# Patient Record
Sex: Female | Born: 1935
Health system: Southern US, Community
[De-identification: ages and names within clinical notes are randomized; demographics above are authoritative.]

## PROBLEM LIST (undated history)

## (undated) DIAGNOSIS — J449 Chronic obstructive pulmonary disease, unspecified: Secondary | ICD-10-CM

## (undated) DIAGNOSIS — I10 Essential (primary) hypertension: Secondary | ICD-10-CM

## (undated) DIAGNOSIS — J398 Other specified diseases of upper respiratory tract: Secondary | ICD-10-CM

## (undated) DIAGNOSIS — G4733 Obstructive sleep apnea (adult) (pediatric): Secondary | ICD-10-CM

## (undated) DIAGNOSIS — D649 Anemia, unspecified: Secondary | ICD-10-CM

## (undated) DIAGNOSIS — E669 Obesity, unspecified: Secondary | ICD-10-CM

## (undated) DIAGNOSIS — I209 Angina pectoris, unspecified: Secondary | ICD-10-CM

## (undated) DIAGNOSIS — K219 Gastro-esophageal reflux disease without esophagitis: Secondary | ICD-10-CM

## (undated) DIAGNOSIS — R319 Hematuria, unspecified: Secondary | ICD-10-CM

## (undated) DIAGNOSIS — M1990 Unspecified osteoarthritis, unspecified site: Secondary | ICD-10-CM

## (undated) DIAGNOSIS — I495 Sick sinus syndrome: Secondary | ICD-10-CM

## (undated) DIAGNOSIS — Z9289 Personal history of other medical treatment: Secondary | ICD-10-CM

## (undated) DIAGNOSIS — I480 Paroxysmal atrial fibrillation: Secondary | ICD-10-CM

## (undated) DIAGNOSIS — R300 Dysuria: Secondary | ICD-10-CM

## (undated) DIAGNOSIS — I639 Cerebral infarction, unspecified: Secondary | ICD-10-CM

## (undated) HISTORY — DX: Sick sinus syndrome: I49.5

## (undated) HISTORY — DX: Cerebral infarction, unspecified: I63.9

## (undated) HISTORY — DX: Chronic obstructive pulmonary disease, unspecified: J44.9

## (undated) HISTORY — PX: ATRIAL ABLATION SURGERY: SHX560

## (undated) HISTORY — DX: Gastro-esophageal reflux disease without esophagitis: K21.9

## (undated) HISTORY — DX: Paroxysmal atrial fibrillation: I48.0

## (undated) HISTORY — DX: Essential (primary) hypertension: I10

## (undated) HISTORY — DX: Obstructive sleep apnea (adult) (pediatric): G47.33

## (undated) HISTORY — DX: Personal history of other medical treatment: Z92.89

## (undated) HISTORY — PX: KNEE ARTHROSCOPY: SUR90

## (undated) HISTORY — DX: Obesity, unspecified: E66.9

## (undated) HISTORY — PX: ABDOMINAL HYSTERECTOMY: SHX81

---

## 2001-05-15 ENCOUNTER — Other Ambulatory Visit: Admission: RE | Admit: 2001-05-15 | Discharge: 2001-05-15 | Payer: Self-pay | Admitting: Family Medicine

## 2004-03-03 ENCOUNTER — Encounter (INDEPENDENT_AMBULATORY_CARE_PROVIDER_SITE_OTHER): Payer: Self-pay | Admitting: Cardiology

## 2004-03-03 ENCOUNTER — Observation Stay (HOSPITAL_COMMUNITY): Admission: EM | Admit: 2004-03-03 | Discharge: 2004-03-04 | Payer: Self-pay | Admitting: Emergency Medicine

## 2007-04-10 ENCOUNTER — Inpatient Hospital Stay (HOSPITAL_BASED_OUTPATIENT_CLINIC_OR_DEPARTMENT_OTHER): Admission: RE | Admit: 2007-04-10 | Discharge: 2007-04-10 | Payer: Self-pay | Admitting: Interventional Cardiology

## 2007-04-25 ENCOUNTER — Encounter: Admission: RE | Admit: 2007-04-25 | Discharge: 2007-04-25 | Payer: Self-pay | Admitting: Interventional Cardiology

## 2007-08-05 DIAGNOSIS — I639 Cerebral infarction, unspecified: Secondary | ICD-10-CM

## 2007-08-05 HISTORY — DX: Cerebral infarction, unspecified: I63.9

## 2007-09-02 ENCOUNTER — Ambulatory Visit: Payer: Self-pay | Admitting: Pulmonary Disease

## 2007-09-02 ENCOUNTER — Inpatient Hospital Stay (HOSPITAL_COMMUNITY): Admission: EM | Admit: 2007-09-02 | Discharge: 2007-09-09 | Payer: Self-pay | Admitting: Emergency Medicine

## 2007-09-03 ENCOUNTER — Encounter (INDEPENDENT_AMBULATORY_CARE_PROVIDER_SITE_OTHER): Payer: Self-pay | Admitting: Interventional Cardiology

## 2007-09-05 ENCOUNTER — Ambulatory Visit: Payer: Self-pay | Admitting: Physical Medicine & Rehabilitation

## 2008-12-27 ENCOUNTER — Encounter: Payer: Self-pay | Admitting: Emergency Medicine

## 2008-12-27 ENCOUNTER — Ambulatory Visit: Payer: Self-pay | Admitting: Diagnostic Radiology

## 2008-12-28 ENCOUNTER — Inpatient Hospital Stay (HOSPITAL_COMMUNITY): Admission: EM | Admit: 2008-12-28 | Discharge: 2008-12-29 | Payer: Self-pay | Admitting: Interventional Cardiology

## 2008-12-28 ENCOUNTER — Ambulatory Visit: Payer: Self-pay | Admitting: Cardiology

## 2009-03-06 DIAGNOSIS — I209 Angina pectoris, unspecified: Secondary | ICD-10-CM

## 2009-03-06 HISTORY — DX: Angina pectoris, unspecified: I20.9

## 2009-03-19 ENCOUNTER — Emergency Department (HOSPITAL_BASED_OUTPATIENT_CLINIC_OR_DEPARTMENT_OTHER): Admission: EM | Admit: 2009-03-19 | Discharge: 2009-03-20 | Payer: Self-pay | Admitting: Emergency Medicine

## 2009-03-20 ENCOUNTER — Encounter: Payer: Self-pay | Admitting: Internal Medicine

## 2009-04-03 ENCOUNTER — Ambulatory Visit: Payer: Self-pay | Admitting: Radiology

## 2009-04-03 ENCOUNTER — Encounter: Payer: Self-pay | Admitting: Internal Medicine

## 2009-04-03 ENCOUNTER — Encounter: Payer: Self-pay | Admitting: Emergency Medicine

## 2009-04-04 ENCOUNTER — Ambulatory Visit: Payer: Self-pay | Admitting: Cardiology

## 2009-04-05 ENCOUNTER — Encounter (INDEPENDENT_AMBULATORY_CARE_PROVIDER_SITE_OTHER): Payer: Self-pay | Admitting: Interventional Cardiology

## 2009-04-07 ENCOUNTER — Encounter: Payer: Self-pay | Admitting: Internal Medicine

## 2009-04-12 ENCOUNTER — Ambulatory Visit: Payer: Self-pay | Admitting: Diagnostic Radiology

## 2009-04-12 ENCOUNTER — Emergency Department (HOSPITAL_BASED_OUTPATIENT_CLINIC_OR_DEPARTMENT_OTHER): Admission: EM | Admit: 2009-04-12 | Discharge: 2009-04-12 | Payer: Self-pay | Admitting: Emergency Medicine

## 2009-04-13 ENCOUNTER — Encounter: Payer: Self-pay | Admitting: Internal Medicine

## 2009-05-05 ENCOUNTER — Encounter: Payer: Self-pay | Admitting: Internal Medicine

## 2009-05-14 ENCOUNTER — Encounter: Payer: Self-pay | Admitting: Internal Medicine

## 2009-05-15 ENCOUNTER — Encounter: Payer: Self-pay | Admitting: Internal Medicine

## 2009-06-03 ENCOUNTER — Encounter: Payer: Self-pay | Admitting: Internal Medicine

## 2009-06-17 ENCOUNTER — Ambulatory Visit: Payer: Self-pay | Admitting: Internal Medicine

## 2009-06-17 DIAGNOSIS — G4733 Obstructive sleep apnea (adult) (pediatric): Secondary | ICD-10-CM

## 2009-06-17 DIAGNOSIS — I4891 Unspecified atrial fibrillation: Secondary | ICD-10-CM | POA: Insufficient documentation

## 2009-06-17 DIAGNOSIS — I10 Essential (primary) hypertension: Secondary | ICD-10-CM

## 2009-06-23 ENCOUNTER — Encounter (INDEPENDENT_AMBULATORY_CARE_PROVIDER_SITE_OTHER): Payer: Self-pay | Admitting: Pharmacist

## 2009-06-30 ENCOUNTER — Encounter: Payer: Self-pay | Admitting: Internal Medicine

## 2009-07-01 ENCOUNTER — Encounter: Payer: Self-pay | Admitting: Internal Medicine

## 2009-07-03 ENCOUNTER — Encounter: Payer: Self-pay | Admitting: Internal Medicine

## 2009-07-07 ENCOUNTER — Encounter (INDEPENDENT_AMBULATORY_CARE_PROVIDER_SITE_OTHER): Payer: Self-pay | Admitting: Pharmacist

## 2009-07-13 ENCOUNTER — Ambulatory Visit: Payer: Self-pay | Admitting: Diagnostic Radiology

## 2009-07-13 ENCOUNTER — Emergency Department (HOSPITAL_BASED_OUTPATIENT_CLINIC_OR_DEPARTMENT_OTHER): Admission: EM | Admit: 2009-07-13 | Discharge: 2009-07-13 | Payer: Self-pay | Admitting: Emergency Medicine

## 2009-07-14 ENCOUNTER — Encounter: Payer: Self-pay | Admitting: Internal Medicine

## 2009-07-27 ENCOUNTER — Encounter: Payer: Self-pay | Admitting: Internal Medicine

## 2009-07-28 ENCOUNTER — Ambulatory Visit (HOSPITAL_COMMUNITY): Admission: RE | Admit: 2009-07-28 | Discharge: 2009-07-28 | Payer: Self-pay | Admitting: Interventional Cardiology

## 2009-07-28 ENCOUNTER — Encounter (INDEPENDENT_AMBULATORY_CARE_PROVIDER_SITE_OTHER): Payer: Self-pay | Admitting: Interventional Cardiology

## 2009-07-29 ENCOUNTER — Ambulatory Visit (HOSPITAL_COMMUNITY): Admission: RE | Admit: 2009-07-29 | Discharge: 2009-07-30 | Payer: Self-pay | Admitting: Internal Medicine

## 2009-07-29 ENCOUNTER — Ambulatory Visit: Payer: Self-pay | Admitting: Internal Medicine

## 2009-08-05 ENCOUNTER — Encounter: Payer: Self-pay | Admitting: Internal Medicine

## 2009-08-09 ENCOUNTER — Telehealth: Payer: Self-pay | Admitting: Internal Medicine

## 2009-08-15 ENCOUNTER — Encounter: Payer: Self-pay | Admitting: Emergency Medicine

## 2009-08-15 ENCOUNTER — Ambulatory Visit: Payer: Self-pay | Admitting: Diagnostic Radiology

## 2009-08-16 ENCOUNTER — Inpatient Hospital Stay (HOSPITAL_COMMUNITY): Admission: EM | Admit: 2009-08-16 | Discharge: 2009-08-17 | Payer: Self-pay | Admitting: Internal Medicine

## 2009-08-16 ENCOUNTER — Encounter (INDEPENDENT_AMBULATORY_CARE_PROVIDER_SITE_OTHER): Payer: Self-pay | Admitting: Internal Medicine

## 2009-08-31 ENCOUNTER — Ambulatory Visit: Payer: Self-pay | Admitting: Internal Medicine

## 2009-08-31 ENCOUNTER — Inpatient Hospital Stay (HOSPITAL_COMMUNITY): Admission: EM | Admit: 2009-08-31 | Discharge: 2009-09-02 | Payer: Self-pay | Admitting: Emergency Medicine

## 2009-08-31 ENCOUNTER — Ambulatory Visit: Payer: Self-pay | Admitting: Pulmonary Disease

## 2009-09-13 ENCOUNTER — Ambulatory Visit: Payer: Self-pay | Admitting: Internal Medicine

## 2009-09-13 DIAGNOSIS — R0602 Shortness of breath: Secondary | ICD-10-CM | POA: Insufficient documentation

## 2009-09-27 ENCOUNTER — Ambulatory Visit (HOSPITAL_COMMUNITY)
Admission: RE | Admit: 2009-09-27 | Discharge: 2009-09-27 | Payer: Self-pay | Source: Home / Self Care | Admitting: Internal Medicine

## 2009-09-27 ENCOUNTER — Encounter: Payer: Self-pay | Admitting: Internal Medicine

## 2009-10-06 ENCOUNTER — Ambulatory Visit: Payer: Self-pay | Admitting: Internal Medicine

## 2009-10-14 ENCOUNTER — Ambulatory Visit: Payer: Self-pay | Admitting: Internal Medicine

## 2009-10-21 ENCOUNTER — Telehealth: Payer: Self-pay | Admitting: Internal Medicine

## 2009-11-18 ENCOUNTER — Ambulatory Visit: Payer: Self-pay | Admitting: Internal Medicine

## 2009-12-24 ENCOUNTER — Ambulatory Visit: Payer: Self-pay | Admitting: Diagnostic Radiology

## 2009-12-24 ENCOUNTER — Encounter: Payer: Self-pay | Admitting: Emergency Medicine

## 2009-12-25 ENCOUNTER — Ambulatory Visit: Payer: Self-pay | Admitting: Cardiology

## 2009-12-25 ENCOUNTER — Inpatient Hospital Stay (HOSPITAL_COMMUNITY): Admission: EM | Admit: 2009-12-25 | Discharge: 2009-12-26 | Payer: Self-pay | Admitting: Internal Medicine

## 2010-02-10 ENCOUNTER — Inpatient Hospital Stay (HOSPITAL_COMMUNITY): Admission: RE | Admit: 2010-02-10 | Discharge: 2009-04-07 | Payer: Self-pay | Admitting: Interventional Cardiology

## 2010-03-16 ENCOUNTER — Ambulatory Visit
Admission: RE | Admit: 2010-03-16 | Discharge: 2010-03-16 | Payer: Self-pay | Source: Home / Self Care | Attending: Orthopedic Surgery | Admitting: Orthopedic Surgery

## 2010-03-21 LAB — APTT: aPTT: 48 s — ABNORMAL HIGH (ref 24–37)

## 2010-03-21 LAB — BASIC METABOLIC PANEL WITH GFR
BUN: 9 mg/dL (ref 6–23)
CO2: 29 meq/L (ref 19–32)
Calcium: 8.9 mg/dL (ref 8.4–10.5)
Chloride: 95 meq/L — ABNORMAL LOW (ref 96–112)
Creatinine, Ser: 0.9 mg/dL (ref 0.4–1.2)
GFR calc Af Amer: >60 mL/min (ref >60)
GFR calc non Af Amer: >60 mL/min (ref >60)
Glucose, Bld: 79 mg/dL (ref 70–99)
Potassium: 3.7 meq/L (ref 3.5–5.1)
Sodium: 134 meq/L — ABNORMAL LOW (ref 135–145)

## 2010-03-21 LAB — PROTIME-INR
INR: 1.51 — ABNORMAL HIGH (ref 0.00–1.49)
Prothrombin Time: 18.4 s — ABNORMAL HIGH (ref 11.6–15.2)

## 2010-03-21 LAB — POCT HEMOGLOBIN-HEMACUE: Hemoglobin: 13.4 g/dL (ref 12.0–15.0)

## 2010-03-25 NOTE — Op Note (Signed)
  [name], Lynn Dunn                ACCOUNT NO.:  [id number]  MEDICAL RECORD NO.:  [id number]          PATIENT TYPE:  AMB  LOCATION:  DSC                          FACILITY:  [facility]  PHYSICIAN:  Alta Corning, M.D.   DATE OF BIRTH:  Nov 12, 1930  DATE OF PROCEDURE:  03/16/2010 DATE OF DISCHARGE:                              OPERATIVE REPORT   PREOPERATIVE DIAGNOSIS:  Medial meniscal tear.  POSTOPERATIVE DIAGNOSES: 1. Medial meniscal tear. 2. Lateral meniscal tear. 3. Chondromalacia medial and lateral compartments. 4. Chondromalacia patellofemoral joint.  PROCEDURE: 1. Partial medial and partial lateral meniscectomy with corresponding     debridement of medial and lateral compartments. 2. Chondroplasty of patellofemoral joint down to bleeding bone.  SURGEON:  Alta Corning, MD  ASSISTANT:  Gary Fleet, PA  ANESTHESIA:  General.  BRIEF HISTORY:  [name] is a 77 year old female with a long history of having had significant complaints of right knee pain.  MRI was obtained, which showed she had a medial meniscal tear with radial components and complex components.  After failure of conservative care, activity modification, injection therapy, and medications, she was ultimately taken to the operating room for right knee arthroscopy.  PROCEDURE:  The patient was taken to the operating room and after adequate anesthesia was obtained with general anesthetic, the patient was placed supine on the operating table.  The right leg was then prepped and draped in usual sterile fashion.  Following this, routine arthroscopic examination of the knee revealed there was significant chondromalacia of patellofemoral joint, it was debrided back to a smooth and stable rim down to bleeding bone.  Attention was turned to the medial compartment which had a complex posterior midbody horn meniscal tear.  This was debrided back to a smooth stable rim.  There was chondromalacia in the tibial plateau  and medial femoral condyle, which were debrided.  Attention was turned laterally where there was significant meniscal tear of the anterior horn lateral meniscus, which was debrided allowing access into the lateral compartment, some mild chondromalacia in the lateral compartment was debrided as well.  The knee was copiously and thoroughly lavaged, suctioned dry.  All subportals closed with bandage.  Sterile compressive dressing was applied.  The patient was taken to the recovery room and was noted to be in satisfactory condition.  Estimated blood loss for the procedure was none.     Alta Corning, M.D.     [name]  D:  03/16/2010  T:  03/17/2010  Job:  [id number]  Electronically Signed by Dorna Leitz M.D. on 03/24/2010 08:59:38 PM

## 2010-04-05 NOTE — Letter (Signed)
Summary: ELectrophysiology/Ablation Procedure Instructions  [place] [place]  [id number] [place]   [place], [place] [address]   Phone: [phone]  Fax: [phone]     Electrophysiology/Ablation Procedure Instructions    You are scheduled for a(n) a fib ablation on 07/29/09 at 7:30AM with Dr. Rayann Heman.  1.  Please come to the [facility] at 5:30AM on the day of your procedure.  2.  Come prepared to stay overnight.   Please bring your insurance cards and a list of your medications.  3.  Come to Dr. Patrici Ranks office on 07/22/09 for lab work.  You do not have to be fasting.  4.  Do not have anything to eat or drink after midnight the night before your procedure.  5.  Do NOT take these medications for the am of your procedure unless otherwise instructed:  Diltiazem.  All of your remaining medications may be taken with a small amount of water.  6.  Educational material received:  _____ Ablation   * Occasionally, EP studies and ablations can become lengthy.  Please make your family aware of this before your procedure starts.  Average time ranges from 2-8 hours for EP studies/ablations.  Your physician will locate your family after the procedure with the results.  * If you have any questions after you get home, please call the office at [phone].  Leonia Reader  Start having weekly INR's with Dr Maceo Pro next week and then all labs on 07/22/09  Dr Hassell Done office will call you to schedule TEE for 07/28/09

## 2010-04-05 NOTE — Letter (Signed)
[facility] Office Note  [facility] Office Note   Imported By: Sallee Provencal 06/23/2009 09:56:02  _____________________________________________________________________  External Attachment:    Type:   Image     Comment:   External Document

## 2010-04-05 NOTE — Progress Notes (Signed)
Summary: test results  Phone Note Call from Patient Call back at Home Phone [phone]   Caller: Patient Call For: ramaswamy Reason for Call: Lab or Test Results Summary of Call: Wants her test results. Initial call taken by: Netta Neat,  October 21, 2009 11:23 AM  Follow-up for Phone Call        Pt  advised per append of results and to stop cardizem. also placed a reminder for follow-up in 3 months.  [place] Bing CMA  October 21, 2009 3:02 PM

## 2010-04-05 NOTE — Progress Notes (Signed)
Summary: c/o swollen glands b/p issues  Phone Note Call from Patient Call back at Home Phone [phone]   Caller: Patient Reason for Call: Talk to Nurse Summary of Call: c/o glands swollen, hig b/p on yesterday 180/75 . ablation 5/26. Initial call taken by: Neil Crouch,  August 09, 2009 8:53 AM  Follow-up for Phone Call        has swollen glands in neck and groin.  The area in the groin is 3 inches above where the cath site is for the ablation.  Call Dr Valente David, RN, BSN  August 09, 2009 11:02 AM Follow-up by: Thompson Grayer, MD,  August 09, 2009 5:10 PM  Additional Follow-up for Phone Call Additional follow up Details #1::        she likely has a small hematoma at the cath site. This should gradually resolve. If she has worsening swelling/ pain at the groin site, then she should contact our office. Additional Follow-up by: Thompson Grayer, MD,  August 09, 2009 5:10 PM    Additional Follow-up for Phone Call Additional follow up Details #2::    keep an eye on area in groin and call me back if it does not resolve.  Saw NP at Dr Patrici Ranks office gave her an antibiotic for swollen glands in neck Theodosia Quay, RN, BSN  August 09, 2009 5:53 PM

## 2010-04-05 NOTE — Assessment & Plan Note (Signed)
Summary: NEP/ AFIB POSSIBLE ABLATION . PT HAS MEDICARE/ GD   Visit Type:  Initial Consult Referring Provider:  Dr Irish Lack Primary Provider:  Dr Maceo Pro  CC:  nep/afib possible ablation. Pt states she is feeling so/so. Pt states she is out of breath all the time and her BP is up and down.Marland Kitchen  History of Present Illness: Lynn Dunn is a pleasant 77 yo WF with a h/o paroxysmal atrial fibrillation who presents today for EP consultation.  She reports intially being diagnosed with atrial fibrillation 5 years ago.  She reports abrupt onset of "heart racing" with chest tightness and SOB.  Her symptoms persisted and she was taken to the ER where she was documented to have afib 03/03/2004.  She has tried atenolol and diltiazem.  She reports increasing frequency and duration of atrial fibrillation despite medical therapy.  She has been hospitalized 5 times for afib, including an embolic stroke [id number] which required thrombolytics.  She has subsequently been treated with coumadin for stroke prevention.  During her most recent hospitalization 1/11, she was initated on amiodarone.  She has had no further afib, but is felt to have worsening COPD.  She therefore wishes to stop amiodarone at this time.  Current Medications (verified): 1)  Atenolol-Chlorthalidone 50-25 Mg Tabs (Atenolol-Chlorthalidone) .... Take One Tablet Once Daily 2)  Aspirin 81 Mg Tabs (Aspirin) .... Once Daily 3)  Lansoprazole 30 Mg Cpdr (Lansoprazole) .... Take One Capsule Daily 4)  Benicar 20 Mg Tabs (Olmesartan Medoxomil) .... One Tab Daily 5)  Pravastatin Sodium 80 Mg Tabs (Pravastatin Sodium) .... Take One Tablet Once Daily 6)  Warfarin Sodium 4 Mg Tabs (Warfarin Sodium) .... Uad Followed By Dr Maceo Pro 7)  Coq-10 200 Mg Caps (Coenzyme Q10) .... Once Daily 8)  Diltiazem Hcl 120 Mg Tabs (Diltiazem Hcl) .... Take One Tablet Once Daily 9)  Albuterol Sulfate (2.5 Mg/26ml) 0.083% Nebu (Albuterol Sulfate) .... Use 3 Times Daily 10)  Pulmicort 0.25  Mg/52ml Susp (Budesonide) .... Use 2 Times Daily  Allergies (verified): 1)  ! Codeine  Past History:  Past Medical History:  Hypertension  Paroxysmal Atrial fibrillation   mild COPD  Gastroesophageal reflux disease  Obesity  Embolic stroke [id number] requiring thrombolytics  OSA on CPAP  denies h/o rheumatic fever  Past Surgical History: Hysterectomy 1976  Family History: Noncontributory for early coronary artery disease or other cardiovascular disease.  Breast Cancer and brain cancer in her family  Social History: The patient is married and lives in [place].  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  She has never smoked cigarettes.   No recent alcohol.  Denies drugs. Attends [employer].  Review of Systems       All systems are reviewed and negative except as listed in the HPI.  ALso chronic dry cough, + constipation,   Vital Signs:  Patient profile:   77 year old female Height:      62.5 inches Weight:      195 pounds BMI:     35.22 Pulse rate:   67 / minute Pulse rhythm:   regular BP sitting:   124 / 62  (left arm) Cuff size:   large  Vitals Entered By: Doug Sou CMA (June 17, 2009 10:17 AM)  Physical Exam  General:  overweight, NAD Head:  normocephalic and atraumatic Eyes:  PERRLA/EOM intact; conjunctiva and lids normal. Mouth:  Teeth, gums and palate normal. Oral mucosa normal. Neck:  Neck supple,  no JVD. No masses, thyromegaly or abnormal cervical nodes. Lungs:  Clear bilaterally to auscultation and percussion. Heart:  Non-displaced PMI, chest non-tender; regular rate and rhythm, S1, S2 without murmurs, rubs or gallops. Carotid upstroke normal, no bruit. Normal abdominal aortic size, no bruits. Femorals normal pulses, no bruits. Pedals normal pulses. No edema, no varicosities. Abdomen:  Bowel sounds positive; abdomen soft and non-tender without masses, organomegaly, or hernias noted. No  hepatosplenomegaly. Msk:  Back normal, normal gait. Muscle strength and tone normal. Pulses:  pulses normal in all 4 extremities Extremities:  No clubbing or cyanosis. Neurologic:  Alert and oriented x 3.  strength/sensation intact Skin:  Intact without lesions or rashes. Cervical Nodes:  no significant adenopathy Psych:  Normal affect.   Cardiac Cath  Procedure date:  04/10/2007  Findings:       IMPRESSION:   1. No significant coronary artery disease.   2. Essentially normal right heart pressures.   3. Normal left ventricular end diastolic pressure and ejection       fraction estimated at 55% with no regional wall motion       abnormalities.      RECOMMENDATIONS:  Continue aggressive medical therapy for high blood   pressure in this patient.  Will consider pulmonary function testing to   further evaluate her shortness of breath.               Jettie Booze, MD   CXR  Procedure date:  04/12/2009  Findings:       Findings: The lungs are relatively well-aerated and clear.  There   is no evidence of focal opacification, pleural effusion or   pneumothorax.    The heart is enlarged but stable in size.  Pulmonary vascularity is   at the upper limits of normal.  No acute osseous abnormalities are   seen.    IMPRESSION:   No acute cardiopulmonary process seen; stable cardiomegaly.    Read By:  Santa Lighter,  M.D.  Nuclear Study  Procedure date:  04/07/2009  Findings:       Findings:  Rest images demonstrate normal left ventricular cavity   size.    Stress images demonstrate no fixed or reversible defects to suggest   inducible ischemia.    Evaluation of wall motion demonstrates normal left ventricular wall   motion and thickening.    Ejection fraction is estimated at 64%.  End diastolic volume of 79   cc.  End systolic volume of  28 cc.    IMPRESSION:   1.  No fixed or reversible defects to suggest inducible ischemia.   2.  Normal left ventricular  wall motion and thickening.   3.  Normal ejection fraction.    Read By:  Areta Haber,  M.D.  CXR  Procedure date:  04/03/2009  Findings:       Findings: The heart is enlarged.  There are no infiltrates or   failure.  Bones unremarkable.  No hilar or mediastinal   abnormalities.    IMPRESSION:   No active disease and no change.  Cardiomegaly.    Read By:  Staci Righter,  M.D.  Echocardiogram  Procedure date:  04/05/2009  Findings:        Study Conclusions    - Left ventricle: The cavity size was normal. Systolic function was     normal. The estimated ejection fraction was in the range of 55% to     60%. Wall motion was normal; there  were no regional wall motion     abnormalities. Doppler parameters are consistent with abnormal     left ventricular relaxation (grade 1 diastolic dysfunction).     Doppler parameters are consistent with elevated mean left atrial     filling pressure.   - Aortic valve: Mild regurgitation.   - Mitral valve: Moderately calcified annulus. Mildly thickened     leaflets .   - Left atrium: The atrium was mildly dilated.   - Right ventricle: The cavity size was mildly dilated.   Impressions:    - When compared to prior echo 08/2007 - aortic regurgitation is     unchanged, EF is unchanged, MAC is unchanged. Left atrial size is     increased (3.4 to 4.6cm).   Transthoracic echocardiography. M-mode, complete 2D, spectral   Doppler, and color Doppler. Height: Height: 157.5cm. Height: 62in.   Weight: Weight: 87.2kg. Weight: 191.8lb. Body mass index: BMI:   35.2kg/m^2. Body surface area: BSA: 1.71m^2. Blood pressure: 140/75.   Patient status: Inpatient. Location: Echo laboratory.  Echocardiogram  Procedure date:  09/03/2007  Findings:        SUMMARY   -  Overall left ventricular systolic function was normal. Left         ventricular ejection fraction was estimated , range being 60         % to 65 %. There was no diagnostic evidence of left          ventricular regional wall motion abnormalities.   -  The aortic valve was mildly calcified. There was mildly reduced         aortic valve leaflet excursion. Findings were consistent with         mild aortic valve stenosis. There was mild aortic valvular         regurgitation. The mean transaortic valve gradient was 12         mmHg.   -  There was moderate mitral annular calcification, predominantly         involving the posterior leaflet.   -  The estimated peak pulmonary artery systolic pressure was mildly         increased.     ---------------------------------------------------------------    Prepared and Electronically Authenticated by    Casandra Doffing MD     EKG  Procedure date:  06/17/2009  Findings:      sinus rhythm 67 bpm, PR 208, QT 450  EKG  Procedure date:  03/20/2009  Findings:      afib, V rate 110  Impression & Recommendations:  Problem # 1:  ATRIAL FIBRILLATION (ICD-427.31) Pt has symptomatic atrial fibrillation.  She has failed medical therapy with diltiazem, atenolol, and amidarone.  Amiodarone was stopped due to COPD.  Therapeutic strategies for afib including medicine and ablation were discussed in detail with the patient today. Risk, benefits, and alternatives to EP study and radiofrequency ablation for afib were also discussed in detail today. These risks include but are not limited to stroke, bleeding, vascular damage, tamponade, perforation, damage to the esophagus, lungs, and other structures, pulmonary vein stenosis, worsening renal function, and death. The patient understands these risk and wishes to proceed.  The patient will be scheduled for afib ablation at the next available time.  She should continue coumadin for CHADS2 score of 4. We may consider multaq down the road if necessary.  Problem # 2:  ESSENTIAL HYPERTENSION, BENIGN (ICD-401.1) the importance of close BP control was discussed with pt today  Her updated medication list for  this problem includes:    Atenolol-chlorthalidone 50-25 Mg Tabs (Atenolol-chlorthalidone) .Marland Kitchen... Take one tablet once daily    Aspirin 81 Mg Tabs (Aspirin) ..... Once daily    Benicar 20 Mg Tabs (Olmesartan medoxomil) ..... One tab daily    Diltiazem Hcl 120 Mg Tabs (Diltiazem hcl) .Marland Kitchen... Take one tablet once daily  Problem # 3:  SLEEP APNEA (ICD-780.57) CPAP and weight loss encouraged

## 2010-04-05 NOTE — Letter (Signed)
Summary: CPST [employer] [facility] account executive Pulmonary  [place], [place] [address]   Phone: [phone]  Fax: [phone]     Patient's Name: Lynn Dunn Date of Birth: [birth year] MRN: [id number]  CPST  Choose test method and choice  a)_x__Bike - recommended by ATS/ACCP. Do at [facility] at Dr. Haroldine Laws [facility] - GET ABG pre and peak exercise. Please call Respo therapy [phone] prior to test b)___Treadmill - less preferred. Do at [facility] at Dr. Haroldine Laws lab or do at [facility] PFT lab  Choose one or more indication for test  INDICATIONS FOR CARDIOPULMONARY EXERCISE TESTING Evaluation of exercise tolerance ___x___ Determination of functional impairment or capacity (peak V? O2) __x____ Determination of exercise-limiting factors and pathophysiologic mechanisms  Evaluation of undiagnosed exercise intolerance _____ Assessing contribution of cardiac and pulmonary etiology in coexisting disease _____ Symptoms disproportionate to resting pulmonary and cardiac tests  _____Unexplained dyspnea when initial cardiopulmonary testing is nondiagnostic  Evaluation of patients with cardiovascular disease _____ Functional evaluation and prognosis in patients with heart failure _____ Selection for cardiac transplantation _____ Exercise prescription and monitoring response to exercise training for cardiac rehabilitation (special circumstances; i.e., pacemakers)  Evaluation of patients with respiratory disease _____ Functional impairment assessment (see specific clinical applications)  _____Chronic obstructive pulmonary disease Establishing exercise limitation(s) and assessing other potential contributing factors, especially occult heart disease (ischemia) ______Determination of magnitude of hypoxemia and for O2 prescription When objective determination of therapeutic intervention is necessary and not adequately addressed by standard pulmonary function testing  _____ Interstitial lung  diseases _____Detection of early (occult) gas exchange abnormalities _____Overall assessment/monitoring of pulmonary gas exchange _____Determination of magnitude of hypoxemia and for O2 prescription _____Determination of potential exercise-limiting factors _____Documentation of therapeutic response to potentially toxic therapy  ____ Pulmonary vascular disease (careful risk-benefit analysis required)  ____ Cystic fibrosis  ____ Exercise-induced bronchospasm  Specific clinical applications ____  Preoperative evaluation _____Lung resectional surgery _____Elderly patients undergoing major abdominal surgery _____Lung volume resectional surgery for emphysema (currently investigational)  ____ Exercise evaluation and prescription for pulmonary rehabilitation  ____ Evaluation for impairment-disability  ____ Evaluation for lung, heart-lung transplantation ____ Definition of abbreviation: V? O2______ -oxygen consumption.    Brand Males MD    [facility]

## 2010-04-05 NOTE — Letter (Signed)
Summary: CPST- R/O Contraindications  [facility]  [address], [place] [address]   Phone: [phone]  Fax: [phone]    Patient's Name: Lynn Dunn Date of Birth: Dec 13, 1930 MRN: [id number]  *********Rule out Contraindications**************** Absolute                                                                                                                           ___ Acute MI (3-5 Days)                                 ___ Unstable Angina                                          ___ Uncontrolled arrhythmias causing symptoms or hemodynamic compromise. ___ Syncope                                                     ___ Active endocarditis                                         ___ Acute Myocarditis/Pericarditis                        ___ Symptomatic severe aortic stenosis  ___ Acute Pulmonary embolus or pulmonary infarction                ___ Uncontrolled Heart Failure  ___ Thrombosis of lower extremitie ___ Suspected dissecting aneurysm ___ Uncontrolled Asthma                          ___ Pulmonary Edema                                        ___ RA desat @ rest<85%                                      ___ Repiratory Failure                                         ___ Acute noncardiopulmonary disorder that may affect exercise performance or be         aggravated by exercise (ie infection, renal failure,  thyrotoxicosis) .                               ___ Mental impairment leading to inabliity to cooperate   Relative ___ Left main coronary stenosis or its equivalent ___ Moderate stentoic valvular heart disease ___ Severe untreated arterial hypertension @ rest (<200 mmHg             [id number] Diastolic ___ Tachy/Brady Arrhythmias ___ High- degree artioventricular block ___ Hypertrophic cardiomyopathy ___ Significant pulmonary hypertension ___ Advanced or complicated pregnancy ___ Electrolyte abnormalities ___ Orthopedic impairment that  compromises exercise performance   Has had admites for hypertensive urgency and is s/p ablation for A Fib. Currently no contraindiation for CPST Brand Males MD    [facility]  Appended Document: CPST- R/O Contraindications Appt scheduled for 09/27/09 at 11:30 at [facility]. Pt aware of appt. and instructions.

## 2010-04-05 NOTE — Assessment & Plan Note (Signed)
Summary: HFU PER KATY- PT SAW YACOUB-//kp   Visit Type:  Hospital Follow-up Copy to:  Dr Irish Lack Primary Provider/Referring Provider:  Dr Maceo Pro - PMD, Dr Irish Lack - cards, Dr. Radford Pax - sleep,Dr. Chase Caller - Pulmonary, Dr. Lovena Le - EP  CC:  Pt here for hospital follow up.  History of Present Illness: IOV 09/13/2009: 77 year old obese female with OSA on CPAP (Dr Fransico Him), passive smoke exposure, with multiple admits since May 2011 (see spl past med hx). c/o Dyspnea. Insidious onset few years ago. Dyspnea started after she sustained a stroke but since has recovered from stroke. Since then very slowly progressive. Dyspnea brought on by exertional actiivties like walking, making bed, vacuming or when it is humid. Dyspnea relieved by rest. Dyspnea not made worse by pollen, dust. Does admit to associated mild intermittent wheezing for past 8-9 months. Denies associated chest pain, or change in chronic pedal edema, or change in usage of 2 pillow, paroxysmal nocturnal dyspnea. Denies diagnosis of asthma.   Of note, she admits to 20# weight in last 2 years and being on amidarone for 5-6 weeks in early part of 2011. She was  seen by Dr. Nelda Marseille of pulmonary in one of her June admissions for cardiac issues. Had CT chest in late June 2011 that showed some basal atelectasiss only. A spirometry done 04/05/2009 was reviewed by Dr. Nelda Marseille presumably. It showd reduced FVC as the only abnormality. DLCO was normal. Dyspnea was deemed secondary to obesity and deconditioning. She has now been referred here for further workup/management  Preventive Screening-Counseling & Management  Alcohol-Tobacco     Smoking Status: never     Smoking Cessation Counseling: no     Passive Smoke Exposure: yes     Tobacco Counseling: not indicated; no tobacco use     Passive Smoke Counseling: not indicated; no passive smoke exposure  Comments: parents smoked unfiltered cigs - exposed age [age] to age 26. And then husband smoked an  occ. pipe. She was only rarely exposted to this from age 81 through age 40.   Current Medications (verified): 1)  Metoprolol Tartrate 50 Mg Tabs (Metoprolol Tartrate) .... Take 1 Tablet By Mouth Two Times A Day 2)  Aspirin 81 Mg Tabs (Aspirin) .... Once Daily 3)  Lansoprazole 30 Mg Cpdr (Lansoprazole) .... Take One Capsule Daily 4)  Benicar 20 Mg Tabs (Olmesartan Medoxomil) .... One Tab Daily 5)  Pravastatin Sodium 80 Mg Tabs (Pravastatin Sodium) .... Take One Tablet Once Daily 6)  Warfarin Sodium 4 Mg Tabs (Warfarin Sodium) .... Uad Followed By Dr Maceo Pro 7)  Coq-10 200 Mg Caps (Coenzyme Q10) .... Once Daily 8)  Diltiazem Hcl 120 Mg Tabs (Diltiazem Hcl) .... Take 2 Tablet By Mouth Once A Day 9)  Albuterol Sulfate (2.5 Mg/40ml) 0.083% Nebu (Albuterol Sulfate) .... Use 1 Times Daily 10)  Pulmicort 0.25 Mg/78ml Susp (Budesonide) .... Use 2 Times Daily 11)  K-Tabs 10 Meq Cr-Tabs (Potassium Chloride) .... Take 1 Tablet By Mouth Once A Day 12)  Omega-3 Krill Oil 300 Mg Caps (Krill Oil) .... Take 1 Capsule By Mouth Once A Day 13)  Spiriva Handihaler 18 Mcg Caps (Tiotropium Bromide Monohydrate) .... Inhale Contents of One Capsule By Mouth Once Daily 14)  Furosemide 40 Mg Tabs (Furosemide) .... Take 1 Tablet By Mouth Once A Day 15)  Clonidine Hcl 0.1 Mg Tabs (Clonidine Hcl) .... As Needed 16)  Temazepam 15 Mg Caps (Temazepam) .... Take 1 Tab By Mouth At Bedtime As Needed  Allergies (  verified): 1)  ! Codeine  Past History:  Family History: Last updated: 06/17/2009 Noncontributory for early coronary artery disease or other cardiovascular disease.  Breast Cancer and brain cancer in her family  Social History: Last updated: 09/13/2009 The patient is married and lives in [place].  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  She has never smoked cigarettes.   No recent alcohol.  Denies drugs. Attends [employer]. Patient never smoked.   Risk  Factors: Smoking Status: never (09/13/2009) Passive Smoke Exposure: yes (09/13/2009)  Past Medical History: Reviewed history from 06/17/2009 and no changes required.  Hypertension  Paroxysmal Atrial fibrillation   mild COPD  Gastroesophageal reflux disease  Obesity  Embolic stroke [id number] requiring thrombolytics  OSA on CPAP  denies h/o rheumatic fever  Past Surgical History: Reviewed history from 06/17/2009 and no changes required. Hysterectomy 1976  Past Pulmonary History:  Pulmonary History: 3DATE OF ADMISSION:  08/31/2009   DATE OF DISCHARGE:  09/02/2009                               FINAL DIAGNOSES:   1. Atrial fibrillation.   2. Chronic Coumadin use.   3. Obesity.   4. Hypertension.   # DATE OF ADMISSION:  08/16/2009   DATE OF DISCHARGE:  08/17/2009        FINAL DISCHARGE DIAGNOSES:   1. Hypertensive urgency.   2. Chest tightness with negative cardiac enzymes.   3. Atrial fibrillation status post ablation.   4. History of cerebrovascular accident.   5. Hyperkalemia, resolved.   # DATE OF ADMISSION:  07/29/2009   DATE OF DISCHARGE:  07/30/2009                PROCEDURES:   1. Comprehensive EP study.   2. Coronary sinus pacing and recording.   3. A 3-D mapping of SVT.   4. Radiofrequency ablation of SVT.   5. Arterial blood pressure monitoring.   6. Intracardiac echocardiography.   7. Transseptal puncture.   8. Pulmonary vein venography.   9. Isoproterenol infusion.   10.Arrhythmia induction with pacing.      PRIMARY FINAL DISCHARGE DIAGNOSIS:  Paroxysmal atrial fibrillation.      SECONDARY DIAGNOSES:   1. Hyponatremia, sodium 130 prior to admission.   2. Dyslipidemia.   3. Hypertension.   4. Obesity.   5. Preserved left ventricular function with an ejection fraction of 55-       123456, grade 1 diastolic dysfunction, mild aortic regurgitation and       trivial mitral regurgitation by echocardiogram in February 2011.   6. Status post Myoview in February  2011, showing no scar or ischemia       and an ejection fraction of 64%.   7. Possible mild chronic obstructive pulmonary disease.   8. Gastroesophageal reflux disease.   9. Status post hysterectomy.   10.Allergy or intolerance to CODEINE.   11.Status post cardiac catheterization in 2009, for chest tightness       and dyspnea, showing no significant coronary artery disease and       essentially normal right heart pressures.   12.History of cerebrovascular accident status post tPA.   13.Status post tonsillectomy     Family History: Reviewed history from 06/17/2009 and no changes required. Noncontributory for early coronary artery disease or other cardiovascular disease.  Breast Cancer and brain cancer in her family  Social History: Reviewed history from 06/17/2009 and no changes required. The patient is married and lives in [place].  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  She has never smoked cigarettes.   No recent alcohol.  Denies drugs. Attends [employer]. Patient never smoked.  Smoking Status:  never Passive Smoke Exposure:  yes  Review of Systems       The patient complains of shortness of breath with activity, non-productive cough, chest pain, irregular heartbeats, headaches, hand/feet swelling, and joint stiffness or pain.  The patient denies shortness of breath at rest, productive cough, coughing up blood, acid heartburn, indigestion, loss of appetite, weight change, abdominal pain, difficulty swallowing, sore throat, tooth/dental problems, nasal congestion/difficulty breathing through nose, sneezing, itching, ear ache, anxiety, depression, rash, change in color of mucus, and fever.    Vital Signs:  Patient profile:   77 year old female Height:      62.5 inches Weight:      186.6 pounds BMI:     33.71 O2 Sat:      95 % on Room air Temp:     98.1 degrees F oral Pulse rate:   69 / minute BP sitting:   100 / 60  (left  arm) Cuff size:   large  Vitals Entered By: Iran Planas CMA (September 13, 2009 1:35 PM)  O2 Flow:  Room air CC: Pt here for hospital follow up Comments Medications reviewed with patient Verified contact number and pharmacy with patient Iran Planas CMA  September 13, 2009 1:35 PM    Physical Exam  General:  overweight, NAD Head:  normocephalic and atraumatic Eyes:  PERRLA/EOM intact; conjunctiva and lids normal. Mouth:  Teeth, gums and palate normal. Oral mucosa normal. Neck:  Neck supple, no JVD. No masses, thyromegaly or abnormal cervical nodes. Lungs:  Clear bilaterally to auscultation and percussion. Heart:  Non-displaced PMI, chest non-tender; regular rate and rhythm, S1, S2 without murmurs, rubs or gallops. Carotid upstroke normal, no bruit. Normal abdominal aortic size, no bruits. Femorals normal pulses, no bruits. Pedals normal pulses. No edema, no varicosities. Abdomen:  Bowel sounds positive; abdomen soft and non-tender without masses, organomegaly, or hernias noted. No hepatosplenomegaly. Msk:  Back normal, normal gait. Muscle strength and tone normal. Pulses:  pulses normal in all 4 extremities Extremities:  No clubbing or cyanosis. Neurologic:  Alert and oriented x 3.  strength/sensation intact Skin:  Intact without lesions or rashes. Cervical Nodes:  no significant adenopathy Psych:  Normal affect.   CT of Chest  Procedure date:  09/01/2009  Findings:       CT CHEST WITHOUT CONTRAST    Technique:  Multidetector CT imaging of the chest was performed   following the standard protocol without IV contrast.    Comparison: Previous examinations, including the chest radiographs   obtained yesterday and chest CTA dated 09/02/2007.    Findings: Mild bibasilar atelectasis.  No airspace consolidation   suspicious for pneumonia.  No masses or enlarged lymph nodes.   Dense mitral valve annulus calcifications and calcifications at the   root of the aorta.  Borderline  enlarged heart.  Unremarkable upper   abdomen.  Thoracic spine degenerative changes.    High-resolution images through the lungs demonstrate the changes   described above with no additional abnormalities seen.    IMPRESSION:    1.  Mild bibasilar atelectasis.   2.  No evidence of pneumonia or malignancy.   3.  Mild cardiomegaly.   4.  Dense mitral valve annulus calcifications and calcifications at   the root of the aorta.    Read By:  Gerald Stabs,  M.D.   Released By:  Iona Coach  Comments:      independenty reviewed  Impression & Recommendations:  Problem # 1:  SHORTNESS OF BREATH (SOB) (ICD-786.05) Assessment Unchanged  Unclear why she is dyspneic. Will get CPST  Orders: Est. Patient Level III [id number])  Medications Added to Medication List This Visit: 1)  Metoprolol Tartrate 50 Mg Tabs (Metoprolol tartrate) .... Take 1 tablet by mouth two times a day 2)  Diltiazem Hcl 120 Mg Tabs (Diltiazem hcl) .... Take 2 tablet by mouth once a day 3)  Albuterol Sulfate (2.5 Mg/55ml) 0.083% Nebu (Albuterol sulfate) .... Use 1 times daily 4)  K-tabs 10 Meq Cr-tabs (Potassium chloride) .... Take 1 tablet by mouth once a day 5)  Omega-3 Krill Oil 300 Mg Caps (Krill oil) .... Take 1 capsule by mouth once a day 6)  Spiriva Handihaler 18 Mcg Caps (Tiotropium bromide monohydrate) .... Inhale contents of one capsule by mouth once daily 7)  Furosemide 40 Mg Tabs (Furosemide) .... Take 1 tablet by mouth once a day 8)  Clonidine Hcl 0.1 Mg Tabs (Clonidine hcl) .... As needed 9)  Temazepam 15 Mg Caps (Temazepam) .... Take 1 tab by mouth at bedtime as needed  Patient Instructions: 1)  my nurse will call [facility] and get the results of your breathing test 2)  I will set up a bike CPST test to undersstand why you are short of breath 3)  REturn to see me after bike test   Immunization History:  Influenza Immunization History:    Influenza:  historical (12/07/2008)  Pneumovax  Immunization History:    Pneumovax:  historical (01/06/2008)

## 2010-04-05 NOTE — Assessment & Plan Note (Signed)
Summary: rov/apc   Visit Type:  Follow-up Copy to:  Dr Irish Lack Primary Provider/Referring Provider:  Dr Maceo Pro - PMD, Dr Irish Lack - cards, Dr. Radford Pax - sleep,Dr. Chase Caller - Pulmonary, Dr. Lovena Le - EP  CC:  Follow up after CPST.  Pt states breathing is "a little better" since last OV but relates this to the "fluid pill I'm taking."  Pt states she has a "continual dry cough" and does have chest tightness at times.  Denies wheezing.  Marland Kitchen  History of Present Illness: IOV 09/13/2009: 77 year old obese female with OSA on CPAP (Dr Fransico Him), passive smoke exposure, with multiple admits since May 2011 (see spl past med hx). c/o Dyspnea. Insidious onset few years ago. Dyspnea started after she sustained a stroke but since has recovered from stroke. Since then very slowly progressive. Dyspnea brought on by exertional actiivties like walking, making bed, vacuming or when it is humid. Dyspnea relieved by rest. Dyspnea not made worse by pollen, dust. Does admit to associated mild intermittent wheezing for past 8-9 months. Denies associated chest pain, or change in chronic pedal edema, or change in usage of 2 pillow, paroxysmal nocturnal dyspnea. Denies diagnosis of asthma.   Of note, she admits to 20# weight in last 2 years and being on amidarone for 5-6 weeks in early part of 2011. She was  seen by Dr. Nelda Marseille of pulmonary in one of her June admissions for cardiac issues. Had CT chest in late June 2011 that showed some basal atelectasiss only. A spirometry done 04/05/2009 was reviewed by Dr. Nelda Marseille presumably. It showd reduced FVC as the only abnormality. DLCO was normal. Dyspnea was deemed secondary to obesity and deconditioning. She has now been referred here for further workup/management  REC: CPST Test  OV 10/06/2009: Followu dyspnea. Here to review CPST. CPST done 09/23/2009. Formal result NA due to computer glitch. RAw data only available. IT shows good effort RER 1.16. VO2 max is low and 75% predicted.I do  not have the IBW correcteion to figure out if dyspnea is related to obesity. AT is 50% predicted Vo2 max. REsp mechanics look okay. No evidence of dead space ventilation. HR response poor. Heart Rate Reserve is 42 beats (she is on lopressor and cardizem) but O2 pulse a measure of stroke volume is normal. I do not have the exercise induced bronchospasm part. In terms of sypmts dyspnea is same. She is denying cough to me although she stated that to CMA  Preventive Screening-Counseling & Management  Alcohol-Tobacco     Smoking Status: never  Current Medications (verified): 1)  Metoprolol Tartrate 50 Mg Tabs (Metoprolol Tartrate) .... Take 1 Tablet By Mouth Two Times A Day 2)  Aspirin 81 Mg Tabs (Aspirin) .... Once Daily 3)  Lansoprazole 30 Mg Cpdr (Lansoprazole) .... Take One Capsule Daily 4)  Benicar 20 Mg Tabs (Olmesartan Medoxomil) .... One Tab Daily 5)  Pravastatin Sodium 80 Mg Tabs (Pravastatin Sodium) .... Take One Tablet Once Daily 6)  Warfarin Sodium 4 Mg Tabs (Warfarin Sodium) .... Uad Followed By Dr Maceo Pro 7)  Coq-10 200 Mg Caps (Coenzyme Q10) .... Once Daily 8)  Diltiazem Hcl 120 Mg Tabs (Diltiazem Hcl) .... Take 2 Tablet By Mouth Once A Day 9)  Albuterol Sulfate (2.5 Mg/78ml) 0.083% Nebu (Albuterol Sulfate) .... Use 1 Times Daily 10)  Pulmicort 0.25 Mg/78ml Susp (Budesonide) .... Use 2 Times Daily 11)  K-Tabs 10 Meq Cr-Tabs (Potassium Chloride) .... Take 1 Tablet By Mouth Once A Day 12)  Omega-3  Krill Oil 300 Mg Caps (Krill Oil) .... Take 1 Capsule By Mouth Once A Day 13)  Spiriva Handihaler 18 Mcg Caps (Tiotropium Bromide Monohydrate) .... Inhale Contents of One Capsule By Mouth Once Daily 14)  Furosemide 40 Mg Tabs (Furosemide) .... Take 1 Tablet By Mouth Once A Day 15)  Clonidine Hcl 0.1 Mg Tabs (Clonidine Hcl) .... As Needed 16)  Temazepam 15 Mg Caps (Temazepam) .... Take 1 Tab By Mouth At Bedtime As Needed  Allergies (verified): 1)  ! Codeine  Past History:  Family  History: Last updated: 06/17/2009 Noncontributory for early coronary artery disease or other cardiovascular disease.  Breast Cancer and brain cancer in her family  Social History: Last updated: 09/13/2009 The patient is married and lives in [place].  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  She has never smoked cigarettes.   No recent alcohol.  Denies drugs. Attends [employer]. Patient never smoked.   Risk Factors: Smoking Status: never (10/06/2009) Passive Smoke Exposure: yes (09/13/2009)  Past Medical History: Reviewed history from 06/17/2009 and no changes required.  Hypertension  Paroxysmal Atrial fibrillation   mild COPD  Gastroesophageal reflux disease  Obesity  Embolic stroke [id number] requiring thrombolytics  OSA on CPAP  denies h/o rheumatic fever  Past Surgical History: Reviewed history from 06/17/2009 and no changes required. Hysterectomy 1976  Past Pulmonary History:  Pulmonary History: 3DATE OF ADMISSION:  08/31/2009   DATE OF DISCHARGE:  09/02/2009                               FINAL DIAGNOSES:   1. Atrial fibrillation.   2. Chronic Coumadin use.   3. Obesity.   4. Hypertension.   # DATE OF ADMISSION:  08/16/2009   DATE OF DISCHARGE:  08/17/2009        FINAL DISCHARGE DIAGNOSES:   1. Hypertensive urgency.   2. Chest tightness with negative cardiac enzymes.   3. Atrial fibrillation status post ablation.   4. History of cerebrovascular accident.   5. Hyperkalemia, resolved.   # DATE OF ADMISSION:  07/29/2009   DATE OF DISCHARGE:  07/30/2009                PROCEDURES:   1. Comprehensive EP study.   2. Coronary sinus pacing and recording.   3. A 3-D mapping of SVT.   4. Radiofrequency ablation of SVT.   5. Arterial blood pressure monitoring.   6. Intracardiac echocardiography.   7. Transseptal puncture.   8. Pulmonary vein venography.   9. Isoproterenol infusion.   10.Arrhythmia induction with  pacing.      PRIMARY FINAL DISCHARGE DIAGNOSIS:  Paroxysmal atrial fibrillation.      SECONDARY DIAGNOSES:   1. Hyponatremia, sodium 130 prior to admission.   2. Dyslipidemia.   3. Hypertension.   4. Obesity.   5. Preserved left ventricular function with an ejection fraction of 55-       123456, grade 1 diastolic dysfunction, mild aortic regurgitation and       trivial mitral regurgitation by echocardiogram in February 2011.   6. Status post Myoview in February 2011, showing no scar or ischemia       and an ejection fraction of 64%.   7. Possible mild chronic obstructive pulmonary disease.   8. Gastroesophageal reflux disease.   9. Status post hysterectomy.   10.Allergy or intolerance to CODEINE.  11.Status post cardiac catheterization in 2009, for chest tightness       and dyspnea, showing no significant coronary artery disease and       essentially normal right heart pressures.   12.History of cerebrovascular accident status post tPA.   13.Status post tonsillectomy     Review of Systems       The patient complains of shortness of breath with activity, non-productive cough, chest pain, acid heartburn, indigestion, headaches, and hand/feet swelling.  The patient denies shortness of breath at rest, productive cough, coughing up blood, irregular heartbeats, loss of appetite, abdominal pain, difficulty swallowing, sore throat, tooth/dental problems, nasal congestion/difficulty breathing through nose, sneezing, itching, ear ache, anxiety, depression, joint stiffness or pain, rash, change in color of mucus, and fever.    Vital Signs:  Patient profile:   77 year old female Height:      62 inches Weight:      184.13 pounds BMI:     33.80 O2 Sat:      98 % on Room air Temp:     98.2 degrees F oral Pulse rate:   65 / minute BP sitting:   110 / 58  (left arm) Cuff size:   large  Vitals Entered By: Raymondo Band RN (October 06, 2009 1:59 PM)  O2 Flow:  Room air CC: Follow up after CPST.   Pt states breathing is "a little better" since last OV but relates this to the "fluid pill I'm taking."  Pt states she has a "continual dry cough" and does have chest tightness at times.  Denies wheezing.   Comments Medications reviewed with patient Daytime contact number verified with patient. Raymondo Band RN  October 06, 2009 2:01 PM    Physical Exam  General:  overweight, NAD Head:  normocephalic and atraumatic Eyes:  PERRLA/EOM intact; conjunctiva and lids normal. Mouth:  Teeth, gums and palate normal. Oral mucosa normal. Neck:  Neck supple, no JVD. No masses, thyromegaly or abnormal cervical nodes. Lungs:  Clear bilaterally to auscultation and percussion. Heart:  Non-displaced PMI, chest non-tender; regular rate and rhythm, S1, S2 without murmurs, rubs or gallops. Carotid upstroke normal, no bruit. Normal abdominal aortic size, no bruits. Femorals normal pulses, no bruits. Pedals normal pulses. No edema, no varicosities. Abdomen:  Bowel sounds positive; abdomen soft and non-tender without masses, organomegaly, or hernias noted. No hepatosplenomegaly. Msk:  Back normal, normal gait. Muscle strength and tone normal. Pulses:  pulses normal in all 4 extremities Extremities:  No clubbing or cyanosis. Neurologic:  Alert and oriented x 3.  strength/sensation intact Skin:  Intact without lesions or rashes. Cervical Nodes:  no significant adenopathy Psych:  Normal affect.   Impression & Recommendations:  Problem # 1:  SHORTNESS OF BREATH (SOB) (ICD-786.05) Assessment Unchanged  cpst suggesting circulatory limitation. Huge Heart Rate reserve.  Formal report is pending. I will forward message to Dr. Rayann Heman for input. I will call her in -2 weeks when formal report available.   Orders: Est. Patient Level I [id number])  Patient Instructions: 1)  i will call you in 1-2 weeks with full CPST report 2)  If you do not hear from me by then call back  Appended Document: rov/apc We could  consider stopping cardizem,  this may improve heart rate response. I am scheduled to see her in September and she follows with Dr Irish Lack as well.  I will evaluate further when I see her.  In the interim, feel free to stop cardizem.  Appended Document: rov/apc Jen: Please call patient and state that wtih computer glitch solved I Read the detailed CPST report and her shortness of breath is related to her weight, her being out of shape and possibly also her heart medications. I discussed this with Dr. Rayann Heman face to face on 10/14/2009 and per his 10/12/2009 note we thought we can stop cardizem and reassess. SO, pls tell her to stop cardizem. Also, plse ensure she is referred to pulmonary rehab. I will see her in 3 months ; pls give fu  Appended Document: rov/apc jen, dont know if you got prior message  Appended Document: rov/apc pt advised of results and to stop cardizem. A reminder has been placed to follow-up in 3 months.

## 2010-04-05 NOTE — Assessment & Plan Note (Signed)
Summary: eph/ gd   Visit Type:  Follow-up Referring Brenon Antosh:  Dr Irish Lack Primary Cyrilla Durkin:  Dr Maceo Pro - PMD, Dr Irish Lack - cards, Dr. Radford Pax - sleep,Dr. Chase Caller - Pulmonary, Dr. Lovena Le - EP   History of Present Illness: The patient presents today for routine electrophysiology followup. She reports doing well since her afib ablation.  She denies recurrent arrhythmias and is pleased with her results.  She continues to struggle with longstanding dypsnea.  She reports SOB which preceeded her afib ablation by 2 years.  Though she did not feel better with PVI, she also did not feel worse.  She notes that her SOB has improved significantly with lasix.  The patient denies symptoms of palpitations, chest pain, orthopnea, PND, lower extremity edema, dizziness, presyncope, syncope, or neurologic sequela. The patient is tolerating medications without difficulties and is otherwise without complaint today.   Current Medications (verified): 1)  Metoprolol Tartrate 50 Mg Tabs (Metoprolol Tartrate) .... Take 1 Tablet By Mouth Two Times A Day 2)  Aspirin 81 Mg Tabs (Aspirin) .... Once Daily 3)  Lansoprazole 30 Mg Cpdr (Lansoprazole) .... Take One Capsule Daily 4)  Benicar 20 Mg Tabs (Olmesartan Medoxomil) .... One Tab Daily 5)  Pravastatin Sodium 80 Mg Tabs (Pravastatin Sodium) .... Take One Tablet Once Daily 6)  Warfarin Sodium 4 Mg Tabs (Warfarin Sodium) .... Uad Followed By Dr Maceo Pro 7)  Coq-10 200 Mg Caps (Coenzyme Q10) .... Once Daily 8)  Diltiazem Hcl 120 Mg Tabs (Diltiazem Hcl) .... Take  1-2 Tablets By Mouth Once A Day 9)  Albuterol Sulfate (2.5 Mg/22ml) 0.083% Nebu (Albuterol Sulfate) .... Use 1 Times Daily 10)  Pulmicort 0.25 Mg/56ml Susp (Budesonide) .... Once Daily 11)  K-Tabs 10 Meq Cr-Tabs (Potassium Chloride) .... Take 1 Tablet By Mouth Once A Day 12)  Vitamin B-12 1000 Mcg Tabs (Cyanocobalamin) .... Once Daily 13)  Spiriva Handihaler 18 Mcg Caps (Tiotropium Bromide Monohydrate) .... Inhale  Contents of One Capsule By Mouth Once Daily 14)  Furosemide 40 Mg Tabs (Furosemide) .... Take 1 Tablet By Mouth Once A Day 15)  Clonidine Hcl 0.1 Mg Tabs (Clonidine Hcl) .... As Needed 16)  Temazepam 15 Mg Caps (Temazepam) .... Take 1 Tab By Mouth At Bedtime As Needed 17)  Ativan 1 Mg Tabs (Lorazepam) .... As Needed  Allergies: 1)  ! Codeine  Past History:  Past Medical History:  Hypertension  Paroxysmal Atrial fibrillation s/p PVI 5/11   mild COPD  Gastroesophageal reflux disease  Obesity  Embolic stroke [id number] requiring thrombolytics  OSA on CPAP  denies h/o rheumatic fever  Past Surgical History: Hysterectomy 1976 afib ablation 5/11  Vital Signs:  Patient profile:   77 year old female Height:      62 inches Weight:      180 pounds BMI:     33.04 Pulse rate:   67 / minute BP sitting:   92 / 52  (left arm)  Vitals Entered By: Margaretmary Bayley CMA (November 18, 2009 9:09 AM)  Physical Exam  General:  morbidly obese, NAD Head:  normocephalic and atraumatic Eyes:  PERRLA/EOM intact; conjunctiva and lids normal. Mouth:  Teeth, gums and palate normal. Oral mucosa normal. Neck:  supple, JVP 8cm Lungs:  Clear bilaterally to auscultation and percussion. Heart:  RRR, no m/r/g Abdomen:  Bowel sounds positive; abdomen soft and non-tender without masses, organomegaly, or hernias noted. No hepatosplenomegaly. Msk:  Back normal, normal gait. Muscle strength and tone normal. Pulses:  pulses normal in all 4 extremities  Extremities:  trace edema Neurologic:  Alert and oriented x 3. Skin:  Intact without lesions or rashes. Psych:  Normal affect.   EKG  Procedure date:  11/18/2009  Findings:      sinus rhythm 67 bpm, PR 246, otherwise normal ekg  Impression & Recommendations:  Problem # 1:  ATRIAL FIBRILLATION (ICD-427.31) doing well s/p afib ablation she is pleased with her results. given her CHADS2 score of 4 (CVA, HTN, and age>75), I would recommend that she stay on  coumadin or pradaxa longterm. I also made it very clear that if she continues to have INRs<2, she should switch to pradaxa.  She will consider pradaxa but is concerned about finances (for her, copay is $30/month).  She will stop cardizem.  Problem # 2:  ESSENTIAL HYPERTENSION, BENIGN (ICD-401.1) she will follow BP off cardizem and take only when BP is elevated  Problem # 3:  SHORTNESS OF BREATH (SOB) (ICD-786.05) per Dr Irish Lack and Dr Lynford Citizen  My suspicion for pulmonary vein stenosis is low as her SOB preceeded afib ablation by 2 years and did not worsen with PVI. We could consider cardiac CT to evaluate her pulmonary veins if her SOB worsens in the future.  Other Orders: EKG w/ Interpretation (93000)  Patient Instructions: 1)  Your physician recommends that you continue on your current medications as directed. Please refer to the Current Medication list given to you today. 2)  Your physician wants you to follow-up in: 6 months with DrAllred  You will receive a reminder letter in the mail two months in advance. If you don't receive a letter, please call our office to schedule the follow-up appointment.

## 2010-05-18 LAB — BASIC METABOLIC PANEL
BUN: 15 mg/dL (ref 6–23)
Calcium: 8.8 mg/dL (ref 8.4–10.5)
Calcium: 8.9 mg/dL (ref 8.4–10.5)
Chloride: 104 mEq/L (ref 96–112)
Chloride: 104 mEq/L (ref 96–112)
Creatinine, Ser: 0.98 mg/dL (ref 0.4–1.2)
Creatinine, Ser: 1.01 mg/dL (ref 0.4–1.2)
Creatinine, Ser: 1 mg/dL (ref 0.4–1.2)
GFR calc Af Amer: >60 mL/min (ref >60)
GFR calc Af Amer: >60 mL/min (ref >60)
GFR calc non Af Amer: 53 mL/min — ABNORMAL LOW (ref >60)
GFR calc non Af Amer: 55 mL/min — ABNORMAL LOW (ref >60)
GFR calc non Af Amer: 54 mL/min — ABNORMAL LOW (ref >60)
Sodium: 137 mEq/L (ref 135–145)

## 2010-05-18 LAB — CBC
HCT: 34.4 % — ABNORMAL LOW (ref 36.0–46.0)
Hemoglobin: 11.6 g/dL — ABNORMAL LOW (ref 12.0–15.0)
MCHC: 33 g/dL (ref 30.0–36.0)
MCV: 91.5 fL (ref 78.0–100.0)
Platelets: 277 10*3/uL (ref 150–400)
Platelets: 258 10*3/uL (ref 150–400)
RBC: 3.89 MIL/uL (ref 3.87–5.11)
RBC: 3.69 MIL/uL — ABNORMAL LOW (ref 3.87–5.11)
RDW: 15.5 % (ref 11.5–15.5)
WBC: 8.7 10*3/uL (ref 4.0–10.5)
WBC: 9 10*3/uL (ref 4.0–10.5)

## 2010-05-18 LAB — LIPID PANEL
LDL Cholesterol: 52 mg/dL (ref 0–99)
Total CHOL/HDL Ratio: 2.3 RATIO
VLDL: 18 mg/dL (ref 0–40)

## 2010-05-18 LAB — CARDIAC PANEL(CRET KIN+CKTOT+MB+TROPI)
CK, MB: 0.8 ng/mL (ref 0.3–4.0)
Relative Index: INVALID (ref 0.0–2.5)
Total CK: 29 U/L (ref 7–177)
Total CK: 31 U/L (ref 7–177)
Troponin I: <0.01 ng/mL (ref 0.00–0.06)
Troponin I: <0.01 ng/mL (ref 0.00–0.06)

## 2010-05-18 LAB — POCT CARDIAC MARKERS
CKMB, poc: <1 ng/mL — ABNORMAL LOW (ref 1.0–8.0)
Myoglobin, poc: 43.2 ng/mL (ref 12–200)

## 2010-05-18 LAB — DIFFERENTIAL
Basophils Relative: 1 % (ref 0–1)
Lymphs Abs: 1.6 10*3/uL (ref 0.7–4.0)
Monocytes Relative: 6 % (ref 3–12)
Neutro Abs: 5.5 10*3/uL (ref 1.7–7.7)
Neutrophils Relative %: 72 % (ref 43–77)

## 2010-05-18 LAB — PROTIME-INR: Prothrombin Time: 21.2 seconds — ABNORMAL HIGH (ref 11.6–15.2)

## 2010-05-22 LAB — URINALYSIS, ROUTINE W REFLEX MICROSCOPIC
Nitrite: NEGATIVE
Protein, ur: NEGATIVE mg/dL
Specific Gravity, Urine: 1.006 (ref 1.005–1.030)
Urobilinogen, UA: 0.2 mg/dL (ref 0.0–1.0)

## 2010-05-22 LAB — CBC
HCT: 31.8 % — ABNORMAL LOW (ref 36.0–46.0)
HCT: 33 % — ABNORMAL LOW (ref 36.0–46.0)
HCT: 34.6 % — ABNORMAL LOW (ref 36.0–46.0)
Hemoglobin: 11.9 g/dL — ABNORMAL LOW (ref 12.0–15.0)
Hemoglobin: 10.9 g/dL — ABNORMAL LOW (ref 12.0–15.0)
Hemoglobin: 11.3 g/dL — ABNORMAL LOW (ref 12.0–15.0)
Hemoglobin: 11.9 g/dL — ABNORMAL LOW (ref 12.0–15.0)
MCHC: 34.9 g/dL (ref 30.0–36.0)
MCHC: 34.1 g/dL (ref 30.0–36.0)
MCHC: 34.3 g/dL (ref 30.0–36.0)
MCV: 94.7 fL (ref 78.0–100.0)
Platelets: 312 10*3/uL (ref 150–400)
Platelets: 277 10*3/uL (ref 150–400)
RBC: 3.65 MIL/uL — ABNORMAL LOW (ref 3.87–5.11)
RDW: 14.4 % (ref 11.5–15.5)
RDW: 15.1 % (ref 11.5–15.5)
RDW: 13.8 % (ref 11.5–15.5)
RDW: 16.6 % — ABNORMAL HIGH (ref 11.5–15.5)
WBC: 9.3 10*3/uL (ref 4.0–10.5)
WBC: 7 10*3/uL (ref 4.0–10.5)

## 2010-05-22 LAB — URINE CULTURE
Colony Count: NO GROWTH
Culture: NO GROWTH

## 2010-05-22 LAB — CARDIAC PANEL(CRET KIN+CKTOT+MB+TROPI)
CK, MB: 0.8 ng/mL (ref 0.3–4.0)
CK, MB: 0.9 ng/mL (ref 0.3–4.0)
Relative Index: INVALID (ref 0.0–2.5)
Relative Index: INVALID (ref 0.0–2.5)
Relative Index: INVALID (ref 0.0–2.5)
Total CK: 41 U/L (ref 7–177)
Total CK: 41 U/L (ref 7–177)
Total CK: 41 U/L (ref 7–177)
Total CK: 39 U/L (ref 7–177)
Total CK: 43 U/L (ref 7–177)
Troponin I: 0.01 ng/mL (ref 0.00–0.06)
Troponin I: 0.04 ng/mL (ref 0.00–0.06)

## 2010-05-22 LAB — DIFFERENTIAL
Basophils Absolute: 0.1 10*3/uL (ref 0.0–0.1)
Basophils Absolute: 0 10*3/uL (ref 0.0–0.1)
Basophils Relative: 1 % (ref 0–1)
Basophils Relative: 1 % (ref 0–1)
Lymphocytes Relative: 36 % (ref 12–46)
Lymphocytes Relative: 21 % (ref 12–46)
Monocytes Absolute: 0.8 10*3/uL (ref 0.1–1.0)
Monocytes Relative: 7 % (ref 3–12)
Neutro Abs: 5.3 10*3/uL (ref 1.7–7.7)
Neutro Abs: 4.9 10*3/uL (ref 1.7–7.7)
Neutrophils Relative %: 50 % (ref 43–77)
Neutrophils Relative %: 71 % (ref 43–77)

## 2010-05-22 LAB — PROTIME-INR
INR: 2.22 — ABNORMAL HIGH (ref 0.00–1.49)
INR: 2.02 — ABNORMAL HIGH (ref 0.00–1.49)
INR: 1.89 — ABNORMAL HIGH (ref 0.00–1.49)
INR: 2.19 — ABNORMAL HIGH (ref 0.00–1.49)
INR: 2.46 — ABNORMAL HIGH (ref 0.00–1.49)
Prothrombin Time: 24.4 seconds — ABNORMAL HIGH (ref 11.6–15.2)
Prothrombin Time: 22.7 seconds — ABNORMAL HIGH (ref 11.6–15.2)
Prothrombin Time: 26.5 seconds — ABNORMAL HIGH (ref 11.6–15.2)

## 2010-05-22 LAB — BASIC METABOLIC PANEL
BUN: 18 mg/dL (ref 6–23)
BUN: 19 mg/dL (ref 6–23)
CO2: 29 mEq/L (ref 19–32)
CO2: 30 mEq/L (ref 19–32)
Calcium: 8.8 mg/dL (ref 8.4–10.5)
Calcium: 8.9 mg/dL (ref 8.4–10.5)
Chloride: 93 mEq/L — ABNORMAL LOW (ref 96–112)
Chloride: 97 mEq/L (ref 96–112)
Chloride: 92 mEq/L — ABNORMAL LOW (ref 96–112)
Creatinine, Ser: 1 mg/dL (ref 0.4–1.2)
GFR calc Af Amer: >60 mL/min (ref >60)
GFR calc non Af Amer: 54 mL/min — ABNORMAL LOW (ref >60)
GFR calc non Af Amer: 56 mL/min — ABNORMAL LOW (ref >60)
Glucose, Bld: 126 mg/dL — ABNORMAL HIGH (ref 70–99)
Glucose, Bld: 110 mg/dL — ABNORMAL HIGH (ref 70–99)
Glucose, Bld: 118 mg/dL — ABNORMAL HIGH (ref 70–99)
Glucose, Bld: 117 mg/dL — ABNORMAL HIGH (ref 70–99)
Potassium: 3.1 mEq/L — ABNORMAL LOW (ref 3.5–5.1)
Potassium: 4.1 mEq/L (ref 3.5–5.1)
Potassium: 3.5 mEq/L (ref 3.5–5.1)
Sodium: 136 mEq/L (ref 135–145)
Sodium: 131 mEq/L — ABNORMAL LOW (ref 135–145)
Sodium: 132 mEq/L — ABNORMAL LOW (ref 135–145)
Sodium: 131 mEq/L — ABNORMAL LOW (ref 135–145)

## 2010-05-22 LAB — POCT I-STAT, CHEM 8
Hemoglobin: 12.2 g/dL (ref 12.0–15.0)
Potassium: 4.4 mEq/L (ref 3.5–5.1)
Sodium: 135 mEq/L (ref 135–145)
TCO2: 26 mmol/L (ref 0–100)

## 2010-05-22 LAB — HEPATIC FUNCTION PANEL
ALT: 16 U/L (ref 0–35)
AST: 19 U/L (ref 0–37)
Albumin: 3.5 g/dL (ref 3.5–5.2)
Bilirubin, Direct: 0.1 mg/dL (ref 0.0–0.3)
Total Protein: 5.9 g/dL — ABNORMAL LOW (ref 6.0–8.3)

## 2010-05-22 LAB — COMPREHENSIVE METABOLIC PANEL
ALT: 17 U/L (ref 0–35)
AST: 20 U/L (ref 0–37)
Alkaline Phosphatase: 63 U/L (ref 39–117)
CO2: 26 mEq/L (ref 19–32)
GFR calc Af Amer: >60 mL/min (ref >60)
GFR calc non Af Amer: >60 mL/min (ref >60)
Glucose, Bld: 132 mg/dL — ABNORMAL HIGH (ref 70–99)
Potassium: 4.2 mEq/L (ref 3.5–5.1)
Sodium: 138 mEq/L (ref 135–145)

## 2010-05-22 LAB — TROPONIN I: Troponin I: 0.05 ng/mL (ref 0.00–0.06)

## 2010-05-22 LAB — POCT CARDIAC MARKERS
CKMB, poc: <1 ng/mL — ABNORMAL LOW (ref 1.0–8.0)
CKMB, poc: <1 ng/mL — ABNORMAL LOW (ref 1.0–8.0)
Myoglobin, poc: 60.3 ng/mL (ref 12–200)
Troponin i, poc: <0.05 ng/mL (ref 0.00–0.09)
Troponin i, poc: <0.05 ng/mL (ref 0.00–0.09)

## 2010-05-22 LAB — BLOOD GAS, ARTERIAL
Acid-base deficit: 0.4 mmol/L (ref 0.0–2.0)
Bicarbonate: 23.3 mEq/L (ref 20.0–24.0)
Drawn by: 21283
O2 Saturation: 96.7 %
pCO2 arterial: 35.4 mmHg (ref 35.0–45.0)
pO2, Arterial: 80.6 mmHg (ref 80.0–100.0)

## 2010-05-22 LAB — APTT: aPTT: 37 seconds (ref 24–37)

## 2010-05-22 LAB — ANA: Anti Nuclear Antibody(ANA): NEGATIVE

## 2010-05-22 LAB — ANGIOTENSIN CONVERTING ENZYME: Angiotensin-Converting Enzyme: 15 U/L (ref 9–67)

## 2010-05-22 LAB — RHEUMATOID FACTOR: Rhuematoid fact SerPl-aCnc: <20 IU/mL (ref 0–20)

## 2010-05-22 LAB — ANTI-SCLERODERMA ANTIBODY: Scleroderma (Scl-70) (ENA) Antibody, IgG: <1 AU/mL (ref <30)

## 2010-05-23 LAB — URINALYSIS, ROUTINE W REFLEX MICROSCOPIC
Glucose, UA: 100 mg/dL — AB
Ketones, ur: NEGATIVE mg/dL
Protein, ur: NEGATIVE mg/dL

## 2010-05-23 LAB — CBC
HCT: 32.3 % — ABNORMAL LOW (ref 36.0–46.0)
Hemoglobin: 10.8 g/dL — ABNORMAL LOW (ref 12.0–15.0)
MCHC: 33.5 g/dL (ref 30.0–36.0)
MCHC: 34.1 g/dL (ref 30.0–36.0)
MCV: 92.7 fL (ref 78.0–100.0)
Platelets: 300 10*3/uL (ref 150–400)
Platelets: 365 10*3/uL (ref 150–400)
RDW: 16.1 % — ABNORMAL HIGH (ref 11.5–15.5)
RDW: 15.5 % (ref 11.5–15.5)

## 2010-05-23 LAB — BASIC METABOLIC PANEL
BUN: 17 mg/dL (ref 6–23)
CO2: 29 mEq/L (ref 19–32)
Calcium: 8.9 mg/dL (ref 8.4–10.5)
Chloride: 90 mEq/L — ABNORMAL LOW (ref 96–112)
Creatinine, Ser: 0.8 mg/dL (ref 0.4–1.2)
GFR calc Af Amer: >60 mL/min (ref >60)
GFR calc non Af Amer: 50 mL/min — ABNORMAL LOW (ref >60)
Glucose, Bld: 106 mg/dL — ABNORMAL HIGH (ref 70–99)
Sodium: 128 mEq/L — ABNORMAL LOW (ref 135–145)

## 2010-05-23 LAB — CARDIAC PANEL(CRET KIN+CKTOT+MB+TROPI)
CK, MB: 1.6 ng/mL (ref 0.3–4.0)
Relative Index: INVALID (ref 0.0–2.5)
Total CK: 60 U/L (ref 7–177)
Total CK: 64 U/L (ref 7–177)
Total CK: 67 U/L (ref 7–177)
Troponin I: 0.03 ng/mL (ref 0.00–0.06)

## 2010-05-23 LAB — PROTIME-INR
INR: 2.17 — ABNORMAL HIGH (ref 0.00–1.49)
INR: 1.87 — ABNORMAL HIGH (ref 0.00–1.49)
Prothrombin Time: 24 seconds — ABNORMAL HIGH (ref 11.6–15.2)
Prothrombin Time: 21.4 seconds — ABNORMAL HIGH (ref 11.6–15.2)

## 2010-05-23 LAB — DIFFERENTIAL
Eosinophils Absolute: 0.2 10*3/uL (ref 0.0–0.7)
Lymphocytes Relative: 27 % (ref 12–46)
Lymphs Abs: 1.8 10*3/uL (ref 0.7–4.0)
Monocytes Absolute: 0.7 10*3/uL (ref 0.1–1.0)
Monocytes Relative: 9 % (ref 3–12)
Neutro Abs: 4.1 10*3/uL (ref 1.7–7.7)
Neutrophils Relative %: 62 % (ref 43–77)
Neutrophils Relative %: 65 % (ref 43–77)

## 2010-05-23 LAB — T4: T4, Total: 10.5 ug/dL (ref 5.0–12.5)

## 2010-05-23 LAB — COMPREHENSIVE METABOLIC PANEL
Albumin: 4 g/dL (ref 3.5–5.2)
BUN: 11 mg/dL (ref 6–23)
Calcium: 9.1 mg/dL (ref 8.4–10.5)
Glucose, Bld: 114 mg/dL — ABNORMAL HIGH (ref 70–99)
Total Protein: 6.6 g/dL (ref 6.0–8.3)

## 2010-05-23 LAB — T3: T3, Total: 81.4 ng/dl (ref 80.0–204.0)

## 2010-05-23 LAB — MRSA PCR SCREENING: MRSA by PCR: NEGATIVE

## 2010-05-23 LAB — APTT: aPTT: 40 seconds — ABNORMAL HIGH (ref 24–37)

## 2010-05-24 LAB — BASIC METABOLIC PANEL
Chloride: 91 mEq/L — ABNORMAL LOW (ref 96–112)
Creatinine, Ser: 1.1 mg/dL (ref 0.4–1.2)
GFR calc Af Amer: 58 mL/min — ABNORMAL LOW (ref >60)
GFR calc non Af Amer: 48 mL/min — ABNORMAL LOW (ref >60)
Potassium: 3.3 mEq/L — ABNORMAL LOW (ref 3.5–5.1)

## 2010-05-24 LAB — CBC
HCT: 33.3 % — ABNORMAL LOW (ref 36.0–46.0)
MCV: 91.6 fL (ref 78.0–100.0)
RBC: 3.64 MIL/uL — ABNORMAL LOW (ref 3.87–5.11)
WBC: 9.7 10*3/uL (ref 4.0–10.5)

## 2010-05-24 LAB — DIFFERENTIAL
Eosinophils Absolute: 0.1 10*3/uL (ref 0.0–0.7)
Lymphocytes Relative: 19 % (ref 12–46)
Lymphs Abs: 1.8 10*3/uL (ref 0.7–4.0)
Monocytes Relative: 5 % (ref 3–12)
Neutrophils Relative %: 74 % (ref 43–77)

## 2010-05-24 LAB — POCT CARDIAC MARKERS
Myoglobin, poc: 53.7 ng/mL (ref 12–200)
Troponin i, poc: <0.05 ng/mL (ref 0.00–0.09)

## 2010-05-25 LAB — GLUCOSE, CAPILLARY: Glucose-Capillary: 158 mg/dL — ABNORMAL HIGH (ref 70–99)

## 2010-05-25 LAB — D-DIMER, QUANTITATIVE: D-Dimer, Quant: <0.22 ug/mL-FEU (ref 0.00–0.48)

## 2010-05-25 LAB — PROTIME-INR
INR: 1.98 — ABNORMAL HIGH (ref 0.00–1.49)
INR: 2.04 — ABNORMAL HIGH (ref 0.00–1.49)
Prothrombin Time: 22.3 seconds — ABNORMAL HIGH (ref 11.6–15.2)

## 2010-05-25 LAB — CBC
Platelets: 254 10*3/uL (ref 150–400)
RDW: 14.3 % (ref 11.5–15.5)
WBC: 7.3 10*3/uL (ref 4.0–10.5)

## 2010-05-26 LAB — POCT CARDIAC MARKERS
Myoglobin, poc: 46.2 ng/mL (ref 12–200)
Troponin i, poc: <0.05 ng/mL (ref 0.00–0.09)
Troponin i, poc: <0.05 ng/mL (ref 0.00–0.09)

## 2010-05-26 LAB — BASIC METABOLIC PANEL
BUN: 14 mg/dL (ref 6–23)
CO2: 29 mEq/L (ref 19–32)
Chloride: 91 mEq/L — ABNORMAL LOW (ref 96–112)
GFR calc Af Amer: >60 mL/min (ref >60)
Glucose, Bld: 102 mg/dL — ABNORMAL HIGH (ref 70–99)
Potassium: 5.5 mEq/L — ABNORMAL HIGH (ref 3.5–5.1)

## 2010-05-26 LAB — CBC
HCT: 34.2 % — ABNORMAL LOW (ref 36.0–46.0)
MCHC: 35.4 g/dL (ref 30.0–36.0)
MCV: 93.7 fL (ref 78.0–100.0)
Platelets: 324 10*3/uL (ref 150–400)
RDW: 13.5 % (ref 11.5–15.5)
WBC: 7.8 10*3/uL (ref 4.0–10.5)

## 2010-05-26 LAB — DIFFERENTIAL
Eosinophils Absolute: 0.3 10*3/uL (ref 0.0–0.7)
Eosinophils Relative: 3 % (ref 0–5)
Lymphs Abs: 2.9 10*3/uL (ref 0.7–4.0)

## 2010-05-26 LAB — PROTIME-INR: Prothrombin Time: 22.9 seconds — ABNORMAL HIGH (ref 11.6–15.2)

## 2010-06-08 ENCOUNTER — Other Ambulatory Visit (HOSPITAL_BASED_OUTPATIENT_CLINIC_OR_DEPARTMENT_OTHER): Payer: Self-pay | Admitting: Family Medicine

## 2010-06-08 DIAGNOSIS — R222 Localized swelling, mass and lump, trunk: Secondary | ICD-10-CM

## 2010-06-09 ENCOUNTER — Other Ambulatory Visit (HOSPITAL_BASED_OUTPATIENT_CLINIC_OR_DEPARTMENT_OTHER): Payer: Self-pay | Admitting: Family Medicine

## 2010-06-09 ENCOUNTER — Ambulatory Visit (HOSPITAL_BASED_OUTPATIENT_CLINIC_OR_DEPARTMENT_OTHER)
Admission: RE | Admit: 2010-06-09 | Discharge: 2010-06-09 | Disposition: A | Discharge status: Home / Self Care | Payer: Medicare Other | Source: Ambulatory Visit | Attending: Family Medicine | Admitting: Family Medicine

## 2010-06-09 DIAGNOSIS — R222 Localized swelling, mass and lump, trunk: Secondary | ICD-10-CM

## 2010-06-09 DIAGNOSIS — R1900 Intra-abdominal and pelvic swelling, mass and lump, unspecified site: Secondary | ICD-10-CM

## 2010-06-09 DIAGNOSIS — R1903 Right lower quadrant abdominal swelling, mass and lump: Secondary | ICD-10-CM | POA: Insufficient documentation

## 2010-06-09 LAB — COMPREHENSIVE METABOLIC PANEL WITH GFR
ALT: 17 U/L (ref 0–35)
AST: 21 U/L (ref 0–37)
Albumin: 3.6 g/dL (ref 3.5–5.2)
Alkaline Phosphatase: 47 U/L (ref 39–117)
BUN: 17 mg/dL (ref 6–23)
CO2: 28 meq/L (ref 19–32)
Calcium: 8.7 mg/dL (ref 8.4–10.5)
Chloride: 91 meq/L — ABNORMAL LOW (ref 96–112)
Creatinine, Ser: 0.79 mg/dL (ref 0.4–1.2)
GFR calc non Af Amer: >60 mL/min
Glucose, Bld: 110 mg/dL — ABNORMAL HIGH (ref 70–99)
Potassium: 3.5 meq/L (ref 3.5–5.1)
Sodium: 127 meq/L — ABNORMAL LOW (ref 135–145)
Total Bilirubin: 0.7 mg/dL (ref 0.3–1.2)
Total Protein: 6 g/dL (ref 6.0–8.3)

## 2010-06-09 LAB — BASIC METABOLIC PANEL
BUN: 16 mg/dL (ref 6–23)
BUN: 21 mg/dL (ref 6–23)
Calcium: 8.9 mg/dL (ref 8.4–10.5)
Calcium: 9.1 mg/dL (ref 8.4–10.5)
Creatinine, Ser: 0.9 mg/dL (ref 0.4–1.2)
GFR calc non Af Amer: 59 mL/min — ABNORMAL LOW (ref >60)
GFR calc non Af Amer: >60 mL/min (ref >60)
Glucose, Bld: 109 mg/dL — ABNORMAL HIGH (ref 70–99)

## 2010-06-09 LAB — METANEPHRINES, PLASMA
Metanephrine, Free: <25 pg/mL
Normetanephrine, Free: 37 pg/mL
Total Metanephrines-Plasma: 37 pg/mL

## 2010-06-09 LAB — CBC
HCT: 33.4 % — ABNORMAL LOW (ref 36.0–46.0)
Hemoglobin: 11.4 g/dL — ABNORMAL LOW (ref 12.0–15.0)
MCHC: 34 g/dL (ref 30.0–36.0)
MCV: 94.8 fL (ref 78.0–100.0)
MCV: 92.8 fL (ref 78.0–100.0)
Platelets: 247 10*3/uL (ref 150–400)
Platelets: 275 10*3/uL (ref 150–400)
RBC: 3.53 MIL/uL — ABNORMAL LOW (ref 3.87–5.11)
RDW: 16.2 % — ABNORMAL HIGH (ref 11.5–15.5)
WBC: 6.7 10*3/uL (ref 4.0–10.5)
WBC: 9 10*3/uL (ref 4.0–10.5)

## 2010-06-09 LAB — POCT CARDIAC MARKERS
CKMB, poc: <1 ng/mL — ABNORMAL LOW (ref 1.0–8.0)
Myoglobin, poc: 76.5 ng/mL (ref 12–200)
Troponin i, poc: <0.05 ng/mL (ref 0.00–0.09)

## 2010-06-09 LAB — DIFFERENTIAL
Eosinophils Absolute: 0.3 10*3/uL (ref 0.0–0.7)
Lymphocytes Relative: 27 % (ref 12–46)
Lymphs Abs: 2.4 10*3/uL (ref 0.7–4.0)
Neutrophils Relative %: 63 % (ref 43–77)

## 2010-06-09 LAB — URINALYSIS, ROUTINE W REFLEX MICROSCOPIC
Bilirubin Urine: NEGATIVE
Nitrite: NEGATIVE
Specific Gravity, Urine: 1.008 (ref 1.005–1.030)
Urobilinogen, UA: 0.2 mg/dL (ref 0.0–1.0)

## 2010-06-09 LAB — VMA + CREATININE, URINE (TIMED COLLECTION)

## 2010-06-09 LAB — CARDIAC PANEL(CRET KIN+CKTOT+MB+TROPI)
CK, MB: 0.8 ng/mL (ref 0.3–4.0)
CK, MB: 1.1 ng/mL (ref 0.3–4.0)
Relative Index: INVALID (ref 0.0–2.5)
Relative Index: INVALID (ref 0.0–2.5)
Relative Index: INVALID (ref 0.0–2.5)
Total CK: 46 U/L (ref 7–177)
Total CK: 47 U/L (ref 7–177)
Total CK: 48 U/L (ref 7–177)

## 2010-06-09 LAB — PROTIME-INR
INR: 2.46 — ABNORMAL HIGH (ref 0.00–1.49)
Prothrombin Time: 26.5 seconds — ABNORMAL HIGH (ref 11.6–15.2)

## 2010-06-09 LAB — URINE CULTURE

## 2010-06-24 ENCOUNTER — Encounter: Payer: Self-pay | Admitting: Internal Medicine

## 2010-06-27 ENCOUNTER — Ambulatory Visit (INDEPENDENT_AMBULATORY_CARE_PROVIDER_SITE_OTHER): Payer: Medicare Other | Admitting: Internal Medicine

## 2010-06-27 ENCOUNTER — Encounter: Payer: Self-pay | Admitting: Internal Medicine

## 2010-06-27 DIAGNOSIS — I4891 Unspecified atrial fibrillation: Secondary | ICD-10-CM

## 2010-06-27 DIAGNOSIS — G4730 Sleep apnea, unspecified: Secondary | ICD-10-CM

## 2010-06-27 DIAGNOSIS — I10 Essential (primary) hypertension: Secondary | ICD-10-CM

## 2010-06-27 NOTE — Assessment & Plan Note (Signed)
Doing very well s/p ablation without recurrence. She should continue coumadin longterm given elevated chads2 score (at least 4). We will taper cardizem to 180mg  daily, then reduce to 120mg  daily after 4 weeks.  IF she continues to do well, she may be able to stop cardizem upon follow-up with him.  I will see her again in 1 year.

## 2010-06-27 NOTE — Progress Notes (Signed)
The patient presents today for routine electrophysiology followup.  Since last being seen in our clinic, the patient reports doing very well.  She is unaware of any sypmtoms of afib.  She wore an event monitor by Dr Irish Lack for 30 days in December which revealed no afib.  She has begun exercising and has lost 20 lbs over the past year.  With weight loss, her chronic dypsnea has much improved.  Today, she denies symptoms of palpitations, chest pain, shortness of breath, orthopnea, PND, lower extremity edema, dizziness, presyncope, syncope, or neurologic sequela.  The patient feels that she is tolerating medications without difficulties and is otherwise without complaint today.   Past Medical History  Diagnosis Date  . HTN (hypertension)   . PAF (paroxysmal atrial fibrillation)     s/p ablation 07/29/09  . COPD (chronic obstructive pulmonary disease)   . GERD (gastroesophageal reflux disease)   . Obesity   . Stroke 6/09  . OSA (obstructive sleep apnea)   . H/O: hysterectomy    Past Surgical History  Procedure Date  . Atrial ablation surgery     s/p ablation for afib 07/29/09    Current Outpatient Prescriptions  Medication Sig Dispense Refill  . ALBUTEROL IN Inhale into the lungs as directed.        Marland Kitchen aspirin 81 MG tablet Take 81 mg by mouth daily.        Marland Kitchen atorvastatin (LIPITOR) 20 MG tablet Take 20 mg by mouth daily.        . cloNIDine (CATAPRES) 0.1 MG tablet Take 0.1 mg by mouth as needed.        . Coenzyme Q10 (COQ10) 200 MG CAPS 1 tab po qd       . diltiazem (DILACOR XR) 240 MG 24 hr capsule Take 240 mg by mouth daily.        . furosemide (LASIX) 40 MG tablet Take 40 mg by mouth daily.        . metoprolol (LOPRESSOR) 50 MG tablet Take 50 mg by mouth 2 (two) times daily.        . ranitidine (ZANTAC) 75 MG tablet Take 75 mg by mouth daily.        Marland Kitchen tiotropium (SPIRIVA) 18 MCG inhalation capsule Place 18 mcg into inhaler and inhale daily.        . vitamin B-12 (CYANOCOBALAMIN) 1000 MCG  tablet Take 1,000 mcg by mouth daily.        Marland Kitchen warfarin (COUMADIN) 4 MG tablet As directed       . DISCONTD: albuterol (PROVENTIL) (2.5 MG/3ML) 0.083% nebulizer solution Take 2.5 mg by nebulization every 6 (six) hours as needed.        Marland Kitchen DISCONTD: diltiazem (DILACOR XR) 120 MG 24 hr capsule Take 120 mg by mouth daily.        Marland Kitchen DISCONTD: lansoprazole (PREVACID) 30 MG capsule Take 30 mg by mouth daily.        Marland Kitchen DISCONTD: LORazepam (ATIVAN) 1 MG tablet Take 1 mg by mouth every 8 (eight) hours.        Marland Kitchen DISCONTD: olmesartan (BENICAR) 20 MG tablet Take 20 mg by mouth daily.        Marland Kitchen DISCONTD: pravastatin (PRAVACHOL) 80 MG tablet Take 80 mg by mouth daily.        Marland Kitchen DISCONTD: temazepam (RESTORIL) 15 MG capsule Take 15 mg by mouth at bedtime as needed.          Allergies  Allergen Reactions  .  Codeine     History   Social History  . Marital Status: Married    Spouse Name: N/A    Number of Children: N/A  . Years of Education: N/A   Occupational History  . Not on file.   Social History Main Topics  . Smoking status: Never Smoker   . Smokeless tobacco: Not on file  . Alcohol Use: No  . Drug Use: No  . Sexually Active: Not on file   Other Topics Concern  . Not on file   Social History Narrative  . No narrative on file    Family History  Problem Relation Age of Onset  . Breast cancer    . Brain cancer     Physical Exam: Filed Vitals:   06/27/10 1034  BP: 118/80  Pulse: 57  Height: 5\' 2"  (1.575 m)  Weight: 171 lb (77.565 kg)    GEN- The patient is well appearing, alert and oriented x 3 today.   Head- normocephalic, atraumatic Eyes-  Sclera clear, conjunctiva pink Ears- hearing intact Oropharynx- clear Neck- supple, no JVP Lymph- no cervical lymphadenopathy Lungs- Clear to ausculation bilaterally, normal work of breathing Heart- Regular rate and rhythm, no murmurs, rubs or gallops, PMI not laterally displaced GI- soft, NT, ND, + BS Extremities- no clubbing, cyanosis,  or edema MS- no significant deformity or atrophy Skin- no rash or lesion Psych- euthymic mood, full affect Neuro- strength and sensation are intact  EKG- sinus bradycardia 57 bpm, PR 220, anteroseptal scar  Assessment and Plan:

## 2010-06-27 NOTE — Assessment & Plan Note (Signed)
Per Dr Varanasi 

## 2010-06-27 NOTE — Patient Instructions (Signed)
Your physician wants you to follow-up in: 12 months with Dr Vallery Ridge will receive a reminder letter in the mail two months in advance. If you don't receive a letter, please call our office to schedule the follow-up appointment.  Your physician has recommended you make the following change in your medication: decreae Diltiazem to 180mg  daily for one month, then decrease to 120mg  daily

## 2010-06-27 NOTE — Assessment & Plan Note (Signed)
Controlled She will monitor BP as we reduce cardizem

## 2010-07-19 NOTE — H&P (Signed)
NAMEKERRE, MONGE NO.:  [id number]   MEDICAL RECORD NO.:  [id number]          PATIENT TYPE:  INP   LOCATION:  3112                         FACILITY:  [facility]   PHYSICIAN:  Jettie Booze, MDDATE OF BIRTH:  [birth year]   DATE OF ADMISSION:  09/02/2007  DATE OF DISCHARGE:                              HISTORY & PHYSICAL   CHIEF COMPLAINT:  Palpitations, chest discomfort.   HISTORY OF PRESENT ILLNESS:  Lynn Dunn is a pleasant 77 year old  female patient of Dr. Casandra Doffing, who presents this morning with  midsternal chest discomfort radiating to the back.  States the pain is 7  on a scale of 1 to 10.  Initially when her pain began, she had  palpitations.  She took her pulse rate at that time and it was 140.  She  had some fatigue with this, but no dizziness or syncope.  The rapid  heart rate resolved after approximately 20 minutes, but she continued to  have back discomfort when she presented to the office.  She has had some  shortness of breath, the pain is not worse with activity.  In the  office, she was given some nitroglycerin spray and about 5 minutes after  this, began experiencing worsening shortness of breath.  Skin became  clammy, and her systolic blood pressures dropped to the 90s.  She did  have an EKG in the office that showed new Q waves in leads III and aVF.  She is being admitted to rule out acute coronary syndrome versus other.   REVIEW OF SYSTEMS:  GENERAL:  Has felt well up until today.  No recent  fever or chills.  No weight loss or gain.  HEENT:  No problems with  vision, hearing loss.  Denies nasal obstruction, sore throat.  NECK:  No  stiffness.  CHEST:  As above.  Discomfort is not pleuritic.  No  palpitations until the episode this morning.  RESPIRATORY:  Has had some  increased shortness of breath since this morning.  Denies cough,  wheezing.  ABDOMEN:  No nausea, vomiting, abdominal pain, problems with  stools.  GU:  Denies  urinary infection symptoms.  EXTREMITIES:  Denies  any edema, weakness, joint deformity, or swelling.  NEURO:  No numbness,  tingling, headache, memory loss, dizziness, lightheadedness, syncope,  presyncope.   PAST MEDICAL HISTORY:  1. Hypertension, good control.  2. No significant CAD by CATH, April 20, 2007.  3. Past history of atrial fib.  4. Mild COPD.  5. GERD.   PAST SURGICAL HISTORY:  1. Tonsillectomy.  2. Hysterectomy.   ALLERGIES:  CODEINE.   CURRENT MEDICATIONS:  1. Estradiol 1 mg half a tablet daily.  2. Atenolol/chlorthalidone 50/25 mg one a day.  3. Zantac 150 mg a day.  4. Multivitamin daily.  5. Aspirin 325 mg daily.  6. Spiriva 1 puff daily.  7. Prevacid 30 mg daily.  8. Lescol 20 mg daily.   SOCIAL HISTORY:  Married.  No smoking.  Occasional alcohol.  No illegal  drugs.   FAMILY HISTORY:  Family members profoundly died with  cancer in the past.  Hypertension does run in the family.   OBJECTIVE DATA:  VITAL SIGNS:  Weight 186, stable, pulse is 80, blood  pressure 110/64 initially, then dropped to 90/60.  O2 sats 99% on 2 L of  O2.  NECK:  No JVD.  No carotid bruits auscultated.  CHEST:  Good excursion, clear throughout.  CARDIOVASCULAR:  Normal S1 and S2 with grade 2/6 systolic murmur,  unchanged.  ABDOMEN:  Soft, nondistended.  Bowel sounds present in all 4 quadrants.  No abdominal bruits auscultated.  No masses or hepatosplenomegaly.  GU/RECTAL:  Deferred.  EXTREMITIES:  Reveal +4 and +2 radial and dorsalis pedal pulses.  There  is no lower extremity edema.   EKG reveals normal sinus rhythm, rate at 72 beats per minute.  She has a  new Q waves in III and aVF.  There is poor R-wave progression in the  anterior leads, but his is unchanged from prior.   IMPRESSION:  1. Chest pain syndrome, rule out myocardial infarction, dissection,      pulmonary embolism versus other.  2. Hypertension, good control.  3. Chronic obstructive pulmonary disease.   4. Palpitations: Patient will be on telemetry.  Has history of      paroxysmal atrial fibrillation.   PLAN:  Admit.  Obtain cardiac markers, STAT, BMET, CBC, PT, and PTT.  A  CT of the chest with contrast to rule out dissection, PE.  Make n.p.o.  for now.  Dr. Irish Lack to follow.      Tamera C. Bobby Rumpf, N.P.       Jettie Booze, MD  Electronically Signed    TCL/MEDQ  D:  09/02/2007  T:  09/03/2007  Job:  [id number]

## 2010-07-19 NOTE — Discharge Summary (Signed)
NAMEYULIETH, FOOSHEE NO.:  [id number]   MEDICAL RECORD NO.:  [id number]          PATIENT TYPE:  INP   LOCATION:  3707                         FACILITY:  [facility]   PHYSICIAN:  Jettie Booze, MDDATE OF BIRTH:  10/31/1930   DATE OF ADMISSION:  09/02/2007  DATE OF DISCHARGE:  09/09/2007                               DISCHARGE SUMMARY   DISCHARGE DIAGNOSES:  1. Chest pain, resolved.  2. 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  3. Paroxysmal atrial fibrillation, currently normal sinus rhythm.  4. Long term Coumadin use.   HOSPITAL COURSE:  Ms. Urrabazo is a 77 year old female who was initially  admitted to [facility] on September 02, 2007, with midsternal chest pain.   Of note, she had no significant coronary artery disease by cardiac  catheterization on April 10, 2007.   On the same date of admission, she had a CT angiogram of the chest,  abdomen, and pelvis, all of these were unremarkable.   At 6 p.m. on the night of admission, the nurse observed some speech  changes with right hemiparesis and a code stroke was called.  The  patient was found to have mild right hemiparesis, global aphasia with  mutism, and a right homonymous visual field deficit.  The NIH stroke  score was 13.  Ultimately, she was found to have a left completed MCA  occlusion and required ultimately tPA therapy.  In the end, she was  maintained on Coumadin without the use of Lovenox or Plavix in the  interim.   She was followed very closely by the stroke team who were just managing  her care.  She was also seen in consultation by the speech therapy,  occupational therapy, and rehabilitation therapy.  She was not felt to  be an appropriate candidate for inpatient rehab because all of her  symptoms had abated.  By September 09, 2007, the patient was felt to be ready  for discharge to home.  Her INR on discharge was 2.1, with a prothrombin  time of  23.9.  INR goal would be between 2 and 3.0.   Otherwise, studies include a TSH of 2.140, hemoglobin A1c is 6.2, BUN  10, creatinine 0.4, potassium 4.4, hemoglobin 10.0, hematocrit 29.0,  white count 9.4, platelets 267, magnesium 2.1, phosphorous 4.3,  homocystine 10.4, total cholesterol 261, triglycerides 445, LDL  incalculable secondary to hypertriglyceridemia, HDL was 39.   Please note that the patient did have paroxysmal atrial fibrillation and  during her hospital stay this was controlled with IV Cardizem which was  subsequently changed over to oral dosing.   DISCHARGE MEDICATIONS:  1. Coumadin 5 mg a day.  2. Atenolol/chlorthalidone 1 tablet daily as prior to admission.  3. Prevacid 30 mg daily.  4. Spiriva once a day.  5. Lescol 20 mg a day.  6. Cardizem CD 180 mg a day.   DIET:  Remain on low-sodium diet.   ACTIVITIES:  Increase activity slowly.   FOLLOWUP:  Follow up with [facility] on  September 12, 2007, at 10:15 a.m.  for visit and blood work including protime/INR and followup with Dr.  Irish Lack on September 18, 2007, at 2 p.m.  The patient wishes to continue  having her protimes drawn at [facility] due to the proximity of her  home.      Joesphine Bare, P.A.       Jettie Booze, MD  Electronically Signed    LB/MEDQ  D:  09/09/2007  T:  09/10/2007  Job:  [id number]   cc:   [name], M.D.  Jill Alexanders, M.D.

## 2010-07-19 NOTE — Consult Note (Signed)
NAMEMarland Kitchen  JENISE, WESTFALL NO.:  [id number]   MEDICAL RECORD NO.:  [id number]          PATIENT TYPE:  INP   LOCATION:  3112                         FACILITY:  [facility]   PHYSICIAN:  Jill Alexanders, M.D.  DATE OF BIRTH:  [birth year]   DATE OF CONSULTATION:  09/02/2007  DATE OF DISCHARGE:                                 CONSULTATION   HISTORY:  This patient is a 77 year old white female born [birth year] with a history of chest pain for a catheterization done in February  2009, it was unremarkable.  The patient comes back into the hospital  with recurring chest pain radiating to the back.  The patient was  admitted for evaluation of chest pain with radiation to the back, and  for suspected aortic dissection.  Her CT angiogram was done today and a  chest, abdomen, and pelvis; all these were unremarkable.  At 6 p.m.  tonight,  the patient was in the room  The nurse observed some speech  changes with right hemiparesis.  At 6 PM, a code stroke was called.  The  patient was found to have a mild right hemiparesis, global aphasia with  mutism, and a right homonymous visual field deficit.  The NIH stroke  score was 13.   PAST MEDICAL HISTORY:  Significant for;  1. History of new onset stroke event with global aphasia and right      hemiparesis  2. Admission for  chest pain with significant pain,  3. Obesity.  4. Hypertension.  5. Atrial fibrillation in the past in 2001.  6. Tonsillectomy.  7. Hysterectomy.   MEDICATIONS:  Include;  1. Aspirin 325 mg daily.  2. Tenormin 50 mg daily.  3. Chlorthalidone 25 mg daily.  4. Docusate 200 mg daily.  5. Estradiol 0.5 mg daily.  6. Nitroglycerin drip.  7. Protonix 40 mg daily.  8. Zocor 10 mg daily.  9. Spiriva 18 mcg inhaler daily.  10.Tylenol as needed.  11.Morphine as needed.  12.Senokot as needed.   ALLERGIES:  The patient again is allergic to CODEINE.   SOCIAL HISTORY:  Smoking or drinking history is unknown.  This  patient  is married, lives in the [place], [place] area.  Lives with her  husband.  The patient is retired.   Family medical history and review systems are not available.  on prior  records and kindly  obtain record  for the patient.   PHYSICAL EXAMINATION:  VITAL SIGNS:  Blood pressure is 112/67, heart  rate 69, pulse rate 18, temperature is afebrile.  GENERAL: This patient is a mildly-to-moderately obese white female who  is alert and mute at the time of examination.  HEENT:  Head is atraumatic.  EYES:  Pupils are equal, round, and reactive to light.  Disks are flat  bilaterally.  NECK:  Supple. No carotid bruits noted.  RESPIRATORY:  Examination is Clear.  CARDIOVASCULAR:  Examination reveal a regular rate and rhythm.  ABDOMEN:  Bowel sounds noted.  EXTREMITIES:  Without significant edema.  NEUROLOGIC:  Cranial nerves as above.  Facial  symmetry is not present.  The patient has depression of the nasolabial fold on the right as  compared to the left.  The patient is able to generate full lateral hand  movements.  Blinks more briskly from the left than she does from the  right with the eyes at mid position not deviated to one side.  The  patient will not follow pure verbal commands and will not attempt to  speak.  The patient has no drift with the upper extremities has good  strength on both arms.  The patient drifts with right leg out of the bed  but does not drift with the left leg.  The patient appears to response  symmetrically to deep pain stimulation on all force and on the face.  The patient would not follow commands well for cerebellar testing but no  obvious ataxia is seen.  Deep tendon reflexes relatively symmetric.  The  patient appears to have an upgoing toe on the right and neutral on the  left.   LABORATORY VALUES:  Notable for a sodium of 131, potassium 3.4, chloride  of 94, CO2 of 31, glucose of 125, BUN of 17, creatinine 1.1, total  bilirubin 0.06,  alkaline phosphatase 51, SGOT of 20, SGPT of 18, total  protein 6.4, albumin of 3.9, calcium 8.8, and magnesium 2.2.  Troponin I  0.02.  CK level of 37.  White count of 7.7, hemoglobin of 12.8,  hematocrit 37.5, MCV of 92.3, platelets of 339, INR 0.9.  CT of the head is as above.  EKG during this hospitalization reveals a  rate of 72, with normal sinus rhythm, anteior infarct,age undetermined.   IMPRESSION:  1. New onset of global aphasia and mild right hemiparesis, rule out      embolic stroke to the left brain.  2. Hypertension.  3. History of chest pain without any evidence of coronary artery      disease.   This patient is alert, but remains mute, globally aphasic.  NIH stroke  scale score is 13.   Will plan 2/3 intraveinous tPA with diagnostic angiogram.   PLAN:  1. The patient will be transferred to 3100 unit.  2. Angiogram this evening.  3. 2/3  tPA.  4. Monitor the patient in intensive care unit.  Will follow stroke      admission protocols.      Jill Alexanders, M.D.  Electronically Signed     CKW/MEDQ  D:  09/02/2007  T:  09/03/2007  Job:  [id number]   cc:   [facility]  Soni Dr. Marliss Coots

## 2010-07-19 NOTE — Cardiovascular Report (Signed)
NAME:  Lynn Dunn, Lynn Dunn NO.:  [id number]   MEDICAL RECORD NO.:  [id number]          PATIENT TYPE:  OIB   LOCATION:  1962                         FACILITY:  [facility]   PHYSICIAN:  Jettie Booze, MDDATE OF BIRTH:  05-29-1930   DATE OF PROCEDURE:  04/10/2007  DATE OF DISCHARGE:                            CARDIAC CATHETERIZATION   REFERRING PHYSICIAN:  [name], M.D.   PROCEDURE PERFORMED:  Left heart catheterization, left ventriculogram,  coronary angiogram, abdominal aortogram, right heart catheterization.   INDICATIONS:  Chest tightness, shortness of breath.   OPERATOR:  Jettie Booze, M.D.   PROCEDURE NOTE:  The risks and benefits of right and left heart  catheterization were explained to the patient.  Informed consent was  obtained.  The patient was brought to the cath lab.  She was prepped and  draped in the usual sterile fashion.  Her right groin was infiltrated  with 1% lidocaine.  A 7-French venous sheath was placed into the right  femoral vein using the modified Seldinger technique.  A 4-French  arterial sheath was placed into the right femoral artery using the  modified Seldinger technique.  Right heart catheterization was  performed, and hemodynamic pressure assessments were obtained.  Left  coronary artery angiography was then performed using a JL-5.0 catheter.  The catheter was advanced to the vessel ostium under fluoroscopic  guidance.  Digital angiography was performed in multiple projections  using hand injection of contrast.  Right coronary artery angiography was  then performed using a 3DRC catheter.  The catheter was advanced to the  vessel ostium under fluoroscopic guidance.  Digital angiography was  performed in multiple projections using hand injection of contrast.  A  pigtail catheter was advanced to the ascending aorta and across the  aortic valve.  Power injection of contrast was performed in the AP  projection to image the  left ventricle.  The catheter was pulled back  under continuous hemodynamic pressure monitoring.  The catheter was  pulled back to the abdominal aorta, and a power injection of contrast  was performed in the AP projection to image the abdominal aorta.  The  sheaths were removed using manual compression.   FINDINGS:  Right heart catheterization:  Right atrial pressure 8/5 with  a mean right atrial pressure of 4 mmHg.  RV pressure of 32/3 with an  RVEDP of 6 mmHg.  PA pressure of 31/11 with a mean PA pressure of 19  mmHg,  pulmonary capillary wedge pressure of 16/13 with a mean wedge of  11 mmHg.  Aortic pressure 158/69 with a mean aortic pressure of 104,  left ventricular pressure of 160/8 with a left ventricular end-diastolic  pressure of 12 mmHg.  The PA sat was 67%, aortic sat 94%.  By Kathlen Brunswick,  the  cardiac output was 3.5 with a cardiac index of 1.9.   Coronary anatomy:  The left main was a short vessel and widely patent.  The left circumflex was a large vessel with mild irregularities  proximally.  The OM1 was a large branching vessel with mild  irregularities.  The  left anterior descending was a medium-sized vessel  which did go to the apex.  There was a large first diagonal with minor  irregularities.  The right coronary artery was a large dominant vessel,  large posterolateral branch of PDA, which were all angiographically  normal.  Aortogram showed single renal arteries bilaterally.  There was  no abdominal aortic aneurysm.  There was no pressure gradient between  the abdominal aorta and the right femoral artery.   IMPRESSION:  1. No significant coronary artery disease.  2. Essentially normal right heart pressures.  3. Normal left ventricular end diastolic pressure and ejection      fraction estimated at 55% with no regional wall motion      abnormalities.   RECOMMENDATIONS:  Continue aggressive medical therapy for high blood  pressure in this patient.  Will consider pulmonary  function testing to  further evaluate her shortness of breath.      Jettie Booze, MD  Electronically Signed     JSV/MEDQ  D:  04/10/2007  T:  04/10/2007  Job:  [id number]

## 2010-07-22 NOTE — H&P (Signed)
NAME:  Lynn, Dunn NO.:  [id number]   MEDICAL RECORD NO.:  [id number]          PATIENT TYPE:  OBV   LOCATION:  1828                         FACILITY:  [facility]   PHYSICIAN:  Broadus John, MD DATE OF BIRTH:  1930/06/12   DATE OF ADMISSION:  03/02/2004  DATE OF DISCHARGE:                                HISTORY & PHYSICAL   IDENTIFYING DATA AND CHIEF COMPLAINT:  Lynn Dunn is a 77 year old white  woman who is admitted to [facility] for chest pain and for atrial  fibrillation with a rapid ventricular rate.  The atrial fibrillation has  resolved since presentation to the emergency department.   HISTORY OF THE PRESENT ILLNESS:  The patient, who has no past history of  cardiac disease, experienced the onset of chest pain while she was watching  television.  This is described as a pressure in the center of her chest.  It  did not radiate.  It was associated with dyspnea and diaphoresis, but no  nausea.  There are no exacerbating or ameliorating factors.  It appears not  be related to position, activity, meals or respirations.  Soon thereafter  she noted a rapid and irregular heart beat.  There was no dizziness,  lightheadedness, syncope or near syncope.  He ultimately summoned EMS and  was transported to the emergency department.  EMS found her to be in atrial  fibrillation with a rapid ventricular rate.   In the emergency department she was started on diltiazem and her rhythm  ultimately converted to normal sinus rhythm.  In addition, her chest pain  had largely resolved.  The total duration of chest pain and palpitations was  approximately three hours.   PAST MEDICAL HISTORY:  As noted the patient has no past history of cardiac  disease including no history of chest pain. MI,coronary artery disease,  congestive heart failure, or arrhythmia.  She has a history of hypertension  and is on medications.  There is no history of diabetes mellitus,  smoking,  dyslipidemia or family history of early coronary artery disease.  The  patient's only other medical problem is that of arthritis.   MEDICATIONS:  Atenolol.   ALLERGIES:  None.   PAST SURGICAL HISTORY:  None.   INJURIES AND ACCIDENTS:  Significant injuries; none.   FAMILY HISTORY:  The family history is noncontributory.   Review of systems reveals no problems related to her head, eyes, ears, nose,  mouth, throat, lungs, gastrointestinal system, genitourinary system, or  extremities.  There is no history of neurologic or psychiatric disorder.  There is no history of fever, chills or weight loss.   PHYSICAL EXAMINATION:  VITAL SIGNS:  Physical examination after conversion;  blood pressure 121/53, pulse 89 and regular, and respirations 20.  GENERAL APPEARANCE:  The patient is an obese elderly white woman in no  discomfort.  She is alert, oriented, appropriate and responsive.  HEENT:  Head, eyes, nose and mouth are normal.  NECK:  The neck is without thyromegaly or adenopathy.  Carotid pulses are  palpable bilaterally and without bruits.  HEART:  Cardiac examination reveals a normal S1 and S2.  There is no S3, S4,  murmur, rub, or click.  Cardiac rhythm is regular.  CHEST:  No chest wall tenderness is noted.  LUNGS: The lungs are clear.  ABDOMEN: The abdomen is soft and nontender.  There is no mass,  hepatosplenomegaly, bruit, distention, rebound, guarding, or rigidity.  Bowel sounds are normal.  BREASTS, PELVIC AND RECTAL:  Breast, pelvic and rectal examinations are not  performed as they are not pertinent to the reason for acute care  hospitalization.  EXTREMITIES:  The extremities are without edema, deviation or deformity.  Radial and dorsalis pedis pulses are palpable bilaterally.  NEUROLOGIC EXAMINATION:  Brief screening neurologic survey is unremarkable.   LABORATORY DATA:  The presenting electrocardiogram revealed atrial  fibrillation with a ventricular rate of  156 beats/minute, evidence of a  prior anteroseptal myocardial infarction was noted (Q waves in V2 and V3),  nonspecific ST and T wave changes were noted.  Chest radiograph according to  the radiologist demonstrated evidence of chronic obstructive pulmonary  disease.  The initial set of cardiac markers revealed a myoglobin of 75.2,  CK/MB less than 1.0 and troponin less than 0.05.  The second set of cardiac  markers revealed a myoglobin of 61.5, CK/MB 1.2 and troponin less than 0.05.  White count was 12.4 with a hemoglobin of 12.7 and hematocrit of 37.8.  Potassium was 3.0, BUN 16 and creatinine 1.0.  The remaining studies are  pending at the time of this dictation.   IMPRESSION:  1.  Chest pain, rule out coronary artery disease.  2.  Atrial fibrillation with rapid ventricular rate, resolved.  3.  Hypertension.   PLAN:  1.  Telemetry.  2.  Serial cardiac enzymes.  3.  Aspirin.  4.  Intravenous heparin.  5.  Intravenous nitroglycerin.  6.  Fasting lipid profile.  7.  Thyroid function tests.  8.  Echocardiogram.  9.  Further measures per Dr. Jaci Standard.   The patient's husband was present throughout the patient encounter.      [place]   MSC/MEDQ  D:  03/03/2004  T:  03/03/2004  Job:  [id number]

## 2010-11-25 LAB — POCT I-STAT 3, ART BLOOD GAS (G3+)
Bicarbonate: 28.7 — ABNORMAL HIGH
Operator id: 221371
pH, Arterial: 7.413 — ABNORMAL HIGH
pO2, Arterial: 71 — ABNORMAL LOW

## 2010-11-25 LAB — POCT I-STAT 3, VENOUS BLOOD GAS (G3P V)
Bicarbonate: 29.7 — ABNORMAL HIGH
O2 Saturation: 67
O2 Saturation: 72
TCO2: 28
TCO2: 31
pCO2, Ven: 49.6
pCO2, Ven: 49.9
pH, Ven: 7.342 — ABNORMAL HIGH
pO2, Ven: 37

## 2010-12-01 LAB — CARDIAC PANEL(CRET KIN+CKTOT+MB+TROPI)
CK, MB: 1.2
Troponin I: 0.01

## 2010-12-01 LAB — CBC
HCT: 37.5
Hemoglobin: 12
Hemoglobin: 10 — ABNORMAL LOW
Hemoglobin: 10.5 — ABNORMAL LOW
MCHC: 34
MCHC: 34.2
MCHC: 34.2
MCHC: 35.8
MCV: 92.3
MCV: 92.9
MCV: 92.8
Platelets: 339
Platelets: 252
RBC: 3.77 — ABNORMAL LOW
RBC: 2.99 — ABNORMAL LOW
RBC: 3.12 — ABNORMAL LOW
RBC: 3.25 — ABNORMAL LOW
RDW: 13.9
RDW: 14.3
WBC: 7.7
WBC: 10.4
WBC: 9.4

## 2010-12-01 LAB — COMPREHENSIVE METABOLIC PANEL
ALT: 18
CO2: 31
Calcium: 8.8
Creatinine, Ser: 0.93
GFR calc non Af Amer: 59 — ABNORMAL LOW
Glucose, Bld: 132 — ABNORMAL HIGH
Sodium: 132 — ABNORMAL LOW
Total Protein: 6.4

## 2010-12-01 LAB — HEMOGLOBIN A1C
Hgb A1c MFr Bld: 6.2 — ABNORMAL HIGH
Hgb A1c MFr Bld: 6.2 — ABNORMAL HIGH
Mean Plasma Glucose: 143

## 2010-12-01 LAB — PROTIME-INR
INR: 0.9
INR: 1
INR: 1.3
Prothrombin Time: 12.7
Prothrombin Time: 16.1 — ABNORMAL HIGH
Prothrombin Time: 17.8 — ABNORMAL HIGH

## 2010-12-01 LAB — BLOOD GAS, ARTERIAL
Acid-Base Excess: 0.7
Acid-base deficit: 1.2
Bicarbonate: 22.7
FIO2: 0.6
MECHVT: 600
Mode: POSITIVE
O2 Saturation: 99.3
O2 Saturation: 98.6
PEEP: 5
Patient temperature: 98.6
Patient temperature: 98.6
TCO2: 25.7
TCO2: 23.5
pCO2 arterial: 37.6
pCO2 arterial: 38.5
pH, Arterial: 7.524 — ABNORMAL HIGH
pO2, Arterial: 159 — ABNORMAL HIGH

## 2010-12-01 LAB — BASIC METABOLIC PANEL
CO2: 26
Calcium: 8.1 — ABNORMAL LOW
Calcium: 8.6
Chloride: 102
Chloride: 103
Creatinine, Ser: 0.74
Creatinine, Ser: 0.76
GFR calc Af Amer: >60
GFR calc Af Amer: >60
GFR calc Af Amer: >60
GFR calc Af Amer: >60
GFR calc non Af Amer: >60
GFR calc non Af Amer: >60
Glucose, Bld: 125 — ABNORMAL HIGH
Sodium: 135
Sodium: 137
Sodium: 138

## 2010-12-01 LAB — POCT I-STAT, CHEM 8
BUN: 17
Calcium, Ion: 1.08 — ABNORMAL LOW
HCT: 40
Hemoglobin: 13.6
Sodium: 131 — ABNORMAL LOW
TCO2: 30

## 2010-12-01 LAB — LIPID PANEL
HDL: 39 — ABNORMAL LOW
Total CHOL/HDL Ratio: 6.7
VLDL: UNDETERMINED

## 2010-12-01 LAB — POCT CARDIAC MARKERS
Myoglobin, poc: 54
Operator id: 151321

## 2010-12-01 LAB — DIFFERENTIAL
Lymphocytes Relative: 19
Lymphs Abs: 1.5
Monocytes Relative: 5
Neutro Abs: 5.7
Neutrophils Relative %: 73

## 2010-12-01 LAB — MAGNESIUM
Magnesium: 2.2
Magnesium: 2.1
Magnesium: 2.2

## 2010-12-01 LAB — APTT: aPTT: 30

## 2010-12-01 LAB — TROPONIN I: Troponin I: 0.02

## 2010-12-01 LAB — PHOSPHORUS: Phosphorus: 3.1

## 2010-12-01 LAB — CK TOTAL AND CKMB (NOT AT ARMC): Relative Index: INVALID

## 2010-12-01 LAB — HOMOCYSTEINE: Homocysteine: 10.4

## 2011-03-09 DIAGNOSIS — Z1231 Encounter for screening mammogram for malignant neoplasm of breast: Secondary | ICD-10-CM | POA: Diagnosis not present

## 2011-03-10 DIAGNOSIS — Z7901 Long term (current) use of anticoagulants: Secondary | ICD-10-CM | POA: Diagnosis not present

## 2011-03-10 DIAGNOSIS — I4891 Unspecified atrial fibrillation: Secondary | ICD-10-CM | POA: Diagnosis not present

## 2011-03-13 DIAGNOSIS — I4891 Unspecified atrial fibrillation: Secondary | ICD-10-CM | POA: Diagnosis not present

## 2011-03-13 DIAGNOSIS — Z7901 Long term (current) use of anticoagulants: Secondary | ICD-10-CM | POA: Diagnosis not present

## 2011-03-20 DIAGNOSIS — Z7901 Long term (current) use of anticoagulants: Secondary | ICD-10-CM | POA: Diagnosis not present

## 2011-04-03 DIAGNOSIS — Z7901 Long term (current) use of anticoagulants: Secondary | ICD-10-CM | POA: Diagnosis not present

## 2011-04-17 DIAGNOSIS — Z7901 Long term (current) use of anticoagulants: Secondary | ICD-10-CM | POA: Diagnosis not present

## 2011-04-24 DIAGNOSIS — H2510 Age-related nuclear cataract, unspecified eye: Secondary | ICD-10-CM | POA: Diagnosis not present

## 2011-04-28 DIAGNOSIS — Z7901 Long term (current) use of anticoagulants: Secondary | ICD-10-CM | POA: Diagnosis not present

## 2011-05-22 DIAGNOSIS — R0902 Hypoxemia: Secondary | ICD-10-CM | POA: Diagnosis not present

## 2011-05-22 DIAGNOSIS — J45901 Unspecified asthma with (acute) exacerbation: Secondary | ICD-10-CM | POA: Diagnosis not present

## 2011-05-22 DIAGNOSIS — I4891 Unspecified atrial fibrillation: Secondary | ICD-10-CM | POA: Diagnosis not present

## 2011-05-22 DIAGNOSIS — J9801 Acute bronchospasm: Secondary | ICD-10-CM | POA: Diagnosis not present

## 2011-05-22 DIAGNOSIS — D649 Anemia, unspecified: Secondary | ICD-10-CM | POA: Diagnosis not present

## 2011-05-22 DIAGNOSIS — I498 Other specified cardiac arrhythmias: Secondary | ICD-10-CM | POA: Diagnosis not present

## 2011-05-22 DIAGNOSIS — R0602 Shortness of breath: Secondary | ICD-10-CM | POA: Diagnosis not present

## 2011-05-22 DIAGNOSIS — J441 Chronic obstructive pulmonary disease with (acute) exacerbation: Secondary | ICD-10-CM | POA: Diagnosis not present

## 2011-05-22 DIAGNOSIS — R791 Abnormal coagulation profile: Secondary | ICD-10-CM | POA: Diagnosis not present

## 2011-05-22 DIAGNOSIS — J40 Bronchitis, not specified as acute or chronic: Secondary | ICD-10-CM | POA: Diagnosis not present

## 2011-05-22 DIAGNOSIS — R05 Cough: Secondary | ICD-10-CM | POA: Diagnosis not present

## 2011-05-22 DIAGNOSIS — I10 Essential (primary) hypertension: Secondary | ICD-10-CM | POA: Diagnosis not present

## 2011-05-22 DIAGNOSIS — J440 Chronic obstructive pulmonary disease with acute lower respiratory infection: Secondary | ICD-10-CM | POA: Diagnosis not present

## 2011-05-23 DIAGNOSIS — R0902 Hypoxemia: Secondary | ICD-10-CM | POA: Diagnosis not present

## 2011-05-23 DIAGNOSIS — D649 Anemia, unspecified: Secondary | ICD-10-CM | POA: Diagnosis not present

## 2011-05-23 DIAGNOSIS — I4891 Unspecified atrial fibrillation: Secondary | ICD-10-CM | POA: Diagnosis not present

## 2011-05-23 DIAGNOSIS — J209 Acute bronchitis, unspecified: Secondary | ICD-10-CM | POA: Diagnosis not present

## 2011-05-25 DIAGNOSIS — J209 Acute bronchitis, unspecified: Secondary | ICD-10-CM | POA: Diagnosis not present

## 2011-05-25 DIAGNOSIS — I4891 Unspecified atrial fibrillation: Secondary | ICD-10-CM | POA: Diagnosis not present

## 2011-05-25 DIAGNOSIS — I10 Essential (primary) hypertension: Secondary | ICD-10-CM | POA: Diagnosis not present

## 2011-05-25 DIAGNOSIS — K219 Gastro-esophageal reflux disease without esophagitis: Secondary | ICD-10-CM | POA: Diagnosis not present

## 2011-05-25 DIAGNOSIS — T380X5A Adverse effect of glucocorticoids and synthetic analogues, initial encounter: Secondary | ICD-10-CM | POA: Diagnosis present

## 2011-05-25 DIAGNOSIS — I499 Cardiac arrhythmia, unspecified: Secondary | ICD-10-CM | POA: Diagnosis not present

## 2011-05-25 DIAGNOSIS — M1990 Unspecified osteoarthritis, unspecified site: Secondary | ICD-10-CM | POA: Diagnosis present

## 2011-05-25 DIAGNOSIS — J440 Chronic obstructive pulmonary disease with acute lower respiratory infection: Secondary | ICD-10-CM | POA: Diagnosis not present

## 2011-05-25 DIAGNOSIS — R072 Precordial pain: Secondary | ICD-10-CM | POA: Diagnosis not present

## 2011-05-25 DIAGNOSIS — Z7901 Long term (current) use of anticoagulants: Secondary | ICD-10-CM | POA: Diagnosis not present

## 2011-05-25 DIAGNOSIS — Z8673 Personal history of transient ischemic attack (TIA), and cerebral infarction without residual deficits: Secondary | ICD-10-CM | POA: Diagnosis not present

## 2011-05-25 DIAGNOSIS — R791 Abnormal coagulation profile: Secondary | ICD-10-CM | POA: Diagnosis not present

## 2011-05-25 DIAGNOSIS — E669 Obesity, unspecified: Secondary | ICD-10-CM | POA: Diagnosis present

## 2011-05-25 DIAGNOSIS — D72829 Elevated white blood cell count, unspecified: Secondary | ICD-10-CM | POA: Diagnosis present

## 2011-05-25 DIAGNOSIS — G4733 Obstructive sleep apnea (adult) (pediatric): Secondary | ICD-10-CM | POA: Diagnosis not present

## 2011-05-25 DIAGNOSIS — E7800 Pure hypercholesterolemia, unspecified: Secondary | ICD-10-CM | POA: Diagnosis present

## 2011-06-01 DIAGNOSIS — Z79899 Other long term (current) drug therapy: Secondary | ICD-10-CM | POA: Diagnosis not present

## 2011-06-01 DIAGNOSIS — E782 Mixed hyperlipidemia: Secondary | ICD-10-CM | POA: Diagnosis not present

## 2011-06-01 DIAGNOSIS — Z7901 Long term (current) use of anticoagulants: Secondary | ICD-10-CM | POA: Diagnosis not present

## 2011-06-06 DIAGNOSIS — M5030 Other cervical disc degeneration, unspecified cervical region: Secondary | ICD-10-CM | POA: Diagnosis not present

## 2011-06-06 DIAGNOSIS — R51 Headache: Secondary | ICD-10-CM | POA: Diagnosis not present

## 2011-06-06 DIAGNOSIS — M9981 Other biomechanical lesions of cervical region: Secondary | ICD-10-CM | POA: Diagnosis not present

## 2011-06-06 DIAGNOSIS — M542 Cervicalgia: Secondary | ICD-10-CM | POA: Diagnosis not present

## 2011-06-07 DIAGNOSIS — H2510 Age-related nuclear cataract, unspecified eye: Secondary | ICD-10-CM | POA: Diagnosis not present

## 2011-06-08 DIAGNOSIS — Z7901 Long term (current) use of anticoagulants: Secondary | ICD-10-CM | POA: Diagnosis not present

## 2011-06-09 DIAGNOSIS — J209 Acute bronchitis, unspecified: Secondary | ICD-10-CM | POA: Diagnosis not present

## 2011-06-12 DIAGNOSIS — IMO0002 Reserved for concepts with insufficient information to code with codable children: Secondary | ICD-10-CM | POA: Diagnosis not present

## 2011-06-12 DIAGNOSIS — H269 Unspecified cataract: Secondary | ICD-10-CM | POA: Diagnosis not present

## 2011-06-12 DIAGNOSIS — H2510 Age-related nuclear cataract, unspecified eye: Secondary | ICD-10-CM | POA: Diagnosis not present

## 2011-06-15 DIAGNOSIS — Z7901 Long term (current) use of anticoagulants: Secondary | ICD-10-CM | POA: Diagnosis not present

## 2011-06-16 DIAGNOSIS — M9981 Other biomechanical lesions of cervical region: Secondary | ICD-10-CM | POA: Diagnosis not present

## 2011-06-16 DIAGNOSIS — R51 Headache: Secondary | ICD-10-CM | POA: Diagnosis not present

## 2011-06-16 DIAGNOSIS — M5030 Other cervical disc degeneration, unspecified cervical region: Secondary | ICD-10-CM | POA: Diagnosis not present

## 2011-06-16 DIAGNOSIS — M542 Cervicalgia: Secondary | ICD-10-CM | POA: Diagnosis not present

## 2011-06-19 DIAGNOSIS — D0470 Carcinoma in situ of skin of unspecified lower limb, including hip: Secondary | ICD-10-CM | POA: Diagnosis not present

## 2011-06-19 DIAGNOSIS — D485 Neoplasm of uncertain behavior of skin: Secondary | ICD-10-CM | POA: Diagnosis not present

## 2011-06-19 DIAGNOSIS — H2510 Age-related nuclear cataract, unspecified eye: Secondary | ICD-10-CM | POA: Diagnosis not present

## 2011-06-23 DIAGNOSIS — M9981 Other biomechanical lesions of cervical region: Secondary | ICD-10-CM | POA: Diagnosis not present

## 2011-06-23 DIAGNOSIS — R51 Headache: Secondary | ICD-10-CM | POA: Diagnosis not present

## 2011-06-23 DIAGNOSIS — M542 Cervicalgia: Secondary | ICD-10-CM | POA: Diagnosis not present

## 2011-06-23 DIAGNOSIS — M5030 Other cervical disc degeneration, unspecified cervical region: Secondary | ICD-10-CM | POA: Diagnosis not present

## 2011-06-26 DIAGNOSIS — H269 Unspecified cataract: Secondary | ICD-10-CM | POA: Diagnosis not present

## 2011-06-26 DIAGNOSIS — IMO0002 Reserved for concepts with insufficient information to code with codable children: Secondary | ICD-10-CM | POA: Diagnosis not present

## 2011-06-26 DIAGNOSIS — H2510 Age-related nuclear cataract, unspecified eye: Secondary | ICD-10-CM | POA: Diagnosis not present

## 2011-06-29 DIAGNOSIS — Z7901 Long term (current) use of anticoagulants: Secondary | ICD-10-CM | POA: Diagnosis not present

## 2011-06-30 DIAGNOSIS — R51 Headache: Secondary | ICD-10-CM | POA: Diagnosis not present

## 2011-06-30 DIAGNOSIS — M9981 Other biomechanical lesions of cervical region: Secondary | ICD-10-CM | POA: Diagnosis not present

## 2011-06-30 DIAGNOSIS — M5030 Other cervical disc degeneration, unspecified cervical region: Secondary | ICD-10-CM | POA: Diagnosis not present

## 2011-06-30 DIAGNOSIS — M542 Cervicalgia: Secondary | ICD-10-CM | POA: Diagnosis not present

## 2011-07-07 DIAGNOSIS — M542 Cervicalgia: Secondary | ICD-10-CM | POA: Diagnosis not present

## 2011-07-07 DIAGNOSIS — M5030 Other cervical disc degeneration, unspecified cervical region: Secondary | ICD-10-CM | POA: Diagnosis not present

## 2011-07-07 DIAGNOSIS — R51 Headache: Secondary | ICD-10-CM | POA: Diagnosis not present

## 2011-07-07 DIAGNOSIS — M9981 Other biomechanical lesions of cervical region: Secondary | ICD-10-CM | POA: Diagnosis not present

## 2011-07-13 DIAGNOSIS — R51 Headache: Secondary | ICD-10-CM | POA: Diagnosis not present

## 2011-07-13 DIAGNOSIS — M9981 Other biomechanical lesions of cervical region: Secondary | ICD-10-CM | POA: Diagnosis not present

## 2011-07-13 DIAGNOSIS — M542 Cervicalgia: Secondary | ICD-10-CM | POA: Diagnosis not present

## 2011-07-13 DIAGNOSIS — M5030 Other cervical disc degeneration, unspecified cervical region: Secondary | ICD-10-CM | POA: Diagnosis not present

## 2011-07-18 DIAGNOSIS — M542 Cervicalgia: Secondary | ICD-10-CM | POA: Diagnosis not present

## 2011-07-18 DIAGNOSIS — R51 Headache: Secondary | ICD-10-CM | POA: Diagnosis not present

## 2011-07-18 DIAGNOSIS — M5030 Other cervical disc degeneration, unspecified cervical region: Secondary | ICD-10-CM | POA: Diagnosis not present

## 2011-07-18 DIAGNOSIS — M9981 Other biomechanical lesions of cervical region: Secondary | ICD-10-CM | POA: Diagnosis not present

## 2011-07-24 ENCOUNTER — Encounter: Payer: Self-pay | Admitting: Internal Medicine

## 2011-07-24 ENCOUNTER — Ambulatory Visit (INDEPENDENT_AMBULATORY_CARE_PROVIDER_SITE_OTHER): Payer: Medicare Other | Admitting: Internal Medicine

## 2011-07-24 VITALS — BP 120/62 | HR 73 | Ht 62.5 in | Wt 184.8 lb

## 2011-07-24 DIAGNOSIS — I4891 Unspecified atrial fibrillation: Secondary | ICD-10-CM

## 2011-07-24 NOTE — Patient Instructions (Signed)
Your physician wants you to follow-up in: 12 months with Dr Allred You will receive a reminder letter in the mail two months in advance. If you don't receive a letter, please call our office to schedule the follow-up appointment.  

## 2011-07-25 ENCOUNTER — Encounter: Payer: Self-pay | Admitting: Internal Medicine

## 2011-07-25 DIAGNOSIS — M542 Cervicalgia: Secondary | ICD-10-CM | POA: Diagnosis not present

## 2011-07-25 DIAGNOSIS — M9981 Other biomechanical lesions of cervical region: Secondary | ICD-10-CM | POA: Diagnosis not present

## 2011-07-25 DIAGNOSIS — R51 Headache: Secondary | ICD-10-CM | POA: Diagnosis not present

## 2011-07-25 DIAGNOSIS — M5030 Other cervical disc degeneration, unspecified cervical region: Secondary | ICD-10-CM | POA: Diagnosis not present

## 2011-07-25 NOTE — Assessment & Plan Note (Signed)
She has done very well s/p ablation. Over the past 2 years, she has had only 1 episode of afib and this was precipitated by albuterol. Given return of dyspnea with diltiazem, I will again stop this medicine today.  I have instructed her to restart diltiazem should afib return in the future. She will stop ASA and continue coumadin.  I will see her in a year.

## 2011-07-25 NOTE — Progress Notes (Signed)
PCP: Abigail Miyamoto, MD, MD Primary Cardiologist:  Dr Irish Lack  The patient presents today for routine electrophysiology followup.  Since last being seen in our clinic, the patient reports doing very well.  She has been maintaining sinus rhythm for 2 years post ablation.  She reports that several months ago, she did have an episode of afib while vacationing in [place].  She was evaluated for URI and placed on an antibiotic and albuterol.  She states that immediately after using her albuterol inhaler she went into afib.  She required hospitalization but quickly converted back to sinus rhythm with diltiazem.  She was placed on oral diltiazem and has had no further afib.  Interestingly, when she saw me last we stopped her diltiazem.  She felt that he chronic SOB improved significantly off of diltiazem.  Though she did not make the connection, she had found that her SOB has returned since being restarted on diltiazem in [place] months ago.  She is otherwise doing well and is without complaint today.    Past Medical History  Diagnosis Date  . HTN (hypertension)   . PAF (paroxysmal atrial fibrillation)     s/p ablation 07/29/09  . COPD (chronic obstructive pulmonary disease)   . GERD (gastroesophageal reflux disease)   . Obesity   . Stroke 6/09  . OSA (obstructive sleep apnea)   . H/O: hysterectomy    Past Surgical History  Procedure Date  . Atrial ablation surgery     s/p ablation for afib 07/29/09    Current Outpatient Prescriptions  Medication Sig Dispense Refill  . aspirin 81 MG tablet Take 81 mg by mouth daily.        Marland Kitchen BROMDAY 0.09 % SOLN Place 1 drop into both eyes daily.       . Coenzyme Q10 (COQ10) 200 MG CAPS 1 tab po qd       . CRESTOR 10 MG tablet Take 10 mg by mouth daily.       Marland Kitchen diltiazem (CARDIZEM SR) 120 MG 12 hr capsule Take 1 capsule (120 mg total) by mouth daily.  60 capsule  11  . furosemide (LASIX) 40 MG tablet Take 40 mg by mouth daily.        . metoprolol  (LOPRESSOR) 50 MG tablet Take 50 mg by mouth 2 (two) times daily.       . potassium chloride (K-DUR) 10 MEQ tablet Take 10 mEq by mouth 2 (two) times daily.       . prednisoLONE acetate (PRED FORTE) 1 % ophthalmic suspension Place 1 drop into both eyes daily.       . vitamin B-12 (CYANOCOBALAMIN) 1000 MCG tablet Take 1,000 mcg by mouth daily.        Marland Kitchen warfarin (COUMADIN) 4 MG tablet As directed       . ZETIA 10 MG tablet Take 10 mg by mouth daily.         Allergies  Allergen Reactions  . Codeine     History   Social History  . Marital Status: Married    Spouse Name: N/A    Number of Children: N/A  . Years of Education: N/A   Occupational History  . Not on file.   Social History Main Topics  . Smoking status: Never Smoker   . Smokeless tobacco: Not on file  . Alcohol Use: No  . Drug Use: No  . Sexually Active: Not on file   Other Topics Concern  . Not on  file   Social History Narrative  . No narrative on file    Family History  Problem Relation Age of Onset  . Breast cancer    . Brain cancer     Physical Exam: Filed Vitals:   07/24/11 1509  BP: 120/62  Pulse: 73  Height: 5' 2.5" (1.588 m)  Weight: 184 lb 12.8 oz (83.825 kg)    GEN- The patient is well appearing, alert and oriented x 3 today.   Head- normocephalic, atraumatic Eyes-  Sclera clear, conjunctiva pink Ears- hearing intact Oropharynx- clear Neck- supple, no JVP Lymph- no cervical lymphadenopathy Lungs- Clear to ausculation bilaterally, normal work of breathing Heart- Regular rate and rhythm, no murmurs, rubs or gallops, PMI not laterally displaced GI- soft, NT, ND, + BS Extremities- no clubbing, cyanosis, or edema  EKG- sinus rhythm 73 bpm, PR 198, anteroseptal scar  Assessment and Plan:

## 2011-07-27 DIAGNOSIS — Z7901 Long term (current) use of anticoagulants: Secondary | ICD-10-CM | POA: Diagnosis not present

## 2011-08-03 DIAGNOSIS — M5030 Other cervical disc degeneration, unspecified cervical region: Secondary | ICD-10-CM | POA: Diagnosis not present

## 2011-08-03 DIAGNOSIS — R51 Headache: Secondary | ICD-10-CM | POA: Diagnosis not present

## 2011-08-03 DIAGNOSIS — M542 Cervicalgia: Secondary | ICD-10-CM | POA: Diagnosis not present

## 2011-08-03 DIAGNOSIS — M9981 Other biomechanical lesions of cervical region: Secondary | ICD-10-CM | POA: Diagnosis not present

## 2011-08-09 DIAGNOSIS — Z7901 Long term (current) use of anticoagulants: Secondary | ICD-10-CM | POA: Diagnosis not present

## 2011-08-10 DIAGNOSIS — M9981 Other biomechanical lesions of cervical region: Secondary | ICD-10-CM | POA: Diagnosis not present

## 2011-08-10 DIAGNOSIS — M5030 Other cervical disc degeneration, unspecified cervical region: Secondary | ICD-10-CM | POA: Diagnosis not present

## 2011-08-10 DIAGNOSIS — R51 Headache: Secondary | ICD-10-CM | POA: Diagnosis not present

## 2011-08-10 DIAGNOSIS — M542 Cervicalgia: Secondary | ICD-10-CM | POA: Diagnosis not present

## 2011-08-15 ENCOUNTER — Telehealth: Payer: Self-pay | Admitting: Internal Medicine

## 2011-08-15 DIAGNOSIS — M9981 Other biomechanical lesions of cervical region: Secondary | ICD-10-CM | POA: Diagnosis not present

## 2011-08-15 DIAGNOSIS — M5030 Other cervical disc degeneration, unspecified cervical region: Secondary | ICD-10-CM | POA: Diagnosis not present

## 2011-08-15 DIAGNOSIS — M542 Cervicalgia: Secondary | ICD-10-CM | POA: Diagnosis not present

## 2011-08-15 DIAGNOSIS — R51 Headache: Secondary | ICD-10-CM | POA: Diagnosis not present

## 2011-08-15 NOTE — Telephone Encounter (Signed)
Pt calling because she was to have an EKG done at Dr. Hassell Done office and we where to set it up and she has not heard she wants to know dose she still need it

## 2011-08-15 NOTE — Telephone Encounter (Signed)
Dr. Jackalyn Lombard patient. Will forward to [place].

## 2011-08-16 NOTE — Telephone Encounter (Signed)
lmom for patient to return my call.  I do not see the need for an EKG from the office note.  I will forward to Dr Rayann Heman for his recommendation

## 2011-08-17 DIAGNOSIS — M1710 Unilateral primary osteoarthritis, unspecified knee: Secondary | ICD-10-CM | POA: Diagnosis not present

## 2011-08-24 DIAGNOSIS — M542 Cervicalgia: Secondary | ICD-10-CM | POA: Diagnosis not present

## 2011-08-24 DIAGNOSIS — M5030 Other cervical disc degeneration, unspecified cervical region: Secondary | ICD-10-CM | POA: Diagnosis not present

## 2011-08-24 DIAGNOSIS — R51 Headache: Secondary | ICD-10-CM | POA: Diagnosis not present

## 2011-08-24 DIAGNOSIS — M9981 Other biomechanical lesions of cervical region: Secondary | ICD-10-CM | POA: Diagnosis not present

## 2011-08-27 NOTE — Telephone Encounter (Signed)
I think that our discussion was that she should have an echo by Dr Irish Lack.  If she has not had an echo within the past year, she should have one obtained.

## 2011-08-29 NOTE — Telephone Encounter (Signed)
lmom for pt that it is an ultrasound of her heart she needs to have done per Dr Rayann Heman and I will be glad to forward a not to Dr Irish Lack

## 2011-08-30 DIAGNOSIS — Z7901 Long term (current) use of anticoagulants: Secondary | ICD-10-CM | POA: Diagnosis not present

## 2011-08-30 DIAGNOSIS — I4891 Unspecified atrial fibrillation: Secondary | ICD-10-CM | POA: Diagnosis not present

## 2011-08-31 DIAGNOSIS — M1710 Unilateral primary osteoarthritis, unspecified knee: Secondary | ICD-10-CM | POA: Diagnosis not present

## 2011-09-01 DIAGNOSIS — M542 Cervicalgia: Secondary | ICD-10-CM | POA: Diagnosis not present

## 2011-09-01 DIAGNOSIS — M9981 Other biomechanical lesions of cervical region: Secondary | ICD-10-CM | POA: Diagnosis not present

## 2011-09-01 DIAGNOSIS — R51 Headache: Secondary | ICD-10-CM | POA: Diagnosis not present

## 2011-09-01 DIAGNOSIS — M5030 Other cervical disc degeneration, unspecified cervical region: Secondary | ICD-10-CM | POA: Diagnosis not present

## 2011-09-04 ENCOUNTER — Telehealth: Payer: Self-pay | Admitting: Internal Medicine

## 2011-09-04 NOTE — Telephone Encounter (Signed)
lmtcb

## 2011-09-04 NOTE — Telephone Encounter (Signed)
New msg Pt wants you to call [place] about echo. Please call

## 2011-09-05 NOTE — Telephone Encounter (Signed)
Patient called, she stated she saw Dr.Allred 07/24/11 and he wanted to schedule a echo at Dr.Varansi's office.Patient was told  Dr.Allred's 07/24/11 note  did not mentioned scheduling a echo.Dr.Allred and his nurse out of office this week.Will forward message to them and you will get a call next week.

## 2011-09-05 NOTE — Telephone Encounter (Signed)
I sent a staff message about this last week.  Yes, please have her obtain an echo from Dr Hassell Done office since she has not had one is several years.

## 2011-09-06 NOTE — Telephone Encounter (Signed)
Dr.Varanasi's office called echo scheduled 09/11/11 at 9:45 am arrive at 9:30 am.Patient  called and told echo schedule at Dr.Varanasi's office.

## 2011-09-11 DIAGNOSIS — I4891 Unspecified atrial fibrillation: Secondary | ICD-10-CM | POA: Diagnosis not present

## 2011-09-12 DIAGNOSIS — M1710 Unilateral primary osteoarthritis, unspecified knee: Secondary | ICD-10-CM | POA: Diagnosis not present

## 2011-09-13 DIAGNOSIS — Z7901 Long term (current) use of anticoagulants: Secondary | ICD-10-CM | POA: Diagnosis not present

## 2011-09-15 DIAGNOSIS — M542 Cervicalgia: Secondary | ICD-10-CM | POA: Diagnosis not present

## 2011-09-15 DIAGNOSIS — R51 Headache: Secondary | ICD-10-CM | POA: Diagnosis not present

## 2011-09-15 DIAGNOSIS — M5030 Other cervical disc degeneration, unspecified cervical region: Secondary | ICD-10-CM | POA: Diagnosis not present

## 2011-09-15 DIAGNOSIS — M9981 Other biomechanical lesions of cervical region: Secondary | ICD-10-CM | POA: Diagnosis not present

## 2011-09-19 DIAGNOSIS — M1710 Unilateral primary osteoarthritis, unspecified knee: Secondary | ICD-10-CM | POA: Diagnosis not present

## 2011-09-25 DIAGNOSIS — M1710 Unilateral primary osteoarthritis, unspecified knee: Secondary | ICD-10-CM | POA: Diagnosis not present

## 2011-09-25 DIAGNOSIS — M542 Cervicalgia: Secondary | ICD-10-CM | POA: Diagnosis not present

## 2011-09-25 DIAGNOSIS — Z7901 Long term (current) use of anticoagulants: Secondary | ICD-10-CM | POA: Diagnosis not present

## 2011-09-25 DIAGNOSIS — M5030 Other cervical disc degeneration, unspecified cervical region: Secondary | ICD-10-CM | POA: Diagnosis not present

## 2011-09-25 DIAGNOSIS — M9981 Other biomechanical lesions of cervical region: Secondary | ICD-10-CM | POA: Diagnosis not present

## 2011-09-25 DIAGNOSIS — R51 Headache: Secondary | ICD-10-CM | POA: Diagnosis not present

## 2011-10-03 DIAGNOSIS — M542 Cervicalgia: Secondary | ICD-10-CM | POA: Diagnosis not present

## 2011-10-03 DIAGNOSIS — M5030 Other cervical disc degeneration, unspecified cervical region: Secondary | ICD-10-CM | POA: Diagnosis not present

## 2011-10-03 DIAGNOSIS — R51 Headache: Secondary | ICD-10-CM | POA: Diagnosis not present

## 2011-10-03 DIAGNOSIS — M9981 Other biomechanical lesions of cervical region: Secondary | ICD-10-CM | POA: Diagnosis not present

## 2011-10-13 DIAGNOSIS — M9981 Other biomechanical lesions of cervical region: Secondary | ICD-10-CM | POA: Diagnosis not present

## 2011-10-13 DIAGNOSIS — R51 Headache: Secondary | ICD-10-CM | POA: Diagnosis not present

## 2011-10-13 DIAGNOSIS — M5030 Other cervical disc degeneration, unspecified cervical region: Secondary | ICD-10-CM | POA: Diagnosis not present

## 2011-10-13 DIAGNOSIS — M542 Cervicalgia: Secondary | ICD-10-CM | POA: Diagnosis not present

## 2011-10-16 DIAGNOSIS — M5030 Other cervical disc degeneration, unspecified cervical region: Secondary | ICD-10-CM | POA: Diagnosis not present

## 2011-10-16 DIAGNOSIS — M9981 Other biomechanical lesions of cervical region: Secondary | ICD-10-CM | POA: Diagnosis not present

## 2011-10-16 DIAGNOSIS — R51 Headache: Secondary | ICD-10-CM | POA: Diagnosis not present

## 2011-10-16 DIAGNOSIS — M542 Cervicalgia: Secondary | ICD-10-CM | POA: Diagnosis not present

## 2011-10-23 DIAGNOSIS — Z7901 Long term (current) use of anticoagulants: Secondary | ICD-10-CM | POA: Diagnosis not present

## 2011-10-27 DIAGNOSIS — M9981 Other biomechanical lesions of cervical region: Secondary | ICD-10-CM | POA: Diagnosis not present

## 2011-10-27 DIAGNOSIS — M542 Cervicalgia: Secondary | ICD-10-CM | POA: Diagnosis not present

## 2011-10-27 DIAGNOSIS — M5030 Other cervical disc degeneration, unspecified cervical region: Secondary | ICD-10-CM | POA: Diagnosis not present

## 2011-10-27 DIAGNOSIS — R51 Headache: Secondary | ICD-10-CM | POA: Diagnosis not present

## 2011-11-07 DIAGNOSIS — M5030 Other cervical disc degeneration, unspecified cervical region: Secondary | ICD-10-CM | POA: Diagnosis not present

## 2011-11-07 DIAGNOSIS — R51 Headache: Secondary | ICD-10-CM | POA: Diagnosis not present

## 2011-11-07 DIAGNOSIS — M542 Cervicalgia: Secondary | ICD-10-CM | POA: Diagnosis not present

## 2011-11-07 DIAGNOSIS — M9981 Other biomechanical lesions of cervical region: Secondary | ICD-10-CM | POA: Diagnosis not present

## 2011-11-20 DIAGNOSIS — Z7901 Long term (current) use of anticoagulants: Secondary | ICD-10-CM | POA: Diagnosis not present

## 2011-11-20 DIAGNOSIS — Z23 Encounter for immunization: Secondary | ICD-10-CM | POA: Diagnosis not present

## 2011-11-21 DIAGNOSIS — M542 Cervicalgia: Secondary | ICD-10-CM | POA: Diagnosis not present

## 2011-11-21 DIAGNOSIS — R51 Headache: Secondary | ICD-10-CM | POA: Diagnosis not present

## 2011-11-21 DIAGNOSIS — M5030 Other cervical disc degeneration, unspecified cervical region: Secondary | ICD-10-CM | POA: Diagnosis not present

## 2011-11-21 DIAGNOSIS — M9981 Other biomechanical lesions of cervical region: Secondary | ICD-10-CM | POA: Diagnosis not present

## 2011-12-04 DIAGNOSIS — I4891 Unspecified atrial fibrillation: Secondary | ICD-10-CM | POA: Diagnosis not present

## 2011-12-04 DIAGNOSIS — E782 Mixed hyperlipidemia: Secondary | ICD-10-CM | POA: Diagnosis not present

## 2011-12-04 DIAGNOSIS — Z79899 Other long term (current) drug therapy: Secondary | ICD-10-CM | POA: Diagnosis not present

## 2011-12-05 DIAGNOSIS — R51 Headache: Secondary | ICD-10-CM | POA: Diagnosis not present

## 2011-12-05 DIAGNOSIS — M542 Cervicalgia: Secondary | ICD-10-CM | POA: Diagnosis not present

## 2011-12-05 DIAGNOSIS — M9981 Other biomechanical lesions of cervical region: Secondary | ICD-10-CM | POA: Diagnosis not present

## 2011-12-05 DIAGNOSIS — M5030 Other cervical disc degeneration, unspecified cervical region: Secondary | ICD-10-CM | POA: Diagnosis not present

## 2011-12-18 DIAGNOSIS — L570 Actinic keratosis: Secondary | ICD-10-CM | POA: Diagnosis not present

## 2011-12-18 DIAGNOSIS — Z85828 Personal history of other malignant neoplasm of skin: Secondary | ICD-10-CM | POA: Diagnosis not present

## 2012-01-02 DIAGNOSIS — R51 Headache: Secondary | ICD-10-CM | POA: Diagnosis not present

## 2012-01-02 DIAGNOSIS — M5030 Other cervical disc degeneration, unspecified cervical region: Secondary | ICD-10-CM | POA: Diagnosis not present

## 2012-01-02 DIAGNOSIS — M9981 Other biomechanical lesions of cervical region: Secondary | ICD-10-CM | POA: Diagnosis not present

## 2012-01-02 DIAGNOSIS — M542 Cervicalgia: Secondary | ICD-10-CM | POA: Diagnosis not present

## 2012-01-03 DIAGNOSIS — Z7901 Long term (current) use of anticoagulants: Secondary | ICD-10-CM | POA: Diagnosis not present

## 2012-01-21 ENCOUNTER — Encounter (HOSPITAL_BASED_OUTPATIENT_CLINIC_OR_DEPARTMENT_OTHER): Payer: Self-pay | Admitting: *Deleted

## 2012-01-21 ENCOUNTER — Emergency Department (HOSPITAL_BASED_OUTPATIENT_CLINIC_OR_DEPARTMENT_OTHER): Payer: Medicare Other

## 2012-01-21 ENCOUNTER — Emergency Department (HOSPITAL_BASED_OUTPATIENT_CLINIC_OR_DEPARTMENT_OTHER)
Admission: EM | Admit: 2012-01-21 | Discharge: 2012-01-22 | Disposition: A | Discharge status: Home / Self Care | Payer: Medicare Other | Source: Home / Self Care | Attending: Emergency Medicine | Admitting: Emergency Medicine

## 2012-01-21 DIAGNOSIS — E669 Obesity, unspecified: Secondary | ICD-10-CM | POA: Insufficient documentation

## 2012-01-21 DIAGNOSIS — J4489 Other specified chronic obstructive pulmonary disease: Secondary | ICD-10-CM | POA: Insufficient documentation

## 2012-01-21 DIAGNOSIS — I4891 Unspecified atrial fibrillation: Secondary | ICD-10-CM | POA: Insufficient documentation

## 2012-01-21 DIAGNOSIS — Z8673 Personal history of transient ischemic attack (TIA), and cerebral infarction without residual deficits: Secondary | ICD-10-CM | POA: Insufficient documentation

## 2012-01-21 DIAGNOSIS — J449 Chronic obstructive pulmonary disease, unspecified: Secondary | ICD-10-CM | POA: Insufficient documentation

## 2012-01-21 DIAGNOSIS — G4733 Obstructive sleep apnea (adult) (pediatric): Secondary | ICD-10-CM | POA: Insufficient documentation

## 2012-01-21 DIAGNOSIS — Z79899 Other long term (current) drug therapy: Secondary | ICD-10-CM | POA: Insufficient documentation

## 2012-01-21 DIAGNOSIS — Z7901 Long term (current) use of anticoagulants: Secondary | ICD-10-CM | POA: Insufficient documentation

## 2012-01-21 DIAGNOSIS — J9819 Other pulmonary collapse: Secondary | ICD-10-CM | POA: Diagnosis not present

## 2012-01-21 DIAGNOSIS — Z8719 Personal history of other diseases of the digestive system: Secondary | ICD-10-CM | POA: Insufficient documentation

## 2012-01-21 DIAGNOSIS — J9801 Acute bronchospasm: Secondary | ICD-10-CM

## 2012-01-21 DIAGNOSIS — I10 Essential (primary) hypertension: Secondary | ICD-10-CM | POA: Insufficient documentation

## 2012-01-21 DIAGNOSIS — IMO0002 Reserved for concepts with insufficient information to code with codable children: Secondary | ICD-10-CM | POA: Insufficient documentation

## 2012-01-21 MED ORDER — LEVALBUTEROL HCL 0.63 MG/3ML IN NEBU
0.6300 mg | INHALATION_SOLUTION | Freq: Once | RESPIRATORY_TRACT | Status: AC
  Administered 2012-01-21: 0.63 mg via RESPIRATORY_TRACT
  Filled 2012-01-21: qty 3

## 2012-01-21 MED ORDER — IPRATROPIUM BROMIDE 0.02 % IN SOLN
0.5000 mg | Freq: Once | RESPIRATORY_TRACT | Status: AC
  Administered 2012-01-21: 0.5 mg via RESPIRATORY_TRACT
  Filled 2012-01-21: qty 2.5

## 2012-01-21 MED ORDER — ALBUTEROL SULFATE (5 MG/ML) 0.5% IN NEBU
5.0000 mg | INHALATION_SOLUTION | Freq: Once | RESPIRATORY_TRACT | Status: DC
  Filled 2012-01-21: qty 1

## 2012-01-21 NOTE — ED Notes (Signed)
Pt states she was "fine" until she "inhaled" some yogurt and nuts she was eating. Immediately began coughing. Also c/o CP radiating up into neck. Taken to ED9. EKG being done.

## 2012-01-21 NOTE — ED Notes (Signed)
Physician at bedside.

## 2012-01-21 NOTE — ED Notes (Signed)
Pt return from 2 view CXR

## 2012-01-21 NOTE — ED Provider Notes (Signed)
History   This chart was scribed for Wynetta Fines, MD by Josefa Half, ED Scribe. This patient was seen in room MH09/MH09 and the patient's care was started at 11:53 PM. CSN: [id number]  Arrival date & time 01/21/12  2136     Chief Complaint  Patient presents with  . Shortness of Breath   The history is provided by the patient and the spouse. No language interpreter was used.    Lynn Dunn is a 78 y.o. female who presents to the Emergency Department complaining of Having choked on something while eating yogurt several hours ago. Since then she has had non-productive, constant  coughing. The symptoms are moderate to severe. There is associated nausea, SOB and chest pain on deep inspirations. There are no alleviating factors. She denies vomiting, fever, chills, abdominal pain, back pain.     Past Medical History  Diagnosis Date  . HTN (hypertension)   . PAF (paroxysmal atrial fibrillation)     s/p ablation 07/29/09  . COPD (chronic obstructive pulmonary disease)   . GERD (gastroesophageal reflux disease)   . Obesity   . Stroke 6/09  . OSA (obstructive sleep apnea)   . H/O: hysterectomy     Past Surgical History  Procedure Date  . Atrial ablation surgery     s/p ablation for afib 07/29/09    Family History  Problem Relation Age of Onset  . Breast cancer    . Brain cancer      History  Substance Use Topics  . Smoking status: Never Smoker   . Smokeless tobacco: Not on file  . Alcohol Use: No    OB History    Grav Para Term Preterm Abortions TAB SAB Ect Mult Living                  Review of Systems  Cardiovascular: Positive for chest pain.  Gastrointestinal: Positive for nausea. Negative for vomiting and abdominal pain.  All other systems reviewed and are negative.    Allergies  Review of patient's allergies indicates no active allergies.  Home Medications   Current Outpatient Rx  Name  Route  Sig  Dispense  Refill  . VITAMIN D PO   Oral  Take by mouth.         . ASPIRIN 81 MG PO TABS   Oral   Take 81 mg by mouth daily.           Marland Kitchen BROMDAY 0.09 % OP SOLN   Both Eyes   Place 1 drop into both eyes daily.          . COQ10 200 MG PO CAPS      1 tab po qd          . CRESTOR 10 MG PO TABS   Oral   Take 10 mg by mouth daily.          Marland Kitchen DILTIAZEM HCL ER 120 MG PO CP12   Oral   Take 1 capsule (120 mg total) by mouth daily.   60 capsule   11   . FUROSEMIDE 40 MG PO TABS   Oral   Take 40 mg by mouth daily.           Marland Kitchen METOPROLOL TARTRATE 50 MG PO TABS   Oral   Take 50 mg by mouth 2 (two) times daily.          Marland Kitchen POTASSIUM CHLORIDE ER 10 MEQ PO TBCR   Oral  Take 10 mEq by mouth 2 (two) times daily.          Marland Kitchen PREDNISOLONE ACETATE 1 % OP SUSP   Both Eyes   Place 1 drop into both eyes daily.          Marland Kitchen VITAMIN B-12 1000 MCG PO TABS   Oral   Take 1,000 mcg by mouth daily.           . WARFARIN SODIUM 4 MG PO TABS      As directed          . ZETIA 10 MG PO TABS   Oral   Take 10 mg by mouth daily.            BP 184/86  Pulse 80  Temp 98.1 F (36.7 C) (Oral)  Resp 28  Ht 5' 2.5" (1.588 m)  Wt 194 lb (87.998 kg)  BMI 34.92 kg/m2  SpO2 99%  Physical Exam  Constitutional: She is oriented to person, place, and time. She appears well-developed.  HENT:  Head: Normocephalic and atraumatic.  Right Ear: External ear normal.  Left Ear: External ear normal.  Eyes: Pupils are equal, round, and reactive to light.       Left subconjunctival hemorrhage   Neck: Normal range of motion.  Pulmonary/Chest:       Faint inspiratory wheezes.   Abdominal: Soft. She exhibits no distension. There is no tenderness.  Musculoskeletal: Normal range of motion. She exhibits edema. She exhibits no tenderness.       Chronic appearing edema of lower extremities.  Impulses are normal.   Neurological: She is alert and oriented to person, place, and time.    ED Course  Procedures (including critical  care time)  DIAGNOSTIC STUDIES: Oxygen Saturation is 99% on room air, normal by my interpretation.    COORDINATION OF CARE: 10:54 PM Discussed treatment plan with pt at bedside and pt agreed to plan.     MDM  Dg Chest 2 View  01/21/2012  *RADIOLOGY REPORT*  Clinical Data:  Shortness of breath, cough, patient states she aspirated yogurt  CHEST - 2 VIEW  Comparison: 12/24/2009  Findings: Enlargement of cardiac silhouette. Slight, right paratracheal soft tissues unchanged since 2010. Tortuous aorta with atherosclerotic calcification at arch. Pulmonary vascularity normal. Minimal right basilar atelectasis, chronic. No definite infiltrate, pleural effusion or pneumothorax. Bones appear diffusely demineralized.  IMPRESSION: Enlargement of cardiac silhouette. Minimal chronic right basilar atelectasis.   Original Report Authenticated By: Lavonia Dana, M.D.    12:26 AM Patient symptoms are consistent with a COPD exacerbation triggered by a choking episode. She is not currently on any bronchodilator.  Air movement improved after Xopenex neb treatment. Patient continues to cough. Will provide patient a prescription for Xopenex inhaler with the caveat that her insurance may not pay for it. She was advised that no indication for antibiotics at this time but should she develop fever or cough productive of green-yellow sputum she should have herself reevaluated.   I personally performed the services described in this documentation, which was scribed in my presence.  The recorded information has been reviewed and accurate.     Wynetta Fines, MD 01/22/12 [phone]

## 2012-01-22 DIAGNOSIS — J690 Pneumonitis due to inhalation of food and vomit: Secondary | ICD-10-CM | POA: Diagnosis not present

## 2012-01-22 MED ORDER — LEVALBUTEROL TARTRATE 45 MCG/ACT IN AERO: 1.0000 | INHALATION_SPRAY | RESPIRATORY_TRACT | Status: DC | PRN

## 2012-01-22 NOTE — ED Notes (Signed)
Pt states she still doesn't feel well but is refusing to stay for further treatment.

## 2012-01-23 ENCOUNTER — Encounter (HOSPITAL_BASED_OUTPATIENT_CLINIC_OR_DEPARTMENT_OTHER): Payer: Self-pay

## 2012-01-23 ENCOUNTER — Inpatient Hospital Stay (HOSPITAL_BASED_OUTPATIENT_CLINIC_OR_DEPARTMENT_OTHER)
Admission: EM | Admit: 2012-01-23 | Discharge: 2012-01-25 | DRG: 378 | Disposition: A | Discharge status: Home / Self Care | Payer: Medicare Other | Attending: Internal Medicine | Admitting: Internal Medicine

## 2012-01-23 DIAGNOSIS — K922 Gastrointestinal hemorrhage, unspecified: Principal | ICD-10-CM | POA: Diagnosis present

## 2012-01-23 DIAGNOSIS — K259 Gastric ulcer, unspecified as acute or chronic, without hemorrhage or perforation: Secondary | ICD-10-CM | POA: Diagnosis not present

## 2012-01-23 DIAGNOSIS — D500 Iron deficiency anemia secondary to blood loss (chronic): Secondary | ICD-10-CM | POA: Diagnosis not present

## 2012-01-23 DIAGNOSIS — D62 Acute posthemorrhagic anemia: Secondary | ICD-10-CM | POA: Diagnosis present

## 2012-01-23 DIAGNOSIS — K219 Gastro-esophageal reflux disease without esophagitis: Secondary | ICD-10-CM | POA: Diagnosis present

## 2012-01-23 DIAGNOSIS — I10 Essential (primary) hypertension: Secondary | ICD-10-CM | POA: Diagnosis not present

## 2012-01-23 DIAGNOSIS — Z8673 Personal history of transient ischemic attack (TIA), and cerebral infarction without residual deficits: Secondary | ICD-10-CM | POA: Diagnosis not present

## 2012-01-23 DIAGNOSIS — G4730 Sleep apnea, unspecified: Secondary | ICD-10-CM

## 2012-01-23 DIAGNOSIS — D649 Anemia, unspecified: Secondary | ICD-10-CM | POA: Diagnosis present

## 2012-01-23 DIAGNOSIS — Z6835 Body mass index (BMI) 35.0-35.9, adult: Secondary | ICD-10-CM

## 2012-01-23 DIAGNOSIS — Z791 Long term (current) use of non-steroidal anti-inflammatories (NSAID): Secondary | ICD-10-CM | POA: Diagnosis not present

## 2012-01-23 DIAGNOSIS — Z79899 Other long term (current) drug therapy: Secondary | ICD-10-CM

## 2012-01-23 DIAGNOSIS — E669 Obesity, unspecified: Secondary | ICD-10-CM | POA: Diagnosis present

## 2012-01-23 DIAGNOSIS — T39095A Adverse effect of salicylates, initial encounter: Secondary | ICD-10-CM | POA: Diagnosis present

## 2012-01-23 DIAGNOSIS — I4891 Unspecified atrial fibrillation: Secondary | ICD-10-CM | POA: Diagnosis present

## 2012-01-23 DIAGNOSIS — T3995XA Adverse effect of unspecified nonopioid analgesic, antipyretic and antirheumatic, initial encounter: Secondary | ICD-10-CM | POA: Diagnosis present

## 2012-01-23 DIAGNOSIS — Z7982 Long term (current) use of aspirin: Secondary | ICD-10-CM

## 2012-01-23 DIAGNOSIS — J449 Chronic obstructive pulmonary disease, unspecified: Secondary | ICD-10-CM | POA: Diagnosis not present

## 2012-01-23 DIAGNOSIS — K2970 Gastritis, unspecified, without bleeding: Secondary | ICD-10-CM | POA: Diagnosis not present

## 2012-01-23 DIAGNOSIS — K253 Acute gastric ulcer without hemorrhage or perforation: Secondary | ICD-10-CM | POA: Diagnosis not present

## 2012-01-23 DIAGNOSIS — J4489 Other specified chronic obstructive pulmonary disease: Secondary | ICD-10-CM | POA: Diagnosis present

## 2012-01-23 DIAGNOSIS — R062 Wheezing: Secondary | ICD-10-CM | POA: Diagnosis not present

## 2012-01-23 DIAGNOSIS — K921 Melena: Secondary | ICD-10-CM | POA: Diagnosis not present

## 2012-01-23 DIAGNOSIS — R0602 Shortness of breath: Secondary | ICD-10-CM

## 2012-01-23 DIAGNOSIS — G4733 Obstructive sleep apnea (adult) (pediatric): Secondary | ICD-10-CM | POA: Diagnosis present

## 2012-01-23 DIAGNOSIS — Z7901 Long term (current) use of anticoagulants: Secondary | ICD-10-CM

## 2012-01-23 LAB — COMPREHENSIVE METABOLIC PANEL
ALT: 11 U/L (ref 0–35)
BUN: 34 mg/dL — ABNORMAL HIGH (ref 6–23)
CO2: 27 mEq/L (ref 19–32)
Calcium: 8.5 mg/dL (ref 8.4–10.5)
Creatinine, Ser: 0.9 mg/dL (ref 0.50–1.10)
GFR calc Af Amer: 69 mL/min — ABNORMAL LOW (ref >90)
GFR calc non Af Amer: 60 mL/min — ABNORMAL LOW (ref >90)
Glucose, Bld: 140 mg/dL — ABNORMAL HIGH (ref 70–99)

## 2012-01-23 LAB — CBC WITH DIFFERENTIAL/PLATELET
Eosinophils Relative: 4 % (ref 0–5)
HCT: 23.2 % — ABNORMAL LOW (ref 36.0–46.0)
Lymphocytes Relative: 33 % (ref 12–46)
Lymphs Abs: 2.8 10*3/uL (ref 0.7–4.0)
MCV: 95.1 fL (ref 78.0–100.0)
Monocytes Absolute: 0.7 10*3/uL (ref 0.1–1.0)
Monocytes Relative: 9 % (ref 3–12)
RBC: 2.44 MIL/uL — ABNORMAL LOW (ref 3.87–5.11)
WBC: 8.3 10*3/uL (ref 4.0–10.5)

## 2012-01-23 LAB — CBC
MCHC: 32.9 g/dL (ref 30.0–36.0)
Platelets: 176 10*3/uL (ref 150–400)
RDW: 15.8 % — ABNORMAL HIGH (ref 11.5–15.5)
WBC: 7.4 10*3/uL (ref 4.0–10.5)

## 2012-01-23 LAB — PROTIME-INR: INR: 2.37 — ABNORMAL HIGH (ref 0.00–1.49)

## 2012-01-23 LAB — OCCULT BLOOD X 1 CARD TO LAB, STOOL: Fecal Occult Bld: POSITIVE

## 2012-01-23 MED ORDER — ATORVASTATIN CALCIUM 20 MG PO TABS
20.0000 mg | ORAL_TABLET | Freq: Every day | ORAL | Status: DC
  Administered 2012-01-24: 20 mg via ORAL
  Filled 2012-01-23 (×2): qty 1

## 2012-01-23 MED ORDER — PANTOPRAZOLE SODIUM 40 MG IV SOLR
8.0000 mg/h | INTRAVENOUS | Status: DC
  Administered 2012-01-23: 8 mg/h via INTRAVENOUS
  Filled 2012-01-23 (×3): qty 80

## 2012-01-23 MED ORDER — EZETIMIBE 10 MG PO TABS
10.0000 mg | ORAL_TABLET | Freq: Every day | ORAL | Status: DC
  Administered 2012-01-23 – 2012-01-25 (×2): 10 mg via ORAL
  Filled 2012-01-23 (×3): qty 1

## 2012-01-23 MED ORDER — DILTIAZEM HCL ER 60 MG PO CP12: 120.0000 mg | ORAL_CAPSULE | Freq: Every day | ORAL | Status: DC

## 2012-01-23 MED ORDER — LEVALBUTEROL HCL 0.63 MG/3ML IN NEBU: 0.6300 mg | INHALATION_SOLUTION | Freq: Once | RESPIRATORY_TRACT | Status: DC

## 2012-01-23 MED ORDER — DILTIAZEM HCL ER 60 MG PO CP12
120.0000 mg | ORAL_CAPSULE | Freq: Two times a day (BID) | ORAL | Status: DC
  Filled 2012-01-23 (×7): qty 2

## 2012-01-23 MED ORDER — BROMFENAC SODIUM (ONCE-DAILY) 0.09 % OP SOLN: 1.0000 [drp] | Freq: Every day | OPHTHALMIC | Status: DC

## 2012-01-23 MED ORDER — SODIUM CHLORIDE 0.9 % IV SOLN
80.0000 mg | Freq: Once | INTRAVENOUS | Status: AC
  Administered 2012-01-23: 80 mg via INTRAVENOUS
  Filled 2012-01-23: qty 80

## 2012-01-23 MED ORDER — LEVALBUTEROL HCL 1.25 MG/0.5ML IN NEBU
INHALATION_SOLUTION | RESPIRATORY_TRACT | Status: AC
  Administered 2012-01-23: 1.25 mg
  Filled 2012-01-23: qty 0.5

## 2012-01-23 MED ORDER — METOPROLOL TARTRATE 50 MG PO TABS
50.0000 mg | ORAL_TABLET | Freq: Two times a day (BID) | ORAL | Status: DC
  Administered 2012-01-23 – 2012-01-25 (×3): 50 mg via ORAL
  Filled 2012-01-23 (×5): qty 1

## 2012-01-23 MED ORDER — LEVALBUTEROL TARTRATE 45 MCG/ACT IN AERO
1.0000 | INHALATION_SPRAY | RESPIRATORY_TRACT | Status: DC | PRN
  Filled 2012-01-23: qty 15

## 2012-01-23 MED ORDER — ALBUTEROL SULFATE (5 MG/ML) 0.5% IN NEBU: 2.5000 mg | INHALATION_SOLUTION | Freq: Four times a day (QID) | RESPIRATORY_TRACT | Status: DC

## 2012-01-23 MED ORDER — ACETAMINOPHEN 650 MG RE SUPP: 650.0000 mg | Freq: Four times a day (QID) | RECTAL | Status: DC | PRN

## 2012-01-23 MED ORDER — SODIUM CHLORIDE 0.9 % IV SOLN
INTRAVENOUS | Status: DC
  Administered 2012-01-23: 10 mL via INTRAVENOUS

## 2012-01-23 MED ORDER — PREDNISOLONE ACETATE 1 % OP SUSP
1.0000 [drp] | Freq: Every day | OPHTHALMIC | Status: DC
  Filled 2012-01-23: qty 1

## 2012-01-23 MED ORDER — SODIUM CHLORIDE 0.9 % IJ SOLN
3.0000 mL | Freq: Two times a day (BID) | INTRAMUSCULAR | Status: DC
  Administered 2012-01-23: 3 mL via INTRAVENOUS

## 2012-01-23 MED ORDER — ONDANSETRON HCL 4 MG PO TABS: 4.0000 mg | ORAL_TABLET | Freq: Four times a day (QID) | ORAL | Status: DC | PRN

## 2012-01-23 MED ORDER — ACETAMINOPHEN 325 MG PO TABS
650.0000 mg | ORAL_TABLET | Freq: Four times a day (QID) | ORAL | Status: DC | PRN
  Administered 2012-01-24 (×2): 650 mg via ORAL
  Filled 2012-01-23 (×2): qty 2

## 2012-01-23 MED ORDER — ONDANSETRON HCL 4 MG/2ML IJ SOLN: 4.0000 mg | Freq: Four times a day (QID) | INTRAMUSCULAR | Status: DC | PRN

## 2012-01-23 MED ORDER — MORPHINE SULFATE 2 MG/ML IJ SOLN
1.0000 mg | Freq: Once | INTRAMUSCULAR | Status: AC
  Administered 2012-01-23: 1 mg via INTRAVENOUS
  Filled 2012-01-23: qty 1

## 2012-01-23 NOTE — ED Provider Notes (Deleted)
Medical screening examination/treatment/procedure(s) were conducted as a shared visit with non-physician practitioner(s) and myself.  I personally evaluated the patient during the encounter     Dot Lanes, MD 01/23/12 1929

## 2012-01-23 NOTE — ED Provider Notes (Signed)
Medical screening examination/treatment/procedure(s) were performed by non-physician practitioner and as supervising physician I was immediately available for consultation/collaboration.    Dot Lanes, MD 01/23/12 [phone]

## 2012-01-23 NOTE — ED Notes (Signed)
Pt reports he was seen yesterday for choking on yogurt-today increased SOB-called PCP with ?if inhaler would cause black stools and was advised to come to ED-reports black stool x 1 yesterday and x 1 today

## 2012-01-23 NOTE — Progress Notes (Signed)
Spoke with PA-C Caryl Ada about pt. 78 year old female who presented to [facility] yesterday complaining of shortness of breath after choking on yogurt. Apparently, the patient improved after Xopenex. The patient was sent home with Xopenex.  The patient continued to have shortness of breath. No syncope or chest pain. She called her primary care physician this morning. She notified him that she was also having melena which is new. She was to go back to [facility]. Blood work revealed hemoglobin 7.6. Hemoglobin baseline between 11-12. Patient is on warfarin for atrial fibrillation. INR 2.37. Patient is hemodynamically stable. Last blood pressure 180/86 heart rate 95. Patient accepted for telemetry bed.

## 2012-01-23 NOTE — ED Notes (Signed)
KAREN SOFIA CHAPERONED FOR RECTAL EXAM, STOOL IS BLACK WITH STRONG ODOR OF GI BLEED NOTED BY THIS RN. PT TOLERATED WELL, DENIES ANY C/O AT THIS TIME.

## 2012-01-23 NOTE — H&P (Addendum)
Lynn Dunn is an 78 y.o. female.   Patient was seen and examined on January 23, 2012. PCP - Dr. Briscoe Deutscher. Cardiologist - Dr. Thompson Grayer. Chief Complaint: Black stools. HPI: 78 year-old female with history of paroxysmal atrial fibrillation status post ablation on Coumadin, history of CVA, OSA, hypertension, COPD presented to the ER today at [facility] because of having 2 bowel movements which were black. Patient at that time also felt weak and dizzy but did not pass out. Patient has never had black stools previously. Patient had mild epigastric discomfort. In the ER patient's hemoglobin is found to be around 7.6 with a therapeutic INR but patient was hemodynamically stable. Patient has been transferred to [facility] for further management. Patient states that over the last one month patient has been taking NSAIDs for generalized body ache. Denies any nausea vomiting chest pain or shortness of breath. Patient states she had a coloscopy 2-3 years ago which was normal.  2 days ago patient has a choking spell while having your with. Since then she became mildly short of breath and had gone to her PCP who had prescribed her Xopenex inhaler.  Past Medical History  Diagnosis Date  . HTN (hypertension)   . PAF (paroxysmal atrial fibrillation)     s/p ablation 07/29/09  . COPD (chronic obstructive pulmonary disease)   . GERD (gastroesophageal reflux disease)   . Obesity   . Stroke 6/09  . OSA (obstructive sleep apnea)     Past Surgical History  Procedure Date  . Atrial ablation surgery     s/p ablation for afib 07/29/09  . Abdominal hysterectomy     Family History  Problem Relation Age of Onset  . Breast cancer    . Brain cancer     Social History:  reports that she has never smoked. She does not have any smokeless tobacco history on file. She reports that she does not drink alcohol or use illicit drugs.  Allergies:  Allergies  Allergen Reactions  . Albuterol    Atrial fibrillation    Medications Prior to Admission  Medication Sig Dispense Refill  . aspirin 81 MG tablet Take 81 mg by mouth daily.        Marland Kitchen BROMDAY 0.09 % SOLN Place 1 drop into both eyes daily.       . Cholecalciferol (VITAMIN D PO) Take by mouth.      . Coenzyme Q10 (COQ10) 200 MG CAPS 1 tab po qd       . CRESTOR 10 MG tablet Take 10 mg by mouth daily.       Marland Kitchen diltiazem (CARDIZEM SR) 120 MG 12 hr capsule Take 1 capsule (120 mg total) by mouth daily.  60 capsule  11  . furosemide (LASIX) 40 MG tablet Take 40 mg by mouth daily.        Marland Kitchen levalbuterol (XOPENEX HFA) 45 MCG/ACT inhaler Inhale 1-2 puffs into the lungs every 4 (four) hours as needed for wheezing or shortness of breath (or cough; patient intolerant to plain albuterol).  1 Inhaler  0  . metoprolol (LOPRESSOR) 50 MG tablet Take 50 mg by mouth 2 (two) times daily.       . potassium chloride (K-DUR) 10 MEQ tablet Take 10 mEq by mouth 2 (two) times daily.       . prednisoLONE acetate (PRED FORTE) 1 % ophthalmic suspension Place 1 drop into both eyes daily.       . vitamin B-12 (CYANOCOBALAMIN)  1000 MCG tablet Take 1,000 mcg by mouth daily.        Marland Kitchen warfarin (COUMADIN) 4 MG tablet As directed       . ZETIA 10 MG tablet Take 10 mg by mouth daily.         Results for orders placed during the hospital encounter of 01/23/12 (from the past 48 hour(s))  CBC WITH DIFFERENTIAL     Status: Abnormal   Collection Time   01/23/12  5:37 PM      Component Value Range Comment   WBC 8.3  4.0 - 10.5 K/uL    RBC 2.44 (*) 3.87 - 5.11 MIL/uL    Hemoglobin 7.6 (*) 12.0 - 15.0 g/dL    HCT 23.2 (*) 36.0 - 46.0 %    MCV 95.1  78.0 - 100.0 fL    MCH 31.1  26.0 - 34.0 pg    MCHC 32.8  30.0 - 36.0 g/dL    RDW 15.7 (*) 11.5 - 15.5 %    Platelets 207  150 - 400 K/uL    Neutrophils Relative 54  43 - 77 %    Neutro Abs 4.5  1.7 - 7.7 K/uL    Lymphocytes Relative 33  12 - 46 %    Lymphs Abs 2.8  0.7 - 4.0 K/uL    Monocytes Relative 9  3 - 12 %     Monocytes Absolute 0.7  0.1 - 1.0 K/uL    Eosinophils Relative 4  0 - 5 %    Eosinophils Absolute 0.3  0.0 - 0.7 K/uL    Basophils Relative 0  0 - 1 %    Basophils Absolute 0.0  0.0 - 0.1 K/uL   COMPREHENSIVE METABOLIC PANEL     Status: Abnormal   Collection Time   01/23/12  5:37 PM      Component Value Range Comment   Sodium 139  135 - 145 mEq/L    Potassium 3.5  3.5 - 5.1 mEq/L    Chloride 102  96 - 112 mEq/L    CO2 27  19 - 32 mEq/L    Glucose, Bld 140 (*) 70 - 99 mg/dL    BUN 34 (*) 6 - 23 mg/dL    Creatinine, Ser 0.90  0.50 - 1.10 mg/dL    Calcium 8.5  8.4 - 10.5 mg/dL    Total Protein 6.1  6.0 - 8.3 g/dL    Albumin 3.6  3.5 - 5.2 g/dL    AST 14  0 - 37 U/L    ALT 11  0 - 35 U/L    Alkaline Phosphatase 82  39 - 117 U/L    Total Bilirubin 0.2 (*) 0.3 - 1.2 mg/dL    GFR calc non Af Amer 60 (*) >90 mL/min    GFR calc Af Amer 69 (*) >90 mL/min   PROTIME-INR     Status: Abnormal   Collection Time   01/23/12  5:37 PM      Component Value Range Comment   Prothrombin Time 24.8 (*) 11.6 - 15.2 seconds    INR 2.37 (*) 0.00 - 1.49   OCCULT BLOOD X 1 CARD TO LAB, STOOL     Status: Normal   Collection Time   01/23/12  5:43 PM      Component Value Range Comment   Fecal Occult Bld POSITIVE      Dg Chest 2 View  01/21/2012  *RADIOLOGY REPORT*  Clinical Data:  Shortness of breath, cough,  patient states she aspirated yogurt  CHEST - 2 VIEW  Comparison: 12/24/2009  Findings: Enlargement of cardiac silhouette. Slight, right paratracheal soft tissues unchanged since 2010. Tortuous aorta with atherosclerotic calcification at arch. Pulmonary vascularity normal. Minimal right basilar atelectasis, chronic. No definite infiltrate, pleural effusion or pneumothorax. Bones appear diffusely demineralized.  IMPRESSION: Enlargement of cardiac silhouette. Minimal chronic right basilar atelectasis.   Original Report Authenticated By: Lavonia Dana, M.D.     Review of Systems  HENT: Negative.   Eyes:  Negative.   Respiratory: Negative.   Cardiovascular: Negative.   Gastrointestinal: Positive for melena.  Genitourinary: Negative.   Musculoskeletal: Negative.   Skin: Negative.   Neurological: Positive for dizziness and weakness.  Psychiatric/Behavioral: Negative.     Blood pressure 149/66, pulse 91, temperature 98.5 F (36.9 C), temperature source Oral, resp. rate 17, height 5\' 2"  (1.575 m), weight 87.544 kg (193 lb), SpO2 99.00%. Physical Exam  Constitutional: She is oriented to person, place, and time. She appears well-developed and well-nourished. No distress.  HENT:  Head: Normocephalic and atraumatic.  Right Ear: External ear normal.  Left Ear: External ear normal.  Nose: Nose normal.  Mouth/Throat: Oropharynx is clear and moist. No oropharyngeal exudate.  Eyes: Conjunctivae normal are normal. Pupils are equal, round, and reactive to light. Right eye exhibits no discharge. Left eye exhibits no discharge. No scleral icterus.  Neck: Normal range of motion. Neck supple.  Cardiovascular: Normal rate and regular rhythm.   Respiratory: Effort normal and breath sounds normal. No respiratory distress. She has no wheezes. She has no rales.  GI: Soft. Bowel sounds are normal. She exhibits no distension. There is no tenderness. There is no rebound.  Musculoskeletal: She exhibits no edema and no tenderness.  Neurological: She is alert and oriented to person, place, and time.       Moves all extremities.  Skin: Skin is warm and dry. She is not diaphoretic.     Assessment/Plan #1. GI bleed most likely upper GI - at this time patient will be kept n.p.o. except medications. Start patient on Protonix infusion. Transfuse 2 units of PRBC. We'll transfuse one in her FFP to reverse her INR but we'll hold off vitamin K given that patient has had a stroke and history of A. fib. Consult GI in a.m. presently patient is hemodynamically stable. #2. Acute blood loss anemia secondary to #1 reason. #3.  Atrial fibrillation status post ablation on Coumadin - since patient has had a stroke previously at this time we will hold Coumadin and only give FFP for now. Hold off vitamin K for now. Continue her rate limiting medications. Check INR in a.m. #4. History of CVA with history of atrial fibrillation on Coumadin - see #1 and #3. #5. Hypertension - we'll hold her Lasix for now we'll continue patient's rate limiting medications for atrial fibrillation. #6. COPD - Patient is mildly wheezing at this time. Check chest x-ray and also will place patient on Lovenox nebulizer. Patient did have a choking spell 2 days ago after which patient is getting more short of breath and was placed on Xopenex HFA.  #7. History of OSA on CPAP  - CPAP per respiratory.   I did discuss with patient about the risk of having stroke holding her Coumadin. Patient is agreeing to hold Coumadin and reversing it given her GI bleed.  CODE STATUS - full code.   Rise Patience 01/23/2012, 9:41 PM Addendum - Consulted gastroenterologist Dr.Schooler.  Gean Birchwood.

## 2012-01-23 NOTE — ED Provider Notes (Addendum)
History     CSN: [id number]  Arrival date & time 01/23/12  1701   First MD Initiated Contact with Patient 01/23/12 1714      Chief Complaint  Patient presents with  . Melena    (Consider location/radiation/quality/duration/timing/severity/associated sxs/prior treatment) Patient is a 78 y.o. female presenting with hematochezia. The history is provided by the patient. No language interpreter was used.  Rectal Bleeding  The current episode started yesterday. The onset was gradual. The problem occurs continuously. The problem has been gradually worsening. The patient is experiencing no pain. The stool is described as soft (black). There was no prior unsuccessful therapy. There were no sick contacts. Recently, medical care has been given at this facility.  Pt was seen here yesterday after getting choked on yogurt. Pt was given xopenex neb.   Pt reports she continued to feel short of breath today.Pt called her primary MD who she asked if xopenex caused black stool.   MD sent pt here for evaluation.  She is on coumadin  Past Medical History  Diagnosis Date  . HTN (hypertension)   . PAF (paroxysmal atrial fibrillation)     s/p ablation 07/29/09  . COPD (chronic obstructive pulmonary disease)   . GERD (gastroesophageal reflux disease)   . Obesity   . Stroke 6/09  . OSA (obstructive sleep apnea)     Past Surgical History  Procedure Date  . Atrial ablation surgery     s/p ablation for afib 07/29/09  . Abdominal hysterectomy     Family History  Problem Relation Age of Onset  . Breast cancer    . Brain cancer      History  Substance Use Topics  . Smoking status: Never Smoker   . Smokeless tobacco: Not on file  . Alcohol Use: No    OB History    Grav Para Term Preterm Abortions TAB SAB Ect Mult Living                  Review of Systems  Respiratory: Positive for shortness of breath and wheezing.   Gastrointestinal: Positive for blood in stool and hematochezia.     Allergies  Albuterol  Home Medications   Current Outpatient Rx  Name  Route  Sig  Dispense  Refill  . ASPIRIN 81 MG PO TABS   Oral   Take 81 mg by mouth daily.           Marland Kitchen BROMDAY 0.09 % OP SOLN   Both Eyes   Place 1 drop into both eyes daily.          Marland Kitchen VITAMIN D PO   Oral   Take by mouth.         . COQ10 200 MG PO CAPS      1 tab po qd          . CRESTOR 10 MG PO TABS   Oral   Take 10 mg by mouth daily.          Marland Kitchen DILTIAZEM HCL ER 120 MG PO CP12   Oral   Take 1 capsule (120 mg total) by mouth daily.   60 capsule   11   . FUROSEMIDE 40 MG PO TABS   Oral   Take 40 mg by mouth daily.           Marland Kitchen LEVALBUTEROL TARTRATE 45 MCG/ACT IN AERO   Inhalation   Inhale 1-2 puffs into the lungs every 4 (four) hours as needed for  wheezing or shortness of breath (or cough; patient intolerant to plain albuterol).   1 Inhaler   0   . METOPROLOL TARTRATE 50 MG PO TABS   Oral   Take 50 mg by mouth 2 (two) times daily.          Marland Kitchen POTASSIUM CHLORIDE ER 10 MEQ PO TBCR   Oral   Take 10 mEq by mouth 2 (two) times daily.          Marland Kitchen PREDNISOLONE ACETATE 1 % OP SUSP   Both Eyes   Place 1 drop into both eyes daily.          Marland Kitchen VITAMIN B-12 1000 MCG PO TABS   Oral   Take 1,000 mcg by mouth daily.           . WARFARIN SODIUM 4 MG PO TABS      As directed          . ZETIA 10 MG PO TABS   Oral   Take 10 mg by mouth daily.            BP 140/64  Pulse 97  Temp 98.5 F (36.9 C) (Oral)  Resp 16  Ht 5\' 2"  (1.575 m)  Wt 193 lb (87.544 kg)  BMI 35.30 kg/m2  SpO2 98%  Physical Exam  Nursing note and vitals reviewed. Constitutional: She is oriented to person, place, and time. She appears well-developed and well-nourished.  HENT:  Head: Normocephalic.  Right Ear: External ear normal.  Left Ear: External ear normal.  Eyes: Conjunctivae normal are normal. Pupils are equal, round, and reactive to light.  Neck: Normal range of motion. Neck supple.   Cardiovascular: Normal rate, regular rhythm and normal heart sounds.   Pulmonary/Chest: Effort normal and breath sounds normal.  Abdominal: Soft. Bowel sounds are normal.  Genitourinary: Guaiac positive stool.  Musculoskeletal: Normal range of motion.  Neurological: She is alert and oriented to person, place, and time.  Skin: Skin is warm and dry.  Psychiatric: She has a normal mood and affect.    ED Course  Procedures (including critical care time)  Labs Reviewed  CBC WITH DIFFERENTIAL - Abnormal; Notable for the following:    RBC 2.44 (*)     Hemoglobin 7.6 (*)     HCT 23.2 (*)     RDW 15.7 (*)     All other components within normal limits  PROTIME-INR - Abnormal; Notable for the following:    Prothrombin Time 24.8 (*)     INR 2.37 (*)     All other components within normal limits  OCCULT BLOOD X 1 CARD TO LAB, STOOL  COMPREHENSIVE METABOLIC PANEL   Dg Chest 2 View  01/21/2012  *RADIOLOGY REPORT*  Clinical Data:  Shortness of breath, cough, patient states she aspirated yogurt  CHEST - 2 VIEW  Comparison: 12/24/2009  Findings: Enlargement of cardiac silhouette. Slight, right paratracheal soft tissues unchanged since 2010. Tortuous aorta with atherosclerotic calcification at arch. Pulmonary vascularity normal. Minimal right basilar atelectasis, chronic. No definite infiltrate, pleural effusion or pneumothorax. Bones appear diffusely demineralized.  IMPRESSION: Enlargement of cardiac silhouette. Minimal chronic right basilar atelectasis.   Original Report Authenticated By: Lavonia Dana, M.D.      No diagnosis found.    MDM   Results for orders placed during the hospital encounter of 01/23/12  CBC WITH DIFFERENTIAL      Component Value Range   WBC 8.3  4.0 - 10.5 K/uL   RBC 2.44 (*)  3.87 - 5.11 MIL/uL   Hemoglobin 7.6 (*) 12.0 - 15.0 g/dL   HCT 23.2 (*) 36.0 - 46.0 %   MCV 95.1  78.0 - 100.0 fL   MCH 31.1  26.0 - 34.0 pg   MCHC 32.8  30.0 - 36.0 g/dL   RDW 15.7 (*) 11.5  - 15.5 %   Platelets 207  150 - 400 K/uL   Neutrophils Relative 54  43 - 77 %   Neutro Abs 4.5  1.7 - 7.7 K/uL   Lymphocytes Relative 33  12 - 46 %   Lymphs Abs 2.8  0.7 - 4.0 K/uL   Monocytes Relative 9  3 - 12 %   Monocytes Absolute 0.7  0.1 - 1.0 K/uL   Eosinophils Relative 4  0 - 5 %   Eosinophils Absolute 0.3  0.0 - 0.7 K/uL   Basophils Relative 0  0 - 1 %   Basophils Absolute 0.0  0.0 - 0.1 K/uL  COMPREHENSIVE METABOLIC PANEL      Component Value Range   Sodium 139  135 - 145 mEq/L   Potassium 3.5  3.5 - 5.1 mEq/L   Chloride 102  96 - 112 mEq/L   CO2 27  19 - 32 mEq/L   Glucose, Bld 140 (*) 70 - 99 mg/dL   BUN 34 (*) 6 - 23 mg/dL   Creatinine, Ser 0.90  0.50 - 1.10 mg/dL   Calcium 8.5  8.4 - 10.5 mg/dL   Total Protein 6.1  6.0 - 8.3 g/dL   Albumin 3.6  3.5 - 5.2 g/dL   AST 14  0 - 37 U/L   ALT 11  0 - 35 U/L   Alkaline Phosphatase 82  39 - 117 U/L   Total Bilirubin 0.2 (*) 0.3 - 1.2 mg/dL   GFR calc non Af Amer 60 (*) >90 mL/min   GFR calc Af Amer 69 (*) >90 mL/min  PROTIME-INR      Component Value Range   Prothrombin Time 24.8 (*) 11.6 - 15.2 seconds   INR 2.37 (*) 0.00 - 1.49  OCCULT BLOOD X 1 CARD TO LAB, STOOL      Component Value Range   Fecal Occult Bld POSITIVE     Dg Chest 2 View  01/21/2012  *RADIOLOGY REPORT*  Clinical Data:  Shortness of breath, cough, patient states she aspirated yogurt  CHEST - 2 VIEW  Comparison: 12/24/2009  Findings: Enlargement of cardiac silhouette. Slight, right paratracheal soft tissues unchanged since 2010. Tortuous aorta with atherosclerotic calcification at arch. Pulmonary vascularity normal. Minimal right basilar atelectasis, chronic. No definite infiltrate, pleural effusion or pneumothorax. Bones appear diffusely demineralized.  IMPRESSION: Enlargement of cardiac silhouette. Minimal chronic right basilar atelectasis.   Original Report Authenticated By: Lavonia Dana, M.D.      Date: 01/23/2012  Rate: 95  Rhythm: normal sinus  rhythm  QRS Axis: normal  Intervals: normal  ST/T Wave abnormalities: normal  Conduction Disutrbances:first-degree A-V block   Narrative Interpretation:   Old EKG Reviewed: none available        Fransico Meadow, PA 01/23/12 [place], [place] 01/23/12 1844

## 2012-01-23 NOTE — ED Provider Notes (Deleted)
Medical screening examination/treatment/procedure(s) were performed by non-physician practitioner and as supervising physician I was immediately available for consultation/collaboration.    Dot Lanes, MD 01/23/12 [phone]

## 2012-01-23 NOTE — ED Provider Notes (Signed)
Medical screening examination/treatment/procedure(s) were conducted as a shared visit with non-physician practitioner(s) and myself.  I personally evaluated the patient during the encounter    Dot Lanes, MD 01/23/12 2009

## 2012-01-23 NOTE — ED Notes (Signed)
Attempted to call report to receiving nurse at [facility] but she is not available for report. Left number for call-back.

## 2012-01-24 ENCOUNTER — Encounter (HOSPITAL_COMMUNITY): Payer: Self-pay | Admitting: Gastroenterology

## 2012-01-24 ENCOUNTER — Inpatient Hospital Stay (HOSPITAL_COMMUNITY): Payer: Medicare Other

## 2012-01-24 ENCOUNTER — Encounter (HOSPITAL_COMMUNITY)
Admission: EM | Disposition: A | Discharge status: Home / Self Care | Payer: Self-pay | Source: Home / Self Care | Attending: Internal Medicine

## 2012-01-24 HISTORY — PX: ESOPHAGOGASTRODUODENOSCOPY: SHX5428

## 2012-01-24 LAB — COMPREHENSIVE METABOLIC PANEL
ALT: 10 U/L (ref 0–35)
Calcium: 8.5 mg/dL (ref 8.4–10.5)
GFR calc Af Amer: >90 mL/min (ref >90)
Glucose, Bld: 130 mg/dL — ABNORMAL HIGH (ref 70–99)
Sodium: 140 mEq/L (ref 135–145)
Total Protein: 6 g/dL (ref 6.0–8.3)

## 2012-01-24 LAB — CBC
HCT: 28.5 % — ABNORMAL LOW (ref 36.0–46.0)
Hemoglobin: 9.5 g/dL — ABNORMAL LOW (ref 12.0–15.0)
MCH: 30.2 pg (ref 26.0–34.0)
MCHC: 33 g/dL (ref 30.0–36.0)
MCV: 91.3 fL (ref 78.0–100.0)
Platelets: 163 10*3/uL (ref 150–400)
RDW: 16.4 % — ABNORMAL HIGH (ref 11.5–15.5)
WBC: 6.8 10*3/uL (ref 4.0–10.5)

## 2012-01-24 LAB — TROPONIN I: Troponin I: <0.3 ng/mL (ref <0.30)

## 2012-01-24 SURGERY — EGD (ESOPHAGOGASTRODUODENOSCOPY)
Anesthesia: Moderate Sedation

## 2012-01-24 MED ORDER — DEXTROSE 50 % IV SOLN: 50.0000 mL | Freq: Once | INTRAVENOUS | Status: AC | PRN

## 2012-01-24 MED ORDER — PANTOPRAZOLE SODIUM 40 MG PO TBEC
40.0000 mg | DELAYED_RELEASE_TABLET | Freq: Two times a day (BID) | ORAL | Status: DC
  Administered 2012-01-24 – 2012-01-25 (×2): 40 mg via ORAL
  Filled 2012-01-24 (×4): qty 1

## 2012-01-24 MED ORDER — DEXTROSE 50 % IV SOLN: 25.0000 mL | Freq: Once | INTRAVENOUS | Status: AC | PRN

## 2012-01-24 MED ORDER — LEVALBUTEROL HCL 0.63 MG/3ML IN NEBU
0.6300 mg | INHALATION_SOLUTION | Freq: Four times a day (QID) | RESPIRATORY_TRACT | Status: DC
  Administered 2012-01-24 – 2012-01-25 (×4): 0.63 mg via RESPIRATORY_TRACT
  Filled 2012-01-24 (×10): qty 3

## 2012-01-24 MED ORDER — GI COCKTAIL ~~LOC~~
30.0000 mL | Freq: Three times a day (TID) | ORAL | Status: DC | PRN
  Administered 2012-01-24: 30 mL via ORAL
  Filled 2012-01-24: qty 30

## 2012-01-24 MED ORDER — INSULIN ASPART 100 UNIT/ML ~~LOC~~ SOLN: 0.0000 [IU] | Freq: Three times a day (TID) | SUBCUTANEOUS | Status: DC

## 2012-01-24 MED ORDER — FENTANYL CITRATE 0.05 MG/ML IJ SOLN
INTRAMUSCULAR | Status: DC | PRN
  Administered 2012-01-24 (×2): 25 ug via INTRAVENOUS

## 2012-01-24 MED ORDER — SODIUM CHLORIDE 0.9 % IV SOLN: INTRAVENOUS | Status: DC

## 2012-01-24 MED ORDER — TRAMADOL HCL 50 MG PO TABS
50.0000 mg | ORAL_TABLET | Freq: Four times a day (QID) | ORAL | Status: DC | PRN
  Administered 2012-01-24 (×2): 50 mg via ORAL
  Filled 2012-01-24 (×2): qty 1

## 2012-01-24 MED ORDER — BUTAMBEN-TETRACAINE-BENZOCAINE 2-2-14 % EX AERO
INHALATION_SPRAY | CUTANEOUS | Status: DC | PRN
  Administered 2012-01-24: 2 via TOPICAL

## 2012-01-24 MED ORDER — GLUCOSE 40 % PO GEL: 1.0000 | ORAL | Status: DC | PRN

## 2012-01-24 MED ORDER — MIDAZOLAM HCL 10 MG/2ML IJ SOLN
INTRAMUSCULAR | Status: DC | PRN
  Administered 2012-01-24: 2 mg via INTRAVENOUS
  Administered 2012-01-24 (×2): 1 mg via INTRAVENOUS

## 2012-01-24 MED ORDER — MIDAZOLAM HCL 5 MG/ML IJ SOLN
INTRAMUSCULAR | Status: AC
  Filled 2012-01-24: qty 2

## 2012-01-24 MED ORDER — FENTANYL CITRATE 0.05 MG/ML IJ SOLN
INTRAMUSCULAR | Status: AC
  Filled 2012-01-24: qty 2

## 2012-01-24 NOTE — Consult Note (Signed)
Reason for Consult: Melena Referring Physician: Hospital team  Lynn Dunn is an 78 y.o. female.  HPI: Patient without a previous GI history in an uneventful colonoscopy in 2007 his been on Coumadin and nonsteroidals for years without any GI symptoms until black stool started 2 days ago and she said increased shortness of breath and was found to be anemic and guaiac positive we are consulted for further workup and plans. Her family history is negative and her history was reviewed and she has no other complaints  Past Medical History  Diagnosis Date  . HTN (hypertension)   . PAF (paroxysmal atrial fibrillation)     s/p ablation 07/29/09  . COPD (chronic obstructive pulmonary disease)   . GERD (gastroesophageal reflux disease)   . Obesity   . Stroke 6/09  . OSA (obstructive sleep apnea)     Past Surgical History  Procedure Date  . Atrial ablation surgery     s/p ablation for afib 07/29/09  . Abdominal hysterectomy     Family History  Problem Relation Age of Onset  . Breast cancer    . Brain cancer      Social History:  reports that she has never smoked. She does not have any smokeless tobacco history on file. She reports that she does not drink alcohol or use illicit drugs.  Allergies:  Allergies  Allergen Reactions  . Albuterol     Atrial fibrillation    Medications: I have reviewed the patient's current medications.  Results for orders placed during the hospital encounter of 01/23/12 (from the past 48 hour(s))  CBC WITH DIFFERENTIAL     Status: Abnormal   Collection Time   01/23/12  5:37 PM      Component Value Range Comment   WBC 8.3  4.0 - 10.5 K/uL    RBC 2.44 (*) 3.87 - 5.11 MIL/uL    Hemoglobin 7.6 (*) 12.0 - 15.0 g/dL    HCT 23.2 (*) 36.0 - 46.0 %    MCV 95.1  78.0 - 100.0 fL    MCH 31.1  26.0 - 34.0 pg    MCHC 32.8  30.0 - 36.0 g/dL    RDW 15.7 (*) 11.5 - 15.5 %    Platelets 207  150 - 400 K/uL    Neutrophils Relative 54  43 - 77 %    Neutro Abs 4.5   1.7 - 7.7 K/uL    Lymphocytes Relative 33  12 - 46 %    Lymphs Abs 2.8  0.7 - 4.0 K/uL    Monocytes Relative 9  3 - 12 %    Monocytes Absolute 0.7  0.1 - 1.0 K/uL    Eosinophils Relative 4  0 - 5 %    Eosinophils Absolute 0.3  0.0 - 0.7 K/uL    Basophils Relative 0  0 - 1 %    Basophils Absolute 0.0  0.0 - 0.1 K/uL   COMPREHENSIVE METABOLIC PANEL     Status: Abnormal   Collection Time   01/23/12  5:37 PM      Component Value Range Comment   Sodium 139  135 - 145 mEq/L    Potassium 3.5  3.5 - 5.1 mEq/L    Chloride 102  96 - 112 mEq/L    CO2 27  19 - 32 mEq/L    Glucose, Bld 140 (*) 70 - 99 mg/dL    BUN 34 (*) 6 - 23 mg/dL    Creatinine, Ser 0.90  0.50 -  1.10 mg/dL    Calcium 8.5  8.4 - 10.5 mg/dL    Total Protein 6.1  6.0 - 8.3 g/dL    Albumin 3.6  3.5 - 5.2 g/dL    AST 14  0 - 37 U/L    ALT 11  0 - 35 U/L    Alkaline Phosphatase 82  39 - 117 U/L    Total Bilirubin 0.2 (*) 0.3 - 1.2 mg/dL    GFR calc non Af Amer 60 (*) >90 mL/min    GFR calc Af Amer 69 (*) >90 mL/min   PROTIME-INR     Status: Abnormal   Collection Time   01/23/12  5:37 PM      Component Value Range Comment   Prothrombin Time 24.8 (*) 11.6 - 15.2 seconds    INR 2.37 (*) 0.00 - 1.49   OCCULT BLOOD X 1 CARD TO LAB, STOOL     Status: Normal   Collection Time   01/23/12  5:43 PM      Component Value Range Comment   Fecal Occult Bld POSITIVE     CBC     Status: Abnormal   Collection Time   01/23/12 10:31 PM      Component Value Range Comment   WBC 7.4  4.0 - 10.5 K/uL    RBC 2.34 (*) 3.87 - 5.11 MIL/uL    Hemoglobin 7.1 (*) 12.0 - 15.0 g/dL    HCT 21.6 (*) 36.0 - 46.0 %    MCV 92.3  78.0 - 100.0 fL    MCH 30.3  26.0 - 34.0 pg    MCHC 32.9  30.0 - 36.0 g/dL    RDW 15.8 (*) 11.5 - 15.5 %    Platelets 176  150 - 400 K/uL   TYPE AND SCREEN     Status: Normal (Preliminary result)   Collection Time   01/23/12 10:35 PM      Component Value Range Comment   ABO/RH(D) A POS      Antibody Screen NEG       Sample Expiration 01/26/2012      Unit Number [id number]      Blood Component Type RED CELLS,LR      Unit division 00      Status of Unit ISSUED      Transfusion Status OK TO TRANSFUSE      Crossmatch Result Compatible      Unit Number [id number]      Blood Component Type RED CELLS,LR      Unit division 00      Status of Unit ISSUED      Transfusion Status OK TO TRANSFUSE      Crossmatch Result Compatible     ABO/RH     Status: Normal   Collection Time   01/23/12 10:35 PM      Component Value Range Comment   ABO/RH(D) A POS     PREPARE RBC (CROSSMATCH)     Status: Normal   Collection Time   01/24/12 12:00 AM      Component Value Range Comment   Order Confirmation ORDER PROCESSED BY BLOOD BANK     PREPARE FRESH FROZEN PLASMA     Status: Normal (Preliminary result)   Collection Time   01/24/12 12:00 AM      Component Value Range Comment   Unit Number [id number]      Blood Component Type THAWED PLASMA      Unit division 00      Status  of Unit ISSUED      Transfusion Status OK TO TRANSFUSE       Dg Chest Port 1 View  01/24/2012  *RADIOLOGY REPORT*  Clinical Data: Wheezing.  PORTABLE CHEST - 1 VIEW  Comparison: 01/21/2012.  Findings: Mild cardiomegaly. Prominent mitral valve calcifications. Calcified tortuous aorta.  Central pulmonary vascular prominence.  Limited for evaluation of mediastinal abnormality on this portable exam. The patient would eventually benefit from follow-up two-view chest with cardiac leads removed.  Minimal peribronchial thickening.  No infiltrate, congestive heart failure or pneumothorax.  IMPRESSION: Minimal peribronchial thickening.  No segmental consolidation.  Please see above.   Original Report Authenticated By: Genia Del, M.D.     ROS negative except above Blood pressure 141/61, pulse 74, temperature 98.3 F (36.8 C), temperature source Oral, resp. rate 20, height 5\' 2"  (1.575 m), weight 87.544 kg (193 lb), SpO2 97.00%. Physical Exam vital  signs stable afebrile no acute distress lungs have occasional expiratory wheeze decreased heart sounds abdomen is soft nontender labs and chest x-ray reviewed as was previous colonoscopy Assessment/Plan: Melena in a patient on Coumadin and nonsteroidals and multiple medical problems Plan: The risks benefits methods of endoscopy was discussed and we compared to the colonoscopy she has already had and will proceed this morning with further workup and plans pending those findings Clella Mckeel E 01/24/2012, 9:23 AM

## 2012-01-24 NOTE — Progress Notes (Signed)
Patient seen and examined. Agree with note by Dyanne Carrel, NP. EGD shows an antral ulcer without signs of active bleeding. Protonix has been transitioned to PO. Diet has been started. Per GI, it is probably safe to restart coumadin in a few days. If Hb stable overnight, will likely DC home in am with plans to restart coumadin in about 5 days.  Domingo Mend, MD [facility] Pager: [phone]

## 2012-01-24 NOTE — Progress Notes (Signed)
Pt is having some chest pain this morning, vitals signs are stable. BP 149/75 P 91 R 28 o2 sat 97% on 2l nasal canula. Paged physician and got an order for GI cocktail. EKG done during CP was essentially normal.Will continue to monitor closely.

## 2012-01-24 NOTE — Progress Notes (Signed)
Pt. seen and asked if wanting cpap set up for tonite, refused @ this time, told to notify if wanting set up this evening, plan to pass on in report handoff.

## 2012-01-24 NOTE — Progress Notes (Signed)
[facility] PROGRESS NOTE  Lynn Dunn [id number] DOB: 1930-06-21 DOA: 01/23/2012 PCP: Abigail Miyamoto, MD  Assessment/Plan: #1. GI bleed most likely upper GI - patient remains n.p.o. except medications awaiting GI recommendation. Continue Protonix infusion. s/p 2 units of PRBC. Awaiting one  FFP to reverse her INR. No vitamin K at this time given that patient has had a stroke and history of A. fib. Note indicates Dr. Michail Sermon notified. Will confirm.  #2. Acute blood loss anemia secondary to #1 reason. Will get serial Hg. No dark stool since admission. S/P 2 units PRBC's #3. Atrial fibrillation status post ablation on Coumadin - since patient has had a stroke previously at this time we will hold Coumadin and only give FFP for now. Holding off vitamin K for now. Continue her rate limiting medications. INR  2.37.  #4. History of CVA with history of atrial fibrillation on Coumadin - see #1 and #3.  #5. Hypertension - continue to hold Lasix for now we'll continue patient's rate limiting medications for atrial fibrillation.  #6. COPD -mild wheeze and mild increased work of breathing.  Check chest x-ray minimal peribronchial thickening. No segmental consolidation.  Continue nebulizer. Patient did have a choking spell 2 days ago after which patient is getting more short of breath and was placed on Xopenex HFA. Will continue #7. History of OSA on CPAP - CPAP per respiratory.   Code Status: full Family Communication: pt at bedside Disposition Plan: home with husband when ready hopefully 24-48hrs   Consultants:  GI  Procedures:  none  Antibiotics:  none  HPI/Subjective: Sitting up in bed. Awake alert oriented x3. Reports "it hurst to breathe".   Objective: Filed Vitals:   01/24/12 0443 01/24/12 0543 01/24/12 0640 01/24/12 0715  BP: 142/58 151/62 149/58 146/62  Pulse: 74 79 78 74  Temp: 98.6 F (37 C) 98.4 F (36.9 C) 98.4 F (36.9 C) 98.6 F (37 C)  TempSrc:        Resp:  18    Height:      Weight:      SpO2:  98%      Intake/Output Summary (Last 24 hours) at 01/24/12 0840 Last data filed at 01/24/12 0715  Gross per 24 hour  Intake 762.92 ml  Output      0 ml  Net 762.92 ml   Filed Weights   01/23/12 1714  Weight: 87.544 kg (193 lb)    Exam:   General:  Awake alert NAD  Cardiovascular: RRR No MGR No LEE  Respiratory: normal effort. BSCTAB faint wheeze. No crackles/rhonchi  Abdomen: obese soft +BS non-tender  Data Reviewed: Basic Metabolic Panel:  Lab [id number] 1737  NA 139  K 3.5  CL 102  CO2 27  GLUCOSE 140*  BUN 34*  CREATININE 0.90  CALCIUM 8.5  MG --  PHOS --   Liver Function Tests:  Lab 01/23/12 1737  AST 14  ALT 11  ALKPHOS 82  BILITOT 0.2*  PROT 6.1  ALBUMIN 3.6   No results found for this basename: LIPASE:5,AMYLASE:5 in the last 168 hours No results found for this basename: AMMONIA:5 in the last 168 hours CBC:  Lab 01/23/12 2231 01/23/12 1737  WBC 7.4 8.3  NEUTROABS -- 4.5  HGB 7.1* 7.6*  HCT 21.6* 23.2*  MCV 92.3 95.1  PLT 176 207   Cardiac Enzymes: No results found for this basename: CKTOTAL:5,CKMB:5,CKMBINDEX:5,TROPONINI:5 in the last 168 hours BNP (last 3 results) No results found for this basename: PROBNP:3  in the last 8760 hours CBG: No results found for this basename: GLUCAP:5 in the last 168 hours  No results found for this or any previous visit (from the past 240 hour(s)).   Studies: Dg Chest Port 1 View  01/24/2012  *RADIOLOGY REPORT*  Clinical Data: Wheezing.  PORTABLE CHEST - 1 VIEW  Comparison: 01/21/2012.  Findings: Mild cardiomegaly. Prominent mitral valve calcifications. Calcified tortuous aorta.  Central pulmonary vascular prominence.  Limited for evaluation of mediastinal abnormality on this portable exam. The patient would eventually benefit from follow-up two-view chest with cardiac leads removed.  Minimal peribronchial thickening.  No infiltrate, congestive heart failure  or pneumothorax.  IMPRESSION: Minimal peribronchial thickening.  No segmental consolidation.  Please see above.   Original Report Authenticated By: Genia Del, M.D.     Scheduled Meds:   . atorvastatin  20 mg Oral q1800  . diltiazem  120 mg Oral Q12H  . ezetimibe  10 mg Oral Daily  . [COMPLETED] levalbuterol      . levalbuterol  0.63 mg Nebulization Q6H  . metoprolol  50 mg Oral BID  . [COMPLETED]  morphine injection  1 mg Intravenous Once  . [COMPLETED] pantoprazole (PROTONIX) IV  80 mg Intravenous Once  . prednisoLONE acetate  1 drop Both Eyes Daily  . sodium chloride  3 mL Intravenous Q12H  . [DISCONTINUED] albuterol  2.5 mg Nebulization Q6H  . [DISCONTINUED] Bromfenac Sodium  1 drop Both Eyes Daily  . [DISCONTINUED] diltiazem  120 mg Oral Daily  . [DISCONTINUED] levalbuterol  0.63 mg Nebulization Once   Continuous Infusions:   . sodium chloride 10 mL (01/23/12 2238)  . pantoprozole (PROTONIX) infusion 8 mg/hr (01/23/12 2350)    Principal Problem:  *GI bleed Active Problems:  Essential hypertension, benign  Atrial fibrillation  Anemia  History of CVA (cerebrovascular accident)    Time spent: 30 minutes    [facility]  If 8PM-8AM, please contact night-coverage at [email or web address], password [facility] 01/24/2012, 8:40 AM  LOS: 1 day

## 2012-01-24 NOTE — Op Note (Signed)
[facility] [place], [address]   ENDOSCOPY PROCEDURE REPORT  PATIENT: Lynn Dunn, Lynn Dunn  MR#: [id number] BIRTHDATE: 1930/12/02 , 14  yrs. old GENDER: Female  ENDOSCOPIST: Clarene Essex, MD REFERRED [id number] Maceo Pro, M.D.  PROCEDURE DATE:  01/24/2012 PROCEDURE:   EGD w/ biopsy ASA CLASS:   Class II INDICATIONS:Melena.  MEDICATIONS: Fentanyl 50 mcg IV and Versed 4 mg IV  TOPICAL ANESTHETIC:used  DESCRIPTION OF PROCEDURE:   After the risks benefits and alternatives of the procedure were thoroughly explained, informed consent was obtained.  The Pentax Gastroscope [id number]  endoscope was introduced through the mouth and advanced to the second portion of the duodenum , limited by Without limitations. there was no signs of bleeding on insertion and other than a moderate sized antral ulcer with the flat white base and some antral erosions there was no other obvious endoscopic findings and 2 biopsies of the antrum into the proximal stomach were obtained to rule out Helicobacter and the ulcer was washed and watched andcould not be made to bleed The instrument was slowly withdrawn as the mucosa was fully examined.air was suctioned scope slowly withdrawnpatient tolerated the procedure well there was no obvious immediate complication         FINDINGS:1. Moderate sized antral ulcer with flat white base 48few antral 3. Otherwise within normal limits EGD status post stomach biopsy to rule out Helicobacter  COMPLICATIONS:none  ENDOSCOPIC IMPRESSION:above   RECOMMENDATIONS:no aspirin or nonsteroidals 3 months of pump inhibitor treat H. pylori if positive slowly advance diet reevaluate Coumadin needs but okay to start in a few days if no further bleeding  REPEAT EXAM: as needed   _______________________________ Clarene Essex, MD eSigned:  Clarene Essex, MD 01/24/2012 10:14 AM    [id number] Maceo Pro, MD  PATIENT NAME:  [name], [name] MR#:  [id number]

## 2012-01-25 ENCOUNTER — Encounter (HOSPITAL_COMMUNITY): Payer: Self-pay | Admitting: Gastroenterology

## 2012-01-25 LAB — BASIC METABOLIC PANEL
BUN: 13 mg/dL (ref 6–23)
Calcium: 8.4 mg/dL (ref 8.4–10.5)
Chloride: 107 mEq/L (ref 96–112)
Creatinine, Ser: 0.81 mg/dL (ref 0.50–1.10)
GFR calc Af Amer: 79 mL/min — ABNORMAL LOW (ref >90)

## 2012-01-25 LAB — PREPARE FRESH FROZEN PLASMA

## 2012-01-25 LAB — TYPE AND SCREEN
ABO/RH(D): A POS
Unit division: 0

## 2012-01-25 LAB — CBC
HCT: 26.4 % — ABNORMAL LOW (ref 36.0–46.0)
MCH: 30.8 pg (ref 26.0–34.0)
MCHC: 33.3 g/dL (ref 30.0–36.0)
MCV: 92.3 fL (ref 78.0–100.0)
Platelets: 172 10*3/uL (ref 150–400)
RDW: 16.4 % — ABNORMAL HIGH (ref 11.5–15.5)

## 2012-01-25 LAB — GLUCOSE, CAPILLARY: Glucose-Capillary: 110 mg/dL — ABNORMAL HIGH (ref 70–99)

## 2012-01-25 LAB — PROTIME-INR: Prothrombin Time: 20.9 seconds — ABNORMAL HIGH (ref 11.6–15.2)

## 2012-01-25 MED ORDER — PANTOPRAZOLE SODIUM 40 MG PO TBEC: 40.0000 mg | DELAYED_RELEASE_TABLET | Freq: Two times a day (BID) | ORAL | Status: DC

## 2012-01-25 MED ORDER — DILTIAZEM HCL ER 120 MG PO CP12: 120.0000 mg | ORAL_CAPSULE | Freq: Two times a day (BID) | ORAL | Status: DC

## 2012-01-25 MED FILL — Morphine Sulfate Inj PF 0.5 MG/ML: INTRAMUSCULAR | Qty: 2 | Status: AC

## 2012-01-25 NOTE — Progress Notes (Signed)
Utilization Review Completed.   Dhani Imel, RN, BSN Nurse Case Manager  [phone]  

## 2012-01-25 NOTE — Discharge Summary (Signed)
Patient seen and examined. Agree with note by Dyanne Carrel, NP. Patient was admitted for a GI Bleed and on EGD was found to have an antral ulcer. Needs to stop NSAIDS, ASA. Per GI ok to restart coumadin in 5 days. She has an appointment already scheduled with her PCP for 11/25 at which time a CBC should be checked to follow her Hb (8.8 on DC) and the decision to restart coumadin should be made at that point as well. Stable for DC home today. BID protonix for at least 30 days and then daily for at least 3 months. To follow up with Dr. Watt Climes in about 2 weeks for biopsy results in case she needs to be treated for H. Pylori.  Domingo Mend, MD [facility] Pager: [phone]

## 2012-01-25 NOTE — Discharge Summary (Signed)
Physician Discharge Summary  Lynn Dunn [id number] DOB: 02/10/1931 DOA: 01/23/2012  PCP: Abigail Miyamoto, MD  Admit date: 01/23/2012 Discharge date: 01/25/2012  Time spent: 40 minutes  Recommendations for Outpatient Follow-up:  1. Follow up with PCP on 11/25 (pt has appointment) will need INR check for resumption of coumadin. Will need CBC for monitoring of hg.   Discharge Diagnoses:  Principal Problem:  *GI bleed Active Problems:  Essential hypertension, benign  Atrial fibrillation  SLEEP APNEA  Anemia  History of CVA (cerebrovascular accident)   Discharge Condition: medically stable and ready for discharge to home  Diet recommendation: heart healthy  Filed Weights   01/23/12 1714  Weight: 87.544 kg (193 lb)    History of present illness:  78 year-old female with history of paroxysmal atrial fibrillation status post ablation on Coumadin, history of CVA, OSA, hypertension, COPD presented to the ER 11/19 at [facility] because of having 2 bowel movements which were black. Patient at that time also felt weak and dizzy but did not pass out. Patient had never had black stools previously. Patient had mild epigastric discomfort. In the ER patient's hemoglobin is found to be around 7.6 with a therapeutic INR but patient was hemodynamically stable. Patient was transferred to [facility] for further management. Patient stated that over the previous one month  She had been taking NSAIDs for generalized body ache. Denied any nausea vomiting chest pain or shortness of breath. Patient stated she had a coloscopy 2-3 years ago which was normal.  Of note, 2 days prior patient had a choking spell while eating.  Since that time she became mildly short of breath and had gone to her PCP who had prescribed her Xopenex inhaler. She was admitted to medicine service for further evaluation.    Hospital Course:  #1. GI bleed most likely upper GI - due to NSAID use.  Admitted to tele.   patient remained n.p.o. And provided with protonix infusion. She was given 2 units of PRBC. And one unit FFP to reverse her INR. She was seen by Dr Watt Climes with GI and underwent EGD on 01/24/12. Findings included moderate sized antral ulcer. Stomach biopsy to rule out Helicobacter. GI recommends no ASA, no NSAIDS continue PPI 3 months. Follow up biopsy and treat H pylori if positive.  #2. Acute blood loss anemia secondary to #1..  S/p 2 units PRBC. Hg 7.6 on admission. On discharge hg 8.8 down from 9.5 post transfusion. No further dark stools. Follow up CBC on 01/29/12 with PCP  #3. Atrial fibrillation status post ablation on Coumadin - Coumadin held and only given FFP for reversal. Continued BB and cardizem. Rate remained controlled during this hospitalization. At discharge INR  1.88. At discharge pt instructed to stop coumadin until 01/29/12 when she has apt with PCP and will at that time have INR checked and coumadin dosed for resumption.  #4. History of CVA with history of atrial fibrillation on Coumadin - see #1 and #3.  #5. Hypertension - Lasix held initially. At discharge BP 152/72. Lasix resumed at discharge. #6. COPD -mild wheeze and mild increased work of breathing. Chest x-ray minimal peribronchial thickening. No segmental consolidation. Provided with  nebulizer. Patient did have a choking spell 2 days ago after which patient is getting more short of breath and was placed on Xopenex HFA. Will continue at discharge #7. History of OSA on CPAP - CPAP provided.      Procedures:  EGD  Consultations:  GI Dr  Magod  Discharge Exam: Filed Vitals:   01/24/12 1412 01/24/12 1430 01/24/12 2235 01/25/12 0500  BP:  145/69 152/72 145/71  Pulse:  96 105 70  Temp:  98.2 F (36.8 C) 98.1 F (36.7 C) 97.8 F (36.6 C)  TempSrc:      Resp:  18 20 18   Height:      Weight:      SpO2: 98% 93% 91% 94%    General: awake alert oriented x3 Cardiovascular: RRR No MGR trace LEE Respiratory: normal  effort . BS somewhat coarse. Faint wheeze  Discharge Instructions  Discharge Orders    Future Orders Please Complete By Expires   Diet - low sodium heart healthy      Increase activity slowly      Discharge instructions      Comments:   Stop coumadin until seen by PCP on 01/29/12   Call MD for:  persistant nausea and vomiting      Call MD for:  persistant dizziness or light-headedness      Call MD for:  extreme fatigue          Medication List     As of 01/25/2012  8:54 AM    STOP taking these medications         warfarin 4 MG tablet   Commonly known as: COUMADIN      TAKE these medications         COQ10 PO   Take 1 capsule by mouth daily.      diltiazem 120 MG 12 hr capsule   Commonly known as: CARDIZEM SR   Take 1 capsule (120 mg total) by mouth every 12 (twelve) hours.      furosemide 40 MG tablet   Commonly known as: LASIX   Take 40 mg by mouth daily.      GLUCOSAMINE-CHONDROITIN PO   Take 1 tablet by mouth daily.      levalbuterol 45 MCG/ACT inhaler   Commonly known as: XOPENEX HFA   Inhale 1-2 puffs into the lungs every 4 (four) hours as needed for wheezing or shortness of breath (or cough; patient intolerant to plain albuterol).      metoprolol 50 MG tablet   Commonly known as: LOPRESSOR   Take 50 mg by mouth 2 (two) times daily.      pantoprazole 40 MG tablet   Commonly known as: PROTONIX   Take 1 tablet (40 mg total) by mouth 2 (two) times daily before a meal.      potassium chloride 10 MEQ tablet   Commonly known as: K-DUR   Take 10 mEq by mouth 2 (two) times daily.      rosuvastatin 20 MG tablet   Commonly known as: CRESTOR   Take 20 mg by mouth daily.      vitamin B-12 1000 MCG tablet   Commonly known as: CYANOCOBALAMIN   Take 1,000 mcg by mouth daily.      VITAMIN D PO   Take 1 capsule by mouth daily.      ZETIA 10 MG tablet   Generic drug: ezetimibe   Take 10 mg by mouth daily.           Follow-up Information    Follow up  with Abigail Miyamoto, MD. On 01/29/2012. (Will need cbc and INR check.)    Contact information:   [place] [address] [phone]           The results of significant diagnostics from  this hospitalization (including imaging, microbiology, ancillary and laboratory) are listed below for reference.    Significant Diagnostic Studies: Dg Chest 2 View  01/21/2012  *RADIOLOGY REPORT*  Clinical Data:  Shortness of breath, cough, patient states she aspirated yogurt  CHEST - 2 VIEW  Comparison: 12/24/2009  Findings: Enlargement of cardiac silhouette. Slight, right paratracheal soft tissues unchanged since 2010. Tortuous aorta with atherosclerotic calcification at arch. Pulmonary vascularity normal. Minimal right basilar atelectasis, chronic. No definite infiltrate, pleural effusion or pneumothorax. Bones appear diffusely demineralized.  IMPRESSION: Enlargement of cardiac silhouette. Minimal chronic right basilar atelectasis.   Original Report Authenticated By: Lavonia Dana, M.D.    Dg Chest Port 1 View  01/24/2012  *RADIOLOGY REPORT*  Clinical Data: Wheezing.  PORTABLE CHEST - 1 VIEW  Comparison: 01/21/2012.  Findings: Mild cardiomegaly. Prominent mitral valve calcifications. Calcified tortuous aorta.  Central pulmonary vascular prominence.  Limited for evaluation of mediastinal abnormality on this portable exam. The patient would eventually benefit from follow-up two-view chest with cardiac leads removed.  Minimal peribronchial thickening.  No infiltrate, congestive heart failure or pneumothorax.  IMPRESSION: Minimal peribronchial thickening.  No segmental consolidation.  Please see above.   Original Report Authenticated By: Genia Del, M.D.     Microbiology: No results found for this or any previous visit (from the past 240 hour(s)).   Labs: Basic Metabolic Panel:  Lab [id number] 01/24/12 1213 01/23/12 1737  NA 141 140 139  K 4.2 3.8 3.5  CL 107 104 102  CO2 29 23 27   GLUCOSE 102* 130*  140*  BUN 13 21 34*  CREATININE 0.81 0.79 0.90  CALCIUM 8.4 8.5 8.5  MG -- -- --  PHOS -- -- --   Liver Function Tests:  Lab 01/24/12 1213 01/23/12 1737  AST 19 14  ALT 10 11  ALKPHOS 81 82  BILITOT 0.4 0.2*  PROT 6.0 6.1  ALBUMIN 3.4* 3.6   No results found for this basename: LIPASE:5,AMYLASE:5 in the last 168 hours No results found for this basename: AMMONIA:5 in the last 168 hours CBC:  Lab 01/25/12 0554 01/24/12 2158 01/24/12 1213 01/23/12 2231 01/23/12 1737  WBC 5.6 6.8 10.0 7.4 8.3  NEUTROABS -- -- -- -- 4.5  HGB 8.8* 9.4* 9.5* 7.1* 7.6*  HCT 26.4* 28.5* 28.8* 21.6* 23.2*  MCV 92.3 91.3 91.4 92.3 95.1  PLT 172 173 163 176 207   Cardiac Enzymes:  Lab 01/24/12 1954 01/24/12 1520 01/24/12 1227  CKTOTAL -- -- --  CKMB -- -- --  CKMBINDEX -- -- --  TROPONINI <0.30 <0.30 <0.30   BNP: BNP (last 3 results) No results found for this basename: PROBNP:3 in the last 8760 hours CBG:  Lab 01/25/12 0730 01/24/12 2101 01/24/12 1612 01/24/12 1135 01/24/12 0734  GLUCAP 110* 162* 98 99 75       Signed:  BLACK,KAREN M NP [facility] 01/25/2012, 8:54 AM

## 2012-01-25 NOTE — Progress Notes (Signed)
,  Lynn Dunn 10:00 AM  Subjective: Patient is asymptomatic and ready to go home and has no problem from her endoscopy although her stools are still a little dark  Objective: Vital signs stable afebrile no acute distress hemoglobin slight drop probably hydration not examined today looks well  Assessment: Ulcer secondary to aspirin and nonsteroidal  Plan: Okay with me to be discharged OK to advance diet continue pump inhibitors hold aspirin and nonsteroidals and followup with primary care doctor next week to discuss arthritis medicine option and have asked her to call me next week for biopsy results to see if she has H. pylori or not and happy to see back when necessary  [facility]

## 2012-01-25 NOTE — Progress Notes (Signed)
Pt educated and informed of D/C information. Pt had questions r/t cardizem, NP was notified to further explain. Pt understands new medications and medication changes. Pt has no further questions or concerns. Pt prepared to be D/C'd with husband.

## 2012-01-29 DIAGNOSIS — D509 Iron deficiency anemia, unspecified: Secondary | ICD-10-CM | POA: Diagnosis not present

## 2012-01-29 DIAGNOSIS — I4891 Unspecified atrial fibrillation: Secondary | ICD-10-CM | POA: Diagnosis not present

## 2012-01-29 DIAGNOSIS — D62 Acute posthemorrhagic anemia: Secondary | ICD-10-CM | POA: Diagnosis not present

## 2012-01-29 DIAGNOSIS — M25569 Pain in unspecified knee: Secondary | ICD-10-CM | POA: Diagnosis not present

## 2012-01-29 DIAGNOSIS — K259 Gastric ulcer, unspecified as acute or chronic, without hemorrhage or perforation: Secondary | ICD-10-CM | POA: Diagnosis not present

## 2012-01-30 DIAGNOSIS — M542 Cervicalgia: Secondary | ICD-10-CM | POA: Diagnosis not present

## 2012-01-30 DIAGNOSIS — M9981 Other biomechanical lesions of cervical region: Secondary | ICD-10-CM | POA: Diagnosis not present

## 2012-01-30 DIAGNOSIS — M5030 Other cervical disc degeneration, unspecified cervical region: Secondary | ICD-10-CM | POA: Diagnosis not present

## 2012-01-30 DIAGNOSIS — R51 Headache: Secondary | ICD-10-CM | POA: Diagnosis not present

## 2012-02-08 DIAGNOSIS — I4891 Unspecified atrial fibrillation: Secondary | ICD-10-CM | POA: Diagnosis not present

## 2012-02-13 DIAGNOSIS — R51 Headache: Secondary | ICD-10-CM | POA: Diagnosis not present

## 2012-02-13 DIAGNOSIS — M542 Cervicalgia: Secondary | ICD-10-CM | POA: Diagnosis not present

## 2012-02-13 DIAGNOSIS — M5030 Other cervical disc degeneration, unspecified cervical region: Secondary | ICD-10-CM | POA: Diagnosis not present

## 2012-02-13 DIAGNOSIS — M9981 Other biomechanical lesions of cervical region: Secondary | ICD-10-CM | POA: Diagnosis not present

## 2012-02-15 DIAGNOSIS — B9789 Other viral agents as the cause of diseases classified elsewhere: Secondary | ICD-10-CM | POA: Diagnosis not present

## 2012-02-15 DIAGNOSIS — J449 Chronic obstructive pulmonary disease, unspecified: Secondary | ICD-10-CM | POA: Diagnosis not present

## 2012-02-19 DIAGNOSIS — D62 Acute posthemorrhagic anemia: Secondary | ICD-10-CM | POA: Diagnosis not present

## 2012-02-19 DIAGNOSIS — I4891 Unspecified atrial fibrillation: Secondary | ICD-10-CM | POA: Diagnosis not present

## 2012-02-19 DIAGNOSIS — Z7901 Long term (current) use of anticoagulants: Secondary | ICD-10-CM | POA: Diagnosis not present

## 2012-02-21 DIAGNOSIS — H02839 Dermatochalasis of unspecified eye, unspecified eyelid: Secondary | ICD-10-CM | POA: Diagnosis not present

## 2012-03-13 DIAGNOSIS — J029 Acute pharyngitis, unspecified: Secondary | ICD-10-CM | POA: Diagnosis not present

## 2012-03-18 DIAGNOSIS — E782 Mixed hyperlipidemia: Secondary | ICD-10-CM | POA: Diagnosis not present

## 2012-03-18 DIAGNOSIS — I4891 Unspecified atrial fibrillation: Secondary | ICD-10-CM | POA: Diagnosis not present

## 2012-03-18 DIAGNOSIS — Z79899 Other long term (current) drug therapy: Secondary | ICD-10-CM | POA: Diagnosis not present

## 2012-03-25 DIAGNOSIS — I4891 Unspecified atrial fibrillation: Secondary | ICD-10-CM | POA: Diagnosis not present

## 2012-03-27 DIAGNOSIS — I4891 Unspecified atrial fibrillation: Secondary | ICD-10-CM | POA: Diagnosis not present

## 2012-03-27 DIAGNOSIS — Z7901 Long term (current) use of anticoagulants: Secondary | ICD-10-CM | POA: Diagnosis not present

## 2012-03-27 DIAGNOSIS — K259 Gastric ulcer, unspecified as acute or chronic, without hemorrhage or perforation: Secondary | ICD-10-CM | POA: Diagnosis not present

## 2012-03-27 DIAGNOSIS — R109 Unspecified abdominal pain: Secondary | ICD-10-CM | POA: Diagnosis not present

## 2012-03-27 DIAGNOSIS — D500 Iron deficiency anemia secondary to blood loss (chronic): Secondary | ICD-10-CM | POA: Diagnosis not present

## 2012-04-02 DIAGNOSIS — Z1231 Encounter for screening mammogram for malignant neoplasm of breast: Secondary | ICD-10-CM | POA: Diagnosis not present

## 2012-04-05 DIAGNOSIS — M542 Cervicalgia: Secondary | ICD-10-CM | POA: Diagnosis not present

## 2012-04-05 DIAGNOSIS — R51 Headache: Secondary | ICD-10-CM | POA: Diagnosis not present

## 2012-04-05 DIAGNOSIS — M5030 Other cervical disc degeneration, unspecified cervical region: Secondary | ICD-10-CM | POA: Diagnosis not present

## 2012-04-05 DIAGNOSIS — M9981 Other biomechanical lesions of cervical region: Secondary | ICD-10-CM | POA: Diagnosis not present

## 2012-04-08 DIAGNOSIS — Z7901 Long term (current) use of anticoagulants: Secondary | ICD-10-CM | POA: Diagnosis not present

## 2012-04-09 DIAGNOSIS — D509 Iron deficiency anemia, unspecified: Secondary | ICD-10-CM | POA: Diagnosis not present

## 2012-04-09 DIAGNOSIS — K259 Gastric ulcer, unspecified as acute or chronic, without hemorrhage or perforation: Secondary | ICD-10-CM | POA: Diagnosis not present

## 2012-04-16 DIAGNOSIS — R109 Unspecified abdominal pain: Secondary | ICD-10-CM | POA: Diagnosis not present

## 2012-04-22 DIAGNOSIS — Z7901 Long term (current) use of anticoagulants: Secondary | ICD-10-CM | POA: Diagnosis not present

## 2012-04-29 DIAGNOSIS — Z7901 Long term (current) use of anticoagulants: Secondary | ICD-10-CM | POA: Diagnosis not present

## 2012-05-03 DIAGNOSIS — R51 Headache: Secondary | ICD-10-CM | POA: Diagnosis not present

## 2012-05-03 DIAGNOSIS — M9981 Other biomechanical lesions of cervical region: Secondary | ICD-10-CM | POA: Diagnosis not present

## 2012-05-03 DIAGNOSIS — M542 Cervicalgia: Secondary | ICD-10-CM | POA: Diagnosis not present

## 2012-05-03 DIAGNOSIS — M5030 Other cervical disc degeneration, unspecified cervical region: Secondary | ICD-10-CM | POA: Diagnosis not present

## 2012-05-12 ENCOUNTER — Other Ambulatory Visit: Payer: Self-pay

## 2012-05-12 DIAGNOSIS — Z8673 Personal history of transient ischemic attack (TIA), and cerebral infarction without residual deficits: Secondary | ICD-10-CM | POA: Insufficient documentation

## 2012-05-12 DIAGNOSIS — J449 Chronic obstructive pulmonary disease, unspecified: Secondary | ICD-10-CM | POA: Diagnosis not present

## 2012-05-12 DIAGNOSIS — K219 Gastro-esophageal reflux disease without esophagitis: Secondary | ICD-10-CM | POA: Insufficient documentation

## 2012-05-12 DIAGNOSIS — G4733 Obstructive sleep apnea (adult) (pediatric): Secondary | ICD-10-CM | POA: Diagnosis not present

## 2012-05-12 DIAGNOSIS — E669 Obesity, unspecified: Secondary | ICD-10-CM | POA: Insufficient documentation

## 2012-05-12 DIAGNOSIS — I499 Cardiac arrhythmia, unspecified: Secondary | ICD-10-CM | POA: Diagnosis not present

## 2012-05-12 DIAGNOSIS — J4489 Other specified chronic obstructive pulmonary disease: Secondary | ICD-10-CM | POA: Insufficient documentation

## 2012-05-12 DIAGNOSIS — Z79899 Other long term (current) drug therapy: Secondary | ICD-10-CM | POA: Diagnosis not present

## 2012-05-12 DIAGNOSIS — R55 Syncope and collapse: Secondary | ICD-10-CM | POA: Diagnosis not present

## 2012-05-12 DIAGNOSIS — I4891 Unspecified atrial fibrillation: Principal | ICD-10-CM | POA: Insufficient documentation

## 2012-05-12 DIAGNOSIS — R0789 Other chest pain: Secondary | ICD-10-CM | POA: Diagnosis not present

## 2012-05-12 DIAGNOSIS — I10 Essential (primary) hypertension: Secondary | ICD-10-CM | POA: Insufficient documentation

## 2012-05-12 DIAGNOSIS — E860 Dehydration: Secondary | ICD-10-CM | POA: Diagnosis not present

## 2012-05-13 ENCOUNTER — Observation Stay (HOSPITAL_BASED_OUTPATIENT_CLINIC_OR_DEPARTMENT_OTHER)
Admission: EM | Admit: 2012-05-13 | Discharge: 2012-05-14 | Disposition: A | Discharge status: Home / Self Care | Payer: Medicare Other | Attending: Family Medicine | Admitting: Family Medicine

## 2012-05-13 ENCOUNTER — Emergency Department (HOSPITAL_BASED_OUTPATIENT_CLINIC_OR_DEPARTMENT_OTHER): Payer: Medicare Other

## 2012-05-13 ENCOUNTER — Encounter (HOSPITAL_BASED_OUTPATIENT_CLINIC_OR_DEPARTMENT_OTHER): Payer: Self-pay | Admitting: *Deleted

## 2012-05-13 DIAGNOSIS — R0602 Shortness of breath: Secondary | ICD-10-CM | POA: Diagnosis not present

## 2012-05-13 DIAGNOSIS — E860 Dehydration: Secondary | ICD-10-CM

## 2012-05-13 DIAGNOSIS — I499 Cardiac arrhythmia, unspecified: Secondary | ICD-10-CM | POA: Diagnosis not present

## 2012-05-13 DIAGNOSIS — G4730 Sleep apnea, unspecified: Secondary | ICD-10-CM

## 2012-05-13 DIAGNOSIS — R55 Syncope and collapse: Secondary | ICD-10-CM

## 2012-05-13 DIAGNOSIS — I4891 Unspecified atrial fibrillation: Secondary | ICD-10-CM | POA: Diagnosis not present

## 2012-05-13 DIAGNOSIS — I10 Essential (primary) hypertension: Secondary | ICD-10-CM | POA: Diagnosis not present

## 2012-05-13 DIAGNOSIS — D649 Anemia, unspecified: Secondary | ICD-10-CM

## 2012-05-13 DIAGNOSIS — Z8673 Personal history of transient ischemic attack (TIA), and cerebral infarction without residual deficits: Secondary | ICD-10-CM

## 2012-05-13 DIAGNOSIS — K922 Gastrointestinal hemorrhage, unspecified: Secondary | ICD-10-CM

## 2012-05-13 HISTORY — DX: Angina pectoris, unspecified: I20.9

## 2012-05-13 HISTORY — DX: Anemia, unspecified: D64.9

## 2012-05-13 LAB — URINALYSIS, ROUTINE W REFLEX MICROSCOPIC
Nitrite: NEGATIVE
Specific Gravity, Urine: 1.009 (ref 1.005–1.030)
Urobilinogen, UA: 0.2 mg/dL (ref 0.0–1.0)

## 2012-05-13 LAB — URINE MICROSCOPIC-ADD ON

## 2012-05-13 LAB — COMPREHENSIVE METABOLIC PANEL
Albumin: 4.2 g/dL (ref 3.5–5.2)
BUN: 24 mg/dL — ABNORMAL HIGH (ref 6–23)
Creatinine, Ser: 1 mg/dL (ref 0.50–1.10)
Total Protein: 7.2 g/dL (ref 6.0–8.3)

## 2012-05-13 LAB — PROTIME-INR
INR: 3.3 — ABNORMAL HIGH (ref 0.00–1.49)
Prothrombin Time: 31.7 seconds — ABNORMAL HIGH (ref 11.6–15.2)

## 2012-05-13 LAB — GLUCOSE, CAPILLARY: Glucose-Capillary: 110 mg/dL — ABNORMAL HIGH (ref 70–99)

## 2012-05-13 LAB — TROPONIN I: Troponin I: <0.3 ng/mL (ref <0.30)

## 2012-05-13 MED ORDER — ONDANSETRON HCL 4 MG/2ML IJ SOLN: 4.0000 mg | Freq: Three times a day (TID) | INTRAMUSCULAR | Status: AC | PRN

## 2012-05-13 MED ORDER — POTASSIUM CHLORIDE ER 10 MEQ PO TBCR
10.0000 meq | EXTENDED_RELEASE_TABLET | Freq: Two times a day (BID) | ORAL | Status: DC
  Administered 2012-05-13 – 2012-05-14 (×3): 10 meq via ORAL
  Filled 2012-05-13 (×4): qty 1

## 2012-05-13 MED ORDER — SODIUM CHLORIDE 0.9 % IV BOLUS (SEPSIS)
250.0000 mL | Freq: Once | INTRAVENOUS | Status: AC
  Administered 2012-05-13: 250 mL via INTRAVENOUS

## 2012-05-13 MED ORDER — DILTIAZEM HCL ER 60 MG PO CP12
120.0000 mg | ORAL_CAPSULE | Freq: Two times a day (BID) | ORAL | Status: DC
  Administered 2012-05-13 – 2012-05-14 (×2): 120 mg via ORAL
  Filled 2012-05-13 (×4): qty 2

## 2012-05-13 MED ORDER — DILTIAZEM LOAD VIA INFUSION
15.0000 mg | Freq: Once | INTRAVENOUS | Status: DC
  Filled 2012-05-13: qty 15

## 2012-05-13 MED ORDER — SODIUM CHLORIDE 0.9 % IV BOLUS (SEPSIS): 500.0000 mL | Freq: Once | INTRAVENOUS | Status: DC

## 2012-05-13 MED ORDER — DILTIAZEM HCL 30 MG PO TABS
ORAL_TABLET | ORAL | Status: AC
  Administered 2012-05-13: 30 mg
  Filled 2012-05-13: qty 1

## 2012-05-13 MED ORDER — ACETAMINOPHEN 325 MG PO TABS
ORAL_TABLET | ORAL | Status: AC
  Administered 2012-05-13: 650 mg
  Filled 2012-05-13: qty 2

## 2012-05-13 MED ORDER — FUROSEMIDE 40 MG PO TABS
40.0000 mg | ORAL_TABLET | Freq: Every day | ORAL | Status: DC
  Administered 2012-05-14: 40 mg via ORAL
  Filled 2012-05-13 (×2): qty 1

## 2012-05-13 MED ORDER — EZETIMIBE 10 MG PO TABS
10.0000 mg | ORAL_TABLET | Freq: Every day | ORAL | Status: DC
  Administered 2012-05-13 – 2012-05-14 (×2): 10 mg via ORAL
  Filled 2012-05-13 (×2): qty 1

## 2012-05-13 MED ORDER — LEVALBUTEROL TARTRATE 45 MCG/ACT IN AERO
1.0000 | INHALATION_SPRAY | RESPIRATORY_TRACT | Status: DC | PRN
  Filled 2012-05-13: qty 15

## 2012-05-13 MED ORDER — POTASSIUM CHLORIDE CRYS ER 20 MEQ PO TBCR
40.0000 meq | EXTENDED_RELEASE_TABLET | Freq: Once | ORAL | Status: AC
  Administered 2012-05-13: 40 meq via ORAL
  Filled 2012-05-13: qty 2

## 2012-05-13 MED ORDER — WARFARIN - PHARMACIST DOSING INPATIENT: Freq: Every day | Status: DC

## 2012-05-13 MED ORDER — METOPROLOL TARTRATE 50 MG PO TABS
50.0000 mg | ORAL_TABLET | Freq: Two times a day (BID) | ORAL | Status: DC
  Administered 2012-05-13 – 2012-05-14 (×2): 50 mg via ORAL
  Filled 2012-05-13 (×4): qty 1

## 2012-05-13 MED ORDER — ATORVASTATIN CALCIUM 40 MG PO TABS
40.0000 mg | ORAL_TABLET | Freq: Every day | ORAL | Status: DC
  Administered 2012-05-13: 40 mg via ORAL
  Filled 2012-05-13 (×2): qty 1

## 2012-05-13 MED ORDER — SODIUM CHLORIDE 0.9 % IJ SOLN
3.0000 mL | Freq: Two times a day (BID) | INTRAMUSCULAR | Status: DC
  Administered 2012-05-13 (×2): 3 mL via INTRAVENOUS

## 2012-05-13 MED ORDER — VITAMIN B-12 1000 MCG PO TABS
1000.0000 ug | ORAL_TABLET | Freq: Every day | ORAL | Status: DC
  Administered 2012-05-13 – 2012-05-14 (×2): 1000 ug via ORAL
  Filled 2012-05-13 (×2): qty 1

## 2012-05-13 MED ORDER — PANTOPRAZOLE SODIUM 40 MG PO TBEC
40.0000 mg | DELAYED_RELEASE_TABLET | Freq: Two times a day (BID) | ORAL | Status: DC
  Administered 2012-05-13 – 2012-05-14 (×3): 40 mg via ORAL
  Filled 2012-05-13 (×5): qty 1

## 2012-05-13 MED ORDER — TRAMADOL HCL 50 MG PO TABS
50.0000 mg | ORAL_TABLET | Freq: Four times a day (QID) | ORAL | Status: DC | PRN
  Administered 2012-05-13: 50 mg via ORAL
  Filled 2012-05-13: qty 1

## 2012-05-13 NOTE — H&P (Signed)
[facility] History and Physical  Lynn Dunn [id number] DOB: [birth year] DOA: 05/13/2012  Referring physician: ED PCP: Abigail Miyamoto, MD  Specialists: [place] [facility]  Chief Complaint: Palpitations  HPI: Lynn Dunn is a 79 y.o. female who presented to [facility] earlier this evening with c/o 3 to 5 hour history of sudden onset palpitations.  The patient has had this problem before when she has had A.fib with RVR in the past (last episode was March 2013.  She tried taking cardizem, clonadine, and benicar at home without relief to palpitations.  She has a history of an ablation done several years back.  The patient was treated with Cardizem at [facility] which brought her rate down to 90 and transferred to [facility] for admission for A.Fib with RVR.  Fortunately the patient states that as soon as they took her out of the ambulance after arrival at [facility] she felt her symptoms spontaneously resolve and indeed when she is hooked up to the monitor on the floor she is in NSR at 28.  Review of Systems: 12 systems reviewed and otherwise negative.  Past Medical History  Diagnosis Date  . HTN (hypertension)   . PAF (paroxysmal atrial fibrillation)     s/p ablation 07/29/09  . COPD (chronic obstructive pulmonary disease)   . GERD (gastroesophageal reflux disease)   . Obesity   . Stroke 6/09  . Anginal pain 2011    had to take nitro   . OSA (obstructive sleep apnea)     cpap at home  . Heart murmur   . Anemia    Past Surgical History  Procedure Laterality Date  . Atrial ablation surgery      s/p ablation for afib 07/29/09  . Abdominal hysterectomy    . Esophagogastroduodenoscopy  01/24/2012    Procedure: ESOPHAGOGASTRODUODENOSCOPY (EGD);  Surgeon: Jeryl Columbia, MD;  Location: [facility] ENDOSCOPY;  Service: Endoscopy;  Laterality: N/A;   Social History:  reports that she has never smoked. She does not have any smokeless tobacco history on file. She reports that she does not drink alcohol or use illicit  drugs.   Allergies  Allergen Reactions  . Albuterol     Atrial fibrillation    Family History  Problem Relation Age of Onset  . Breast cancer    . Brain cancer    . Breast cancer Mother   . Brain cancer Father      Prior to Admission medications   Medication Sig Start Date End Date Taking? Authorizing Provider  Cholecalciferol (VITAMIN D PO) Take 1 capsule by mouth daily.    Historical Provider, MD  Coenzyme Q10 (COQ10 PO) Take 1 capsule by mouth daily.    Historical Provider, MD  diltiazem (CARDIZEM SR) 120 MG 12 hr capsule Take 1 capsule (120 mg total) by mouth every 12 (twelve) hours. 01/25/12   Radene Gunning, NP  furosemide (LASIX) 40 MG tablet Take 40 mg by mouth daily.      Historical Provider, MD  GLUCOSAMINE-CHONDROITIN PO Take 1 tablet by mouth daily.    Historical Provider, MD  levalbuterol [facility] HFA) 45 MCG/ACT inhaler Inhale 1-2 puffs into the lungs every 4 (four) hours as needed for wheezing or shortness of breath (or cough; patient intolerant to plain albuterol). 01/22/12   John L Molpus, MD  metoprolol (LOPRESSOR) 50 MG tablet Take 50 mg by mouth 2 (two) times daily.  06/22/11   Historical Provider, MD  pantoprazole (PROTONIX) 40 MG tablet Take 1 tablet (40  mg total) by mouth 2 (two) times daily before a meal. 01/25/12   Radene Gunning, NP  potassium chloride (K-DUR) 10 MEQ tablet Take 10 mEq by mouth 2 (two) times daily.  06/02/11   Historical Provider, MD  rosuvastatin (CRESTOR) 20 MG tablet Take 20 mg by mouth daily.    Historical Provider, MD  vitamin B-12 (CYANOCOBALAMIN) 1000 MCG tablet Take 1,000 mcg by mouth daily.      Historical Provider, MD  ZETIA 10 MG tablet Take 10 mg by mouth daily.  07/21/11   Historical Provider, MD   Physical Exam: Filed Vitals:   05/13/12 0220 05/13/12 0233 05/13/12 0345 05/13/12 0459  BP: 97/62 105/77 97/55 115/53  Pulse:  107 78 65  Temp:    98.7 F (37.1 C)  TempSrc:    Oral  Resp: 17     Height:    5\' 2"  (1.575 m)   Weight:    90.084 kg (198 lb 9.6 oz)  SpO2:    100%    General:  NAD, resting comfortably in bed Eyes: PEERLA EOMI ENT: mucous membranes moist Neck: supple w/o JVD Cardiovascular: RRR w/o MRG Respiratory: CTA B Abdomen: soft, nt, nd, bs+ Skin: no rash nor lesion Musculoskeletal: MAE, full ROM all 4 extremities Psychiatric: normal tone and affect Neurologic: AAOx3, grossly non-focal  Labs on Admission:  Basic Metabolic Panel:  Recent Labs Lab 05/13/12 0019  NA 142  K 3.2*  CL 99  CO2 29  GLUCOSE 135*  BUN 24*  CREATININE 1.00  CALCIUM 9.4   Liver Function Tests:  Recent Labs Lab 05/13/12 0019  AST 19  ALT 14  ALKPHOS 101  BILITOT 0.3  PROT 7.2  ALBUMIN 4.2   No results found for this basename: LIPASE, AMYLASE,  in the last 168 hours No results found for this basename: AMMONIA,  in the last 168 hours CBC: No results found for this basename: WBC, NEUTROABS, HGB, HCT, MCV, PLT,  in the last 168 hours Cardiac Enzymes:  Recent Labs Lab 05/13/12 0019  TROPONINI <0.30    BNP (last 3 results) No results found for this basename: PROBNP,  in the last 8760 hours CBG: No results found for this basename: GLUCAP,  in the last 168 hours  Radiological Exams on Admission: Dg Chest Portable 1 View  05/13/2012  *RADIOLOGY REPORT*  Clinical Data: Chest pain, shortness of breath, irregular heart rate  PORTABLE CHEST - 1 VIEW  Comparison: 01/24/2012  Findings: Hypoaeration with interstitial and vascular crowding. Cardiomediastinal contours are prominent, similar to prior.  Aortic atherosclerosis.  No definite pleural effusion or pneumothorax. Limited osseous evaluation.  IMPRESSION: Prominent cardiomediastinal contours are similar to prior.  Hypoaeration with interstitial vascular crowding.  No definite consolidation.   Original Report Authenticated By: Carlos Levering, M.D.     EKG: Independently reviewed.  Assessment/Plan Active Problems:   Essential hypertension,  benign   Atrial fibrillation   1. A.Fib with RVR - RVR was controlled with cardizem, but before the patient even arrived at [facility] from [facility] transfer her A.Fib spontaneously converted to NSR rate of 65.  Unsurprisingly the patients palpitations have also resolved and she is asymptomatic at this point.  Have put her on obs status as she has already arrived at [facility] and it is early in the morning, but as I told the patient unless she re-develops A.Fib, it seems likely she will be good for discharge later today. 2. HTN - continue home meds.    Code  Status: Full Code (must indicate code status--if unknown or must be presumed, indicate so) Family Communication: No family in room (indicate person spoken with, if applicable, with phone number if by telephone) Disposition Plan: Admit to obs, likely home later today (indicate anticipated LOS)  Time spent: 30 min  GARDNER, JARED M. [facility] Pager [phone]  If 7PM-7AM, please contact night-coverage [email or web address] Password [facility] 05/13/2012, 5:48 AM

## 2012-05-13 NOTE — ED Notes (Signed)
Report called and update given to [place] room [address] . [facility] departing at this time with Lynn Dunn transporting to [facility]

## 2012-05-13 NOTE — ED Notes (Signed)
Pt states she has a hx of a-fib. Tried to go to sleep tonight, but felt heartrate was irregular and she couldn't. Took extra Cardizem, Clonidine and Benicar about 45 min ago, but did not slow. BP was up as well 175/135

## 2012-05-13 NOTE — Progress Notes (Signed)
NOTIFIED DR. VEGA ABOUT PATIENTS VITAL SIGNS, NO NEW ORDERS AT THIS TIME, WILL CONTINUE TO MONITOR AND OBSERVE PT.

## 2012-05-13 NOTE — ED Notes (Signed)
[facility] at bedside 

## 2012-05-13 NOTE — ED Notes (Signed)
Husband Lynn Dunn contacted at home and notified that wife Lynn Dunn will be going to [facility] Room 1430.

## 2012-05-13 NOTE — ED Notes (Signed)
Assigned to room 1430 @ [facility], RN notified, [facility] called for transport.

## 2012-05-13 NOTE — ED Provider Notes (Signed)
History  This chart was scribed for Varney Biles, MD by Roe Coombs, ED Scribe. The patient was seen in room MH04/MH04. Patient's care was started at [place].   CSN: [id number]  Arrival date & time 05/12/12  2356   First MD Initiated Contact with Patient 05/13/12 0010      Chief Complaint  Patient presents with  . Irregular Heart Beat     Patient is a 79 y.o. female presenting with palpitations. The history is provided by the patient. No language interpreter was used.  Palpitations  This is a new problem. The current episode started 3 to 5 hours ago. The problem occurs constantly. The problem has not changed since onset.The problem is associated with an unknown factor. On average, each episode lasts 3 hours. Pertinent negatives include no diaphoresis, no fever, no chest pain, no abdominal pain, no nausea, no vomiting and no dizziness. Treatments tried: Cardizem, Clonidine, Benicar. The treatment provided no relief.     HPI Comments: Lynn Dunn is a 79 y.o. female with history of paroxysmal atrial fibrillation who presents to the Emergency Department complaining of constant, irregular heart rhythm and palpitations onset 3 hours ago. Patient is also complaining of shortness of breath and chest tightness. She states that about 2 hours ago she had lightheadedness and faintness, but she denies loss of consciousness. She denies nausea, diaphoresis, dizziness, or new leg swelling. There is no vomiting, diarrhea, blood in stool or hematemesis. Patient took Cardizem, Clonidine and Benicar about 45 minutes ago, but symptoms did not improve.  Patient is currently on Coumadin. Other medical history includes hypertension, COPD, GERD, stroke and obstructive sleep apnea.  Past Medical History  Diagnosis Date  . HTN (hypertension)   . PAF (paroxysmal atrial fibrillation)     s/p ablation 07/29/09  . COPD (chronic obstructive pulmonary disease)   . GERD (gastroesophageal reflux disease)   . Obesity    . Stroke 6/09  . OSA (obstructive sleep apnea)     Past Surgical History  Procedure Laterality Date  . Atrial ablation surgery      s/p ablation for afib 07/29/09  . Abdominal hysterectomy    . Esophagogastroduodenoscopy  01/24/2012    Procedure: ESOPHAGOGASTRODUODENOSCOPY (EGD);  Surgeon: Jeryl Columbia, MD;  Location: [facility] ENDOSCOPY;  Service: Endoscopy;  Laterality: N/A;    Family History  Problem Relation Age of Onset  . Breast cancer    . Brain cancer      History  Substance Use Topics  . Smoking status: Never Smoker   . Smokeless tobacco: Not on file  . Alcohol Use: No    OB History   Grav Para Term Preterm Abortions TAB SAB Ect Mult Living                  Review of Systems  Constitutional: Negative for fever, chills and diaphoresis.  Respiratory: Positive for chest tightness.   Cardiovascular: Positive for palpitations. Negative for chest pain and leg swelling.  Gastrointestinal: Negative for nausea, vomiting, abdominal pain and diarrhea.  Neurological: Positive for light-headedness. Negative for dizziness.  All other systems reviewed and are negative.    Allergies  Albuterol  Home Medications   Current Outpatient Rx  Name  Route  Sig  Dispense  Refill  . Cholecalciferol (VITAMIN D PO)   Oral   Take 1 capsule by mouth daily.         . Coenzyme Q10 (COQ10 PO)   Oral   Take 1 capsule by mouth  daily.         . diltiazem (CARDIZEM SR) 120 MG 12 hr capsule   Oral   Take 1 capsule (120 mg total) by mouth every 12 (twelve) hours.   30 capsule   0   . furosemide (LASIX) 40 MG tablet   Oral   Take 40 mg by mouth daily.           Marland Kitchen GLUCOSAMINE-CHONDROITIN PO   Oral   Take 1 tablet by mouth daily.         Marland Kitchen levalbuterol (XOPENEX HFA) 45 MCG/ACT inhaler   Inhalation   Inhale 1-2 puffs into the lungs every 4 (four) hours as needed for wheezing or shortness of breath (or cough; patient intolerant to plain albuterol).   1 Inhaler   0   .  metoprolol (LOPRESSOR) 50 MG tablet   Oral   Take 50 mg by mouth 2 (two) times daily.          . pantoprazole (PROTONIX) 40 MG tablet   Oral   Take 1 tablet (40 mg total) by mouth 2 (two) times daily before a meal.   60 tablet   0   . potassium chloride (K-DUR) 10 MEQ tablet   Oral   Take 10 mEq by mouth 2 (two) times daily.          . rosuvastatin (CRESTOR) 20 MG tablet   Oral   Take 20 mg by mouth daily.         . vitamin B-12 (CYANOCOBALAMIN) 1000 MCG tablet   Oral   Take 1,000 mcg by mouth daily.           Marland Kitchen ZETIA 10 MG tablet   Oral   Take 10 mg by mouth daily.            Triage Vitals: BP 155/91  Pulse 118  Temp(Src) 98.2 F (36.8 C) (Oral)  Resp 20  Ht 5' 2.5" (1.588 m)  Wt 190 lb (86.183 kg)  BMI 34.18 kg/m2  SpO2 99%  Physical Exam  Constitutional: She is oriented to person, place, and time. She appears well-developed and well-nourished.  HENT:  Head: Normocephalic and atraumatic.  Eyes: Conjunctivae and EOM are normal. Pupils are equal, round, and reactive to light.  Neck: Normal range of motion. Neck supple. No JVD present.  Cardiovascular: An irregularly irregular rhythm present. Tachycardia present.   No murmur heard. Pulmonary/Chest: Effort normal and breath sounds normal. No respiratory distress. She has no wheezes. She has no rhonchi. She has no rales.  Abdominal: Soft. She exhibits no mass. There is no tenderness.  No palpable nodules.  Musculoskeletal: Normal range of motion. She exhibits edema.  1+ pitting edema. Bilateral lower extremity swelling.  Neurological: She is alert and oriented to person, place, and time.  Skin: Skin is warm and dry.  Psychiatric: She has a normal mood and affect. Her behavior is normal.    ED Course  Procedures (including critical care time) DIAGNOSTIC STUDIES: Oxygen Saturation is 99% on room air, normal by my interpretation.    COORDINATION OF CARE: 12:31 AM- Patient informed of current plan for  treatment and evaluation and agrees with plan at this time.      Labs Reviewed - No data to display No results found.   No diagnosis found.    MDM  I personally performed the services described in this documentation, which was scribed in my presence. The recorded information has been reviewed and is accurate.  Date: 05/13/2012  Rate: 136  Rhythm: atrial fibrillation  QRS Axis: left  Intervals: normal  ST/T Wave abnormalities: nonspecific ST/T changes  Conduction Disutrbances:none  Narrative Interpretation:   Old EKG Reviewed: changes noted - RVR  Pt comes in with cc of palpitations. Noted to be in afib with rvr/ Pt has been taking her meds, she is denying any sx consistent with infection. Pt has had episode of near syncope with the palpitations, and she has some chest discomfort. We will hydrate and reassess for iv vs po short acting dilt.  2:09 AM Pt's HR now around 100- 120.  We will give po short acting dilt. Pt ambulated - and got really short of breath, with HR in the 150s. We will call for admission. She needs med reconciliation and possible uptitration of her meds.     Varney Biles, MD 05/13/12 0210

## 2012-05-13 NOTE — Progress Notes (Signed)
ANTICOAGULATION CONSULT NOTE - Initial Consult  Pharmacy Consult for Warfarin Indication: atrial fibrillation  Allergies  Allergen Reactions  . Albuterol     Atrial fibrillation    Patient Measurements: Height: 5\' 2"  (157.5 cm) Weight: 198 lb 9.6 oz (90.084 kg) IBW/kg (Calculated) : 50.1 Heparin Dosing Weight:   Vital Signs: Temp: 98.7 F (37.1 C) (03/10 0459) Temp src: Oral (03/10 0459) BP: 115/53 mmHg (03/10 0459) Pulse Rate: 65 (03/10 0459)  Labs:  Recent Labs  05/13/12 0019  LABPROT 31.7*  INR 3.30*  CREATININE 1.00  TROPONINI <0.30    Estimated Creatinine Clearance: 47.6 ml/min (by C-G formula based on Cr of 1).   Medical History: Past Medical History  Diagnosis Date  . HTN (hypertension)   . PAF (paroxysmal atrial fibrillation)     s/p ablation 07/29/09  . COPD (chronic obstructive pulmonary disease)   . GERD (gastroesophageal reflux disease)   . Obesity   . Stroke 6/09  . Anginal pain 2011    had to take nitro   . OSA (obstructive sleep apnea)     cpap at home  . Heart murmur   . Anemia     Medications:  Prescriptions prior to admission  Medication Sig Dispense Refill  . Cholecalciferol (VITAMIN D PO) Take 1 capsule by mouth daily.      . Coenzyme Q10 (COQ10 PO) Take 1 capsule by mouth daily.      Marland Kitchen diltiazem (CARDIZEM SR) 120 MG 12 hr capsule Take 1 capsule (120 mg total) by mouth every 12 (twelve) hours.  30 capsule  0  . furosemide (LASIX) 40 MG tablet Take 40 mg by mouth daily.        Marland Kitchen GLUCOSAMINE-CHONDROITIN PO Take 1 tablet by mouth daily.      Marland Kitchen levalbuterol (XOPENEX HFA) 45 MCG/ACT inhaler Inhale 1-2 puffs into the lungs every 4 (four) hours as needed for wheezing or shortness of breath (or cough; patient intolerant to plain albuterol).  1 Inhaler  0  . metoprolol (LOPRESSOR) 50 MG tablet Take 50 mg by mouth 2 (two) times daily.       . pantoprazole (PROTONIX) 40 MG tablet Take 1 tablet (40 mg total) by mouth 2 (two) times daily before  a meal.  60 tablet  0  . potassium chloride (K-DUR) 10 MEQ tablet Take 10 mEq by mouth 2 (two) times daily.       . rosuvastatin (CRESTOR) 20 MG tablet Take 20 mg by mouth daily.      . vitamin B-12 (CYANOCOBALAMIN) 1000 MCG tablet Take 1,000 mcg by mouth daily.        Marland Kitchen ZETIA 10 MG tablet Take 10 mg by mouth daily.         Assessment: Patient with chronic warfarin for afib.  INR > 3 this am.    Goal of Therapy:  INR 2-3    Plan:  Follow up with patient home warfarin dose.  Daily INR  Nani Skillern Crowford 05/13/2012,5:54 AM

## 2012-05-13 NOTE — Progress Notes (Signed)
Patient seen and examined earlier this am by my associate.  Please refer to his H and P for details regarding assessment and plan.  Patient's heart rate is rate controlled.  Reportedly her pulse is at 60 and she has had soft blood pressure with the last one recorded at 97/44.  As such will fluid bolus her 250cc.  Will hold B blocker, cardizem, and lasix.  Discussed with nursing.  Lynn Dunn  Once blood pressure WNL and heart rate controlled will consider d/c.

## 2012-05-13 NOTE — Progress Notes (Signed)
PT. HR IS IN THE LOW 60's AT THIS TIME AND B/P IS 97/44, NOTIFIED DR. VEGA  NEW ORDERS RECEIVED TO HOLD LASIX, LOPRESSOR, AND CARDIZEM THIS MORNING AT THIS TIME.  WILL CONTINUE TO MONITOR AND OBSERVE PT.

## 2012-05-14 LAB — BASIC METABOLIC PANEL
BUN: 17 mg/dL (ref 6–23)
CO2: 28 mEq/L (ref 19–32)
Chloride: 108 mEq/L (ref 96–112)
Creatinine, Ser: 0.83 mg/dL (ref 0.50–1.10)
GFR calc Af Amer: 76 mL/min — ABNORMAL LOW (ref >90)
Potassium: 4.2 mEq/L (ref 3.5–5.1)

## 2012-05-14 LAB — CBC
HCT: 31.4 % — ABNORMAL LOW (ref 36.0–46.0)
Hemoglobin: 10 g/dL — ABNORMAL LOW (ref 12.0–15.0)
MCV: 90.2 fL (ref 78.0–100.0)
RBC: 3.48 MIL/uL — ABNORMAL LOW (ref 3.87–5.11)
WBC: 8 10*3/uL (ref 4.0–10.5)

## 2012-05-14 LAB — GLUCOSE, CAPILLARY: Glucose-Capillary: 98 mg/dL (ref 70–99)

## 2012-05-14 MED ORDER — FUROSEMIDE 20 MG PO TABS: 20.0000 mg | ORAL_TABLET | Freq: Every day | ORAL | Status: DC

## 2012-05-14 MED ORDER — METOPROLOL TARTRATE 25 MG PO TABS: 25.0000 mg | ORAL_TABLET | Freq: Two times a day (BID) | ORAL | Status: DC

## 2012-05-14 NOTE — Progress Notes (Signed)
Discharged instructions explained, prescriptions given. Pt verbalizes understanding of same, stable on discharge.

## 2012-05-14 NOTE — Discharge Summary (Signed)
Physician Discharge Summary  Lynn Dunn [id number] DOB: 01/26/1931 DOA: 05/13/2012  PCP: Abigail Miyamoto, MD  Admit date: 05/13/2012 Discharge date: 05/14/2012  Time spent: > 40 minutes  Recommendations for Outpatient Follow-up:  1. Please be sure to follow up with [facility] cardiology for further evaluation and recommendations. 2. Appointment set up with Dr. Rayann Heman 05/20/12 at 1030AM  Discharge Diagnoses:  Active Problems:   Essential hypertension, benign   Atrial fibrillation   Discharge Condition: stable  Diet recommendation: cardiac   Filed Weights   05/13/12 0004 05/13/12 0459  Weight: 86.183 kg (190 lb) 90.084 kg (198 lb 9.6 oz)    History of present illness:  Patient is 79 y/o female with history of atrial fibrillation s/p ablation in the past by Dr. Rayann Heman reportedly.  Presented to the hospital with palpitations and was admitted 2ary to A fib with rvr. Symptoms resolved once patient got to [facility].  Hospital Course:  Atrial fibrillation - At this point rate controlled on cardizem and Metoprolol.  Given low normal blood pressures will decrease metoprolol to 25 mg po bid.   - Patient to follow up with Dr. Rayann Heman as indicated above - Will plan on patient continuing her coumadin with f/u with Dr. Rayann Heman and recommend holding today's dose given supratherapeutic INR.  HTN: As mentioned above will decrease metoprolol and will also decrease lasix dose.  Procedures:  none  Consultations:  None  Discharge Exam: Filed Vitals:   05/13/12 1115 05/13/12 1424 05/13/12 2121 05/14/12 0638  BP: 111/61 114/45 137/46 115/56  Pulse: 64 68 79 67  Temp:  98.3 F (36.8 C)  98.1 F (36.7 C)  TempSrc:  Oral  Oral  Resp: 16 18  16   Height:      Weight:      SpO2: 98% 98%  98%    General: Pt in NAD, Alert and Awake Cardiovascular: RRR, No MRG Respiratory: CTA BL, no wheezes  Discharge Instructions  Discharge Orders   Future Orders Complete By Expires     Call MD for:   difficulty breathing, headache or visual disturbances  As directed     Call MD for:  persistant dizziness or light-headedness  As directed     Call MD for:  temperature >100.4  As directed     Diet - low sodium heart healthy  As directed     Discharge instructions  As directed     Comments:      Please be sure to follow up with your cardiologist for further evaluation and recommendations.    Increase activity slowly  As directed         Medication List    TAKE these medications       COQ10 PO  Take 1 capsule by mouth daily.     diltiazem 120 MG 12 hr capsule  Commonly known as:  CARDIZEM SR  Take 1 capsule (120 mg total) by mouth every 12 (twelve) hours.     furosemide 20 MG tablet  Commonly known as:  LASIX  Take 1 tablet (20 mg total) by mouth daily.     levalbuterol 45 MCG/ACT inhaler  Commonly known as:  XOPENEX HFA  Inhale 1-2 puffs into the lungs every 4 (four) hours as needed for wheezing or shortness of breath (or cough; patient intolerant to plain albuterol).     metoprolol tartrate 25 MG tablet  Commonly known as:  LOPRESSOR  Take 1 tablet (25 mg total) by mouth 2 (two) times daily.  pantoprazole 40 MG tablet  Commonly known as:  PROTONIX  Take 1 tablet (40 mg total) by mouth 2 (two) times daily before a meal.     potassium chloride 10 MEQ tablet  Commonly known as:  K-DUR  Take 10 mEq by mouth 2 (two) times daily.     rosuvastatin 20 MG tablet  Commonly known as:  CRESTOR  Take 20 mg by mouth at bedtime.     TANDEM PLUS PO  Take 1 tablet by mouth every morning.     vitamin B-12 1000 MCG tablet  Commonly known as:  CYANOCOBALAMIN  Take 1,000 mcg by mouth daily.     VITAMIN D PO  Take 1 capsule by mouth daily.     warfarin 4 MG tablet  Commonly known as:  COUMADIN  Take 4 mg by mouth daily. [id number] 4mg  and take 2mg  on Fridays.     ZETIA 10 MG tablet  Generic drug:  ezetimibe  Take 10 mg by mouth daily.          The results of  significant diagnostics from this hospitalization (including imaging, microbiology, ancillary and laboratory) are listed below for reference.    Significant Diagnostic Studies: Dg Chest Portable 1 View  05/13/2012  *RADIOLOGY REPORT*  Clinical Data: Chest pain, shortness of breath, irregular heart rate  PORTABLE CHEST - 1 VIEW  Comparison: 01/24/2012  Findings: Hypoaeration with interstitial and vascular crowding. Cardiomediastinal contours are prominent, similar to prior.  Aortic atherosclerosis.  No definite pleural effusion or pneumothorax. Limited osseous evaluation.  IMPRESSION: Prominent cardiomediastinal contours are similar to prior.  Hypoaeration with interstitial vascular crowding.  No definite consolidation.   Original Report Authenticated By: Carlos Levering, M.D.     Microbiology: No results found for this or any previous visit (from the past 240 hour(s)).   Labs: Basic Metabolic Panel:  Recent Labs Lab 05/13/12 0019 05/14/12 0432  NA 142 143  K 3.2* 4.2  CL 99 108  CO2 29 28  GLUCOSE 135* 101*  BUN 24* 17  CREATININE 1.00 0.83  CALCIUM 9.4 8.3*   Liver Function Tests:  Recent Labs Lab 05/13/12 0019  AST 19  ALT 14  ALKPHOS 101  BILITOT 0.3  PROT 7.2  ALBUMIN 4.2   No results found for this basename: LIPASE, AMYLASE,  in the last 168 hours No results found for this basename: AMMONIA,  in the last 168 hours CBC:  Recent Labs Lab 05/14/12 0432  WBC 8.0  HGB 10.0*  HCT 31.4*  MCV 90.2  PLT 213   Cardiac Enzymes:  Recent Labs Lab 05/13/12 0019  TROPONINI <0.30   BNP: BNP (last 3 results) No results found for this basename: PROBNP,  in the last 8760 hours CBG:  Recent Labs Lab 05/13/12 0807 05/14/12 0802  GLUCAP 110* 98       Signed:  Velvet Bathe  [facility] 05/14/2012, 12:42 PM

## 2012-05-14 NOTE — Progress Notes (Signed)
ANTICOAGULATION CONSULT NOTE - Follow Up  Pharmacy Consult for Warfarin Indication: atrial fibrillation  Allergies  Allergen Reactions  . Albuterol     Atrial fibrillation    Patient Measurements: Height: 5\' 2"  (157.5 cm) Weight: 198 lb 9.6 oz (90.084 kg) IBW/kg (Calculated) : 50.1   Vital Signs: Temp: 98.1 F (36.7 C) (03/11 0638) Temp src: Oral (03/11 0638) BP: 115/56 mmHg (03/11 0638) Pulse Rate: 67 (03/11 0638)  Labs:  Recent Labs  05/13/12 0019 05/14/12 0432  HGB  --  10.0*  HCT  --  31.4*  PLT  --  213  LABPROT 31.7* 30.7*  INR 3.30* 3.16*  CREATININE 1.00 0.83  TROPONINI <0.30  --     Estimated Creatinine Clearance: 57.4 ml/min (by C-G formula based on Cr of 0.83).  Medications:  Prescriptions prior to admission  Medication Sig Dispense Refill  . Cholecalciferol (VITAMIN D PO) Take 1 capsule by mouth daily.      . Coenzyme Q10 (COQ10 PO) Take 1 capsule by mouth daily.      Marland Kitchen diltiazem (CARDIZEM SR) 120 MG 12 hr capsule Take 1 capsule (120 mg total) by mouth every 12 (twelve) hours.  30 capsule  0  . FeFum-FePo-FA-B Cmp-C-Zn-Mn-Cu (TANDEM PLUS PO) Take 1 tablet by mouth every morning.      . furosemide (LASIX) 40 MG tablet Take 40 mg by mouth daily.        . metoprolol (LOPRESSOR) 50 MG tablet Take 50 mg by mouth 2 (two) times daily.       . pantoprazole (PROTONIX) 40 MG tablet Take 1 tablet (40 mg total) by mouth 2 (two) times daily before a meal.  60 tablet  0  . potassium chloride (K-DUR) 10 MEQ tablet Take 10 mEq by mouth 2 (two) times daily.       . rosuvastatin (CRESTOR) 20 MG tablet Take 20 mg by mouth at bedtime.      . vitamin B-12 (CYANOCOBALAMIN) 1000 MCG tablet Take 1,000 mcg by mouth daily.        Marland Kitchen warfarin (COUMADIN) 4 MG tablet Take 4 mg by mouth daily. MTWThSaSu 4mg  and take 2mg  on Fridays.      Marland Kitchen ZETIA 10 MG tablet Take 10 mg by mouth daily.       Marland Kitchen levalbuterol (XOPENEX HFA) 45 MCG/ACT inhaler Inhale 1-2 puffs into the lungs every 4  (four) hours as needed for wheezing or shortness of breath (or cough; patient intolerant to plain albuterol).  1 Inhaler  0    Assessment: 79 yo F admitted 3/10 with palpitations. Pt is on chronic warfarin for hx of Afib and stroke. Home warfarin dose reported as 2mg  on Fridays, 4mg  on all other days, last taken 3/9. Admit INR > 3, today's INR still >3. No bleeding reported in chart notes.  Goal of Therapy:  INR 2-3    Plan:  1) Continue to hold warfarin. 2) Daily INR 3) Plan to resume warfarin dosing once INR <3.  Verdia Kuba, PharmD Pager: [phone] 05/14/2012,9:24 AM

## 2012-05-16 DIAGNOSIS — M1710 Unilateral primary osteoarthritis, unspecified knee: Secondary | ICD-10-CM | POA: Diagnosis not present

## 2012-05-16 DIAGNOSIS — M25569 Pain in unspecified knee: Secondary | ICD-10-CM | POA: Diagnosis not present

## 2012-05-20 ENCOUNTER — Ambulatory Visit (INDEPENDENT_AMBULATORY_CARE_PROVIDER_SITE_OTHER): Payer: Medicare Other | Admitting: Internal Medicine

## 2012-05-20 ENCOUNTER — Encounter: Payer: Self-pay | Admitting: Internal Medicine

## 2012-05-20 VITALS — BP 118/80 | HR 76 | Ht 62.0 in | Wt 192.4 lb

## 2012-05-20 DIAGNOSIS — I10 Essential (primary) hypertension: Secondary | ICD-10-CM | POA: Diagnosis not present

## 2012-05-20 DIAGNOSIS — G4730 Sleep apnea, unspecified: Secondary | ICD-10-CM

## 2012-05-20 DIAGNOSIS — I4891 Unspecified atrial fibrillation: Secondary | ICD-10-CM | POA: Diagnosis not present

## 2012-05-20 NOTE — Patient Instructions (Addendum)
Your physician wants you to follow-up in: 6 months with Dr. Allred. You will receive a reminder letter in the mail two months in advance. If you don't receive a letter, please call our office to schedule the follow-up appointment.  

## 2012-05-20 NOTE — Progress Notes (Signed)
PCP: Abigail Miyamoto, MD Primary Cardiologist:  Dr Irish Lack  The patient presents today for routine electrophysiology followup.  Since last being seen in our clinic, the patient reports doing very well.  She had an episode of afib several weeks ago which spontaneously terminated in the ER.  She is unaware of any triggers or precipitants for her afib.  Her SOB is improved.  She is otherwise without complaint today.  She is otherwise doing well and is without complaint today.    Past Medical History  Diagnosis Date  . HTN (hypertension)   . PAF (paroxysmal atrial fibrillation)     s/p ablation 07/29/09  . COPD (chronic obstructive pulmonary disease)   . GERD (gastroesophageal reflux disease)   . Obesity   . Stroke 6/09  . Anginal pain 2011    had to take nitro   . OSA (obstructive sleep apnea)     cpap at home  . Heart murmur   . Anemia    Past Surgical History  Procedure Laterality Date  . Atrial ablation surgery      s/p ablation for afib 07/29/09  . Abdominal hysterectomy    . Esophagogastroduodenoscopy  01/24/2012    Procedure: ESOPHAGOGASTRODUODENOSCOPY (EGD);  Surgeon: Jeryl Columbia, MD;  Location: [facility] ENDOSCOPY;  Service: Endoscopy;  Laterality: N/A;    Current Outpatient Prescriptions  Medication Sig Dispense Refill  . Cholecalciferol (VITAMIN D PO) Take 1 capsule by mouth daily.      . Coenzyme Q10 (COQ10 PO) Take 1 capsule by mouth daily.      Marland Kitchen diltiazem (CARDIZEM SR) 120 MG 12 hr capsule Take 1 capsule (120 mg total) by mouth every 12 (twelve) hours.  30 capsule  0  . FeFum-FePo-FA-B Cmp-C-Zn-Mn-Cu (TANDEM PLUS PO) Take 1 tablet by mouth every morning.      . furosemide (LASIX) 20 MG tablet Take 1 tablet (20 mg total) by mouth daily.  30 tablet  0  . levalbuterol (XOPENEX HFA) 45 MCG/ACT inhaler Inhale 1-2 puffs into the lungs every 4 (four) hours as needed for wheezing or shortness of breath (or cough; patient intolerant to plain albuterol).  1 Inhaler  0  . metoprolol  (LOPRESSOR) 25 MG tablet Take 1 tablet (25 mg total) by mouth 2 (two) times daily.  60 tablet  0  . pantoprazole (PROTONIX) 40 MG tablet Take 1 tablet (40 mg total) by mouth 2 (two) times daily before a meal.  60 tablet  0  . potassium chloride (K-DUR) 10 MEQ tablet Take 10 mEq by mouth 2 (two) times daily.       . rosuvastatin (CRESTOR) 20 MG tablet Take 20 mg by mouth at bedtime.      . vitamin B-12 (CYANOCOBALAMIN) 1000 MCG tablet Take 1,000 mcg by mouth daily.        Marland Kitchen warfarin (COUMADIN) 4 MG tablet Take 4 mg by mouth daily. MTWThSaSu 4mg  and take 2mg  on Fridays.      Marland Kitchen ZETIA 10 MG tablet Take 10 mg by mouth daily.        No current facility-administered medications for this visit.    Allergies  Allergen Reactions  . Albuterol     Atrial fibrillation    History   Social History  . Marital Status: Married    Spouse Name: N/A    Number of Children: N/A  . Years of Education: N/A   Occupational History  . Not on file.   Social History Main Topics  .  Smoking status: Never Smoker   . Smokeless tobacco: Not on file  . Alcohol Use: No  . Drug Use: No  . Sexually Active: Not on file   Other Topics Concern  . Not on file   Social History Narrative  . No narrative on file    Family History  Problem Relation Age of Onset  . Breast cancer    . Brain cancer    . Breast cancer Mother   . Brain cancer Father    Physical Exam: Filed Vitals:   05/20/12 1035  BP: 118/80  Pulse: 76  Height: 5\' 2"  (1.575 m)  Weight: 192 lb 6.4 oz (87.272 kg)    GEN- The patient is well appearing, alert and oriented x 3 today.   Head- normocephalic, atraumatic Eyes-  Sclera clear, conjunctiva pink Ears- hearing intact Oropharynx- clear Neck- supple, no JVP Lymph- no cervical lymphadenopathy Lungs- Clear to ausculation bilaterally, normal work of breathing Heart- Regular rate and rhythm, no murmurs, rubs or gallops, PMI not laterally displaced GI- soft, NT, ND, + BS Extremities- no  clubbing, cyanosis, or edema  EKG- sinus rhythm 68 bpm, PR 212, anteroseptal scar  Assessment and Plan:  1. afib A single recurrence since last visit Continue current medicines Consider AAD therapy if afib returns  2. OSA Continue CPAP  3. HTN Stable No change required today   Return in 6 months

## 2012-05-21 DIAGNOSIS — K259 Gastric ulcer, unspecified as acute or chronic, without hemorrhage or perforation: Secondary | ICD-10-CM | POA: Diagnosis not present

## 2012-05-21 DIAGNOSIS — K319 Disease of stomach and duodenum, unspecified: Secondary | ICD-10-CM | POA: Diagnosis not present

## 2012-05-23 DIAGNOSIS — M1710 Unilateral primary osteoarthritis, unspecified knee: Secondary | ICD-10-CM | POA: Diagnosis not present

## 2012-05-27 DIAGNOSIS — Z7901 Long term (current) use of anticoagulants: Secondary | ICD-10-CM | POA: Diagnosis not present

## 2012-05-30 DIAGNOSIS — M1710 Unilateral primary osteoarthritis, unspecified knee: Secondary | ICD-10-CM | POA: Diagnosis not present

## 2012-05-31 DIAGNOSIS — M9981 Other biomechanical lesions of cervical region: Secondary | ICD-10-CM | POA: Diagnosis not present

## 2012-05-31 DIAGNOSIS — M5030 Other cervical disc degeneration, unspecified cervical region: Secondary | ICD-10-CM | POA: Diagnosis not present

## 2012-05-31 DIAGNOSIS — M542 Cervicalgia: Secondary | ICD-10-CM | POA: Diagnosis not present

## 2012-05-31 DIAGNOSIS — R51 Headache: Secondary | ICD-10-CM | POA: Diagnosis not present

## 2012-06-06 DIAGNOSIS — M1710 Unilateral primary osteoarthritis, unspecified knee: Secondary | ICD-10-CM | POA: Diagnosis not present

## 2012-06-13 DIAGNOSIS — H02409 Unspecified ptosis of unspecified eyelid: Secondary | ICD-10-CM | POA: Diagnosis not present

## 2012-06-13 DIAGNOSIS — M1710 Unilateral primary osteoarthritis, unspecified knee: Secondary | ICD-10-CM | POA: Diagnosis not present

## 2012-06-13 DIAGNOSIS — H5340 Unspecified visual field defects: Secondary | ICD-10-CM | POA: Diagnosis not present

## 2012-06-13 DIAGNOSIS — H02839 Dermatochalasis of unspecified eye, unspecified eyelid: Secondary | ICD-10-CM | POA: Diagnosis not present

## 2012-06-13 DIAGNOSIS — H02429 Myogenic ptosis of unspecified eyelid: Secondary | ICD-10-CM | POA: Diagnosis not present

## 2012-06-27 DIAGNOSIS — Z7901 Long term (current) use of anticoagulants: Secondary | ICD-10-CM | POA: Diagnosis not present

## 2012-06-28 DIAGNOSIS — M5030 Other cervical disc degeneration, unspecified cervical region: Secondary | ICD-10-CM | POA: Diagnosis not present

## 2012-06-28 DIAGNOSIS — R51 Headache: Secondary | ICD-10-CM | POA: Diagnosis not present

## 2012-06-28 DIAGNOSIS — M542 Cervicalgia: Secondary | ICD-10-CM | POA: Diagnosis not present

## 2012-06-28 DIAGNOSIS — M9981 Other biomechanical lesions of cervical region: Secondary | ICD-10-CM | POA: Diagnosis not present

## 2012-07-08 DIAGNOSIS — L908 Other atrophic disorders of skin: Secondary | ICD-10-CM | POA: Diagnosis not present

## 2012-07-08 DIAGNOSIS — I4891 Unspecified atrial fibrillation: Secondary | ICD-10-CM | POA: Diagnosis not present

## 2012-07-08 DIAGNOSIS — H93299 Other abnormal auditory perceptions, unspecified ear: Secondary | ICD-10-CM | POA: Diagnosis not present

## 2012-07-08 DIAGNOSIS — H02429 Myogenic ptosis of unspecified eyelid: Secondary | ICD-10-CM | POA: Diagnosis not present

## 2012-07-08 DIAGNOSIS — I10 Essential (primary) hypertension: Secondary | ICD-10-CM | POA: Diagnosis not present

## 2012-07-08 DIAGNOSIS — H02839 Dermatochalasis of unspecified eye, unspecified eyelid: Secondary | ICD-10-CM | POA: Diagnosis not present

## 2012-07-08 DIAGNOSIS — H539 Unspecified visual disturbance: Secondary | ICD-10-CM | POA: Diagnosis not present

## 2012-07-08 DIAGNOSIS — J449 Chronic obstructive pulmonary disease, unspecified: Secondary | ICD-10-CM | POA: Diagnosis not present

## 2012-07-08 DIAGNOSIS — H02409 Unspecified ptosis of unspecified eyelid: Secondary | ICD-10-CM | POA: Diagnosis not present

## 2012-07-12 DIAGNOSIS — H02409 Unspecified ptosis of unspecified eyelid: Secondary | ICD-10-CM | POA: Diagnosis not present

## 2012-07-12 DIAGNOSIS — L908 Other atrophic disorders of skin: Secondary | ICD-10-CM | POA: Diagnosis not present

## 2012-07-12 DIAGNOSIS — H539 Unspecified visual disturbance: Secondary | ICD-10-CM | POA: Diagnosis not present

## 2012-07-12 DIAGNOSIS — H02429 Myogenic ptosis of unspecified eyelid: Secondary | ICD-10-CM | POA: Diagnosis not present

## 2012-07-12 DIAGNOSIS — H93299 Other abnormal auditory perceptions, unspecified ear: Secondary | ICD-10-CM | POA: Diagnosis not present

## 2012-07-12 DIAGNOSIS — H02839 Dermatochalasis of unspecified eye, unspecified eyelid: Secondary | ICD-10-CM | POA: Diagnosis not present

## 2012-07-12 DIAGNOSIS — H5340 Unspecified visual field defects: Secondary | ICD-10-CM | POA: Diagnosis not present

## 2012-07-17 DIAGNOSIS — I10 Essential (primary) hypertension: Secondary | ICD-10-CM | POA: Diagnosis not present

## 2012-07-17 DIAGNOSIS — I4891 Unspecified atrial fibrillation: Secondary | ICD-10-CM | POA: Diagnosis not present

## 2012-07-19 DIAGNOSIS — M542 Cervicalgia: Secondary | ICD-10-CM | POA: Diagnosis not present

## 2012-07-19 DIAGNOSIS — M9981 Other biomechanical lesions of cervical region: Secondary | ICD-10-CM | POA: Diagnosis not present

## 2012-07-19 DIAGNOSIS — R51 Headache: Secondary | ICD-10-CM | POA: Diagnosis not present

## 2012-07-19 DIAGNOSIS — M5030 Other cervical disc degeneration, unspecified cervical region: Secondary | ICD-10-CM | POA: Diagnosis not present

## 2012-07-30 DIAGNOSIS — Z7901 Long term (current) use of anticoagulants: Secondary | ICD-10-CM | POA: Diagnosis not present

## 2012-08-06 DIAGNOSIS — I4891 Unspecified atrial fibrillation: Secondary | ICD-10-CM | POA: Diagnosis not present

## 2012-08-13 DIAGNOSIS — I4891 Unspecified atrial fibrillation: Secondary | ICD-10-CM | POA: Diagnosis not present

## 2012-08-15 DIAGNOSIS — R51 Headache: Secondary | ICD-10-CM | POA: Diagnosis not present

## 2012-08-15 DIAGNOSIS — M9981 Other biomechanical lesions of cervical region: Secondary | ICD-10-CM | POA: Diagnosis not present

## 2012-08-15 DIAGNOSIS — M542 Cervicalgia: Secondary | ICD-10-CM | POA: Diagnosis not present

## 2012-08-15 DIAGNOSIS — M5030 Other cervical disc degeneration, unspecified cervical region: Secondary | ICD-10-CM | POA: Diagnosis not present

## 2012-08-26 DIAGNOSIS — Z7901 Long term (current) use of anticoagulants: Secondary | ICD-10-CM | POA: Diagnosis not present

## 2012-09-02 DIAGNOSIS — I4891 Unspecified atrial fibrillation: Secondary | ICD-10-CM | POA: Diagnosis not present

## 2012-09-10 DIAGNOSIS — Z7901 Long term (current) use of anticoagulants: Secondary | ICD-10-CM | POA: Diagnosis not present

## 2012-09-10 DIAGNOSIS — E782 Mixed hyperlipidemia: Secondary | ICD-10-CM | POA: Diagnosis not present

## 2012-09-10 DIAGNOSIS — I4891 Unspecified atrial fibrillation: Secondary | ICD-10-CM | POA: Diagnosis not present

## 2012-09-16 DIAGNOSIS — L570 Actinic keratosis: Secondary | ICD-10-CM | POA: Diagnosis not present

## 2012-09-16 DIAGNOSIS — D485 Neoplasm of uncertain behavior of skin: Secondary | ICD-10-CM | POA: Diagnosis not present

## 2012-09-24 DIAGNOSIS — Z7901 Long term (current) use of anticoagulants: Secondary | ICD-10-CM | POA: Diagnosis not present

## 2012-10-08 DIAGNOSIS — E782 Mixed hyperlipidemia: Secondary | ICD-10-CM | POA: Diagnosis not present

## 2012-10-08 DIAGNOSIS — Z79899 Other long term (current) drug therapy: Secondary | ICD-10-CM | POA: Diagnosis not present

## 2012-10-08 DIAGNOSIS — Z7901 Long term (current) use of anticoagulants: Secondary | ICD-10-CM | POA: Diagnosis not present

## 2012-10-22 DIAGNOSIS — I4891 Unspecified atrial fibrillation: Secondary | ICD-10-CM | POA: Diagnosis not present

## 2012-11-05 DIAGNOSIS — Z7901 Long term (current) use of anticoagulants: Secondary | ICD-10-CM | POA: Diagnosis not present

## 2012-11-27 DIAGNOSIS — S0993XA Unspecified injury of face, initial encounter: Secondary | ICD-10-CM | POA: Diagnosis not present

## 2012-11-27 DIAGNOSIS — Z79899 Other long term (current) drug therapy: Secondary | ICD-10-CM | POA: Diagnosis not present

## 2012-11-27 DIAGNOSIS — S93409A Sprain of unspecified ligament of unspecified ankle, initial encounter: Secondary | ICD-10-CM | POA: Diagnosis not present

## 2012-11-27 DIAGNOSIS — S0180XA Unspecified open wound of other part of head, initial encounter: Secondary | ICD-10-CM | POA: Diagnosis not present

## 2012-11-27 DIAGNOSIS — S62609A Fracture of unspecified phalanx of unspecified finger, initial encounter for closed fracture: Secondary | ICD-10-CM | POA: Diagnosis not present

## 2012-11-27 DIAGNOSIS — IMO0002 Reserved for concepts with insufficient information to code with codable children: Secondary | ICD-10-CM | POA: Diagnosis not present

## 2012-11-27 DIAGNOSIS — S8000XA Contusion of unspecified knee, initial encounter: Secondary | ICD-10-CM | POA: Diagnosis not present

## 2012-11-27 DIAGNOSIS — S0990XA Unspecified injury of head, initial encounter: Secondary | ICD-10-CM | POA: Diagnosis not present

## 2012-11-28 DIAGNOSIS — IMO0002 Reserved for concepts with insufficient information to code with codable children: Secondary | ICD-10-CM | POA: Diagnosis not present

## 2012-12-04 DIAGNOSIS — Z7901 Long term (current) use of anticoagulants: Secondary | ICD-10-CM | POA: Diagnosis not present

## 2012-12-04 DIAGNOSIS — T148XXA Other injury of unspecified body region, initial encounter: Secondary | ICD-10-CM | POA: Diagnosis not present

## 2012-12-05 DIAGNOSIS — IMO0002 Reserved for concepts with insufficient information to code with codable children: Secondary | ICD-10-CM | POA: Diagnosis not present

## 2012-12-05 DIAGNOSIS — M25569 Pain in unspecified knee: Secondary | ICD-10-CM | POA: Diagnosis not present

## 2012-12-10 ENCOUNTER — Ambulatory Visit: Payer: Medicare Other | Admitting: Interventional Cardiology

## 2012-12-11 DIAGNOSIS — Z23 Encounter for immunization: Secondary | ICD-10-CM | POA: Diagnosis not present

## 2012-12-17 DIAGNOSIS — R51 Headache: Secondary | ICD-10-CM | POA: Diagnosis not present

## 2012-12-17 DIAGNOSIS — M9981 Other biomechanical lesions of cervical region: Secondary | ICD-10-CM | POA: Diagnosis not present

## 2012-12-17 DIAGNOSIS — M5030 Other cervical disc degeneration, unspecified cervical region: Secondary | ICD-10-CM | POA: Diagnosis not present

## 2012-12-17 DIAGNOSIS — M542 Cervicalgia: Secondary | ICD-10-CM | POA: Diagnosis not present

## 2012-12-19 DIAGNOSIS — IMO0002 Reserved for concepts with insufficient information to code with codable children: Secondary | ICD-10-CM | POA: Diagnosis not present

## 2012-12-27 ENCOUNTER — Encounter: Payer: Self-pay | Admitting: Interventional Cardiology

## 2012-12-27 ENCOUNTER — Encounter (INDEPENDENT_AMBULATORY_CARE_PROVIDER_SITE_OTHER): Payer: Self-pay

## 2012-12-27 ENCOUNTER — Ambulatory Visit (INDEPENDENT_AMBULATORY_CARE_PROVIDER_SITE_OTHER): Payer: Medicare Other | Admitting: Interventional Cardiology

## 2012-12-27 VITALS — BP 106/70 | HR 80 | Ht 62.5 in | Wt 193.8 lb

## 2012-12-27 DIAGNOSIS — I10 Essential (primary) hypertension: Secondary | ICD-10-CM

## 2012-12-27 DIAGNOSIS — Z8673 Personal history of transient ischemic attack (TIA), and cerebral infarction without residual deficits: Secondary | ICD-10-CM

## 2012-12-27 DIAGNOSIS — E782 Mixed hyperlipidemia: Secondary | ICD-10-CM | POA: Insufficient documentation

## 2012-12-27 DIAGNOSIS — I4891 Unspecified atrial fibrillation: Secondary | ICD-10-CM

## 2012-12-27 DIAGNOSIS — E669 Obesity, unspecified: Secondary | ICD-10-CM

## 2012-12-27 NOTE — Progress Notes (Signed)
Patient ID: GWENDOLY BALDWIN, female   DOB: 11-12-1930, 79 y.o.   MRN: [id number]    [place], [address], [place]  [address] Phone: [phone] Fax:  [phone]  Date:  12/27/2012   ID:  Syndi, [place] Dec 05, 1930, MRN [id number]  PCP:  Abigail Miyamoto, MD      History of Present Illness: RUBITH CROTWELL is a 79 y.o. female is a 79 yo female followed by Dr Maceo Pro , Dr Rayann Heman, and Dr Irish Lack. She has missed her last appointments with Dr Irish Lack, and last seen 01/2011. She has a hx of Hypertension, Atrial fibrillation , S/P ablation, anemia, Hypertension, and CVA on Coumadin therapy followed by Dr Maceo Pro. She was hopsitalized overnight on May 14, 2012, after recurrent Atrial fibrillation with RVR that spontaneously converted in ED without therapy. Hbg stable at 10, INR 3.16, K 4.2, creat 0.83. She was monitored over night.The ED physician had recommend she decrease her Metoprolol but she did not since, she had taken Clonidine and extra Diltiazem at home that day when she went into the Atrial fibrillation. She had f/u with Dr Rayann Heman 05/20/12, was stable and he did not change her Cardizem nor Metoprolol.   She fell after tripping and broke a finger, and required stitches in her forehead.  She is better now.  No syncope.  No severe bleeding issues.  No further episodes of AFib since the episode in May described above.    Wt Readings from Last 3 Encounters:  12/27/12 193 lb 12.8 oz (87.907 kg)  05/20/12 192 lb 6.4 oz (87.272 kg)  05/13/12 198 lb 9.6 oz (90.084 kg)     Past Medical History  Diagnosis Date  . HTN (hypertension)   . PAF (paroxysmal atrial fibrillation)     s/p ablation 07/29/09  . COPD (chronic obstructive pulmonary disease)   . GERD (gastroesophageal reflux disease)   . Obesity   . Stroke 6/09  . Anginal pain 2011    had to take nitro   . OSA (obstructive sleep apnea)     cpap at home  . Heart murmur   . Anemia     Current Outpatient Prescriptions   Medication Sig Dispense Refill  . Cholecalciferol (VITAMIN D PO) Take 1 capsule by mouth daily.      . Coenzyme Q10 (COQ10 PO) Take 1 capsule by mouth daily.      Marland Kitchen diltiazem (CARDIZEM SR) 120 MG 12 hr capsule Take 120 mg by mouth. Once a day      . furosemide (LASIX) 20 MG tablet Take 20 mg by mouth 2 (two) times daily.      . metoprolol (LOPRESSOR) 25 MG tablet Take 1 tablet (25 mg total) by mouth 2 (two) times daily.  60 tablet  0  . potassium chloride (K-DUR) 10 MEQ tablet Take 10 mEq by mouth 2 (two) times daily.       . ranitidine (ZANTAC) 150 MG capsule Take 150 mg by mouth as needed for heartburn.      . rosuvastatin (CRESTOR) 20 MG tablet Take 20 mg by mouth at bedtime.      Marland Kitchen warfarin (COUMADIN) 3 MG tablet Take 3 mg by mouth daily.      Marland Kitchen ZETIA 10 MG tablet Take 10 mg by mouth daily.        No current facility-administered medications for this visit.    Allergies:    Allergies  Allergen Reactions  . Albuterol  Atrial fibrillation    Social History:  The patient  reports that she has never smoked. She does not have any smokeless tobacco history on file. She reports that she does not drink alcohol or use illicit drugs.   Family History:  The patient's family history includes Brain cancer in her father and another family member; Breast cancer in her mother and another family member.   ROS:  Please see the history of present illness.  No nausea, vomiting.  No fevers, chills.  No focal weakness.  No dysuria. LE edema;  All other systems reviewed and negative.   PHYSICAL EXAM: VS:  BP 106/70  Pulse 80  Ht 5' 2.5" (1.588 m)  Wt 193 lb 12.8 oz (87.907 kg)  BMI 34.86 kg/m2 Well nourished, well developed, in no acute distress HEENT: normal Neck: no JVD, no carotid bruits Cardiac:  normal S1, S2; RRR; 2/6 systolic murmur Lungs:  clear to auscultation bilaterally, no wheezing, rhonchi or rales Abd: soft, nontender, no hepatomegaly Ext: no edema Skin: warm and dry Neuro:    no focal abnormalities noted     ASSESSMENT AND PLAN:  Atrial fibrillation  Continue Diltiazem HCl CR Capsule Extended Release 12 Hour, 120 MG, 1 capsule, Orally, Once a day Continue Warfarin Sodium Tablet, 4 MG, 1 tablet, Orally, 4 mg x 6 days and 2 mg on Friday Continue Metoprolol Tartrate Tablet, 50 MG, 1 tablet, Twice daily IMAGING: EKG NSR   Harward,Amy 07/17/2012 10:39:09 AM > FERGUSON,CYNTHIA A 07/17/2012 12:04:03 PM > also see scanned report   Notes: see scanned EKGs also.Marland KitchenNSR without ectopy, I will update Dr Rayann Heman and Dr Irish Lack of her possible recurrent brief Atrial fibrillation this am, lasting < 10 minutes per pt's reported symptoms, she declines cardiac monitor, I will not adjust Cardizem nor Metoprolol at this time due to systloc BP 100-110. we may need to consider decreasing Lasix in order to allow higher BP if furthter medication adjustments are needed. I have rescheduled pt to f/u with Dr Irish Lack in 6 weeks and stressed need for f/u compliancy.  2. Essential hypertension, benign  Continue Furosemide Tablet, 40 MG, 1 tablet, Orally, daily Continue Potassium Chloride CR Tablet Extended Release, 10 MEQ, 1 tablet, Orally, twice a day  3.  Hyerlipidemia:  LDL-p number elevated, nearly 1700.  Continue crestor and Zetia.   4. Obesity:  Doing silver sneakers exercise.  She lost weight with weight watchers in the past.  I recommended that she try that again.  5. History of CVA treated with TPA  Signed, Mina Marble, MD, [facility] 12/27/2012 1:25 PM

## 2012-12-27 NOTE — Patient Instructions (Signed)
Your physician wants you to follow-up in: 1 year with Dr. Irish Lack. You will receive a reminder letter in the mail two months in advance. If you don't receive a letter, please call our office to schedule the follow-up appointment.  Your physician recommends that you continue on your current medications as directed. Please refer to the Current Medication list given to you today.

## 2013-01-01 DIAGNOSIS — Z7901 Long term (current) use of anticoagulants: Secondary | ICD-10-CM | POA: Diagnosis not present

## 2013-01-01 DIAGNOSIS — Z5181 Encounter for therapeutic drug level monitoring: Secondary | ICD-10-CM | POA: Diagnosis not present

## 2013-01-17 DIAGNOSIS — M9981 Other biomechanical lesions of cervical region: Secondary | ICD-10-CM | POA: Diagnosis not present

## 2013-01-17 DIAGNOSIS — R51 Headache: Secondary | ICD-10-CM | POA: Diagnosis not present

## 2013-01-17 DIAGNOSIS — M5030 Other cervical disc degeneration, unspecified cervical region: Secondary | ICD-10-CM | POA: Diagnosis not present

## 2013-01-17 DIAGNOSIS — M542 Cervicalgia: Secondary | ICD-10-CM | POA: Diagnosis not present

## 2013-01-28 DIAGNOSIS — I4891 Unspecified atrial fibrillation: Secondary | ICD-10-CM | POA: Diagnosis not present

## 2013-01-28 DIAGNOSIS — Z7901 Long term (current) use of anticoagulants: Secondary | ICD-10-CM | POA: Diagnosis not present

## 2013-02-28 ENCOUNTER — Other Ambulatory Visit: Payer: Self-pay

## 2013-02-28 MED ORDER — FUROSEMIDE 20 MG PO TABS: 20.0000 mg | ORAL_TABLET | Freq: Two times a day (BID) | ORAL | Status: DC

## 2013-03-04 DIAGNOSIS — R51 Headache: Secondary | ICD-10-CM | POA: Diagnosis not present

## 2013-03-04 DIAGNOSIS — M9981 Other biomechanical lesions of cervical region: Secondary | ICD-10-CM | POA: Diagnosis not present

## 2013-03-04 DIAGNOSIS — M5030 Other cervical disc degeneration, unspecified cervical region: Secondary | ICD-10-CM | POA: Diagnosis not present

## 2013-03-04 DIAGNOSIS — M542 Cervicalgia: Secondary | ICD-10-CM | POA: Diagnosis not present

## 2013-03-05 ENCOUNTER — Other Ambulatory Visit: Payer: Self-pay

## 2013-03-05 ENCOUNTER — Telehealth: Payer: Self-pay | Admitting: Interventional Cardiology

## 2013-03-05 MED ORDER — FUROSEMIDE 20 MG PO TABS: 20.0000 mg | ORAL_TABLET | Freq: Two times a day (BID) | ORAL | Status: DC

## 2013-03-05 MED ORDER — POTASSIUM CHLORIDE ER 10 MEQ PO TBCR: 10.0000 meq | EXTENDED_RELEASE_TABLET | Freq: Two times a day (BID) | ORAL | Status: DC

## 2013-03-05 NOTE — Telephone Encounter (Signed)
New Message  Pt called request a refill// sent to refill depart.

## 2013-03-11 ENCOUNTER — Other Ambulatory Visit: Payer: Self-pay

## 2013-03-11 MED ORDER — FUROSEMIDE 20 MG PO TABS: 20.0000 mg | ORAL_TABLET | Freq: Two times a day (BID) | ORAL | Status: DC

## 2013-03-13 ENCOUNTER — Telehealth: Payer: Self-pay | Admitting: Cardiology

## 2013-03-13 NOTE — Telephone Encounter (Signed)
New message     CPAP pt---need supplies for machine but need a presc from Dr Radford Pax to get them---need water chamber for machine and replacement mask.  Pls mail presc to pt.

## 2013-03-13 NOTE — Telephone Encounter (Signed)
Pt coming in to see Dr Radford Pax 1/20. Hasnt been seen in 3 years. Pt is going to [place] prior to her Appt to get a download.

## 2013-03-14 ENCOUNTER — Other Ambulatory Visit: Payer: Self-pay

## 2013-03-14 MED ORDER — FUROSEMIDE 20 MG PO TABS: 20.0000 mg | ORAL_TABLET | Freq: Two times a day (BID) | ORAL | Status: DC

## 2013-03-17 DIAGNOSIS — I4891 Unspecified atrial fibrillation: Secondary | ICD-10-CM | POA: Diagnosis not present

## 2013-03-17 DIAGNOSIS — Z7901 Long term (current) use of anticoagulants: Secondary | ICD-10-CM | POA: Diagnosis not present

## 2013-03-20 ENCOUNTER — Ambulatory Visit (INDEPENDENT_AMBULATORY_CARE_PROVIDER_SITE_OTHER): Payer: Medicare Other | Admitting: Cardiology

## 2013-03-20 ENCOUNTER — Encounter: Payer: Self-pay | Admitting: Cardiology

## 2013-03-20 VITALS — BP 120/68 | HR 64 | Ht 62.5 in | Wt 191.0 lb

## 2013-03-20 DIAGNOSIS — G4733 Obstructive sleep apnea (adult) (pediatric): Secondary | ICD-10-CM | POA: Diagnosis not present

## 2013-03-20 DIAGNOSIS — E669 Obesity, unspecified: Secondary | ICD-10-CM | POA: Diagnosis not present

## 2013-03-20 DIAGNOSIS — I10 Essential (primary) hypertension: Secondary | ICD-10-CM

## 2013-03-20 NOTE — Progress Notes (Signed)
[place], [address], [place]  [address] Phone: [phone] Fax:  [phone]  Date:  03/20/2013   ID:  Lynn Dunn, DOB [birth year], MRN [id number]  PCP:  Abigail Miyamoto, MD  Sleep Medicine:  Fransico Him, MD   History of Present Illness: Lynn Dunn is a 81 y.o. female with a history of OSA on CPAP, obesity  and HTN who presents today for followup.  She is doing well.  She tolerates her CPAP therapy well.  She feels the pressure is adequate.  She tolerates her nasal mask well and does not use a chin strap.  She feels rested in the am and has no daytime sleepiness.  She exercises in silver sneakers.   Wt Readings from Last 3 Encounters:  03/20/13 191 lb (86.637 kg)  12/27/12 193 lb 12.8 oz (87.907 kg)  05/20/12 192 lb 6.4 oz (87.272 kg)     Past Medical History  Diagnosis Date  . HTN (hypertension)   . PAF (paroxysmal atrial fibrillation)     s/p ablation 07/29/09  . COPD (chronic obstructive pulmonary disease)   . GERD (gastroesophageal reflux disease)   . Obesity   . Stroke 6/09  . Anginal pain 2011    had to take nitro   . OSA (obstructive sleep apnea)     cpap at home  . Heart murmur   . Anemia     Current Outpatient Prescriptions  Medication Sig Dispense Refill  . Cholecalciferol (VITAMIN D PO) Take 1 capsule by mouth daily.      . Coenzyme Q10 (COQ10 PO) Take 1 capsule by mouth daily.      Marland Kitchen diltiazem (CARDIZEM SR) 120 MG 12 hr capsule Take 120 mg by mouth. Once a day      . furosemide (LASIX) 20 MG tablet Take 1 tablet (20 mg total) by mouth 2 (two) times daily.  90 tablet  3  . metoprolol (LOPRESSOR) 25 MG tablet Take 1 tablet (25 mg total) by mouth 2 (two) times daily.  60 tablet  0  . potassium chloride (K-DUR) 10 MEQ tablet Take 1 tablet (10 mEq total) by mouth 2 (two) times daily.  180 tablet  3  . ranitidine (ZANTAC) 150 MG capsule Take 150 mg by mouth as needed for heartburn.      . rosuvastatin (CRESTOR) 20 MG tablet Take 20 mg by  mouth 2 (two) times daily.       Marland Kitchen warfarin (COUMADIN) 3 MG tablet Take 3 mg by mouth daily.      Marland Kitchen ZETIA 10 MG tablet Take 10 mg by mouth daily.        No current facility-administered medications for this visit.    Allergies:    Allergies  Allergen Reactions  . Albuterol     Atrial fibrillation    Social History:  The patient  reports that she has never smoked. She does not have any smokeless tobacco history on file. She reports that she does not drink alcohol or use illicit drugs.   Family History:  The patient's family history includes Brain cancer in her father and another family member; Breast cancer in her mother and another family member.   ROS:  Please see the history of present illness.      All other systems reviewed and negative.   PHYSICAL EXAM: VS:  BP 120/68  Pulse 64  Ht 5' 2.5" (1.588 m)  Wt 191 lb (86.637 kg)  BMI 34.36 kg/m2  Well nourished, well developed, in no acute distress HEENT: normal Neck: no JVD Cardiac:  normal S1, S2; RRR; no murmur Lungs:  clear to auscultation bilaterally, no wheezing, rhonchi or rales Abd: soft, nontender, no hepatomegaly Ext: no edema Skin: warm and dry Neuro:  CNs 2-12 intact, no focal abnormalities noted       ASSESSMENT AND PLAN:  1. OSA on CPAP and tolerating well  - I will get a d/l from her DMR 2. HTN - well controlled  - continue diltiazem/metoprolol 3. Obesity - continue in current exercise program and follow a low carb diet and watch her portions  Followup with me in 6 months  Signed, Fransico Him, MD 03/20/2013 10:05 AM

## 2013-03-20 NOTE — Patient Instructions (Signed)
Your physician recommends that you continue on your current medications as directed. Please refer to the Current Medication list given to you today.  We ordered a new Nasal mask and water chamber for you with [employer].   Your physician wants you to follow-up in: 6 months with Dr Mallie Snooks will receive a reminder letter in the mail two months in advance. If you don't receive a letter, please call our office to schedule the follow-up appointment.

## 2013-03-21 ENCOUNTER — Telehealth: Payer: Self-pay | Admitting: *Deleted

## 2013-03-21 NOTE — Telephone Encounter (Signed)
Patient called stating that [facility] still has not received the rx that you were faxing for replacement parts for her c-pap machine. Patient would like for you to try to re-send it and call her when this has been done. Thanks, MI

## 2013-03-21 NOTE — Telephone Encounter (Signed)
Patient called again trying to speak with Dr Radford Pax or someone that works with her. She told me that the rx needs to be faxed to [phone], and she would like it handled asap as she is going out of town next week and really needs it. Thanks, MI

## 2013-03-23 ENCOUNTER — Other Ambulatory Visit: Payer: Self-pay | Admitting: *Deleted

## 2013-03-23 DIAGNOSIS — E782 Mixed hyperlipidemia: Secondary | ICD-10-CM

## 2013-03-24 DIAGNOSIS — Z7901 Long term (current) use of anticoagulants: Secondary | ICD-10-CM | POA: Diagnosis not present

## 2013-03-24 DIAGNOSIS — M5030 Other cervical disc degeneration, unspecified cervical region: Secondary | ICD-10-CM | POA: Diagnosis not present

## 2013-03-24 DIAGNOSIS — M542 Cervicalgia: Secondary | ICD-10-CM | POA: Diagnosis not present

## 2013-03-24 DIAGNOSIS — I6789 Other cerebrovascular disease: Secondary | ICD-10-CM | POA: Diagnosis not present

## 2013-03-24 DIAGNOSIS — R51 Headache: Secondary | ICD-10-CM | POA: Diagnosis not present

## 2013-03-24 DIAGNOSIS — M9981 Other biomechanical lesions of cervical region: Secondary | ICD-10-CM | POA: Diagnosis not present

## 2013-03-25 ENCOUNTER — Ambulatory Visit: Payer: Medicare Other | Admitting: Cardiology

## 2013-03-25 NOTE — Telephone Encounter (Signed)
Resent to the fax provided by pt and to the number I fax all orders to as well. Called pt and lvm to apologize that  I was out of the office on Friday and Monday and was not able to fix this issue until today.

## 2013-03-29 DIAGNOSIS — J209 Acute bronchitis, unspecified: Secondary | ICD-10-CM | POA: Diagnosis not present

## 2013-03-31 DIAGNOSIS — J209 Acute bronchitis, unspecified: Secondary | ICD-10-CM | POA: Diagnosis not present

## 2013-04-18 ENCOUNTER — Other Ambulatory Visit: Payer: Medicare Other

## 2013-04-21 ENCOUNTER — Encounter: Payer: Self-pay | Admitting: Cardiology

## 2013-05-06 DIAGNOSIS — E782 Mixed hyperlipidemia: Secondary | ICD-10-CM | POA: Diagnosis not present

## 2013-05-06 DIAGNOSIS — Z7901 Long term (current) use of anticoagulants: Secondary | ICD-10-CM | POA: Diagnosis not present

## 2013-05-06 DIAGNOSIS — I4891 Unspecified atrial fibrillation: Secondary | ICD-10-CM | POA: Diagnosis not present

## 2013-05-08 DIAGNOSIS — M542 Cervicalgia: Secondary | ICD-10-CM | POA: Diagnosis not present

## 2013-05-08 DIAGNOSIS — M5030 Other cervical disc degeneration, unspecified cervical region: Secondary | ICD-10-CM | POA: Diagnosis not present

## 2013-05-08 DIAGNOSIS — R51 Headache: Secondary | ICD-10-CM | POA: Diagnosis not present

## 2013-05-08 DIAGNOSIS — M9981 Other biomechanical lesions of cervical region: Secondary | ICD-10-CM | POA: Diagnosis not present

## 2013-05-14 ENCOUNTER — Telehealth: Payer: Self-pay | Admitting: General Surgery

## 2013-05-14 ENCOUNTER — Other Ambulatory Visit: Payer: Self-pay | Admitting: General Surgery

## 2013-05-14 DIAGNOSIS — G4733 Obstructive sleep apnea (adult) (pediatric): Secondary | ICD-10-CM

## 2013-05-14 NOTE — Telephone Encounter (Signed)
[employer] sent [place] paperwork to transition patient over to our company. Can I get a referral for cpap supplies please? -per [facility] S.  Is this ok to do for pt?  To Dr Radford Pax to advise.

## 2013-05-14 NOTE — Telephone Encounter (Signed)
Ok to order CPAP supplies 

## 2013-05-14 NOTE — Telephone Encounter (Signed)
Order in EPIC 

## 2013-05-15 ENCOUNTER — Telehealth: Payer: Self-pay | Admitting: Cardiology

## 2013-05-15 DIAGNOSIS — Z1231 Encounter for screening mammogram for malignant neoplasm of breast: Secondary | ICD-10-CM | POA: Diagnosis not present

## 2013-05-15 DIAGNOSIS — Z803 Family history of malignant neoplasm of breast: Secondary | ICD-10-CM | POA: Diagnosis not present

## 2013-05-15 NOTE — Telephone Encounter (Signed)
Ok to send this in?   To Dr Radford Pax to advise.

## 2013-05-15 NOTE — Telephone Encounter (Signed)
New Prob    Requesting prescription for [facility]AP supplies at [facility]. DX code, NPI number, printed name, and signature needed. Please call.  Faxed to [phone] Attn Gerald Stabs

## 2013-05-15 NOTE — Telephone Encounter (Signed)
yes

## 2013-05-16 ENCOUNTER — Other Ambulatory Visit: Payer: Self-pay | Admitting: General Surgery

## 2013-05-16 DIAGNOSIS — G4733 Obstructive sleep apnea (adult) (pediatric): Secondary | ICD-10-CM

## 2013-05-16 NOTE — Telephone Encounter (Signed)
Ordered and sent notification to betsy.

## 2013-05-19 DIAGNOSIS — M1710 Unilateral primary osteoarthritis, unspecified knee: Secondary | ICD-10-CM | POA: Diagnosis not present

## 2013-05-23 ENCOUNTER — Telehealth: Payer: Self-pay | Admitting: Interventional Cardiology

## 2013-05-23 ENCOUNTER — Telehealth: Payer: Self-pay | Admitting: *Deleted

## 2013-05-23 NOTE — Telephone Encounter (Signed)
New message     When is pt due for a cholesterol check?

## 2013-05-23 NOTE — Telephone Encounter (Signed)
Called patient and left message that there is an order in system for a NMR for 03/2013

## 2013-05-23 NOTE — Telephone Encounter (Signed)
Patient states that her furosemide should be 40mg  bid. Is this correct? Please advise. Thanks, MI

## 2013-05-26 ENCOUNTER — Other Ambulatory Visit: Payer: Self-pay | Admitting: *Deleted

## 2013-05-26 DIAGNOSIS — M1710 Unilateral primary osteoarthritis, unspecified knee: Secondary | ICD-10-CM | POA: Diagnosis not present

## 2013-05-26 MED ORDER — FUROSEMIDE 40 MG PO TABS: 40.0000 mg | ORAL_TABLET | Freq: Two times a day (BID) | ORAL | Status: DC | PRN

## 2013-05-26 NOTE — Telephone Encounter (Signed)
Medication was added to epic as 20 mg tablet but pt is actually taking 40 mg BID as needed.

## 2013-05-26 NOTE — Telephone Encounter (Addendum)
Ecw states Lasix 40 mg BID as necessary.

## 2013-05-26 NOTE — Telephone Encounter (Signed)
Pt was due for NMR and hepatic in 2/15.

## 2013-05-26 NOTE — Telephone Encounter (Signed)
New problem   Pt is wanting an order sent to [facility] for her labs. Please call pt before today so she can know what to do.

## 2013-05-27 DIAGNOSIS — Z7901 Long term (current) use of anticoagulants: Secondary | ICD-10-CM | POA: Diagnosis not present

## 2013-05-27 DIAGNOSIS — I6789 Other cerebrovascular disease: Secondary | ICD-10-CM | POA: Diagnosis not present

## 2013-05-27 DIAGNOSIS — I10 Essential (primary) hypertension: Secondary | ICD-10-CM | POA: Diagnosis not present

## 2013-05-27 DIAGNOSIS — D509 Iron deficiency anemia, unspecified: Secondary | ICD-10-CM | POA: Diagnosis not present

## 2013-05-27 DIAGNOSIS — E782 Mixed hyperlipidemia: Secondary | ICD-10-CM | POA: Diagnosis not present

## 2013-05-27 NOTE — Telephone Encounter (Signed)
Labs were drawn at [facility] today. I will obtain tomorrow and will give to Ysidro Evert to review.

## 2013-05-30 NOTE — Telephone Encounter (Signed)
Patient with h/o CVA and HTN, but no CAD.  LDL-P number was 1300 nmol/L this week.  This has improved from 1700 nmol/L last year.  Labs done at [facility] physician's and will get scanned into chart.  Patient on Crestor 20 mg qd and Zetia 10 mg qd.  Patient was encouraged by Dr. Irish Lack to start doing Weight Watchers again as this has worked for her in the past.  I would not change her medication regimen at this time given age and LDL-P improving.  Recheck NMR LipoProfile and hepatic panel in 6 months.  Please notify patient. Thanks.

## 2013-05-30 NOTE — Telephone Encounter (Signed)
Pt notified. Pt will have NMR LIpo and Hepatic done at Dr. Delman Kitten office in September of 2015. Faxed to Dr. Delman Kitten office so they can go ahead and schedule/order.

## 2013-05-30 NOTE — Telephone Encounter (Signed)
Labs obtained. I will give to Ysidro Evert to review and then scan in. To Ysidro Evert to document.

## 2013-06-05 DIAGNOSIS — R51 Headache: Secondary | ICD-10-CM | POA: Diagnosis not present

## 2013-06-05 DIAGNOSIS — M542 Cervicalgia: Secondary | ICD-10-CM | POA: Diagnosis not present

## 2013-06-05 DIAGNOSIS — M5030 Other cervical disc degeneration, unspecified cervical region: Secondary | ICD-10-CM | POA: Diagnosis not present

## 2013-06-05 DIAGNOSIS — M9981 Other biomechanical lesions of cervical region: Secondary | ICD-10-CM | POA: Diagnosis not present

## 2013-06-09 DIAGNOSIS — M1710 Unilateral primary osteoarthritis, unspecified knee: Secondary | ICD-10-CM | POA: Diagnosis not present

## 2013-06-16 DIAGNOSIS — M1710 Unilateral primary osteoarthritis, unspecified knee: Secondary | ICD-10-CM | POA: Diagnosis not present

## 2013-06-23 DIAGNOSIS — M1710 Unilateral primary osteoarthritis, unspecified knee: Secondary | ICD-10-CM | POA: Diagnosis not present

## 2013-06-26 DIAGNOSIS — E782 Mixed hyperlipidemia: Secondary | ICD-10-CM | POA: Diagnosis not present

## 2013-06-26 DIAGNOSIS — Z7901 Long term (current) use of anticoagulants: Secondary | ICD-10-CM | POA: Diagnosis not present

## 2013-06-26 DIAGNOSIS — Z23 Encounter for immunization: Secondary | ICD-10-CM | POA: Diagnosis not present

## 2013-06-26 DIAGNOSIS — R7301 Impaired fasting glucose: Secondary | ICD-10-CM | POA: Diagnosis not present

## 2013-06-26 DIAGNOSIS — I10 Essential (primary) hypertension: Secondary | ICD-10-CM | POA: Diagnosis not present

## 2013-06-26 DIAGNOSIS — M25579 Pain in unspecified ankle and joints of unspecified foot: Secondary | ICD-10-CM | POA: Diagnosis not present

## 2013-06-26 DIAGNOSIS — I4891 Unspecified atrial fibrillation: Secondary | ICD-10-CM | POA: Diagnosis not present

## 2013-07-01 DIAGNOSIS — H9319 Tinnitus, unspecified ear: Secondary | ICD-10-CM | POA: Diagnosis not present

## 2013-07-01 DIAGNOSIS — R42 Dizziness and giddiness: Secondary | ICD-10-CM | POA: Diagnosis not present

## 2013-07-01 DIAGNOSIS — E119 Type 2 diabetes mellitus without complications: Secondary | ICD-10-CM | POA: Diagnosis not present

## 2013-07-01 DIAGNOSIS — N183 Chronic kidney disease, stage 3 unspecified: Secondary | ICD-10-CM | POA: Diagnosis not present

## 2013-07-21 DIAGNOSIS — M961 Postlaminectomy syndrome, not elsewhere classified: Secondary | ICD-10-CM | POA: Diagnosis not present

## 2013-07-21 DIAGNOSIS — M25559 Pain in unspecified hip: Secondary | ICD-10-CM | POA: Diagnosis not present

## 2013-07-23 ENCOUNTER — Encounter: Payer: Self-pay | Admitting: Interventional Cardiology

## 2013-07-24 DIAGNOSIS — I4891 Unspecified atrial fibrillation: Secondary | ICD-10-CM | POA: Diagnosis not present

## 2013-07-24 DIAGNOSIS — Z7901 Long term (current) use of anticoagulants: Secondary | ICD-10-CM | POA: Diagnosis not present

## 2013-08-21 DIAGNOSIS — I6789 Other cerebrovascular disease: Secondary | ICD-10-CM | POA: Diagnosis not present

## 2013-08-21 DIAGNOSIS — Z7901 Long term (current) use of anticoagulants: Secondary | ICD-10-CM | POA: Diagnosis not present

## 2013-08-26 DIAGNOSIS — M47817 Spondylosis without myelopathy or radiculopathy, lumbosacral region: Secondary | ICD-10-CM | POA: Diagnosis not present

## 2013-08-26 DIAGNOSIS — M25569 Pain in unspecified knee: Secondary | ICD-10-CM | POA: Diagnosis not present

## 2013-08-28 DIAGNOSIS — Z7901 Long term (current) use of anticoagulants: Secondary | ICD-10-CM | POA: Diagnosis not present

## 2013-08-28 DIAGNOSIS — I4891 Unspecified atrial fibrillation: Secondary | ICD-10-CM | POA: Diagnosis not present

## 2013-09-08 DIAGNOSIS — M47817 Spondylosis without myelopathy or radiculopathy, lumbosacral region: Secondary | ICD-10-CM | POA: Diagnosis not present

## 2013-09-10 ENCOUNTER — Other Ambulatory Visit: Payer: Self-pay

## 2013-09-10 MED ORDER — ROSUVASTATIN CALCIUM 20 MG PO TABS: 20.0000 mg | ORAL_TABLET | Freq: Two times a day (BID) | ORAL | Status: DC

## 2013-09-12 ENCOUNTER — Telehealth: Payer: Self-pay | Admitting: Cardiology

## 2013-09-12 DIAGNOSIS — I4891 Unspecified atrial fibrillation: Secondary | ICD-10-CM | POA: Diagnosis not present

## 2013-09-12 DIAGNOSIS — Z7901 Long term (current) use of anticoagulants: Secondary | ICD-10-CM | POA: Diagnosis not present

## 2013-09-12 MED ORDER — ROSUVASTATIN CALCIUM 20 MG PO TABS: ORAL_TABLET | ORAL | Status: DC

## 2013-09-12 NOTE — Telephone Encounter (Signed)
Refilled Crestor and corrected med list. Pt is currently taking Crestor 20 mg qd not BID.

## 2013-09-18 DIAGNOSIS — M25569 Pain in unspecified knee: Secondary | ICD-10-CM | POA: Diagnosis not present

## 2013-09-26 DIAGNOSIS — I4891 Unspecified atrial fibrillation: Secondary | ICD-10-CM | POA: Diagnosis not present

## 2013-09-26 DIAGNOSIS — Z7901 Long term (current) use of anticoagulants: Secondary | ICD-10-CM | POA: Diagnosis not present

## 2013-10-21 ENCOUNTER — Other Ambulatory Visit: Payer: Self-pay | Admitting: Cardiology

## 2013-10-21 MED ORDER — POTASSIUM CHLORIDE ER 10 MEQ PO TBCR: 10.0000 meq | EXTENDED_RELEASE_TABLET | Freq: Two times a day (BID) | ORAL | Status: DC

## 2013-10-24 DIAGNOSIS — I4891 Unspecified atrial fibrillation: Secondary | ICD-10-CM | POA: Diagnosis not present

## 2013-10-24 DIAGNOSIS — Z7901 Long term (current) use of anticoagulants: Secondary | ICD-10-CM | POA: Diagnosis not present

## 2013-10-27 DIAGNOSIS — M47817 Spondylosis without myelopathy or radiculopathy, lumbosacral region: Secondary | ICD-10-CM | POA: Diagnosis not present

## 2013-11-11 DIAGNOSIS — M47817 Spondylosis without myelopathy or radiculopathy, lumbosacral region: Secondary | ICD-10-CM | POA: Diagnosis not present

## 2013-11-24 DIAGNOSIS — I4891 Unspecified atrial fibrillation: Secondary | ICD-10-CM | POA: Diagnosis not present

## 2013-11-24 DIAGNOSIS — Z7901 Long term (current) use of anticoagulants: Secondary | ICD-10-CM | POA: Diagnosis not present

## 2013-12-01 DIAGNOSIS — Z23 Encounter for immunization: Secondary | ICD-10-CM | POA: Diagnosis not present

## 2013-12-03 DIAGNOSIS — M533 Sacrococcygeal disorders, not elsewhere classified: Secondary | ICD-10-CM | POA: Diagnosis not present

## 2013-12-22 DIAGNOSIS — Z0000 Encounter for general adult medical examination without abnormal findings: Secondary | ICD-10-CM | POA: Diagnosis not present

## 2013-12-25 DIAGNOSIS — M542 Cervicalgia: Secondary | ICD-10-CM | POA: Diagnosis not present

## 2013-12-25 DIAGNOSIS — M5030 Other cervical disc degeneration, unspecified cervical region: Secondary | ICD-10-CM | POA: Diagnosis not present

## 2013-12-25 DIAGNOSIS — M9901 Segmental and somatic dysfunction of cervical region: Secondary | ICD-10-CM | POA: Diagnosis not present

## 2013-12-25 DIAGNOSIS — G441 Vascular headache, not elsewhere classified: Secondary | ICD-10-CM | POA: Diagnosis not present

## 2013-12-26 DIAGNOSIS — Z7901 Long term (current) use of anticoagulants: Secondary | ICD-10-CM | POA: Diagnosis not present

## 2013-12-26 DIAGNOSIS — Z8679 Personal history of other diseases of the circulatory system: Secondary | ICD-10-CM | POA: Diagnosis not present

## 2013-12-31 DIAGNOSIS — M533 Sacrococcygeal disorders, not elsewhere classified: Secondary | ICD-10-CM | POA: Diagnosis not present

## 2014-01-08 DIAGNOSIS — R5383 Other fatigue: Secondary | ICD-10-CM | POA: Diagnosis not present

## 2014-01-08 DIAGNOSIS — R531 Weakness: Secondary | ICD-10-CM | POA: Diagnosis not present

## 2014-01-08 DIAGNOSIS — Z131 Encounter for screening for diabetes mellitus: Secondary | ICD-10-CM | POA: Diagnosis not present

## 2014-01-12 DIAGNOSIS — M1711 Unilateral primary osteoarthritis, right knee: Secondary | ICD-10-CM | POA: Diagnosis not present

## 2014-01-19 DIAGNOSIS — M1711 Unilateral primary osteoarthritis, right knee: Secondary | ICD-10-CM | POA: Diagnosis not present

## 2014-01-22 DIAGNOSIS — M542 Cervicalgia: Secondary | ICD-10-CM | POA: Diagnosis not present

## 2014-01-22 DIAGNOSIS — R55 Syncope and collapse: Secondary | ICD-10-CM | POA: Diagnosis not present

## 2014-01-22 DIAGNOSIS — G441 Vascular headache, not elsewhere classified: Secondary | ICD-10-CM | POA: Diagnosis not present

## 2014-01-22 DIAGNOSIS — M9901 Segmental and somatic dysfunction of cervical region: Secondary | ICD-10-CM | POA: Diagnosis not present

## 2014-01-22 DIAGNOSIS — E1122 Type 2 diabetes mellitus with diabetic chronic kidney disease: Secondary | ICD-10-CM | POA: Diagnosis not present

## 2014-01-22 DIAGNOSIS — E119 Type 2 diabetes mellitus without complications: Secondary | ICD-10-CM | POA: Diagnosis not present

## 2014-01-22 DIAGNOSIS — Z7901 Long term (current) use of anticoagulants: Secondary | ICD-10-CM | POA: Diagnosis not present

## 2014-01-22 DIAGNOSIS — I4891 Unspecified atrial fibrillation: Secondary | ICD-10-CM | POA: Diagnosis not present

## 2014-01-22 DIAGNOSIS — R001 Bradycardia, unspecified: Secondary | ICD-10-CM | POA: Diagnosis not present

## 2014-01-22 DIAGNOSIS — M5030 Other cervical disc degeneration, unspecified cervical region: Secondary | ICD-10-CM | POA: Diagnosis not present

## 2014-01-22 DIAGNOSIS — R399 Unspecified symptoms and signs involving the genitourinary system: Secondary | ICD-10-CM | POA: Diagnosis not present

## 2014-01-23 ENCOUNTER — Ambulatory Visit (INDEPENDENT_AMBULATORY_CARE_PROVIDER_SITE_OTHER): Payer: Medicare Other | Admitting: Cardiology

## 2014-01-23 ENCOUNTER — Encounter: Payer: Self-pay | Admitting: Cardiology

## 2014-01-23 VITALS — BP 120/60 | HR 68 | Ht 61.0 in | Wt 206.4 lb

## 2014-01-23 DIAGNOSIS — R0602 Shortness of breath: Secondary | ICD-10-CM | POA: Diagnosis not present

## 2014-01-23 DIAGNOSIS — I480 Paroxysmal atrial fibrillation: Secondary | ICD-10-CM | POA: Diagnosis not present

## 2014-01-23 DIAGNOSIS — I10 Essential (primary) hypertension: Secondary | ICD-10-CM | POA: Diagnosis not present

## 2014-01-23 DIAGNOSIS — R079 Chest pain, unspecified: Secondary | ICD-10-CM | POA: Diagnosis not present

## 2014-01-23 DIAGNOSIS — G4733 Obstructive sleep apnea (adult) (pediatric): Secondary | ICD-10-CM | POA: Diagnosis not present

## 2014-01-23 DIAGNOSIS — E669 Obesity, unspecified: Secondary | ICD-10-CM | POA: Diagnosis not present

## 2014-01-23 NOTE — Progress Notes (Signed)
[place], [place], [place]  [address] Phone: [phone] Fax:  [phone]  Date:  01/23/2014   ID:  Lynn Dunn, DOB 1930/04/16, MRN [id number]  PCP:  Abigail Miyamoto, MD  Sleep Medicine:  Fransico Him, MD    History of Present Illness: Lynn Dunn is a 80 y.o. female with a history of OSA on CPAP, obesity, PAF and HTN who presents today for followup. She is doing well. She tolerates her CPAP therapy well. She feels the pressure is adequate. She tolerates her nasal mask well and does not use a chin strap.She has no daytime sleepiness.   She exercises in silver sneakers.  She has been having episodes of PAF after her ablation and recently these have increased in frequency.  She says that she has had 6 episodes in the last month.  These episodes last up to 6 hours at a time and when she breaks out of them she gets a funny feeling in her chest that she says is an electrical shock but it is not painful.  As soon as she gets the shock she is normal rhythm.  After the episode she feels fatigued the rest of the day.  She also has noticed some chest pain with exertion for the past [redacted] weeks along with SOB.  She has chronic LE edema which is chronic.  She has been having some problems with her SBP dropping in the 90's after taking her am meds and makes her feel bad.   Wt Readings from Last 3 Encounters:  01/23/14 206 lb 6.4 oz (93.622 kg)  03/20/13 191 lb (86.637 kg)  12/27/12 193 lb 12.8 oz (87.907 kg)     Past Medical History  Diagnosis Date  . HTN (hypertension)   . PAF (paroxysmal atrial fibrillation)     s/p ablation 07/29/09  . COPD (chronic obstructive pulmonary disease)   . GERD (gastroesophageal reflux disease)   . Obesity   . Stroke 6/09  . Anginal pain 2011    had to take nitro   . OSA (obstructive sleep apnea)     cpap at home  . Heart murmur   . Anemia     Current Outpatient Prescriptions  Medication Sig Dispense Refill  . Cholecalciferol  (VITAMIN D PO) Take 1 capsule by mouth daily.    . Coenzyme Q10 (COQ10 PO) Take 1 capsule by mouth daily.    Marland Kitchen diltiazem (CARDIZEM SR) 120 MG 12 hr capsule Take 120 mg by mouth. Once a day    . furosemide (LASIX) 40 MG tablet Take 1 tablet (40 mg total) by mouth 2 (two) times daily as needed. 180 tablet 2  . KRILL OIL OMEGA-3 PO Take 2 capsules by mouth twice daily.    . metoprolol (LOPRESSOR) 25 MG tablet Take 1 tablet (25 mg total) by mouth 2 (two) times daily. 60 tablet 0  . potassium chloride (K-DUR) 10 MEQ tablet Take 1 tablet (10 mEq total) by mouth 2 (two) times daily. (Patient taking differently: Take 10 mEq by mouth once. ) 180 tablet 1  . ranitidine (ZANTAC) 150 MG capsule Take 150 mg by mouth as needed for heartburn.    . rosuvastatin (CRESTOR) 20 MG tablet 1 tablet daily. 90 tablet 1  . warfarin (COUMADIN) 3 MG tablet Take 3 mg by mouth daily.     No current facility-administered medications for this visit.    Allergies:    Allergies  Allergen Reactions  . Albuterol  Atrial fibrillation    Social History:  The patient  reports that she has never smoked. She does not have any smokeless tobacco history on file. She reports that she does not drink alcohol or use illicit drugs.   Family History:  The patient's family history includes Brain cancer in her father and another family member; Breast cancer in her mother and another family member.   ROS:  Please see the history of present illness.      All other systems reviewed and negative.   PHYSICAL EXAM: VS:  BP 120/60 mmHg  Pulse 68  Ht 5\' 1"  (1.549 m)  Wt 206 lb 6.4 oz (93.622 kg)  BMI 39.02 kg/m2 Well nourished, well developed, in no acute distress HEENT: normal Neck: no JVD Cardiac:  normal S1, S2; RRR; no murmur Lungs:  clear to auscultation bilaterally, no wheezing, rhonchi or rales Abd: soft, nontender, no hepatomegaly Ext: no edema Skin: warm and dry Neuro:  CNs 2-12 intact, no focal abnormalities  noted  EKG:  NSR with anterior infarct and no ST changes  ASSESSMENT AND PLAN:  1. OSA on CPAP and tolerating well - I will get a d/l from her DME 2. HTN - well controlled - continue metoprolol  - stop diltiazem since her BP has been dropping after taking it. I have asked her to check her BP when she gets a sinking spell and keep a diary of her BPs to bring to Dr. Irish Lack. 3. Obesity - continue in current exercise program and follow a low carb diet and watch her portions 4. Palpitations with history of PAF.  She says that she knows when she is going in and out of afib and then gets a shock feeling when she converts to NSR which leaves her wiped out the rest of the day.  Continue metoprolol//warfarin.  I will get a heart monitor to see how much afib she is having and see if she is having post termination pauses that are giving her the shocking feeling 5.  Exertional CP over the past 2 weeks associated with SOB.  Her EKG shows no ST changes.  I will get a Lexiscan myoview to assess for ischemia.  Followup with Dr. Irish Lack in 2 weeks to review findings of studies since he treats her PAF.    Followup with me in 6 months  Signed, Fransico Him, MD [facility] 01/23/2014 11:03 AM

## 2014-01-23 NOTE — Patient Instructions (Signed)
Your physician has recommended that you wear an event monitor. Event monitors are medical devices that record the heart's electrical activity. Doctors most often [place] these monitors to diagnose arrhythmias. Arrhythmias are problems with the speed or rhythm of the heartbeat. The monitor is a small, portable device. You can wear one while you do your normal daily activities. This is usually used to diagnose what is causing palpitations/syncope (passing out).  Your physician has recommended you make the following change in your medication:  1) STOP diltiazem  Your physician has requested that you have a lexiscan myoview. For further information please visit [email or web address]. Please follow instruction sheet, as given.  Your physician recommends that you schedule a follow-up appointment in: 2 weeks with Dr. Irish Lack.   Your physician wants you to follow-up in: 6 months with Dr. Radford Pax. You will receive a reminder letter in the mail two months in advance. If you don't receive a letter, please call our office to schedule the follow-up appointment.

## 2014-01-27 ENCOUNTER — Encounter: Payer: Self-pay | Admitting: *Deleted

## 2014-01-27 ENCOUNTER — Encounter (INDEPENDENT_AMBULATORY_CARE_PROVIDER_SITE_OTHER): Payer: Medicare Other

## 2014-01-27 ENCOUNTER — Ambulatory Visit (HOSPITAL_COMMUNITY): Payer: Medicare Other | Attending: Internal Medicine | Admitting: Radiology

## 2014-01-27 DIAGNOSIS — E785 Hyperlipidemia, unspecified: Secondary | ICD-10-CM | POA: Diagnosis not present

## 2014-01-27 DIAGNOSIS — R0602 Shortness of breath: Secondary | ICD-10-CM

## 2014-01-27 DIAGNOSIS — R002 Palpitations: Secondary | ICD-10-CM | POA: Diagnosis not present

## 2014-01-27 DIAGNOSIS — J449 Chronic obstructive pulmonary disease, unspecified: Secondary | ICD-10-CM | POA: Insufficient documentation

## 2014-01-27 DIAGNOSIS — I4891 Unspecified atrial fibrillation: Secondary | ICD-10-CM | POA: Insufficient documentation

## 2014-01-27 DIAGNOSIS — R079 Chest pain, unspecified: Secondary | ICD-10-CM | POA: Insufficient documentation

## 2014-01-27 DIAGNOSIS — I10 Essential (primary) hypertension: Secondary | ICD-10-CM | POA: Insufficient documentation

## 2014-01-27 DIAGNOSIS — I639 Cerebral infarction, unspecified: Secondary | ICD-10-CM | POA: Insufficient documentation

## 2014-01-27 DIAGNOSIS — I480 Paroxysmal atrial fibrillation: Secondary | ICD-10-CM

## 2014-01-27 MED ORDER — REGADENOSON 0.4 MG/5ML IV SOLN
0.4000 mg | Freq: Once | INTRAVENOUS | Status: AC
  Administered 2014-01-27: 0.4 mg via INTRAVENOUS

## 2014-01-27 MED ORDER — TECHNETIUM TC 99M SESTAMIBI GENERIC - CARDIOLITE
11.0000 | Freq: Once | INTRAVENOUS | Status: AC | PRN
  Administered 2014-01-27: 11 via INTRAVENOUS

## 2014-01-27 MED ORDER — TECHNETIUM TC 99M SESTAMIBI GENERIC - CARDIOLITE
33.0000 | Freq: Once | INTRAVENOUS | Status: AC | PRN
  Administered 2014-01-27: 33 via INTRAVENOUS

## 2014-01-27 NOTE — Progress Notes (Signed)
Patient ID: Lynn Dunn, female   DOB: 23-Nov-1930, 80 y.o.   MRN: [id number] [employer] 30 day cardiac event monitor applied to patient.

## 2014-01-27 NOTE — Progress Notes (Signed)
[facility] [address] [place], [place] [address] [phone]    Cardiology Nuclear Med Study  Lynn Dunn is a 80 y.o. female     MRN : [id number]     DOB: [birth year]  Procedure Date: 01/27/2014  Nuclear Med Background Indication for Stress Test:  Evaluation for Ischemia History:  MPI 2011 (normal) EF 64%, COPD, Afib Cardiac Risk Factors: CVA, Hypertension and Lipids  Symptoms:  Chest Pain (last date of chest discomfort was one week ago), Light-Headedness, Palpitations and SOB   Nuclear Pre-Procedure Caffeine/Decaff Intake:  None NPO After: 7:00pm   Lungs:  clear O2 Sat: 94% on room air. IV 0.9% NS with Angio Cath:  22g  IV Site: R Hand  IV Started by:  Matilde Haymaker, RN  Chest Size (in):  40 Cup Size: B  Height: 5\' 1"  (1.549 m)  Weight:  203 lb (92.08 kg)  BMI:  Body mass index is 38.38 kg/(m^2). Tech Comments:  No Lopressor x 14 hrs    Nuclear Med Study 1 or 2 day study: 1 day  Stress Test Type:  Lexiscan  Reading MD: n/a  Order Authorizing Provider:  Rowan Blase  Resting Radionuclide: Technetium 49m Sestamibi  Resting Radionuclide Dose: 11.0 mCi   Stress Radionuclide:  Technetium 53m Sestamibi  Stress Radionuclide Dose: 33.0 mCi           Stress Protocol Rest HR: 65 Stress HR: 83  Rest BP: 110/62 Stress BP: 97/36  Exercise Time (min): n/a METS: n/a   Predicted Max HR: 140 bpm % Max HR: 59.29 bpm Rate Pressure Product: 9711   Dose of Adenosine (mg):  n/a Dose of Lexiscan: 0.4 mg  Dose of Atropine (mg): n/a Dose of Dobutamine: n/a mcg/kg/min (at max HR)  Stress Test Technologist: Glade Lloyd, BS-ES  Nuclear Technologist:  Earl Many, CNMT     Rest Procedure:  Myocardial perfusion imaging was performed at rest 45 minutes following the intravenous administration of Technetium 69m Sestamibi. Rest ECG: NSR with poor R wave progression  Stress Procedure:  The patient received IV Lexiscan 0.4 mg over 15-seconds.   Technetium 51m Sestamibi injected at 30-seconds.  Quantitative spect images were obtained after a 45 minute delay.  During the infusion of Lexiscan the patient complained of chest tightness, SOB, lightheadedness and a headache.  These symptoms began to resolve in recovery.  Stress ECG: No significant change from baseline ECG  QPS Raw Data Images:  Normal; no motion artifact; normal heart/lung ratio. Stress Images:  Normal homogeneous uptake in all areas of the myocardium. Rest Images:  Normal homogeneous uptake in all areas of the myocardium. Subtraction (SDS):  No evidence of ischemia. Transient Ischemic Dilatation (Normal <1.22):  0.87 Lung/Heart Ratio (Normal <0.45):  0.29  Quantitative Gated Spect Images QGS EDV:  78 ml QGS ESV:  23 ml  Impression Exercise Capacity:  Lexiscan with no exercise. BP Response:  Normal blood pressure response. Clinical Symptoms:  Typical symptoms with Lexiscan ECG Impression:  No significant ST segment change suggestive of ischemia. Comparison with Prior Nuclear Study: No images to compare  Overall Impression:  Low risk stress nuclear study with no ischemia.  LV Ejection Fraction: 70%.  LV Wall Motion:  NL LV Function; NL Wall Motion  Candee Furbish, MD  .

## 2014-01-28 ENCOUNTER — Other Ambulatory Visit: Payer: Self-pay

## 2014-01-28 MED ORDER — FUROSEMIDE 40 MG PO TABS: 40.0000 mg | ORAL_TABLET | Freq: Two times a day (BID) | ORAL | Status: DC | PRN

## 2014-01-28 NOTE — Telephone Encounter (Signed)
furosemide (LASIX) 40 MG tablet Take 1 tablet (40 mg total) by mouth 2 (two) times daily as needed.   Your physician has recommended you make the following change in your medication:  1) STOP diltiazem  Sueanne Margarita, MD at 01/23/2014 9:15 AM

## 2014-02-03 DIAGNOSIS — M1711 Unilateral primary osteoarthritis, right knee: Secondary | ICD-10-CM | POA: Diagnosis not present

## 2014-02-04 ENCOUNTER — Telehealth: Payer: Self-pay | Admitting: *Deleted

## 2014-02-04 NOTE — Telephone Encounter (Signed)
[employer] called regarding afib@110  bpm. They will fax episode  Pt has hx of PAF and is on coumadin, I will forward to Dr Pincus Large as an Juluis Rainier and to advise.

## 2014-02-10 DIAGNOSIS — M1711 Unilateral primary osteoarthritis, right knee: Secondary | ICD-10-CM | POA: Diagnosis not present

## 2014-02-16 ENCOUNTER — Ambulatory Visit (INDEPENDENT_AMBULATORY_CARE_PROVIDER_SITE_OTHER): Payer: Medicare Other | Admitting: Physician Assistant

## 2014-02-16 ENCOUNTER — Encounter: Payer: Self-pay | Admitting: Physician Assistant

## 2014-02-16 VITALS — BP 100/60 | HR 68 | Ht 61.0 in | Wt 203.0 lb

## 2014-02-16 DIAGNOSIS — I10 Essential (primary) hypertension: Secondary | ICD-10-CM | POA: Diagnosis not present

## 2014-02-16 DIAGNOSIS — G4733 Obstructive sleep apnea (adult) (pediatric): Secondary | ICD-10-CM | POA: Diagnosis not present

## 2014-02-16 DIAGNOSIS — I480 Paroxysmal atrial fibrillation: Secondary | ICD-10-CM | POA: Diagnosis not present

## 2014-02-16 DIAGNOSIS — R079 Chest pain, unspecified: Secondary | ICD-10-CM | POA: Diagnosis not present

## 2014-02-16 DIAGNOSIS — E785 Hyperlipidemia, unspecified: Secondary | ICD-10-CM | POA: Diagnosis not present

## 2014-02-16 MED ORDER — METOPROLOL TARTRATE 25 MG PO TABS: 25.0000 mg | ORAL_TABLET | Freq: Two times a day (BID) | ORAL | Status: DC

## 2014-02-16 NOTE — Progress Notes (Signed)
Cardiology Office Note   Date:  02/16/2014   ID:  BERDINE MCCLARAN, DOB [birth year], MRN [id number]  PCP:  Abigail Miyamoto, MD  Cardiologist:  Dr. Casandra Doffing   Electrophysiologist:  Dr. Thompson Grayer Sleep medicine:  Dr. Fransico Him     History of Present Illness: Lynn Dunn is a 80 y.o. female  with a hx of PAF s/p PVI ablation 07/2009, HTN, HL, COPD, sleep apnea, prior CVA. obesity.  She had recurrent AFib with RVR in 05/2012.  She spontaneously converted to NSR in the ED.  She recently saw Dr. Radford Pax in FU and noted increasing frequency of PAF.  Patient reported an "electric shock" feeling in her chest when she converts to NSR.  She also noted chest pain with exertion with associated dyspnea, LE edema and occasional BP dropping into the 0000000 systolic.  Diltiazem was stopped.  Event monitor was placed.  This is still pending but there was one episode of AFib with RVR recorded for several hours 02/04/14.  Lexiscan Myoview was obtained and demonstrated no ischemia.  She returns for FU.  She has not had any further "electric shock" feelings.  She tells me that these would occur when she felt that she went from AFib to NSR.  She fell to the ground with these but denies syncope or near syncope.  She denies exertional chest pain.  She has chronic DOE.  She is NYHA 2-2b.  She denies any worsening symptoms.  She sleeps on 2 pillows.  No PND.  She has stable ankle edema.   Studies:   - R/L HC (2/09):  No significant CAD, normal right heart pressures (RA 8/5 mean for, RV 32/3 RVEDP 6 mmHg, PAP 31/11 mean 19, PCWP 16/13 mean 11, CO 3.5, CI 1.9), EF 55%  - Echo (7/13):  Mild LVH, normal wall motion, EF 55-60%, mild RVE, mild LAE, mild RAE, moderate MAC, mild MR, mild TR, RVSP 40-45 mmHg, mild AS (mean 8 mmHg), mild to moderate AI, diastolic dysfunction  - Nuclear (11/15):  No ischemia, EF 70%, low risk   Recent Labs: No results found for requested labs within last 365 days.    Recent  Radiology: No results found.    Wt Readings from Last 3 Encounters:  02/16/14 203 lb (92.08 kg)  01/27/14 203 lb (92.08 kg)  01/23/14 206 lb 6.4 oz (93.622 kg)     Past Medical History  Diagnosis Date  . HTN (hypertension)   . PAF (paroxysmal atrial fibrillation)     s/p ablation 07/29/09  . COPD (chronic obstructive pulmonary disease)   . GERD (gastroesophageal reflux disease)   . Obesity   . Stroke 6/09  . Anginal pain 2011    had to take nitro   . OSA (obstructive sleep apnea)     cpap at home  . Heart murmur   . Anemia     Current Outpatient Prescriptions  Medication Sig Dispense Refill  . Cholecalciferol (VITAMIN D PO) Take 1 capsule by mouth daily.    . Coenzyme Q10 (COQ10 PO) Take 1 capsule by mouth daily.    . furosemide (LASIX) 40 MG tablet Take 1 tablet (40 mg total) by mouth 2 (two) times daily as needed. 60 tablet 0  . KRILL OIL OMEGA-3 PO Take 2 capsules by mouth twice daily.    . Magnesium 100 MG CAPS Take 1 capsule by mouth as needed.     . metoprolol (LOPRESSOR) 25 MG tablet Take 1 tablet (  25 mg total) by mouth 2 (two) times daily. (Patient taking differently: Take 25 mg by mouth 2 (two) times daily. Pt. Has been taking 50 mg twice a day) 60 tablet 0  . metoprolol (LOPRESSOR) 50 MG tablet Take 50 mg by mouth 2 (two) times daily.     . potassium chloride (K-DUR) 10 MEQ tablet Take 1 tablet (10 mEq total) by mouth 2 (two) times daily. (Patient taking differently: Take 10 mEq by mouth once. ) 180 tablet 1  . ranitidine (ZANTAC) 150 MG capsule Take 150 mg by mouth as needed for heartburn.    . rosuvastatin (CRESTOR) 20 MG tablet 1 tablet daily. 90 tablet 1  . warfarin (COUMADIN) 3 MG tablet Take 3 mg by mouth daily.     No current facility-administered medications for this visit.     Allergies:   Albuterol   Social History:  The patient  reports that she has never smoked. She does not have any smokeless tobacco history on file. She reports that she does not  drink alcohol or use illicit drugs.   Family History:  The patient's family history includes Brain cancer in her father and another family member; Breast cancer in her mother and another family member; Hypertension in her sister; Stroke in her maternal aunt. There is no history of Heart attack.    ROS:  Please see the history of present illness.       All other systems reviewed and negative.    PHYSICAL EXAM: VS:  BP 100/60 mmHg  Pulse 68  Ht 5\' 1"  (1.549 m)  Wt 203 lb (92.08 kg)  BMI 38.38 kg/m2 Well nourished, well developed, in no acute distress HEENT: normal Neck: no JVD Cardiac:  normal S1, S2;  RRR; no murmur   Lungs:   clear to auscultation bilaterally, no wheezing, rhonchi or rales Abd: soft, nontender, no hepatomegaly Ext: trace bilateral ankle edema Skin: warm and dry Neuro:  CNs 2-12 intact, no focal abnormalities noted  EKG:  NSR, HR 68, normal axis, PRWP, PR 206 ms      ASSESSMENT AND PLAN:   1.  Paroxysmal Atrial Fibrillation:  She is in NSR today.  Review of recent event monitor demonstrates at least one episode of AF with RVR.  She still has 1 1/2 weeks left.  I reviewed her monitor today with Dr. Casandra Doffing.  Options include Flecainide or Tikosyn.  We feel that she should FU with Dr. Thompson Grayer to determine the best course of action.  Will arrange FU in the AFib clinic.  I will also obtain an echo to reassess atrial size. 2.  Chest Pain:  No recurrence.  Recent Myoview low risk.  Symptoms appear to be related to AFib.  3.  Hypertension:  She notes a symptomatic drop in her BP at times.  Decrease Metoprolol Tartrate to 25 mg bid.  She can take an extra Metoprolol 25 mg if she has rapid palpitations.  4.  Sleep Apnea:  FU with Dr. Fransico Him as planned.  5.  Hyperlipidemia:  Continue statin.   Disposition:   FU with Dr. Thompson Grayer in the AFib clinic 3 weeks.   Signed, Versie Starks, MHS 02/16/2014 10:49 AM    [facility] [place], [place], [place]  [address] Phone: [phone]; Fax: [phone]

## 2014-02-16 NOTE — Patient Instructions (Signed)
Your physician has recommended you make the following change in your medication:  1. GO BACK TO METOPROLOL 25 MG 1 TABLET TWICE DAILY; OK TO TAKE EXTRA 25 MG IF YOUR FEEL YOUR HEART IS OUT OF RHYTHM   FOLLOW UP WITH DR. ALLRED IN THE A-FIB CLINIC IN 3 WEEKS  Your physician has requested that you have an echocardiogram DX A-FIB, CHEST PAIN. Echocardiography is a painless test that uses sound waves to create images of your heart. It provides your doctor with information about the size and shape of your heart and how well your heart's chambers and valves are working. This procedure takes approximately one hour. There are no restrictions for this procedure.

## 2014-02-16 NOTE — Addendum Note (Signed)
Addended by: Michae Kava on: 02/16/2014 11:38 AM   Modules accepted: Orders

## 2014-02-16 NOTE — Addendum Note (Signed)
Addended by: Michae Kava on: 02/16/2014 12:44 PM   Modules accepted: Orders

## 2014-02-19 DIAGNOSIS — M1711 Unilateral primary osteoarthritis, right knee: Secondary | ICD-10-CM | POA: Diagnosis not present

## 2014-02-19 DIAGNOSIS — I4891 Unspecified atrial fibrillation: Secondary | ICD-10-CM | POA: Diagnosis not present

## 2014-02-19 DIAGNOSIS — Z7901 Long term (current) use of anticoagulants: Secondary | ICD-10-CM | POA: Diagnosis not present

## 2014-02-20 ENCOUNTER — Other Ambulatory Visit: Payer: Self-pay

## 2014-02-20 DIAGNOSIS — I10 Essential (primary) hypertension: Secondary | ICD-10-CM

## 2014-02-20 DIAGNOSIS — I480 Paroxysmal atrial fibrillation: Secondary | ICD-10-CM

## 2014-02-20 MED ORDER — METOPROLOL TARTRATE 25 MG PO TABS: 25.0000 mg | ORAL_TABLET | Freq: Two times a day (BID) | ORAL | Status: DC

## 2014-02-23 ENCOUNTER — Ambulatory Visit (HOSPITAL_COMMUNITY): Payer: Medicare Other | Attending: Cardiovascular Disease | Admitting: Radiology

## 2014-02-23 ENCOUNTER — Encounter: Payer: Self-pay | Admitting: Physician Assistant

## 2014-02-23 ENCOUNTER — Telehealth: Payer: Self-pay | Admitting: Interventional Cardiology

## 2014-02-23 DIAGNOSIS — I340 Nonrheumatic mitral (valve) insufficiency: Secondary | ICD-10-CM | POA: Insufficient documentation

## 2014-02-23 DIAGNOSIS — E785 Hyperlipidemia, unspecified: Secondary | ICD-10-CM | POA: Diagnosis not present

## 2014-02-23 DIAGNOSIS — I4891 Unspecified atrial fibrillation: Secondary | ICD-10-CM | POA: Diagnosis not present

## 2014-02-23 DIAGNOSIS — J449 Chronic obstructive pulmonary disease, unspecified: Secondary | ICD-10-CM | POA: Insufficient documentation

## 2014-02-23 DIAGNOSIS — I480 Paroxysmal atrial fibrillation: Secondary | ICD-10-CM

## 2014-02-23 DIAGNOSIS — R079 Chest pain, unspecified: Secondary | ICD-10-CM | POA: Insufficient documentation

## 2014-02-23 DIAGNOSIS — I10 Essential (primary) hypertension: Secondary | ICD-10-CM | POA: Diagnosis not present

## 2014-02-23 DIAGNOSIS — Z8673 Personal history of transient ischemic attack (TIA), and cerebral infarction without residual deficits: Secondary | ICD-10-CM | POA: Insufficient documentation

## 2014-02-23 NOTE — Progress Notes (Signed)
Echocardiogram performed.  

## 2014-02-23 NOTE — Telephone Encounter (Signed)
Pt notified about echo results with verbal understanding and was advised to keep her appt with Dr. Rayann Heman 1/22. Pt asked to be put on a bump list to see if she can come in sooner.

## 2014-02-23 NOTE — Telephone Encounter (Signed)
New Message        Patient returning call and would like a call back . Thanks

## 2014-02-24 ENCOUNTER — Other Ambulatory Visit: Payer: Self-pay | Admitting: *Deleted

## 2014-02-24 MED ORDER — FUROSEMIDE 40 MG PO TABS: 40.0000 mg | ORAL_TABLET | Freq: Two times a day (BID) | ORAL | Status: DC | PRN

## 2014-03-02 DIAGNOSIS — M5030 Other cervical disc degeneration, unspecified cervical region: Secondary | ICD-10-CM | POA: Diagnosis not present

## 2014-03-02 DIAGNOSIS — G441 Vascular headache, not elsewhere classified: Secondary | ICD-10-CM | POA: Diagnosis not present

## 2014-03-02 DIAGNOSIS — M542 Cervicalgia: Secondary | ICD-10-CM | POA: Diagnosis not present

## 2014-03-02 DIAGNOSIS — M9901 Segmental and somatic dysfunction of cervical region: Secondary | ICD-10-CM | POA: Diagnosis not present

## 2014-03-03 ENCOUNTER — Telehealth: Payer: Self-pay | Admitting: Cardiology

## 2014-03-03 NOTE — Telephone Encounter (Signed)
Please let patient know that heart monitor showed NSR with several episodes of atrial fibrillation with RVR.  She is on Warfarin for known PAF in the past.  Please see if appt with Dr. Rayann Heman can be moved up since she is symptomatic.

## 2014-03-04 NOTE — Telephone Encounter (Signed)
Patient informed of monitor results and verbal understanding expressed.  Message sent to [facility] for scheduling.

## 2014-03-11 ENCOUNTER — Ambulatory Visit (INDEPENDENT_AMBULATORY_CARE_PROVIDER_SITE_OTHER): Payer: Medicare Other | Admitting: Nurse Practitioner

## 2014-03-11 ENCOUNTER — Other Ambulatory Visit: Payer: Self-pay | Admitting: *Deleted

## 2014-03-11 ENCOUNTER — Encounter: Payer: Self-pay | Admitting: Nurse Practitioner

## 2014-03-11 VITALS — BP 130/60 | HR 71 | Ht 61.0 in | Wt 204.0 lb

## 2014-03-11 DIAGNOSIS — I480 Paroxysmal atrial fibrillation: Secondary | ICD-10-CM

## 2014-03-11 NOTE — Progress Notes (Signed)
PCP: Abigail Miyamoto, MD Primary Cardiologist:  Dr Irish Lack   Lynn Dunn is here today for evaluation of presyncopal episodes with h/o afib. She is s/p ablation in 2011. She had been in her usual state of health until October when she noticed several episodes of an "electric shock " to her heart, making her feel as though she would pass out, lasting several seconds. She was not aware of any afib at the time, but wondered if she was coming out of afib to SR. The episode that concerned her the most was when she was in the parking lot of a grocery store and almost went to her knees. Several bystanders caught her, she recovered enough to continue to do her grocery shopping but took several hours to get back to her baseline.  She was seen in the clinic, also mentioned that bp was low at times with sys of 90, so Cardizem was stopped and echo was obtained. Normal EF with mild LVH and Mod LA size. A nuclear stress test was done in November and was normal.  She also wore an event monitor which did not show any pauses, some symptoms were associated with AF, but similar symptoms were also noted with SR. AFib burden during time worn was 1%. Pt states she has afib once a month for 1-2 hours which correlated with monitor. She has had no further weak spells since October. She continues on warfarin without bleeding issues. She is wearing her cpap.  Today, she has no complaints of exertional chest pain, dyspnea, PND, orthopnea, pedal edema, abdominal complaints.      Past Medical History  Diagnosis Date  . HTN (hypertension)   . PAF (paroxysmal atrial fibrillation)     s/p ablation 07/29/09  . COPD (chronic obstructive pulmonary disease)   . GERD (gastroesophageal reflux disease)   . Obesity   . Stroke 6/09  . Anginal pain 2011    had to take nitro   . OSA (obstructive sleep apnea)     cpap at home  . Heart murmur   . Anemia   . Hx of echocardiogram     Echo (12/15):  Mild LVH, EF 60-65%, mild  MR, mod LAE (LA 49 mm), PASP 43 mmHg   Past Surgical History  Procedure Laterality Date  . Atrial ablation surgery      s/p ablation for afib 07/29/09  . Abdominal hysterectomy    . Esophagogastroduodenoscopy  01/24/2012    Procedure: ESOPHAGOGASTRODUODENOSCOPY (EGD);  Surgeon: Jeryl Columbia, MD;  Location: [facility] ENDOSCOPY;  Service: Endoscopy;  Laterality: N/A;    Current Outpatient Prescriptions  Medication Sig Dispense Refill  . Cholecalciferol (VITAMIN D PO) Take 1 capsule by mouth daily.    . Coenzyme Q10 (COQ10 PO) Take 1 capsule by mouth daily.    . furosemide (LASIX) 40 MG tablet Take 1 tablet (40 mg total) by mouth 2 (two) times daily as needed. 180 tablet 1  . KRILL OIL OMEGA-3 PO Take 2 capsules by mouth twice daily.    . Magnesium 100 MG CAPS Take 1 capsule by mouth as needed.     . metoprolol tartrate (LOPRESSOR) 25 MG tablet Take 1 tablet (25 mg total) by mouth 2 (two) times daily. Ok to take one extra 25 mg daily  if you feel your heart is out of rhythm 190 tablet 3  . potassium chloride (K-DUR) 10 MEQ tablet Take 1 tablet (10 mEq total) by mouth 2 (two) times daily. (  Patient taking differently: Take 10 mEq by mouth once. ) 180 tablet 1  . ranitidine (ZANTAC) 150 MG capsule Take 150 mg by mouth as needed for heartburn.    . rosuvastatin (CRESTOR) 20 MG tablet 1 tablet daily. 90 tablet 1  . warfarin (COUMADIN) 3 MG tablet Take 3 mg by mouth daily.     No current facility-administered medications for this visit.    Allergies  Allergen Reactions  . Albuterol Palpitations    Atrial fibrillation    History   Social History  . Marital Status: Married    Spouse Name: N/A    Number of Children: N/A  . Years of Education: N/A   Occupational History  . Not on file.   Social History Main Topics  . Smoking status: Never Smoker   . Smokeless tobacco: Not on file  . Alcohol Use: No  . Drug Use: No  . Sexual Activity: Not on file   Other Topics Concern  . Not on file    Social History Narrative    Family History  Problem Relation Age of Onset  . Breast cancer    . Brain cancer    . Breast cancer Mother   . Brain cancer Father   . Heart attack Neg Hx   . Stroke Maternal Aunt   . Hypertension Sister    Physical Exam: Filed Vitals:   03/11/14 0844  BP: 130/60  Pulse: 71  Height: 5\' 1"  (1.549 m)  Weight: 204 lb (92.534 kg)    GEN- The patient is well appearing, alert and oriented x 3 today.   Head- normocephalic, atraumatic Eyes-  Sclera clear, conjunctiva pink Ears- hearing intact Oropharynx- clear Neck- supple, no JVP Lymph- no cervical lymphadenopathy Lungs- Clear to ausculation bilaterally, normal work of breathing Heart- Regular rate and rhythm, no murmurs, rubs or gallops, PMI not laterally displaced GI- soft, NT, ND, + BS Extremities- no clubbing, cyanosis, or edema  Epic records reviewed including echo, stress test and 30 day [employer] monitor.  EKG reveals NSR with 1st degree block, anterolateral infarct, age undetermined.  Assessment and Plan:  1. Presyncopal episodes Unclear etiology,arrythmia vrs other causes. Discussed with Dr. Rayann Heman, agrees that  patient should undergo LinQ implantation to further determine etiology for symptoms. Patient is in agreement and research nurse  discussed with patient RIO trial and randomized to  hospital with plans for implantation  tomorrow.  2.PAF Burden by pt report and [employer] monitor appear to be low  around 1%. LinQ will also help determine burden, frequency of episodes/rate and will drive further decisions re need for AAD. Continue metoprolol and warfarin today.   3. OSA Continue CPAP  4. HTN Stable May have had drops in BP contributing to symptoms since no further episodes since cardizem d/c.   F/u will be with Dr. Rayann Heman after linq implantation.

## 2014-03-11 NOTE — Patient Instructions (Signed)
Your physician recommends that you continue on your current medications as directed. Please refer to the Current Medication list given to you today.  Tomorrow 03/12/2014 arrive at [facility] at the [facility] at 6:00 am for your procedure.

## 2014-03-11 NOTE — Progress Notes (Signed)
RIO 2 Informed Consent  Subject Name: Lynn Dunn  Subject met inclusion and exclusion criteria. The informed consent form, study requirements and expectations were reviewed with the subject and questions and concerns were addressed prior to the signing of the consent form. The subject verbalized understanding of the trail requirements. The subject agreed to participate in the RIO 2 trial and signed the informed consent. The informed consent was obtained prior to performance of any protocol-specific procedures for the subject. A copy of the signed informed consent was given to the subject and a copy was placed in the subject's medical record.   [name]  03/11/2014 @ 10:05am

## 2014-03-12 ENCOUNTER — Encounter (HOSPITAL_COMMUNITY)
Admission: RE | Disposition: A | Discharge status: Home / Self Care | Payer: Self-pay | Source: Ambulatory Visit | Attending: Internal Medicine

## 2014-03-12 ENCOUNTER — Encounter (HOSPITAL_COMMUNITY): Payer: Self-pay | Admitting: Internal Medicine

## 2014-03-12 ENCOUNTER — Ambulatory Visit (HOSPITAL_COMMUNITY)
Admission: RE | Admit: 2014-03-12 | Discharge: 2014-03-12 | Disposition: A | Discharge status: Home / Self Care | Payer: Medicare Other | Source: Ambulatory Visit | Attending: Internal Medicine | Admitting: Internal Medicine

## 2014-03-12 DIAGNOSIS — J449 Chronic obstructive pulmonary disease, unspecified: Secondary | ICD-10-CM | POA: Diagnosis not present

## 2014-03-12 DIAGNOSIS — I4891 Unspecified atrial fibrillation: Secondary | ICD-10-CM | POA: Diagnosis not present

## 2014-03-12 DIAGNOSIS — E669 Obesity, unspecified: Secondary | ICD-10-CM | POA: Insufficient documentation

## 2014-03-12 DIAGNOSIS — K219 Gastro-esophageal reflux disease without esophagitis: Secondary | ICD-10-CM | POA: Diagnosis not present

## 2014-03-12 DIAGNOSIS — Z8673 Personal history of transient ischemic attack (TIA), and cerebral infarction without residual deficits: Secondary | ICD-10-CM | POA: Diagnosis not present

## 2014-03-12 DIAGNOSIS — I10 Essential (primary) hypertension: Secondary | ICD-10-CM | POA: Insufficient documentation

## 2014-03-12 DIAGNOSIS — G4733 Obstructive sleep apnea (adult) (pediatric): Secondary | ICD-10-CM | POA: Diagnosis not present

## 2014-03-12 DIAGNOSIS — Z888 Allergy status to other drugs, medicaments and biological substances status: Secondary | ICD-10-CM | POA: Diagnosis not present

## 2014-03-12 HISTORY — PX: LOOP RECORDER IMPLANT: SHX5477

## 2014-03-12 SURGERY — LOOP RECORDER IMPLANT
Anesthesia: LOCAL

## 2014-03-12 MED ORDER — LIDOCAINE-EPINEPHRINE 1 %-1:100000 IJ SOLN
INTRAMUSCULAR | Status: AC
  Filled 2014-03-12: qty 1

## 2014-03-12 NOTE — Interval H&P Note (Signed)
History and Physical Interval Note:  03/12/2014 7:35 AM  Lynn Dunn  has presented today for surgery, with the diagnosis of afib/cva.  She has frequent palpitations and has had episodes of presyncope.  She is s/p afib ablation.  For better afib management and also to characterize her symptoms, I agree that implantable loop recorder implant is necessary.  She has previously worn event monitors.    The various methods of treatment have been discussed with the patient and family. After consideration of risks, benefits and other options for treatment, the patient has consented to  Procedure(s): LOOP RECORDER IMPLANT (N/A) as a surgical intervention .  The patient's history has been reviewed, patient examined, no change in status, stable for surgery.  I have reviewed the patient's chart and labs.  Questions were answered to the patient's satisfaction.     Thompson Grayer

## 2014-03-12 NOTE — H&P (View-Only) (Signed)
PCP: Lynn Miyamoto, MD Primary Cardiologist:  Dr Irish Lack   Lynn Dunn is here today for evaluation of presyncopal episodes with h/o afib. She is s/p ablation in 2011. She had been in her usual state of health until October when she noticed several episodes of an "electric shock " to her heart, making her feel as though she would pass out, lasting several seconds. She was not aware of any afib at the time, but wondered if she was coming out of afib to SR. The episode that concerned her the most was when she was in the parking lot of a grocery store and almost went to her knees. Several bystanders caught her, she recovered enough to continue to do her grocery shopping but took several hours to get back to her baseline.  She was seen in the clinic, also mentioned that bp was low at times with sys of 90, so Cardizem was stopped and echo was obtained. Normal EF with mild LVH and Mod LA size. A nuclear stress test was done in November and was normal.  She also wore an event monitor which did not show any pauses, some symptoms were associated with AF, but similar symptoms were also noted with SR. AFib burden during time worn was 1%. Pt states she has afib once a month for 1-2 hours which correlated with monitor. She has had no further weak spells since October. She continues on warfarin without bleeding issues. She is wearing her cpap.  Today, she has no complaints of exertional chest pain, dyspnea, PND, orthopnea, pedal edema, abdominal complaints.      Past Medical History  Diagnosis Date  . HTN (hypertension)   . PAF (paroxysmal atrial fibrillation)     s/p ablation 07/29/09  . COPD (chronic obstructive pulmonary disease)   . GERD (gastroesophageal reflux disease)   . Obesity   . Stroke 6/09  . Anginal pain 2011    had to take nitro   . OSA (obstructive sleep apnea)     cpap at home  . Heart murmur   . Anemia   . Hx of echocardiogram     Echo (12/15):  Mild LVH, EF 60-65%, mild  MR, mod LAE (LA 49 mm), PASP 43 mmHg   Past Surgical History  Procedure Laterality Date  . Atrial ablation surgery      s/p ablation for afib 07/29/09  . Abdominal hysterectomy    . Esophagogastroduodenoscopy  01/24/2012    Procedure: ESOPHAGOGASTRODUODENOSCOPY (EGD);  Surgeon: Jeryl Columbia, MD;  Location: [facility] ENDOSCOPY;  Service: Endoscopy;  Laterality: N/A;    Current Outpatient Prescriptions  Medication Sig Dispense Refill  . Cholecalciferol (VITAMIN D PO) Take 1 capsule by mouth daily.    . Coenzyme Q10 (COQ10 PO) Take 1 capsule by mouth daily.    . furosemide (LASIX) 40 MG tablet Take 1 tablet (40 mg total) by mouth 2 (two) times daily as needed. 180 tablet 1  . KRILL OIL OMEGA-3 PO Take 2 capsules by mouth twice daily.    . Magnesium 100 MG CAPS Take 1 capsule by mouth as needed.     . metoprolol tartrate (LOPRESSOR) 25 MG tablet Take 1 tablet (25 mg total) by mouth 2 (two) times daily. Ok to take one extra 25 mg daily  if you feel your heart is out of rhythm 190 tablet 3  . potassium chloride (K-DUR) 10 MEQ tablet Take 1 tablet (10 mEq total) by mouth 2 (two) times daily. (  Patient taking differently: Take 10 mEq by mouth once. ) 180 tablet 1  . ranitidine (ZANTAC) 150 MG capsule Take 150 mg by mouth as needed for heartburn.    . rosuvastatin (CRESTOR) 20 MG tablet 1 tablet daily. 90 tablet 1  . warfarin (COUMADIN) 3 MG tablet Take 3 mg by mouth daily.     No current facility-administered medications for this visit.    Allergies  Allergen Reactions  . Albuterol Palpitations    Atrial fibrillation    History   Social History  . Marital Status: Married    Spouse Name: N/A    Number of Children: N/A  . Years of Education: N/A   Occupational History  . Not on file.   Social History Main Topics  . Smoking status: Never Smoker   . Smokeless tobacco: Not on file  . Alcohol Use: No  . Drug Use: No  . Sexual Activity: Not on file   Other Topics Concern  . Not on file    Social History Narrative    Family History  Problem Relation Age of Onset  . Breast cancer    . Brain cancer    . Breast cancer Mother   . Brain cancer Father   . Heart attack Neg Hx   . Stroke Maternal Aunt   . Hypertension Sister    Physical Exam: Filed Vitals:   03/11/14 0844  BP: 130/60  Pulse: 71  Height: 5\' 1"  (1.549 m)  Weight: 204 lb (92.534 kg)    GEN- The patient is well appearing, alert and oriented x 3 today.   Head- normocephalic, atraumatic Eyes-  Sclera clear, conjunctiva pink Ears- hearing intact Oropharynx- clear Neck- supple, no JVP Lymph- no cervical lymphadenopathy Lungs- Clear to ausculation bilaterally, normal work of breathing Heart- Regular rate and rhythm, no murmurs, rubs or gallops, PMI not laterally displaced GI- soft, NT, ND, + BS Extremities- no clubbing, cyanosis, or edema  Epic records reviewed including echo, stress test and 30 day lifewatch monitor.  EKG reveals NSR with 1st degree block, anterolateral infarct, age undetermined.  Assessment and Plan:  1. Presyncopal episodes Unclear etiology,arrythmia vrs other causes. Discussed with Dr. Rayann Heman, agrees that  patient should undergo LinQ implantation to further determine etiology for symptoms. Patient is in agreement and research nurse  discussed with patient RIO trial and randomized to  hospital with plans for implantation  tomorrow.  2.PAF Burden by pt report and lifewatch monitor appear to be low  around 1%. LinQ will also help determine burden, frequency of episodes/rate and will drive further decisions re need for AAD. Continue metoprolol and warfarin today.   3. OSA Continue CPAP  4. HTN Stable May have had drops in BP contributing to symptoms since no further episodes since cardizem d/c.   F/u will be with Dr. Rayann Heman after linq implantation.

## 2014-03-12 NOTE — Op Note (Signed)
SURGEON:  Thompson Grayer, MD     PREPROCEDURE DIAGNOSIS:  Atrial fibrillation, palpitations, presyncope    POSTPROCEDURE DIAGNOSIS:  Atrial fibrillation, palpitations, presyncope     PROCEDURES:   1. Implantable loop recorder implantation    INTRODUCTION:  Lynn Dunn is a 81 y.o. female with a history of atrial fibrillation s/p ablation, palpitations and presyncope who presents today for implantable loop implantation.  She has had recurrent symptoms of palpitations post ablation.  In addition, she has had symptoms of recurrent presyncope.  She has previously worn telemetry monitoring.  She is felt to require ILR implant for further characterization of her symptoms.   The patient therefore presents today for implantable loop implantation.     DESCRIPTION OF PROCEDURE:  Informed written consent was obtained, and the patient was brought to the electrophysiology lab in a fasting state.  The patient required no sedation for the procedure today.  Mapping over the patient's chest was performed by the EP lab staff to identify the area where electrograms were most prominent for ILR recording.  This area was found to be the left parasternal region over the 3rd-4th intercostal space. The patients left chest was therefore prepped and draped in the usual sterile fashion by the EP lab staff. The skin overlying the left parasternal region was infiltrated with lidocaine for local analgesia.  A 0.5-cm incision was made over the left parasternal region over the 3rd intercostal space.  A subcutaneous ILR pocket was fashioned using a combination of sharp and blunt dissection.  A Medtronic Reveal [place] model [id number] implantable loop recorder was then placed into the pocket  R waves were very prominent and measured 0.21 mV. EBL<1 ml.  Steri- Strips and a sterile dressing were then applied.  There were no early apparent complications.     CONCLUSIONS:   1. Successful implantation of a Medtronic Reveal LINQ  implantable loop recorder for afib management post ablation  2. No early apparent complications.

## 2014-03-19 DIAGNOSIS — Z7901 Long term (current) use of anticoagulants: Secondary | ICD-10-CM | POA: Diagnosis not present

## 2014-03-19 DIAGNOSIS — I4891 Unspecified atrial fibrillation: Secondary | ICD-10-CM | POA: Diagnosis not present

## 2014-03-23 ENCOUNTER — Ambulatory Visit (INDEPENDENT_AMBULATORY_CARE_PROVIDER_SITE_OTHER): Payer: Medicare Other | Admitting: *Deleted

## 2014-03-23 DIAGNOSIS — I480 Paroxysmal atrial fibrillation: Secondary | ICD-10-CM

## 2014-03-23 DIAGNOSIS — M1711 Unilateral primary osteoarthritis, right knee: Secondary | ICD-10-CM | POA: Diagnosis not present

## 2014-03-23 LAB — MDC_IDC_ENUM_SESS_TYPE_INCLINIC
Date Time Interrogation Session: 20160118110027
MDC IDC SET ZONE DETECTION INTERVAL: 400 ms
Zone Setting Detection Interval: 2000 ms
Zone Setting Detection Interval: 3000 ms

## 2014-03-23 NOTE — Progress Notes (Signed)
Wound check in clinic s/p ILR implant. Wound well healed without redness or edema. Device function appears normal. Pt with 0 tachy episodes; 0 brady episodes; 0 asystole; 0 AF episodes; 0 symptom episodes.  Plan to follow up via [facility] and with JA on 4-20 @ 9:30am.

## 2014-03-27 ENCOUNTER — Ambulatory Visit: Payer: Medicare Other | Admitting: Internal Medicine

## 2014-04-03 DIAGNOSIS — G441 Vascular headache, not elsewhere classified: Secondary | ICD-10-CM | POA: Diagnosis not present

## 2014-04-03 DIAGNOSIS — M9901 Segmental and somatic dysfunction of cervical region: Secondary | ICD-10-CM | POA: Diagnosis not present

## 2014-04-03 DIAGNOSIS — M5030 Other cervical disc degeneration, unspecified cervical region: Secondary | ICD-10-CM | POA: Diagnosis not present

## 2014-04-03 DIAGNOSIS — M542 Cervicalgia: Secondary | ICD-10-CM | POA: Diagnosis not present

## 2014-04-06 ENCOUNTER — Encounter: Payer: Self-pay | Admitting: Internal Medicine

## 2014-04-09 ENCOUNTER — Telehealth: Payer: Self-pay | Admitting: Cardiology

## 2014-04-09 DIAGNOSIS — G4733 Obstructive sleep apnea (adult) (pediatric): Secondary | ICD-10-CM

## 2014-04-09 NOTE — Telephone Encounter (Signed)
New message   Pt called to states her CPAP machine 79 years old.. no repairs because no parts are sold anymore... will need a new machine. Please send a script. Please call

## 2014-04-10 ENCOUNTER — Ambulatory Visit (INDEPENDENT_AMBULATORY_CARE_PROVIDER_SITE_OTHER): Payer: Medicare Other | Admitting: *Deleted

## 2014-04-10 DIAGNOSIS — I480 Paroxysmal atrial fibrillation: Secondary | ICD-10-CM

## 2014-04-13 NOTE — Telephone Encounter (Signed)
Patient st she wants new CPAP and supplies from [facility].  New order placed. [place] notified.

## 2014-04-13 NOTE — Progress Notes (Signed)
Loop recorder 

## 2014-04-13 NOTE — Telephone Encounter (Signed)
Left message to call back  

## 2014-04-16 ENCOUNTER — Encounter: Payer: Self-pay | Admitting: Internal Medicine

## 2014-04-16 ENCOUNTER — Ambulatory Visit: Payer: Medicare Other | Admitting: *Deleted

## 2014-04-16 DIAGNOSIS — Z006 Encounter for examination for normal comparison and control in clinical research program: Secondary | ICD-10-CM

## 2014-04-16 DIAGNOSIS — I4891 Unspecified atrial fibrillation: Secondary | ICD-10-CM | POA: Diagnosis not present

## 2014-04-16 DIAGNOSIS — Z7901 Long term (current) use of anticoagulants: Secondary | ICD-10-CM | POA: Diagnosis not present

## 2014-04-16 NOTE — Progress Notes (Signed)
Pt seen 04/16/2014 for [facility] 30 day follow up. Device interrogated by industry. Implant site well healed, no redness or edema. Device function-1 tachy episode 04/01/14 for 28 seconds with pauses. 1 AFIB episode 03/31/14 for 7 hours. 2 symptom episodes 04/11/14 and 04/12/14. AF burden 1.3%. Device report sent to Dr. Rayann Heman for review. Next appt with Dr. Rayann Heman is 06/24/14 @ 0930 .

## 2014-04-20 ENCOUNTER — Other Ambulatory Visit: Payer: Self-pay | Admitting: Interventional Cardiology

## 2014-04-23 ENCOUNTER — Other Ambulatory Visit: Payer: Self-pay | Admitting: Interventional Cardiology

## 2014-04-26 LAB — MDC_IDC_ENUM_SESS_TYPE_REMOTE: MDC IDC SESS DTM: 20160212050500

## 2014-04-30 ENCOUNTER — Encounter: Payer: Self-pay | Admitting: Internal Medicine

## 2014-05-07 ENCOUNTER — Encounter: Payer: Self-pay | Admitting: Internal Medicine

## 2014-05-08 ENCOUNTER — Encounter: Payer: Self-pay | Admitting: Internal Medicine

## 2014-05-11 ENCOUNTER — Ambulatory Visit (INDEPENDENT_AMBULATORY_CARE_PROVIDER_SITE_OTHER): Payer: Medicare Other | Admitting: *Deleted

## 2014-05-11 DIAGNOSIS — I480 Paroxysmal atrial fibrillation: Secondary | ICD-10-CM | POA: Diagnosis not present

## 2014-05-14 DIAGNOSIS — I4891 Unspecified atrial fibrillation: Secondary | ICD-10-CM | POA: Diagnosis not present

## 2014-05-14 DIAGNOSIS — Z7901 Long term (current) use of anticoagulants: Secondary | ICD-10-CM | POA: Diagnosis not present

## 2014-05-14 NOTE — Progress Notes (Signed)
Loop recorder 

## 2014-05-15 DIAGNOSIS — M5030 Other cervical disc degeneration, unspecified cervical region: Secondary | ICD-10-CM | POA: Diagnosis not present

## 2014-05-15 DIAGNOSIS — M9901 Segmental and somatic dysfunction of cervical region: Secondary | ICD-10-CM | POA: Diagnosis not present

## 2014-05-15 DIAGNOSIS — M542 Cervicalgia: Secondary | ICD-10-CM | POA: Diagnosis not present

## 2014-05-15 DIAGNOSIS — G441 Vascular headache, not elsewhere classified: Secondary | ICD-10-CM | POA: Diagnosis not present

## 2014-05-22 ENCOUNTER — Encounter: Payer: Self-pay | Admitting: Internal Medicine

## 2014-05-22 LAB — MDC_IDC_ENUM_SESS_TYPE_REMOTE: MDC IDC SESS DTM: 20160311050500

## 2014-05-25 ENCOUNTER — Encounter: Payer: Self-pay | Admitting: Internal Medicine

## 2014-05-25 DIAGNOSIS — Z803 Family history of malignant neoplasm of breast: Secondary | ICD-10-CM | POA: Diagnosis not present

## 2014-05-25 DIAGNOSIS — Z1231 Encounter for screening mammogram for malignant neoplasm of breast: Secondary | ICD-10-CM | POA: Diagnosis not present

## 2014-06-03 ENCOUNTER — Encounter: Payer: Self-pay | Admitting: Internal Medicine

## 2014-06-09 ENCOUNTER — Other Ambulatory Visit: Payer: Self-pay

## 2014-06-09 MED ORDER — FUROSEMIDE 40 MG PO TABS: 40.0000 mg | ORAL_TABLET | Freq: Two times a day (BID) | ORAL | Status: DC | PRN

## 2014-06-10 ENCOUNTER — Ambulatory Visit (INDEPENDENT_AMBULATORY_CARE_PROVIDER_SITE_OTHER): Payer: Medicare Other | Admitting: *Deleted

## 2014-06-10 ENCOUNTER — Encounter: Payer: Self-pay | Admitting: Internal Medicine

## 2014-06-10 DIAGNOSIS — I480 Paroxysmal atrial fibrillation: Secondary | ICD-10-CM

## 2014-06-11 DIAGNOSIS — I4891 Unspecified atrial fibrillation: Secondary | ICD-10-CM | POA: Diagnosis not present

## 2014-06-11 DIAGNOSIS — Z7901 Long term (current) use of anticoagulants: Secondary | ICD-10-CM | POA: Diagnosis not present

## 2014-06-15 NOTE — Progress Notes (Signed)
Loop recorder 

## 2014-06-22 DIAGNOSIS — Z08 Encounter for follow-up examination after completed treatment for malignant neoplasm: Secondary | ICD-10-CM | POA: Diagnosis not present

## 2014-06-22 DIAGNOSIS — Z85828 Personal history of other malignant neoplasm of skin: Secondary | ICD-10-CM | POA: Diagnosis not present

## 2014-06-22 DIAGNOSIS — L821 Other seborrheic keratosis: Secondary | ICD-10-CM | POA: Diagnosis not present

## 2014-06-22 DIAGNOSIS — L570 Actinic keratosis: Secondary | ICD-10-CM | POA: Diagnosis not present

## 2014-06-22 DIAGNOSIS — D0472 Carcinoma in situ of skin of left lower limb, including hip: Secondary | ICD-10-CM | POA: Diagnosis not present

## 2014-06-24 ENCOUNTER — Encounter: Payer: Medicare Other | Admitting: Internal Medicine

## 2014-06-26 DIAGNOSIS — M9901 Segmental and somatic dysfunction of cervical region: Secondary | ICD-10-CM | POA: Diagnosis not present

## 2014-06-26 DIAGNOSIS — M542 Cervicalgia: Secondary | ICD-10-CM | POA: Diagnosis not present

## 2014-06-26 DIAGNOSIS — G441 Vascular headache, not elsewhere classified: Secondary | ICD-10-CM | POA: Diagnosis not present

## 2014-06-26 DIAGNOSIS — M5030 Other cervical disc degeneration, unspecified cervical region: Secondary | ICD-10-CM | POA: Diagnosis not present

## 2014-07-01 LAB — MDC_IDC_ENUM_SESS_TYPE_REMOTE: MDC IDC SESS DTM: 20160324040500

## 2014-07-06 ENCOUNTER — Other Ambulatory Visit: Payer: Self-pay | Admitting: Interventional Cardiology

## 2014-07-07 DIAGNOSIS — Z961 Presence of intraocular lens: Secondary | ICD-10-CM | POA: Diagnosis not present

## 2014-07-07 DIAGNOSIS — H26493 Other secondary cataract, bilateral: Secondary | ICD-10-CM | POA: Diagnosis not present

## 2014-07-07 DIAGNOSIS — H04123 Dry eye syndrome of bilateral lacrimal glands: Secondary | ICD-10-CM | POA: Diagnosis not present

## 2014-07-09 DIAGNOSIS — I4891 Unspecified atrial fibrillation: Secondary | ICD-10-CM | POA: Diagnosis not present

## 2014-07-09 DIAGNOSIS — Z7901 Long term (current) use of anticoagulants: Secondary | ICD-10-CM | POA: Diagnosis not present

## 2014-07-10 ENCOUNTER — Ambulatory Visit (INDEPENDENT_AMBULATORY_CARE_PROVIDER_SITE_OTHER): Payer: Medicare Other | Admitting: *Deleted

## 2014-07-10 ENCOUNTER — Ambulatory Visit: Payer: Medicare Other

## 2014-07-10 DIAGNOSIS — I480 Paroxysmal atrial fibrillation: Secondary | ICD-10-CM | POA: Diagnosis not present

## 2014-07-10 LAB — CUP PACEART REMOTE DEVICE CHECK: Date Time Interrogation Session: 20160526140607

## 2014-07-17 ENCOUNTER — Encounter: Payer: Self-pay | Admitting: Internal Medicine

## 2014-07-17 NOTE — Progress Notes (Signed)
Loop recorder 

## 2014-07-21 DIAGNOSIS — M25562 Pain in left knee: Secondary | ICD-10-CM | POA: Diagnosis not present

## 2014-07-21 DIAGNOSIS — M1712 Unilateral primary osteoarthritis, left knee: Secondary | ICD-10-CM | POA: Diagnosis not present

## 2014-07-28 DIAGNOSIS — S83242A Other tear of medial meniscus, current injury, left knee, initial encounter: Secondary | ICD-10-CM | POA: Diagnosis not present

## 2014-07-29 ENCOUNTER — Encounter: Payer: Self-pay | Admitting: Internal Medicine

## 2014-07-29 ENCOUNTER — Other Ambulatory Visit: Payer: Self-pay

## 2014-07-29 ENCOUNTER — Ambulatory Visit (INDEPENDENT_AMBULATORY_CARE_PROVIDER_SITE_OTHER): Payer: Medicare Other | Admitting: Internal Medicine

## 2014-07-29 VITALS — BP 98/52 | HR 68 | Ht 62.0 in | Wt 193.2 lb

## 2014-07-29 DIAGNOSIS — I480 Paroxysmal atrial fibrillation: Secondary | ICD-10-CM | POA: Diagnosis not present

## 2014-07-29 LAB — CUP PACEART INCLINIC DEVICE CHECK
MDC IDC SESS DTM: 20160525130728
MDC IDC SESS DTM: 20160525130023
MDC IDC SET ZONE DETECTION INTERVAL: 3000 ms
Zone Setting Detection Interval: 2000 ms
Zone Setting Detection Interval: 3000 ms
Zone Setting Detection Interval: 400 ms
Zone Setting Detection Interval: 2000 ms
Zone Setting Detection Interval: 400 ms

## 2014-07-29 NOTE — Patient Instructions (Signed)
Medication Instructions:  Your physician has recommended you make the following change in your medication:  1) Decrease Furosemide to 40 mg daily  Labwork: None ordered  Testing/Procedures: None ordered  Follow-Up: Your physician recommends that you schedule a follow-up appointment next available with Dr Irish Lack    Any Other Special Instructions Will Be Listed Below (If Applicable).

## 2014-07-30 ENCOUNTER — Telehealth: Payer: Self-pay | Admitting: Interventional Cardiology

## 2014-07-30 NOTE — Telephone Encounter (Signed)
New Message       Pt calling stating that she has an appt on 09/17/14 and she thinks Dr. Irish Lack wants her to have lab work prior to this appt and she needs for the orders to be faxed to [facility] at [facility] Phone Number: [phone]. Please call back and advise.

## 2014-07-30 NOTE — Telephone Encounter (Signed)
Informed pt that I would find out from Dr. Irish Lack which labs he would like drawn and I would get those orders faxed over for her. Informed pt I would call her and let her know once they were sent. Pt verbalized understanding and was in agreement with this plan.

## 2014-07-30 NOTE — Progress Notes (Signed)
Electrophysiology Office Note   Date:  07/30/2014   ID:  [name], Liner [birth year], MRN [id number]  PCP:  Abigail Miyamoto, MD  Cardiologist:  Dr Irish Lack Primary Electrophysiologist: Thompson Grayer, MD    Chief Complaint  Patient presents with  . PAF     History of Present Illness: Lynn Dunn is a 81 y.o. female who presents today for electrophysiology evaluation.   Doing well at this time.  AF is well controlled per ILR interrogation.  Has postural dizziness at times.  Atypical chest pain is stable.   Today, she denies symptoms of palpitations,shortness of breath, orthopnea, PND, lower extremity edema, claudication,  presyncope, syncope, bleeding, or neurologic sequela. The patient is tolerating medications without difficulties and is otherwise without complaint today.    Past Medical History  Diagnosis Date  . HTN (hypertension)   . PAF (paroxysmal atrial fibrillation)     s/p ablation 07/29/09  . COPD (chronic obstructive pulmonary disease)   . GERD (gastroesophageal reflux disease)   . Obesity   . Stroke 6/09  . Anginal pain 2011    had to take nitro   . OSA (obstructive sleep apnea)     cpap at home  . Heart murmur   . Anemia   . Hx of echocardiogram     Echo (12/15):  Mild LVH, EF 60-65%, mild MR, mod LAE (LA 49 mm), PASP 43 mmHg   Past Surgical History  Procedure Laterality Date  . Atrial ablation surgery      s/p ablation for afib 07/29/09  . Abdominal hysterectomy    . Esophagogastroduodenoscopy  01/24/2012    Procedure: ESOPHAGOGASTRODUODENOSCOPY (EGD);  Surgeon: Jeryl Columbia, MD;  Location: [facility] ENDOSCOPY;  Service: Endoscopy;  Laterality: N/A;  . Loop recorder implant N/A 03/12/2014    Procedure: LOOP RECORDER IMPLANT;  Surgeon: Thompson Grayer, MD;  Location: [facility] CATH LAB;  Service: Cardiovascular;  Laterality: N/A;     Current Outpatient Prescriptions  Medication Sig Dispense Refill  . Cholecalciferol (VITAMIN D PO) Take 1 capsule by mouth daily.     . Coenzyme Q10 (COQ10 PO) Take 1 capsule by mouth daily.    . CRESTOR 20 MG tablet TAKE 1 TABLET EVERY DAY (Patient taking differently: TAKE 1 TABLET BY MOUTH EVERY DAY) 90 tablet 1  . furosemide (LASIX) 40 MG tablet Take 40 mg by mouth daily.    Marland Kitchen HYDROcodone-acetaminophen (NORCO/VICODIN) 5-325 MG per tablet Take 0.5-1 tablets by mouth every 6 (six) hours as needed. pain  0  . KRILL OIL OMEGA-3 PO Take 2 capsules by mouth twice daily.    . Magnesium 100 MG CAPS Take 1 capsule by mouth 2 (two) times daily.     . metoprolol tartrate (LOPRESSOR) 25 MG tablet TAKE 1 TABLET TWICE DAILY.  MAY TAKE ONE EXTRA 25MG  TABLET DAILY IF YOU FEEL YOUR HEART IS OUT OF RHYTHM. (Patient taking differently: TAKE 1 TABLET BY MOUTH TWICE DAILY.  MAY TAKE ONE EXTRA 25MG  TABLET DAILY IF YOU FEEL YOUR HEART IS OUT OF RHYTHM.) 270 tablet 3  . potassium chloride (K-DUR) 10 MEQ tablet Take 10 mEq by mouth daily.    . ranitidine (ZANTAC) 150 MG capsule Take 150 mg by mouth daily as needed for heartburn.     . warfarin (COUMADIN) 1 MG tablet Take 1 mg by mouth 2 (two) times a week. Wednesday and Sunday takes to 4 mg dose    . warfarin (COUMADIN) 3 MG tablet Take 3 mg  by mouth daily. Takes an additonal 1 mg on Wednesday and Sunday. Entered in computer.     No current facility-administered medications for this visit.    Allergies:   Albuterol   Social History:  The patient  reports that she has never smoked. She does not have any smokeless tobacco history on file. She reports that she does not drink alcohol or use illicit drugs.   Family History:  The patient's family history includes Brain cancer in her father and another family member; Breast cancer in her mother and another family member; Hypertension in her sister; Stroke in her maternal aunt. There is no history of Heart attack.    ROS:  Please see the history of present illness.   All other systems are reviewed and negative.    PHYSICAL EXAM: VS:  BP 98/52 mmHg   Pulse 68  Ht 5\' 2"  (1.575 m)  Wt 87.635 kg (193 lb 3.2 oz)  BMI 35.33 kg/m2 , BMI Body mass index is 35.33 kg/(m^2). GEN: Well nourished, well developed, in no acute distress HEENT: normal with dry MM Neck: no JVD, carotid bruits, or masses Cardiac: RRR; no murmurs, rubs, or gallops,no edema  Respiratory:  clear to auscultation bilaterally, normal work of breathing GI: soft, nontender, nondistended, + BS MS: no deformity or atrophy Skin: warm and dry  Neuro:  Strength and sensation are intact Psych: euthymic mood, full affect  EKG:  EKG is ordered today. The ekg ordered today shows sinus rhythm with no ischemic changes  ILR interrogation is reviewed and reveals very low AF burden (<1%)   Lipid Panel     Component Value Date/Time   CHOL  12/25/2009 0505    126        ATP III CLASSIFICATION:  <200     mg/dL   Desirable  200-239  mg/dL   Borderline High  >=240    mg/dL   High          TRIG 92 12/25/2009 0505   HDL 56 12/25/2009 0505   CHOLHDL 2.3 12/25/2009 0505   VLDL 18 12/25/2009 0505   LDLCALC  12/25/2009 0505    52        Total Cholesterol/HDL:CHD Risk Coronary Heart Disease Risk Table                     Men   Women  1/2 Average Risk   3.4   3.3  Average Risk       5.0   4.4  2 X Average Risk   9.6   7.1  3 X Average Risk  23.4   11.0        Use the calculated Patient Ratio above and the CHD Risk Table to determine the patient's CHD Risk.        ATP III CLASSIFICATION (LDL):  <100     mg/dL   Optimal  100-129  mg/dL   Near or Above                    Optimal  130-159  mg/dL   Borderline  160-189  mg/dL   High  >190     mg/dL   Very High     Wt Readings from Last 3 Encounters:  07/29/14 87.635 kg (193 lb 3.2 oz)  03/12/14 90.719 kg (200 lb)  03/11/14 92.534 kg (204 lb)     ASSESSMENT AND PLAN:  1. afib Very low AF burden by ILR interrogation  Continue current medicines  2. OSA Continue CPAP  3. HTN Stable No change required today  4.  Obesity Body mass index is 35.33 kg/(m^2).  Lifestyle modification discussed  cardiofit results discussed  5. Atypical chest pain No further workup at this time  6. Postural dizziness Appears dry today Decrease lasix to 40mg  daily  Follow-up with Dr Irish Lack for routine cardiology management I will follow remotely through ILR and see prn if AF burden increases  Current medicines are reviewed at length with the patient today.   The patient does not have concerns regarding her medicines.  The following changes were made today: none  Labs/ tests ordered today include:  Orders Placed This Encounter  Procedures  . Implantable device check  . Implantable device check     Signed, Thompson Grayer, MD    [place] [address] [phone] (office) [phone] (fax)

## 2014-08-02 NOTE — Telephone Encounter (Signed)
Please see what was drawn in the [place] system.

## 2014-08-04 ENCOUNTER — Encounter: Payer: Self-pay | Admitting: Internal Medicine

## 2014-08-04 NOTE — Telephone Encounter (Signed)
Dr. Delman Kitten office is sending most recent labs over (CBC and CMP from 01/2014).

## 2014-08-06 DIAGNOSIS — Z7901 Long term (current) use of anticoagulants: Secondary | ICD-10-CM | POA: Diagnosis not present

## 2014-08-06 DIAGNOSIS — I4891 Unspecified atrial fibrillation: Secondary | ICD-10-CM | POA: Diagnosis not present

## 2014-08-08 DIAGNOSIS — M25562 Pain in left knee: Secondary | ICD-10-CM | POA: Diagnosis not present

## 2014-08-10 ENCOUNTER — Ambulatory Visit (INDEPENDENT_AMBULATORY_CARE_PROVIDER_SITE_OTHER): Payer: Medicare Other | Admitting: *Deleted

## 2014-08-10 DIAGNOSIS — I480 Paroxysmal atrial fibrillation: Secondary | ICD-10-CM

## 2014-08-12 NOTE — Progress Notes (Signed)
Loop recorder 

## 2014-08-13 DIAGNOSIS — S83242D Other tear of medial meniscus, current injury, left knee, subsequent encounter: Secondary | ICD-10-CM | POA: Diagnosis not present

## 2014-08-13 DIAGNOSIS — M1711 Unilateral primary osteoarthritis, right knee: Secondary | ICD-10-CM | POA: Diagnosis not present

## 2014-08-14 ENCOUNTER — Encounter: Payer: Self-pay | Admitting: Internal Medicine

## 2014-08-20 ENCOUNTER — Telehealth: Payer: Self-pay | Admitting: Interventional Cardiology

## 2014-08-20 DIAGNOSIS — M1711 Unilateral primary osteoarthritis, right knee: Secondary | ICD-10-CM | POA: Diagnosis not present

## 2014-08-20 DIAGNOSIS — E782 Mixed hyperlipidemia: Secondary | ICD-10-CM

## 2014-08-20 LAB — CUP PACEART REMOTE DEVICE CHECK: MDC IDC SESS DTM: 20160616121200

## 2014-08-20 NOTE — Telephone Encounter (Signed)
New message      Pt has an appt on the 14th.  She is also having surgery.  She is probably going to cancel her appt but she needs Dr Irish Lack to order blood work.  When asked what exactly she needs, she said "blood work for surgery"

## 2014-08-20 NOTE — Telephone Encounter (Signed)
Spoke with pt and she states that she has an appt with Dr. Irish Lack on 7/14 but that she will be in [place] at that time and needs to reschedule. Pt states that she is having a knee surgery shortly after returning from [place] so she needs to be seen by Dr. Irish Lack prior to leaving so that she can get the clearance that she needs for surgery. Pt states that she is also due for cholesterol lab work and needs labs for her surgery. Last fasting labs are from 05/2013. Informed pt that I can schedule her for fasting lab work but typically the doctor that orders the surgery will order the labs they need for surgery. Rescheduled pt to see Dr. Irish Lack and scheduled fasting labs. Pt verbalized understanding and was in agreement with this plan.

## 2014-08-21 ENCOUNTER — Telehealth: Payer: Self-pay | Admitting: Interventional Cardiology

## 2014-08-21 DIAGNOSIS — G441 Vascular headache, not elsewhere classified: Secondary | ICD-10-CM | POA: Diagnosis not present

## 2014-08-21 DIAGNOSIS — M9901 Segmental and somatic dysfunction of cervical region: Secondary | ICD-10-CM | POA: Diagnosis not present

## 2014-08-21 DIAGNOSIS — M5030 Other cervical disc degeneration, unspecified cervical region: Secondary | ICD-10-CM | POA: Diagnosis not present

## 2014-08-21 DIAGNOSIS — M542 Cervicalgia: Secondary | ICD-10-CM | POA: Diagnosis not present

## 2014-08-21 NOTE — Telephone Encounter (Signed)
New Message  Pt returning (per pt) Jennifer's phone call concerning lab results. Please call back and discuss.

## 2014-08-21 NOTE — Telephone Encounter (Signed)
Spoke with pt and she states that someone from our office called her about her having a new medication for her surgery. Pt played back voicemail and it was Gay Filler who had called her. Spoke with Gay Filler and she said that Dr. Irish Lack cleared her for surgery but she would need Lovenox bridging for this and that whoever follows her Coumadin would need to set this up. Informed pt of this information and to have her [facility] that follows her coumadin address the Lovenox bridging. Pt verbalized understanding and was in agreement with this plan.

## 2014-08-27 DIAGNOSIS — M1711 Unilateral primary osteoarthritis, right knee: Secondary | ICD-10-CM | POA: Diagnosis not present

## 2014-08-31 ENCOUNTER — Encounter: Payer: Self-pay | Admitting: Interventional Cardiology

## 2014-08-31 ENCOUNTER — Other Ambulatory Visit (INDEPENDENT_AMBULATORY_CARE_PROVIDER_SITE_OTHER): Payer: Medicare Other | Admitting: *Deleted

## 2014-08-31 ENCOUNTER — Ambulatory Visit (INDEPENDENT_AMBULATORY_CARE_PROVIDER_SITE_OTHER): Payer: Medicare Other | Admitting: Interventional Cardiology

## 2014-08-31 VITALS — BP 104/58 | HR 64 | Wt 191.8 lb

## 2014-08-31 DIAGNOSIS — Z01810 Encounter for preprocedural cardiovascular examination: Secondary | ICD-10-CM

## 2014-08-31 DIAGNOSIS — M7989 Other specified soft tissue disorders: Secondary | ICD-10-CM | POA: Diagnosis not present

## 2014-08-31 DIAGNOSIS — I480 Paroxysmal atrial fibrillation: Secondary | ICD-10-CM

## 2014-08-31 DIAGNOSIS — E782 Mixed hyperlipidemia: Secondary | ICD-10-CM

## 2014-08-31 LAB — LIPID PANEL
Cholesterol: 170 mg/dL (ref 0–200)
HDL: 51.8 mg/dL (ref >39.00)
LDL Cholesterol: 86 mg/dL (ref 0–99)
NONHDL: 118.2
Total CHOL/HDL Ratio: 3
Triglycerides: 162 mg/dL — ABNORMAL HIGH (ref 0.0–149.0)
VLDL: 32.4 mg/dL (ref 0.0–40.0)

## 2014-08-31 LAB — HEPATIC FUNCTION PANEL
ALBUMIN: 4.1 g/dL (ref 3.5–5.2)
ALT: 16 U/L (ref 0–35)
AST: 20 U/L (ref 0–37)
Alkaline Phosphatase: 77 U/L (ref 39–117)
BILIRUBIN DIRECT: 0 mg/dL (ref 0.0–0.3)
Total Bilirubin: 0.6 mg/dL (ref 0.2–1.2)
Total Protein: 6.8 g/dL (ref 6.0–8.3)

## 2014-08-31 NOTE — Patient Instructions (Signed)
Medication Instructions:  Same-no change  Labwork: None  Testing/Procedures: None  Follow-Up: Your physician wants you to follow-up in: 1 year. You will receive a reminder letter in the mail two months in advance. If you don't receive a letter, please call our office to schedule the follow-up appointment.      

## 2014-08-31 NOTE — Progress Notes (Signed)
Patient ID: Lynn Dunn, female   DOB: [birth year], 81 y.o.   MRN: [id number]     Cardiology Office Note   Date:  08/31/2014   ID:  Lynn Dunn, DOB 1931-02-04, MRN [id number]  PCP:  Abigail Miyamoto, MD    No chief complaint on file.  Follow-up atrial fibrillation  Wt Readings from Last 3 Encounters:  08/31/14 191 lb 12.8 oz (87 kg)  07/29/14 193 lb 3.2 oz (87.635 kg)  03/12/14 200 lb (90.719 kg)       History of Present Illness: Lynn Dunn is a 81 y.o. female  Who has had AFib and CVA in the past, treated with TPA. She has had atrial fibrillation ablation in the past.   She has a loop monitor in place due to syncope/near syncope.  She has not had further episodes in the past year.  She reports right ankle swelling. It feels like a mass on the inside of her right ankle. It has been getting larger over the last few months.  It does decrease in size in the morning after she has had her feet up.  She reports that her husband is not as mobile. He had a leg operation but is unable to straighten the leg now.    Past Medical History  Diagnosis Date  . HTN (hypertension)   . PAF (paroxysmal atrial fibrillation)     s/p ablation 07/29/09  . COPD (chronic obstructive pulmonary disease)   . GERD (gastroesophageal reflux disease)   . Obesity   . Stroke 6/09  . Anginal pain 2011    had to take nitro   . OSA (obstructive sleep apnea)     cpap at home  . Heart murmur   . Anemia   . Hx of echocardiogram     Echo (12/15):  Mild LVH, EF 60-65%, mild MR, mod LAE (LA 49 mm), PASP 43 mmHg    Past Surgical History  Procedure Laterality Date  . Atrial ablation surgery      s/p ablation for afib 07/29/09  . Abdominal hysterectomy    . Esophagogastroduodenoscopy  01/24/2012    Procedure: ESOPHAGOGASTRODUODENOSCOPY (EGD);  Surgeon: Jeryl Columbia, MD;  Location: [facility] ENDOSCOPY;  Service: Endoscopy;  Laterality: N/A;  . Loop recorder implant N/A 03/12/2014    Procedure: LOOP RECORDER  IMPLANT;  Surgeon: Thompson Grayer, MD;  Location: [facility] CATH LAB;  Service: Cardiovascular;  Laterality: N/A;     Current Outpatient Prescriptions  Medication Sig Dispense Refill  . Cholecalciferol (VITAMIN D PO) Take 2 capsules by mouth daily.     . Coenzyme Q10 (COQ10 PO) Take 1 capsule by mouth daily.    . furosemide (LASIX) 40 MG tablet Take 40 mg by mouth 2 (two) times daily.     Marland Kitchen HYDROcodone-acetaminophen (NORCO/VICODIN) 5-325 MG per tablet Take 0.5-1 tablets by mouth every 6 (six) hours as needed. pain  0  . KRILL OIL OMEGA-3 PO Take 1 capsule by mouth twice daily.    . Magnesium 100 MG CAPS Take 1 capsule by mouth 2 (two) times daily.     . metoprolol tartrate (LOPRESSOR) 25 MG tablet TAKE 1 TABLET TWICE DAILY.  MAY TAKE ONE EXTRA 25MG  TABLET DAILY IF YOU FEEL YOUR HEART IS OUT OF RHYTHM. (Patient taking differently: TAKE 1 TABLET BY MOUTH TWICE DAILY.  MAY TAKE ONE EXTRA 25MG  TABLET DAILY IF YOU FEEL YOUR HEART IS OUT OF RHYTHM.) 270 tablet 3  . potassium chloride (K-DUR) 10 MEQ  tablet Take 10 mEq by mouth daily.    . ranitidine (ZANTAC) 150 MG capsule Take 150 mg by mouth daily as needed for heartburn.     . rosuvastatin (CRESTOR) 20 MG tablet Take 20 mg by mouth daily.    Marland Kitchen warfarin (COUMADIN) 1 MG tablet Take 1 mg by mouth 2 (two) times a week. Wednesday and Sunday takes to 4 mg dose    . warfarin (COUMADIN) 3 MG tablet Take 3 mg by mouth daily. Takes an additonal 1 mg on Wednesday and Sunday. Entered in computer.     No current facility-administered medications for this visit.    Allergies:   Albuterol    Social History:  The patient  reports that she has never smoked. She does not have any smokeless tobacco history on file. She reports that she does not drink alcohol or use illicit drugs.   Family History:  The patient's family history includes Brain cancer in her father and another family member; Breast cancer in her mother and another family member; Hypertension in her sister;  Stroke in her maternal aunt. There is no history of Heart attack.    ROS:  Please see the history of present illness.   Otherwise, review of systems are positive for right ankle swelling.   All other systems are reviewed and negative.    PHYSICAL EXAM: VS:  BP 104/58 mmHg  Pulse 64  Wt 191 lb 12.8 oz (87 kg)  SpO2 98% , BMI Body mass index is 35.07 kg/(m^2). GEN: Well nourished, well developed, in no acute distress HEENT: normal Neck: no JVD, carotid bruits, or masses Cardiac: RRR; no murmurs, rubs, or gallops,no edema  Respiratory:  clear to auscultation bilaterally, normal work of breathing GI: soft, nontender, nondistended, + BS MS: no deformity or atrophy Skin: warm and dry, no rash Neuro:  Strength and sensation are intact Psych: euthymic mood, full affect     Recent Labs: 08/31/2014: ALT 16   Lipid Panel    Component Value Date/Time   CHOL 170 08/31/2014 0941   TRIG 162.0* 08/31/2014 0941   HDL 51.80 08/31/2014 0941   CHOLHDL 3 08/31/2014 0941   VLDL 32.4 08/31/2014 0941   LDLCALC 86 08/31/2014 0941     Other studies Reviewed: Additional studies/ records that were reviewed today with results demonstrating: Ultrasound as described below..   ASSESSMENT AND PLAN:  1. Leg swelling: We performed an ultrasound in the office of this mass in her right ankle. A cystic structure was noted that was 0.8 cm x 0.8 cm. There were adjacent varicosities as well at the ankle. Would not plan any further imaging at this time. If this area becomes more uncomfortable for her, could send her for further imaging and possible drainage.  2. Hyperlipidemia: Continue Crestor. She has had an MR panel drawn earlier today. Will follow-up with that result. 3. Preoperative evaluation: She will be having knee surgery. No further cardiac testing required before then. She will need to stop her warfarin several days ahead of time. We'll speak to the Pharm.D. regarding bridging during the perioperative  period. 4. Atrial fibrillation: Maintaining sinus rhythm at this time. She also follows with Dr. Rayann Heman. She has a loop monitor in place which is also being followed by EP.   Current medicines are reviewed at length with the patient today.  The patient concerns regarding her medicines were addressed.  The following changes have been made:  No change  Labs/ tests ordered today include: Ultrasound as  described above  No orders of the defined types were placed in this encounter.    Recommend 150 minutes/week of aerobic exercise Low fat, low carb, high fiber diet recommended  Disposition:   FU in one year   Teresita Madura., MD  08/31/2014 7:11 PM    [facility] [place], [place], [place]  [address] Phone: [phone]; Fax: [phone]

## 2014-09-01 ENCOUNTER — Encounter: Payer: Self-pay | Admitting: Internal Medicine

## 2014-09-03 DIAGNOSIS — Z7901 Long term (current) use of anticoagulants: Secondary | ICD-10-CM | POA: Diagnosis not present

## 2014-09-03 DIAGNOSIS — I4891 Unspecified atrial fibrillation: Secondary | ICD-10-CM | POA: Diagnosis not present

## 2014-09-08 ENCOUNTER — Ambulatory Visit (INDEPENDENT_AMBULATORY_CARE_PROVIDER_SITE_OTHER): Payer: Medicare Other | Admitting: *Deleted

## 2014-09-08 ENCOUNTER — Encounter: Payer: Self-pay | Admitting: Interventional Cardiology

## 2014-09-08 DIAGNOSIS — I480 Paroxysmal atrial fibrillation: Secondary | ICD-10-CM | POA: Diagnosis not present

## 2014-09-08 DIAGNOSIS — M1711 Unilateral primary osteoarthritis, right knee: Secondary | ICD-10-CM | POA: Diagnosis not present

## 2014-09-09 LAB — CUP PACEART REMOTE DEVICE CHECK: MDC IDC SESS DTM: 20160706131809

## 2014-09-09 NOTE — Progress Notes (Signed)
Loop recorder 

## 2014-09-10 DIAGNOSIS — I4891 Unspecified atrial fibrillation: Secondary | ICD-10-CM | POA: Diagnosis not present

## 2014-09-10 DIAGNOSIS — G4733 Obstructive sleep apnea (adult) (pediatric): Secondary | ICD-10-CM | POA: Diagnosis not present

## 2014-09-10 DIAGNOSIS — E785 Hyperlipidemia, unspecified: Secondary | ICD-10-CM | POA: Diagnosis not present

## 2014-09-10 DIAGNOSIS — R300 Dysuria: Secondary | ICD-10-CM | POA: Diagnosis not present

## 2014-09-10 DIAGNOSIS — I10 Essential (primary) hypertension: Secondary | ICD-10-CM | POA: Diagnosis not present

## 2014-09-10 DIAGNOSIS — N183 Chronic kidney disease, stage 3 (moderate): Secondary | ICD-10-CM | POA: Diagnosis not present

## 2014-09-10 DIAGNOSIS — E1122 Type 2 diabetes mellitus with diabetic chronic kidney disease: Secondary | ICD-10-CM | POA: Diagnosis not present

## 2014-09-10 DIAGNOSIS — Z7901 Long term (current) use of anticoagulants: Secondary | ICD-10-CM | POA: Diagnosis not present

## 2014-09-10 DIAGNOSIS — Z0000 Encounter for general adult medical examination without abnormal findings: Secondary | ICD-10-CM | POA: Diagnosis not present

## 2014-09-15 DIAGNOSIS — I4891 Unspecified atrial fibrillation: Secondary | ICD-10-CM | POA: Diagnosis not present

## 2014-09-15 DIAGNOSIS — Z7901 Long term (current) use of anticoagulants: Secondary | ICD-10-CM | POA: Diagnosis not present

## 2014-09-15 DIAGNOSIS — M1711 Unilateral primary osteoarthritis, right knee: Secondary | ICD-10-CM | POA: Diagnosis not present

## 2014-09-17 ENCOUNTER — Ambulatory Visit: Payer: Medicare Other | Admitting: Interventional Cardiology

## 2014-09-21 DIAGNOSIS — I459 Conduction disorder, unspecified: Secondary | ICD-10-CM | POA: Diagnosis not present

## 2014-09-21 DIAGNOSIS — I4430 Unspecified atrioventricular block: Secondary | ICD-10-CM | POA: Diagnosis not present

## 2014-09-23 ENCOUNTER — Encounter: Payer: Self-pay | Admitting: Internal Medicine

## 2014-09-29 DIAGNOSIS — T50905A Adverse effect of unspecified drugs, medicaments and biological substances, initial encounter: Secondary | ICD-10-CM | POA: Diagnosis not present

## 2014-09-29 DIAGNOSIS — I442 Atrioventricular block, complete: Secondary | ICD-10-CM | POA: Diagnosis not present

## 2014-10-02 DIAGNOSIS — I4891 Unspecified atrial fibrillation: Secondary | ICD-10-CM | POA: Diagnosis not present

## 2014-10-02 DIAGNOSIS — X58XXXA Exposure to other specified factors, initial encounter: Secondary | ICD-10-CM | POA: Diagnosis not present

## 2014-10-02 DIAGNOSIS — M23342 Other meniscus derangements, anterior horn of lateral meniscus, left knee: Secondary | ICD-10-CM | POA: Diagnosis not present

## 2014-10-02 DIAGNOSIS — Z7901 Long term (current) use of anticoagulants: Secondary | ICD-10-CM | POA: Diagnosis not present

## 2014-10-02 DIAGNOSIS — Y929 Unspecified place or not applicable: Secondary | ICD-10-CM | POA: Diagnosis not present

## 2014-10-02 DIAGNOSIS — S83282A Other tear of lateral meniscus, current injury, left knee, initial encounter: Secondary | ICD-10-CM | POA: Diagnosis not present

## 2014-10-02 DIAGNOSIS — G8918 Other acute postprocedural pain: Secondary | ICD-10-CM | POA: Diagnosis not present

## 2014-10-02 DIAGNOSIS — S83242A Other tear of medial meniscus, current injury, left knee, initial encounter: Secondary | ICD-10-CM | POA: Diagnosis not present

## 2014-10-02 DIAGNOSIS — M23322 Other meniscus derangements, posterior horn of medial meniscus, left knee: Secondary | ICD-10-CM | POA: Diagnosis not present

## 2014-10-02 DIAGNOSIS — M94262 Chondromalacia, left knee: Secondary | ICD-10-CM | POA: Diagnosis not present

## 2014-10-05 DIAGNOSIS — Z7901 Long term (current) use of anticoagulants: Secondary | ICD-10-CM | POA: Diagnosis not present

## 2014-10-05 DIAGNOSIS — I4891 Unspecified atrial fibrillation: Secondary | ICD-10-CM | POA: Diagnosis not present

## 2014-10-07 DIAGNOSIS — I4891 Unspecified atrial fibrillation: Secondary | ICD-10-CM | POA: Diagnosis not present

## 2014-10-07 DIAGNOSIS — Z7901 Long term (current) use of anticoagulants: Secondary | ICD-10-CM | POA: Diagnosis not present

## 2014-10-08 ENCOUNTER — Ambulatory Visit (INDEPENDENT_AMBULATORY_CARE_PROVIDER_SITE_OTHER): Payer: Medicare Other | Admitting: *Deleted

## 2014-10-08 DIAGNOSIS — I480 Paroxysmal atrial fibrillation: Secondary | ICD-10-CM | POA: Diagnosis not present

## 2014-10-08 DIAGNOSIS — Z9889 Other specified postprocedural states: Secondary | ICD-10-CM | POA: Diagnosis not present

## 2014-10-08 LAB — CUP PACEART REMOTE DEVICE CHECK: MDC IDC SESS DTM: 20160726040500

## 2014-10-08 NOTE — Progress Notes (Signed)
Loop recorder 

## 2014-10-15 ENCOUNTER — Encounter: Payer: Self-pay | Admitting: Internal Medicine

## 2014-10-21 ENCOUNTER — Other Ambulatory Visit: Payer: Self-pay | Admitting: Interventional Cardiology

## 2014-10-21 DIAGNOSIS — I4891 Unspecified atrial fibrillation: Secondary | ICD-10-CM | POA: Diagnosis not present

## 2014-10-21 DIAGNOSIS — Z7901 Long term (current) use of anticoagulants: Secondary | ICD-10-CM | POA: Diagnosis not present

## 2014-10-22 ENCOUNTER — Encounter: Payer: Self-pay | Admitting: Internal Medicine

## 2014-11-04 DIAGNOSIS — Z7901 Long term (current) use of anticoagulants: Secondary | ICD-10-CM | POA: Diagnosis not present

## 2014-11-05 DIAGNOSIS — Z9889 Other specified postprocedural states: Secondary | ICD-10-CM | POA: Diagnosis not present

## 2014-11-06 ENCOUNTER — Ambulatory Visit (INDEPENDENT_AMBULATORY_CARE_PROVIDER_SITE_OTHER): Payer: Medicare Other | Admitting: *Deleted

## 2014-11-06 DIAGNOSIS — I480 Paroxysmal atrial fibrillation: Secondary | ICD-10-CM | POA: Diagnosis not present

## 2014-11-11 NOTE — Progress Notes (Signed)
Loop recorder 

## 2014-11-13 LAB — CUP PACEART REMOTE DEVICE CHECK: MDC IDC SESS DTM: 20160909143618

## 2014-11-13 NOTE — Progress Notes (Signed)
Carelink summary report received. Battery status OK. Normal device function. No new symptom episodes, tachy episodes, brady, or pause episodes. No new AF episodes, +warfarin. Monthly summary reports and ROV with JA PRN.

## 2014-12-02 DIAGNOSIS — I4891 Unspecified atrial fibrillation: Secondary | ICD-10-CM | POA: Diagnosis not present

## 2014-12-02 DIAGNOSIS — Z23 Encounter for immunization: Secondary | ICD-10-CM | POA: Diagnosis not present

## 2014-12-02 DIAGNOSIS — Z7901 Long term (current) use of anticoagulants: Secondary | ICD-10-CM | POA: Diagnosis not present

## 2014-12-07 ENCOUNTER — Telehealth: Payer: Self-pay | Admitting: *Deleted

## 2014-12-07 ENCOUNTER — Ambulatory Visit (INDEPENDENT_AMBULATORY_CARE_PROVIDER_SITE_OTHER): Payer: Medicare Other | Admitting: *Deleted

## 2014-12-07 ENCOUNTER — Encounter: Payer: Self-pay | Admitting: Internal Medicine

## 2014-12-07 DIAGNOSIS — I480 Paroxysmal atrial fibrillation: Secondary | ICD-10-CM | POA: Diagnosis not present

## 2014-12-07 NOTE — Telephone Encounter (Signed)
Patient returned phone call.  She reports that she woke up because she felt her "heart racing", but she does not recall experiencing any dizziness or lightheadedness (she was still lying in bed at the time of the episode).  She denies experiencing any other symptoms since this event.  Encouraged patient to call with worsening symptoms, questions, or concerns and she verbalizes understanding.  Episode place in Dr. Otilio Connors folder for review.

## 2014-12-07 NOTE — Telephone Encounter (Signed)
LM with patient's husband requesting call back from patient.  Need to know if patient was symptomatic during pause episode on LINQ transmission from 12/05/14.

## 2014-12-07 NOTE — Progress Notes (Signed)
Loop recorder 

## 2014-12-22 DIAGNOSIS — M25562 Pain in left knee: Secondary | ICD-10-CM | POA: Diagnosis not present

## 2014-12-25 ENCOUNTER — Encounter: Payer: Self-pay | Admitting: Internal Medicine

## 2014-12-28 LAB — CUP PACEART REMOTE DEVICE CHECK: Date Time Interrogation Session: 20161003143605

## 2014-12-28 NOTE — Progress Notes (Signed)
Carelink summary report received. Battery status OK. Normal device function. No new symptom, tachy, or brady episodes. 1 pause episode, duration ~4 sec, patient was lying in bed at the time of the episode, reports that Lynn Dunn woke up because Lynn Dunn felt her "heart racing", but Lynn Dunn does not recall experiencing any dizziness or lightheadedness. 1 AF episode (burden 1.0%), +warfarin, avg V rate controlled. Monthly summary reports and ROV with JA PRN.

## 2015-01-06 ENCOUNTER — Ambulatory Visit (INDEPENDENT_AMBULATORY_CARE_PROVIDER_SITE_OTHER): Payer: Medicare Other | Admitting: *Deleted

## 2015-01-06 DIAGNOSIS — I480 Paroxysmal atrial fibrillation: Secondary | ICD-10-CM

## 2015-01-06 DIAGNOSIS — Z7901 Long term (current) use of anticoagulants: Secondary | ICD-10-CM | POA: Diagnosis not present

## 2015-01-07 NOTE — Progress Notes (Signed)
Loop recorder 

## 2015-01-14 ENCOUNTER — Encounter: Payer: Self-pay | Admitting: Internal Medicine

## 2015-01-14 DIAGNOSIS — J449 Chronic obstructive pulmonary disease, unspecified: Secondary | ICD-10-CM | POA: Diagnosis not present

## 2015-01-14 DIAGNOSIS — R942 Abnormal results of pulmonary function studies: Secondary | ICD-10-CM | POA: Diagnosis not present

## 2015-01-14 DIAGNOSIS — N183 Chronic kidney disease, stage 3 (moderate): Secondary | ICD-10-CM | POA: Diagnosis not present

## 2015-01-20 DIAGNOSIS — I4891 Unspecified atrial fibrillation: Secondary | ICD-10-CM | POA: Diagnosis not present

## 2015-01-20 DIAGNOSIS — Z7901 Long term (current) use of anticoagulants: Secondary | ICD-10-CM | POA: Diagnosis not present

## 2015-01-30 LAB — CUP PACEART REMOTE DEVICE CHECK: Date Time Interrogation Session: 20161102143638

## 2015-01-30 NOTE — Progress Notes (Signed)
Carelink summary report received. Battery status OK. Normal device function. No new symptom episodes, tachy episodes, brady, or pause episodes. 2 AF episodes (burden 1.1%), +warfarin and metoprolol, avg V rate controlled. Will call patient to request a full transmission for review of episodes. Monthly summary reports and ROV with JA PRN.

## 2015-02-05 ENCOUNTER — Ambulatory Visit (INDEPENDENT_AMBULATORY_CARE_PROVIDER_SITE_OTHER): Payer: Medicare Other | Admitting: *Deleted

## 2015-02-05 DIAGNOSIS — I480 Paroxysmal atrial fibrillation: Secondary | ICD-10-CM

## 2015-02-08 NOTE — Progress Notes (Signed)
Carelink Summary Report / Loop Recorder 

## 2015-02-10 ENCOUNTER — Telehealth: Payer: Self-pay | Admitting: Internal Medicine

## 2015-02-10 NOTE — Telephone Encounter (Signed)
Spoke w/ pt and instructed her to send a manual transmission. Pt verbalized understanding.

## 2015-02-10 NOTE — Telephone Encounter (Signed)
Returned patient's call.  Reviewed Carelink transmission, which revealed that patient has an ~9 sec pause on 02/09/15.  Patient reports feeling "awful" during the episode and states that it was "like I was having a seizure or something--I didn't know where I was and then it passed".  Reviewed symptoms and episode with Dr. Rayann Heman, who recommended that patient make appointment to discuss getting a PPM.  Patient is agreeable to appointment on 02/15/15 at 9:45am.  Advised patient to seek emergency services in the meantime if she experiences worsening symptoms.  She is aware and agreeable to plan.  She denies any additional questions or concerns at this time.

## 2015-02-10 NOTE — Telephone Encounter (Signed)
New message     Pt has a loop recorder.  Pt had an "episode" yesterday morning.  She is calling to see if anything was recorded

## 2015-02-15 ENCOUNTER — Ambulatory Visit (INDEPENDENT_AMBULATORY_CARE_PROVIDER_SITE_OTHER): Payer: Medicare Other | Admitting: Internal Medicine

## 2015-02-15 ENCOUNTER — Other Ambulatory Visit: Payer: Self-pay

## 2015-02-15 ENCOUNTER — Encounter: Payer: Self-pay | Admitting: Internal Medicine

## 2015-02-15 VITALS — BP 118/74 | HR 71 | Ht 62.0 in | Wt 194.8 lb

## 2015-02-15 DIAGNOSIS — E669 Obesity, unspecified: Secondary | ICD-10-CM

## 2015-02-15 DIAGNOSIS — I4891 Unspecified atrial fibrillation: Secondary | ICD-10-CM | POA: Diagnosis not present

## 2015-02-15 DIAGNOSIS — I480 Paroxysmal atrial fibrillation: Secondary | ICD-10-CM | POA: Diagnosis not present

## 2015-02-15 DIAGNOSIS — G4733 Obstructive sleep apnea (adult) (pediatric): Secondary | ICD-10-CM

## 2015-02-15 DIAGNOSIS — I495 Sick sinus syndrome: Secondary | ICD-10-CM | POA: Diagnosis not present

## 2015-02-15 DIAGNOSIS — I10 Essential (primary) hypertension: Secondary | ICD-10-CM

## 2015-02-15 LAB — BASIC METABOLIC PANEL
BUN: 22 mg/dL (ref 7–25)
CHLORIDE: 98 mmol/L (ref 98–110)
CO2: 31 mmol/L (ref 20–31)
CREATININE: 0.93 mg/dL — AB (ref 0.60–0.88)
Calcium: 9.3 mg/dL (ref 8.6–10.4)
Glucose, Bld: 108 mg/dL — ABNORMAL HIGH (ref 65–99)
Potassium: 4.1 mmol/L (ref 3.5–5.3)
Sodium: 139 mmol/L (ref 135–146)

## 2015-02-15 LAB — CBC WITH DIFFERENTIAL/PLATELET
BASOS ABS: 0.1 10*3/uL (ref 0.0–0.1)
BASOS PCT: 1 % (ref 0–1)
EOS PCT: 6 % — AB (ref 0–5)
Eosinophils Absolute: 0.4 10*3/uL (ref 0.0–0.7)
HEMATOCRIT: 35.7 % — AB (ref 36.0–46.0)
HEMOGLOBIN: 11.8 g/dL — AB (ref 12.0–15.0)
LYMPHS PCT: 34 % (ref 12–46)
Lymphs Abs: 2.2 10*3/uL (ref 0.7–4.0)
MCH: 30 pg (ref 26.0–34.0)
MCHC: 33.1 g/dL (ref 30.0–36.0)
MCV: 90.8 fL (ref 78.0–100.0)
MONO ABS: 0.6 10*3/uL (ref 0.1–1.0)
MPV: 9.8 fL (ref 8.6–12.4)
Monocytes Relative: 9 % (ref 3–12)
NEUTROS ABS: 3.3 10*3/uL (ref 1.7–7.7)
Neutrophils Relative %: 50 % (ref 43–77)
Platelets: 248 10*3/uL (ref 150–400)
RBC: 3.93 MIL/uL (ref 3.87–5.11)
RDW: 15.5 % (ref 11.5–15.5)
WBC: 6.6 10*3/uL (ref 4.0–10.5)

## 2015-02-15 LAB — CUP PACEART INCLINIC DEVICE CHECK: Date Time Interrogation Session: 20161212144410

## 2015-02-15 LAB — PROTIME-INR
INR: 1.92 — AB (ref <1.50)
PROTHROMBIN TIME: 22.2 s — AB (ref 11.6–15.2)

## 2015-02-15 NOTE — Progress Notes (Signed)
Electrophysiology Office Note   Date:  02/15/2015   ID:  Lynn Dunn, DOB [birth year], MRN [id number]  PCP:  Abigail Miyamoto, MD  Cardiologist:  Dr Irish Lack Primary Electrophysiologist: Thompson Grayer, MD    Chief Complaint  Patient presents with  . PAF     History of Present Illness: Lynn Dunn is a 81 y.o. female who presents today for electrophysiology evaluation.   Doing well at this time.  She presents after an episode of near syncope which occurred early last week.  She reports a very strong sensation of passing out and was disoriented afterwards.  She does not think that she passed out fully though her ILR interrogation revealed a 9 second pause corresponding to the event.  AF burden is 0.7%.  Episodic afib/ atrial flutter with RVR is noted.  Today, she denies symptoms of palpitations,shortness of breath, orthopnea, PND, lower extremity edema, claudication, bleeding, or neurologic sequela. The patient is tolerating medications without difficulties and is otherwise without complaint today.    Past Medical History  Diagnosis Date  . HTN (hypertension)   . PAF (paroxysmal atrial fibrillation) ([place])     s/p ablation 07/29/09  . COPD (chronic obstructive pulmonary disease) ([place])   . GERD (gastroesophageal reflux disease)   . Obesity   . Stroke ([place]) 6/09  . Anginal pain ([place]) 2011    had to take nitro   . OSA (obstructive sleep apnea)     cpap at home  . Heart murmur   . Anemia   . Hx of echocardiogram     Echo (12/15):  Mild LVH, EF 60-65%, mild MR, mod LAE (LA 49 mm), PASP 43 mmHg   Past Surgical History  Procedure Laterality Date  . Atrial ablation surgery      s/p ablation for afib 07/29/09  . Abdominal hysterectomy    . Esophagogastroduodenoscopy  01/24/2012    Procedure: ESOPHAGOGASTRODUODENOSCOPY (EGD);  Surgeon: Jeryl Columbia, MD;  Location: [facility] ENDOSCOPY;  Service: Endoscopy;  Laterality: N/A;  . Loop recorder implant N/A 03/12/2014    Procedure: LOOP  RECORDER IMPLANT;  Surgeon: Thompson Grayer, MD;  Location: [facility] CATH LAB;  Service: Cardiovascular;  Laterality: N/A;     Current Outpatient Prescriptions  Medication Sig Dispense Refill  . Cholecalciferol (VITAMIN D PO) Take 2 capsules by mouth daily.     . Coenzyme Q10 (COQ10 PO) Take 1 capsule by mouth daily.    . furosemide (LASIX) 40 MG tablet Take 1 tablet (40 mg total) by mouth 2 (two) times daily. 180 tablet 3  . HYDROcodone-acetaminophen (NORCO/VICODIN) 5-325 MG per tablet Take 0.5-1 tablets by mouth every 6 (six) hours as needed. pain  0  . KRILL OIL OMEGA-3 PO Take 1 capsule by mouth twice daily.    . Magnesium 100 MG CAPS Take 1 capsule by mouth 2 (two) times daily.     . metoprolol tartrate (LOPRESSOR) 25 MG tablet TAKE 1 TABLET BY MOUTH TWICE DAILY.  MAY TAKE ONE EXTRA 25MG  TABLET DAILY IF YOU FEEL YOUR HEART IS OUT OF RHYTHM.    Marland Kitchen potassium chloride (K-DUR) 10 MEQ tablet Take 10 mEq by mouth daily.    . ranitidine (ZANTAC) 150 MG capsule Take 150 mg by mouth daily as needed for heartburn.     . rosuvastatin (CRESTOR) 20 MG tablet Take 20 mg by mouth daily.    Marland Kitchen warfarin (COUMADIN) 1 MG tablet Take 1 mg by mouth 2 (two) times a week. Wednesday and  Sunday takes to 4 mg dose    . warfarin (COUMADIN) 3 MG tablet Take 3 mg by mouth daily. Takes an additonal 1 mg on Wednesday and Sunday. Entered in computer.     No current facility-administered medications for this visit.    Allergies:   Albuterol   Social History:  The patient  reports that she has never smoked. She does not have any smokeless tobacco history on file. She reports that she does not drink alcohol or use illicit drugs.   Family History:  The patient's family history includes Brain cancer in her father and another family member; Breast cancer in her mother and another family member; Hypertension in her sister; Stroke in her maternal aunt. There is no history of Heart attack.    ROS:  Please see the history of present  illness.   All other systems are reviewed and negative.    PHYSICAL EXAM: VS:  BP 118/74 mmHg  Pulse 71  Ht 5\' 2"  (1.575 m)  Wt 194 lb 12.8 oz (88.361 kg)  BMI 35.62 kg/m2 , BMI Body mass index is 35.62 kg/(m^2). GEN: Well nourished, well developed, in no acute distress HEENT: normal with dry MM Neck: no JVD, carotid bruits, or masses Cardiac: RRR; no murmurs, rubs, or gallops,no edema  Respiratory:  clear to auscultation bilaterally, normal work of breathing GI: soft, nontender, nondistended, + BS MS: no deformity or atrophy Skin: warm and dry  Neuro:  Strength and sensation are intact Psych: euthymic mood, full affect  EKG:  EKG is ordered today. The ekg ordered today shows sinus rhythm 71 bpm, PR 212 msec, Qtc 447 msec, with no ischemic changes,   ILR interrogation is reviewed as above   Lipid Panel     Component Value Date/Time   CHOL 170 08/31/2014 0941   TRIG 162.0* 08/31/2014 0941   HDL 51.80 08/31/2014 0941   CHOLHDL 3 08/31/2014 0941   VLDL 32.4 08/31/2014 0941   LDLCALC 86 08/31/2014 0941     Wt Readings from Last 3 Encounters:  02/15/15 194 lb 12.8 oz (88.361 kg)  08/31/14 191 lb 12.8 oz (87 kg)  07/29/14 193 lb 3.2 oz (87.635 kg)     ASSESSMENT AND PLAN:  1. Sick sinus syndrome 9 second pause observed with presyncope/ syncope clinically Similar to symptoms a year ago The patient has symptomatic sinus bradycardia.  No reversible causes (required AV nodal agents due to AF with elevated V rates).  I would therefore recommend pacemaker implantation at this time.  Risks, benefits, alternatives to pacemaker implantation were discussed in detail with the patient today. The patient understands that the risks include but are not limited to bleeding, infection, pneumothorax, perforation, tamponade, vascular damage, renal failure, MI, stroke, death,  and lead dislodgement and wishes to proceed. We will therefore schedule the procedure at the next available time.  She  wishes to wait until after Christmas.  I have strongly encouraged her to refrain from driving in the interim.  2. afib Very low AF burden by ILR interrogation (0.7%) Continue current medicines Will require life long anticoagulation  3. OSA Continue CPAP  4. HTN Stable No change required today  5.  Obesity Body mass index is 35.62 kg/(m^2).  She struggles with lifestyle modification   Follow-up with Dr Irish Lack for routine cardiology management   Current medicines are reviewed at length with the patient today.   The patient does not have concerns regarding her medicines.  The following changes were made today:  none  Signed, Thompson Grayer, MD    [address] [address] [phone] (office) [phone] (fax)

## 2015-02-15 NOTE — Patient Instructions (Signed)
Medication Instructions:  Your physician recommends that you continue on your current medications as directed. Please refer to the Current Medication list given to you today.   Labwork: Your physician recommends that you return for lab work today:BMP/CBC/INR   Testing/Procedures: Your physician has recommended that you have a pacemaker inserted. A pacemaker is a small device that is placed under the skin of your chest or abdomen to help control abnormal heart rhythms. This device uses electrical pulses to prompt the heart to beat at a normal rate. Pacemakers are used to treat heart rhythms that are too slow. Wire (leads) are attached to the pacemaker that goes into the chambers of you heart. This is done in the hospital and usually requires and overnight stay. Please see the instruction sheet given to you today for more information.    Follow-Up: Your physician recommends that you schedule a follow-up appointment in: 10-14 days from 03/03/15 in device clinic for wound check and in 3 months from 03/03/15 with Dr Rayann Heman   Please arrive at [facility] Entr[facility] on 03/03/15 at 8am.  Do not eat or drink after midnight the night before your procedure.  Do not take any medicatins the morning of your procedure and take your last dose of Coumadin on 02/28/15.     Any Other Special Instructions Will Be Listed Below (If Applicable).       If you need a refill on your cardiac medications before your next appointment, please call your pharmacy.  Pacemaker Implantation The heart has its own electrical system, or natural pacemaker, to regulate the heartbeat. Sometimes, the natural pacemaker system of the heart fails and causes the heart to beat too slowly. If this happens, a pacemaker can be surgically placed to help the heart beat at a normal or programmed rate. A pacemaker is a small, battery-powered device that is placed under the skin and is programmed to sense your  heartbeats. If your heart rate is lower than the programmed rate, the pacemaker will pace your heart. Parts of a pacemaker include:  Wires or leads. The leads are placed in the heart and transmit electricity to the heart. The leads are connected to the pulse generator.  Pulse generator. The pulse generator contains a computer and a memory system. The pulse generator also produces the electrical signal that triggers the heart to beat. A pacemaker may be placed if:  You have a slow heartbeat (bradycardia).  You have fainting (syncope).  Shortness of breath (dyspnea) due to heart problems. LET [facility] CARE PROVIDER KNOW ABOUT:  Any allergies you may have.  All medicines you are taking, including vitamins, herbs, eye drops, creams, and over-the-counter medicines.  Previous problems you or members of your family have had with the use of anesthetics.  Any blood disorders you have.  Previous surgeries you have had.  Medical conditions you have.  Possibility of pregnancy, if this applies. RISKS AND COMPLICATIONS Generally, pacemaker implantation is a safe procedure. However, problems can occur and include:  Bleeding.  Unable to place the pacemaker under local sedation.  Infection. BEFORE THE PROCEDURE  You will have blood work drawn before the procedure.  Do not use any tobacco products including cigarettes, chewing tobacco, or electronic cigarettes. If you need help quitting, ask your health care provider.  Do not eat or drink anything after midnight on the night before the procedure or as directed by your health care provider.  Ask your health care provider about:  Changing or stopping your regular medicines. This is especially important if you are taking diabetes medicines or blood thinners.  Taking medicines such as aspirin and ibuprofen. These medicines can thin your blood. Do not take these medicines before your procedure if your health care provider asks you not  to.  Ask your health care provider if you can take a sip of water with any approved medicines the morning of the procedure. PROCEDURE  The surgery to place a pacemaker is considered a minimally invasive surgical procedure. It is done under a local anesthetic, which is an injection at the incision site that makes the skin numb. You are also given sedation and pain medicine that makes you drowsy during the procedure.   An intravenous line (IV) will be started in your hand or arm so sedation and pain medicine can be given during the pacemaker procedure.  A numbing medicine will be injected into the skin where the pacemaker is to be placed. A small incision will then be made into the skin. The pacemaker is usually placed under the skin near the collarbone.  After the incision has been made, the leads will be inserted into a large vein and guided into the heart using X-ray.  Using the same incision that was used to place the leads, a small pocket will be created under the skin to hold the pulse generator. The leads will then be connected to the pulse generator.  The incision site will then be closed. A bandage (dressing) is placed over the pacemaker site. The dressing is removed 24-48 hours afterward. AFTER THE PROCEDURE  You will be taken to a recovery area after the pacemaker implant. Your vital signs such as blood pressure, heart rate, breathing, and oxygen levels will be monitored.  A chest X-ray will be done after the pacemaker has been implanted. This is to make sure the pacemaker and leads are in the correct place.   This information is not intended to replace advice given to you by your health care provider. Make sure you discuss any questions you have with your health care provider.   Document Released: 02/10/2002 Document Revised: 03/13/2014 Document Reviewed: 06/27/2011 Elsevier Interactive Patient Education [employer].

## 2015-02-16 ENCOUNTER — Emergency Department (HOSPITAL_COMMUNITY): Payer: Medicare Other

## 2015-02-16 ENCOUNTER — Telehealth: Payer: Self-pay | Admitting: Internal Medicine

## 2015-02-16 ENCOUNTER — Encounter (HOSPITAL_COMMUNITY): Payer: Self-pay | Admitting: *Deleted

## 2015-02-16 ENCOUNTER — Inpatient Hospital Stay (HOSPITAL_COMMUNITY)
Admission: EM | Admit: 2015-02-16 | Discharge: 2015-02-18 | DRG: 244 | Disposition: A | Discharge status: Home / Self Care | Payer: Medicare Other | Attending: Cardiovascular Disease | Admitting: Cardiovascular Disease

## 2015-02-16 DIAGNOSIS — Z8673 Personal history of transient ischemic attack (TIA), and cerebral infarction without residual deficits: Secondary | ICD-10-CM

## 2015-02-16 DIAGNOSIS — E669 Obesity, unspecified: Secondary | ICD-10-CM | POA: Diagnosis present

## 2015-02-16 DIAGNOSIS — I495 Sick sinus syndrome: Principal | ICD-10-CM | POA: Diagnosis present

## 2015-02-16 DIAGNOSIS — N289 Disorder of kidney and ureter, unspecified: Secondary | ICD-10-CM | POA: Diagnosis present

## 2015-02-16 DIAGNOSIS — Z7901 Long term (current) use of anticoagulants: Secondary | ICD-10-CM

## 2015-02-16 DIAGNOSIS — J449 Chronic obstructive pulmonary disease, unspecified: Secondary | ICD-10-CM | POA: Diagnosis present

## 2015-02-16 DIAGNOSIS — I10 Essential (primary) hypertension: Secondary | ICD-10-CM | POA: Diagnosis not present

## 2015-02-16 DIAGNOSIS — R001 Bradycardia, unspecified: Secondary | ICD-10-CM | POA: Diagnosis not present

## 2015-02-16 DIAGNOSIS — K219 Gastro-esophageal reflux disease without esophagitis: Secondary | ICD-10-CM | POA: Diagnosis present

## 2015-02-16 DIAGNOSIS — R0789 Other chest pain: Secondary | ICD-10-CM | POA: Diagnosis not present

## 2015-02-16 DIAGNOSIS — R531 Weakness: Secondary | ICD-10-CM | POA: Diagnosis present

## 2015-02-16 DIAGNOSIS — R0602 Shortness of breath: Secondary | ICD-10-CM | POA: Diagnosis not present

## 2015-02-16 DIAGNOSIS — I480 Paroxysmal atrial fibrillation: Secondary | ICD-10-CM | POA: Diagnosis present

## 2015-02-16 DIAGNOSIS — R42 Dizziness and giddiness: Secondary | ICD-10-CM | POA: Diagnosis present

## 2015-02-16 DIAGNOSIS — R55 Syncope and collapse: Secondary | ICD-10-CM | POA: Diagnosis not present

## 2015-02-16 DIAGNOSIS — Z950 Presence of cardiac pacemaker: Secondary | ICD-10-CM

## 2015-02-16 DIAGNOSIS — G4733 Obstructive sleep apnea (adult) (pediatric): Secondary | ICD-10-CM | POA: Diagnosis present

## 2015-02-16 DIAGNOSIS — Z6835 Body mass index (BMI) 35.0-35.9, adult: Secondary | ICD-10-CM

## 2015-02-16 DIAGNOSIS — R0600 Dyspnea, unspecified: Secondary | ICD-10-CM | POA: Diagnosis not present

## 2015-02-16 DIAGNOSIS — D649 Anemia, unspecified: Secondary | ICD-10-CM | POA: Diagnosis present

## 2015-02-16 LAB — I-STAT TROPONIN, ED: Troponin i, poc: 0 ng/mL (ref 0.00–0.08)

## 2015-02-16 LAB — BASIC METABOLIC PANEL
ANION GAP: 9 (ref 5–15)
BUN: 21 mg/dL — ABNORMAL HIGH (ref 6–20)
CO2: 30 mmol/L (ref 22–32)
Calcium: 8.7 mg/dL — ABNORMAL LOW (ref 8.9–10.3)
Chloride: 99 mmol/L — ABNORMAL LOW (ref 101–111)
Creatinine, Ser: 1.28 mg/dL — ABNORMAL HIGH (ref 0.44–1.00)
GFR, EST AFRICAN AMERICAN: 44 mL/min — AB (ref >60)
GFR, EST NON AFRICAN AMERICAN: 38 mL/min — AB (ref >60)
GLUCOSE: 118 mg/dL — AB (ref 65–99)
POTASSIUM: 4 mmol/L (ref 3.5–5.1)
Sodium: 138 mmol/L (ref 135–145)

## 2015-02-16 LAB — CBC
HEMATOCRIT: 35.5 % — AB (ref 36.0–46.0)
Hemoglobin: 11.3 g/dL — ABNORMAL LOW (ref 12.0–15.0)
MCH: 30.1 pg (ref 26.0–34.0)
MCHC: 31.8 g/dL (ref 30.0–36.0)
MCV: 94.4 fL (ref 78.0–100.0)
Platelets: 230 10*3/uL (ref 150–400)
RBC: 3.76 MIL/uL — AB (ref 3.87–5.11)
RDW: 16 % — ABNORMAL HIGH (ref 11.5–15.5)
WBC: 7.3 10*3/uL (ref 4.0–10.5)

## 2015-02-16 LAB — PROTIME-INR
INR: 1.81 — AB (ref 0.00–1.49)
PROTHROMBIN TIME: 21 s — AB (ref 11.6–15.2)

## 2015-02-16 MED ORDER — FAMOTIDINE 20 MG PO TABS
20.0000 mg | ORAL_TABLET | Freq: Every day | ORAL | Status: DC
  Administered 2015-02-17 – 2015-02-18 (×2): 20 mg via ORAL
  Filled 2015-02-16 (×2): qty 1

## 2015-02-16 MED ORDER — METOPROLOL TARTRATE 25 MG PO TABS
25.0000 mg | ORAL_TABLET | Freq: Two times a day (BID) | ORAL | Status: DC
  Administered 2015-02-16 – 2015-02-18 (×4): 25 mg via ORAL
  Filled 2015-02-16 (×4): qty 1

## 2015-02-16 MED ORDER — ROSUVASTATIN CALCIUM 40 MG PO TABS
20.0000 mg | ORAL_TABLET | Freq: Every day | ORAL | Status: DC
  Administered 2015-02-17 – 2015-02-18 (×2): 20 mg via ORAL
  Filled 2015-02-16 (×2): qty 1

## 2015-02-16 MED ORDER — ONDANSETRON HCL 4 MG/2ML IJ SOLN: 4.0000 mg | Freq: Four times a day (QID) | INTRAMUSCULAR | Status: DC | PRN

## 2015-02-16 MED ORDER — ACETAMINOPHEN 325 MG PO TABS
650.0000 mg | ORAL_TABLET | ORAL | Status: DC | PRN
  Administered 2015-02-17: 650 mg via ORAL
  Filled 2015-02-16 (×2): qty 2

## 2015-02-16 MED ORDER — MAGNESIUM 100 MG PO CAPS: 1.0000 | ORAL_CAPSULE | Freq: Two times a day (BID) | ORAL | Status: DC

## 2015-02-16 NOTE — ED Notes (Signed)
Pt arrives via EMS from home. At 1430 became suddenly short of breath, lightheaded, dizzy. Denies chest pain, LOC or fall. Hx COPD, afid with surgically implanted cardiac monitor, scheduled for a pacemaker December 28th.

## 2015-02-16 NOTE — ED Provider Notes (Signed)
CSN: [id number]     Arrival date & time 02/16/15  1712 History   First MD Initiated Contact with Patient 02/16/15 1718     Chief Complaint  Patient presents with  . Shortness of Breath     (Consider location/radiation/quality/duration/timing/severity/associated sxs/prior Treatment) HPI Comments: 81 year old female with history of high blood pressure, atrial fibrillation, lifelong anticoagulation, GI bleed, stroke, sick sinus syndrome, pacemaker scheduled and of December with Dr. Rayann Heman presents with shortness of breath, lightheadedness, dizziness. Patient denies blood clot history, recent surgeries or unilateral leg swelling. History of COPD and ablation. Patient's symptoms have improved however still has mild symptoms. No oxygen at home. No fevers chills or cough. Patient feels similar to previous episode that they caught on her monitor however lasted longer.  Patient is a 82 y.o. female presenting with shortness of breath. The history is provided by the patient.  Shortness of Breath Associated symptoms: diaphoresis   Associated symptoms: no abdominal pain, no chest pain, no fever, no headaches, no neck pain, no rash and no vomiting     Past Medical History  Diagnosis Date  . HTN (hypertension)   . PAF (paroxysmal atrial fibrillation) ([place])     s/p ablation 07/29/09  . COPD (chronic obstructive pulmonary disease) ([place])   . GERD (gastroesophageal reflux disease)   . Obesity   . Stroke ([place]) 6/09  . Anginal pain ([place]) 2011    had to take nitro   . OSA (obstructive sleep apnea)     cpap at home  . Heart murmur   . Anemia   . Hx of echocardiogram     Echo (12/15):  Mild LVH, EF 60-65%, mild MR, mod LAE (LA 49 mm), PASP 43 mmHg   Past Surgical History  Procedure Laterality Date  . Atrial ablation surgery      s/p ablation for afib 07/29/09  . Abdominal hysterectomy    . Esophagogastroduodenoscopy  01/24/2012    Procedure: ESOPHAGOGASTRODUODENOSCOPY (EGD);  Surgeon: Jeryl Columbia,  MD;  Location: [facility] ENDOSCOPY;  Service: Endoscopy;  Laterality: N/A;  . Loop recorder implant N/A 03/12/2014    Procedure: LOOP RECORDER IMPLANT;  Surgeon: Thompson Grayer, MD;  Location: [facility] CATH LAB;  Service: Cardiovascular;  Laterality: N/A;   Family History  Problem Relation Age of Onset  . Breast cancer    . Brain cancer    . Breast cancer Mother   . Brain cancer Father   . Heart attack Neg Hx   . Stroke Maternal Aunt   . Hypertension Sister    Social History  Substance Use Topics  . Smoking status: Never Smoker   . Smokeless tobacco: None  . Alcohol Use: No   OB History    No data available     Review of Systems  Constitutional: Positive for diaphoresis and appetite change. Negative for fever and chills.  HENT: Negative for congestion.   Eyes: Negative for visual disturbance.  Respiratory: Positive for shortness of breath.   Cardiovascular: Negative for chest pain and leg swelling.  Gastrointestinal: Negative for vomiting and abdominal pain.  Genitourinary: Negative for dysuria and flank pain.  Musculoskeletal: Negative for back pain, neck pain and neck stiffness.  Skin: Negative for rash.  Neurological: Positive for dizziness and light-headedness. Negative for headaches.      Allergies  Albuterol  Home Medications   Prior to Admission medications   Medication Sig Start Date End Date Taking? Authorizing Provider  Cholecalciferol (VITAMIN D PO) Take 2 capsules by mouth daily.  Yes Historical Provider, MD  Coenzyme Q10 (COQ10 PO) Take 1 capsule by mouth daily.   Yes Historical Provider, MD  furosemide (LASIX) 40 MG tablet Take 1 tablet (40 mg total) by mouth 2 (two) times daily. 10/21/14  Yes Jettie Booze, MD  KRILL OIL OMEGA-3 PO Take 1 capsule by mouth twice daily.   Yes Historical Provider, MD  Magnesium 100 MG CAPS Take 1 capsule by mouth 2 (two) times daily.    Yes Historical Provider, MD  metoprolol tartrate (LOPRESSOR) 25 MG tablet TAKE 1 TABLET BY  MOUTH TWICE DAILY.  MAY TAKE ONE EXTRA 25MG  TABLET DAILY IF YOU FEEL YOUR HEART IS OUT OF RHYTHM.   Yes Historical Provider, MD  potassium chloride (K-DUR) 10 MEQ tablet Take 10 mEq by mouth daily.   Yes Historical Provider, MD  ranitidine (ZANTAC) 150 MG capsule Take 150 mg by mouth daily as needed for heartburn.    Yes Historical Provider, MD  rosuvastatin (CRESTOR) 20 MG tablet Take 20 mg by mouth daily.   Yes Historical Provider, MD  warfarin (COUMADIN) 1 MG tablet Take 1 mg by mouth 2 (two) times a week. Wednesday and Sunday takes to 4 mg dose   Yes Historical Provider, MD  warfarin (COUMADIN) 3 MG tablet Take 3 mg by mouth daily. Takes an additonal 1 mg on Wednesday and Sunday. Entered in computer.   Yes Historical Provider, MD   BP 134/57 mmHg  Pulse 76  Temp(Src) 98.4 F (36.9 C) (Oral)  Resp 15  Ht 5\' 2"  (1.575 m)  Wt 195 lb (88.451 kg)  BMI 35.66 kg/m2  SpO2 96% Physical Exam  Constitutional: She is oriented to person, place, and time. She appears well-developed and well-nourished.  HENT:  Head: Normocephalic and atraumatic.  Mild dry mucous membranes  Eyes: Right eye exhibits no discharge. Left eye exhibits no discharge.  Neck: Normal range of motion. Neck supple. No tracheal deviation present.  Cardiovascular: Normal rate and regular rhythm.   Pulmonary/Chest: Effort normal and breath sounds normal.  Abdominal: Soft. She exhibits no distension. There is no tenderness. There is no guarding.  Musculoskeletal: She exhibits no edema (no unilateral swelling or calf tenderness).  Neurological: She is alert and oriented to person, place, and time. No cranial nerve deficit.  Skin: Skin is warm. No rash noted.  Psychiatric: She has a normal mood and affect.  Nursing note and vitals reviewed.   ED Course  Procedures (including critical care time) Labs Review Labs Reviewed  CBC - Abnormal; Notable for the following:    RBC 3.76 (*)    Hemoglobin 11.3 (*)    HCT 35.5 (*)     RDW 16.0 (*)    All other components within normal limits  BASIC METABOLIC PANEL - Abnormal; Notable for the following:    Chloride 99 (*)    Glucose, Bld 118 (*)    BUN 21 (*)    Creatinine, Ser 1.28 (*)    Calcium 8.7 (*)    GFR calc non Af Amer 38 (*)    GFR calc Af Amer 44 (*)    All other components within normal limits  PROTIME-INR - Abnormal; Notable for the following:    Prothrombin Time 21.0 (*)    INR 1.81 (*)    All other components within normal limits  I-STAT TROPOININ, ED  Randolm Idol, ED    Imaging Review Dg Chest 2 View  02/16/2015  CLINICAL DATA:  Dyspnea today, history COPD, stroke, paroxysmal  atrial fibrillation EXAM: CHEST  2 VIEW COMPARISON:  05/12/2012 FINDINGS: Minimal enlargement of cardiac silhouette. Tortuous thoracic aorta. Stable mediastinal contours and pulmonary vascularity. Bronchitic changes without infiltrate, pleural effusion or pneumothorax. Bones unremarkable. IMPRESSION: Bronchitic changes. Enlargement of cardiac silhouette. No acute abnormalities. Electronically Signed   By: Lavonia Dana M.D.   On: 02/16/2015 18:49   I have personally reviewed and evaluated these images and lab results as part of my medical decision-making.   EKG Interpretation   Date/Time:  Tuesday February 16 2015 17:15:25 EST Ventricular Rate:  82 PR Interval:  206 QRS Duration: 94 QT Interval:  390 QTC Calculation: 455 R Axis:   0 Text Interpretation:  Sinus rhythm Anterior infarct, old Confirmed by  Tedric Leeth  MD, Lakysha Kossman ([id number]) on 02/16/2015 6:17:58 PM      MDM   Final diagnoses:  None   Patient presents after episode of shortness of breath, lightheaded and dizziness, patient's been followed by cardiology closely with plan for pacemaker for sick sinus syndrome. Plan for cardiac screen and cardiology consult. Patient denies classic blood clot risk factors and is on Coumadin INR pending.  Discussed with cardiology in the ER, plan for admission.  The patients  results and plan were reviewed and discussed.   Any x-rays performed were independently reviewed by myself.   Differential diagnosis were considered with the presenting HPI.  Medications - No data to display  Filed Vitals:   02/16/15 1830 02/16/15 1900 02/16/15 1915 02/16/15 2000  BP: 130/48 157/61 128/77 134/57  Pulse: 87 82 76 76  Temp:      TempSrc:      Resp: 18 17 16 15   Height:      Weight:      SpO2: 87% 100% 98% 96%    Final diagnoses:  None    Admission/ observation were discussed with the admitting physician, patient and/or family and they are comfortable with the plan.     Elnora Morrison, MD 02/16/15 2035

## 2015-02-16 NOTE — Telephone Encounter (Signed)
°  New Prob   Pt states she on the way to the ED as she states "my heart isnt working right". Calling to notify office.

## 2015-02-16 NOTE — Telephone Encounter (Signed)
I placed call to pt to make sure she has contacted EMS.  Neighbor answered phone and stated EMS is with pt.  I told him pt should go by EMS to hospital if she is not feeling well. [name] notified.

## 2015-02-16 NOTE — H&P (Signed)
Patient ID: Lynn Dunn MRN: [id number], DOB/AGE: 1930-07-01   Admit date: 02/16/2015   Primary Physician: Abigail Miyamoto, MD Primary Cardiologist: Dr. Eston Mould. [place]  Pt. Profile:  Lynn Dunn is a 81 y.o. female with a history of HTN, PAF s/p ablation (2011) on coumadin, CVA, OSA on CPAP and COPD who presented to the [facility] ED today after an episode of pre syncope.   [facility] when she felt weak, diaphoretic and dizzy. A friend put her in a wheelchair drove her home. She continue to feel dizzy and it was difficult to catch her breath. From home she called 911. During her stay in the ED she has felt dizzy, sob and had some periodic chest pains. She gets chest pain 3-4 xs a week for the past couple of years. No LE edema, orthopnea or PND. She continues to feel "not good."  She was seen by Dr. Rayann Heman yesterday for an episode of syncope last week. She reports a very strong sensation of passing out and was disoriented afterwards. She does not think that she passed out fully though her ILR interrogation revealed a 9 second pause corresponding to the event. AF burden is 0.7%. Episodic afib/ atrial flutter with RVR is noted. She felt like she was shot by a stun gun. He felt she had symptomatic sinus bradycardia with no reversible causes (required AV nodal agents due to AF with elevated V rates). He recommend pacemaker implantation at this time.This was set for 03/03/15    Problem List  Past Medical History  Diagnosis Date  . HTN (hypertension)   . PAF (paroxysmal atrial fibrillation) ([place])     s/p ablation 07/29/09  . COPD (chronic obstructive pulmonary disease) ([place])   . GERD (gastroesophageal reflux disease)   . Obesity   . Stroke ([place]) 6/09  . Anginal pain ([place]) 2011    had to take nitro   . OSA (obstructive sleep apnea)     cpap at home  . Heart murmur   . Anemia   . Hx of echocardiogram     Echo (12/15):  Mild LVH, EF  60-65%, mild MR, mod LAE (LA 49 mm), PASP 43 mmHg    Past Surgical History  Procedure Laterality Date  . Atrial ablation surgery      s/p ablation for afib 07/29/09  . Abdominal hysterectomy    . Esophagogastroduodenoscopy  01/24/2012    Procedure: ESOPHAGOGASTRODUODENOSCOPY (EGD);  Surgeon: Jeryl Columbia, MD;  Location: [facility] ENDOSCOPY;  Service: Endoscopy;  Laterality: N/A;  . Loop recorder implant N/A 03/12/2014    Procedure: LOOP RECORDER IMPLANT;  Surgeon: Thompson Grayer, MD;  Location: [facility] CATH LAB;  Service: Cardiovascular;  Laterality: N/A;     Allergies  Allergies  Allergen Reactions  . Albuterol Palpitations    Atrial fibrillation     Home Medications  Prior to Admission medications   Medication Sig Start Date End Date Taking? Authorizing Provider  Cholecalciferol (VITAMIN D PO) Take 2 capsules by mouth daily.    Yes Historical Provider, MD  Coenzyme Q10 (COQ10 PO) Take 1 capsule by mouth daily.   Yes Historical Provider, MD  furosemide (LASIX) 40 MG tablet Take 1 tablet (40 mg total) by mouth 2 (two) times daily. 10/21/14  Yes Jettie Booze, MD  KRILL OIL OMEGA-3 PO Take 1 capsule by mouth twice daily.   Yes Historical Provider, MD  Magnesium 100 MG CAPS Take 1 capsule  by mouth 2 (two) times daily.    Yes Historical Provider, MD  metoprolol tartrate (LOPRESSOR) 25 MG tablet TAKE 1 TABLET BY MOUTH TWICE DAILY.  MAY TAKE ONE EXTRA 25MG  TABLET DAILY IF YOU FEEL YOUR HEART IS OUT OF RHYTHM.   Yes Historical Provider, MD  potassium chloride (K-DUR) 10 MEQ tablet Take 10 mEq by mouth daily.   Yes Historical Provider, MD  ranitidine (ZANTAC) 150 MG capsule Take 150 mg by mouth daily as needed for heartburn.    Yes Historical Provider, MD  rosuvastatin (CRESTOR) 20 MG tablet Take 20 mg by mouth daily.   Yes Historical Provider, MD  warfarin (COUMADIN) 1 MG tablet Take 1 mg by mouth 2 (two) times a week. Wednesday and Sunday takes to 4 mg dose   Yes Historical Provider, MD  warfarin  (COUMADIN) 3 MG tablet Take 3 mg by mouth daily. Takes an additonal 1 mg on Wednesday and Sunday. Entered in computer.   Yes Historical Provider, MD    Family History  Family History  Problem Relation Age of Onset  . Breast cancer    . Brain cancer    . Breast cancer Mother   . Brain cancer Father   . Heart attack Neg Hx   . Stroke Maternal Aunt   . Hypertension Sister    Family Status  Relation Status Death Age  . Mother Deceased   . Father Deceased      Social History  Social History   Social History  . Marital Status: Married    Spouse Name: N/A  . Number of Children: N/A  . Years of Education: N/A   Occupational History  . Not on file.   Social History Main Topics  . Smoking status: Never Smoker   . Smokeless tobacco: Not on file  . Alcohol Use: No  . Drug Use: No  . Sexual Activity: Not on file   Other Topics Concern  . Not on file   Social History Narrative      All other systems reviewed and are otherwise negative except as noted above.  Physical Exam  Blood pressure 128/77, pulse 76, temperature 98.4 F (36.9 C), temperature source Oral, resp. rate 16, height 5\' 2"  (1.575 m), weight 195 lb (88.451 kg), SpO2 98 %.  General: Pleasant, NAD Psych: Normal affect. Neuro: Alert and oriented X 3. Moves all extremities spontaneously. HEENT: Normal  Neck: Supple without bruits or JVD. Lungs:  Resp regular and unlabored, CTA. Heart: RRR no s3, s4, or murmurs. Abdomen: Soft, non-tender, non-distended, BS + x 4.  Extremities: No clubbing, cyanosis or edema. DP/PT/Radials 2+ and equal bilaterally.  Labs  No results for input(s): CKTOTAL, CKMB, TROPONINI in the last 72 hours. Lab Results  Component Value Date   WBC 7.3 02/16/2015   HGB 11.3* 02/16/2015   HCT 35.5* 02/16/2015   MCV 94.4 02/16/2015   PLT 230 02/16/2015    Recent Labs Lab 02/16/15 1758  NA 138  K 4.0  CL 99*  CO2 30  BUN 21*  CREATININE 1.28*  CALCIUM 8.7*  GLUCOSE 118*    Lab Results  Component Value Date   CHOL 170 08/31/2014   HDL 51.80 08/31/2014   LDLCALC 86 08/31/2014   TRIG 162.0* 08/31/2014      Radiology/Studies  Dg Chest 2 View  02/16/2015  CLINICAL DATA:  Dyspnea today, history COPD, stroke, paroxysmal atrial fibrillation EXAM: CHEST  2 VIEW COMPARISON:  05/12/2012 FINDINGS: Minimal enlargement of cardiac silhouette. Tortuous  thoracic aorta. Stable mediastinal contours and pulmonary vascularity. Bronchitic changes without infiltrate, pleural effusion or pneumothorax. Bones unremarkable. IMPRESSION: Bronchitic changes. Enlargement of cardiac silhouette. No acute abnormalities. Electronically Signed   By: Lavonia Dana M.D.   On: 02/16/2015 18:49    ECG  NSR 82  ASSESSMENT AND PLAN  BABBETTE WYSONG is a 81 y.o. female with a history of HTN, PAF s/p ablation (2011) on coumadin, CVA, OSA on CPAP and COPD who presented to the [place] ED today after an episode of pre syncope.   Pre-syncopal episode- evaluation in the ED revealed NSR with no pauses or arrhyhtmia. Loop interrogation revealed no events. Patient very worried and continues to have symptoms despite normal work up. We will keep for observation over night. Will have EP see tomorrow to see if pacemaker implantation can be done sooner.    SignedEileen Stanford, PA-C 02/16/2015, 7:22 PM  Pager [phone]  I have seen and examined the patient along with K. Grandville Silos, [place].  I have reviewed the chart, notes and new data.  I agree with PA's note.  Key new complaints: she still feels weak and unwell; she appears terrified that her heart would just "stop" Key examination changes: 1-2/6 aortic sclerosis murmur, otherwise normal CV exam Key new findings / data: no arrhythmia recorded on ILR since her last office visit, including to arrhythmia today. Normal nuclear perfusion study one year ago. Nearly normal echo one week ago (moderately dilated left atrium, moderate diastolic dysfunction, mildly  elevated PA pressure).   PLAN:  Observe overnight and repeat cardiac enzymes. INR 1.9. She would like to move up device implantation time, but this may not be logistically feasible. At this point, pacemaker implantation appears necessary, but not emergent. Will find ou tomorrow.  Sanda Klein, MD, [place] [phone] 02/16/2015, 7:50 PM

## 2015-02-17 ENCOUNTER — Encounter (HOSPITAL_COMMUNITY)
Admission: EM | Disposition: A | Discharge status: Home / Self Care | Payer: Self-pay | Source: Home / Self Care | Attending: Cardiovascular Disease

## 2015-02-17 ENCOUNTER — Ambulatory Visit (HOSPITAL_COMMUNITY): Admit: 2015-02-17 | Payer: Self-pay | Admitting: Internal Medicine

## 2015-02-17 DIAGNOSIS — K219 Gastro-esophageal reflux disease without esophagitis: Secondary | ICD-10-CM | POA: Diagnosis present

## 2015-02-17 DIAGNOSIS — R531 Weakness: Secondary | ICD-10-CM | POA: Diagnosis present

## 2015-02-17 DIAGNOSIS — I480 Paroxysmal atrial fibrillation: Secondary | ICD-10-CM | POA: Diagnosis not present

## 2015-02-17 DIAGNOSIS — Z8673 Personal history of transient ischemic attack (TIA), and cerebral infarction without residual deficits: Secondary | ICD-10-CM | POA: Diagnosis not present

## 2015-02-17 DIAGNOSIS — R42 Dizziness and giddiness: Secondary | ICD-10-CM | POA: Diagnosis present

## 2015-02-17 DIAGNOSIS — D649 Anemia, unspecified: Secondary | ICD-10-CM | POA: Diagnosis present

## 2015-02-17 DIAGNOSIS — N289 Disorder of kidney and ureter, unspecified: Secondary | ICD-10-CM | POA: Diagnosis present

## 2015-02-17 DIAGNOSIS — Z950 Presence of cardiac pacemaker: Secondary | ICD-10-CM | POA: Diagnosis not present

## 2015-02-17 DIAGNOSIS — Z6835 Body mass index (BMI) 35.0-35.9, adult: Secondary | ICD-10-CM | POA: Diagnosis not present

## 2015-02-17 DIAGNOSIS — G4733 Obstructive sleep apnea (adult) (pediatric): Secondary | ICD-10-CM | POA: Diagnosis present

## 2015-02-17 DIAGNOSIS — J449 Chronic obstructive pulmonary disease, unspecified: Secondary | ICD-10-CM | POA: Diagnosis not present

## 2015-02-17 DIAGNOSIS — Z7901 Long term (current) use of anticoagulants: Secondary | ICD-10-CM | POA: Diagnosis not present

## 2015-02-17 DIAGNOSIS — I10 Essential (primary) hypertension: Secondary | ICD-10-CM | POA: Diagnosis present

## 2015-02-17 DIAGNOSIS — I495 Sick sinus syndrome: Principal | ICD-10-CM

## 2015-02-17 DIAGNOSIS — R001 Bradycardia, unspecified: Secondary | ICD-10-CM | POA: Diagnosis not present

## 2015-02-17 DIAGNOSIS — R55 Syncope and collapse: Secondary | ICD-10-CM | POA: Diagnosis present

## 2015-02-17 DIAGNOSIS — E669 Obesity, unspecified: Secondary | ICD-10-CM | POA: Diagnosis present

## 2015-02-17 HISTORY — PX: EP IMPLANTABLE DEVICE: SHX172B

## 2015-02-17 LAB — BASIC METABOLIC PANEL
Anion gap: 7 (ref 5–15)
BUN: 15 mg/dL (ref 6–20)
CALCIUM: 8.9 mg/dL (ref 8.9–10.3)
CO2: 28 mmol/L (ref 22–32)
CREATININE: 1.02 mg/dL — AB (ref 0.44–1.00)
Chloride: 102 mmol/L (ref 101–111)
GFR calc Af Amer: 58 mL/min — ABNORMAL LOW (ref >60)
GFR, EST NON AFRICAN AMERICAN: 50 mL/min — AB (ref >60)
Glucose, Bld: 157 mg/dL — ABNORMAL HIGH (ref 65–99)
Potassium: 4 mmol/L (ref 3.5–5.1)
SODIUM: 137 mmol/L (ref 135–145)

## 2015-02-17 LAB — PROTIME-INR
INR: 1.69 — ABNORMAL HIGH (ref 0.00–1.49)
PROTHROMBIN TIME: 19.9 s — AB (ref 11.6–15.2)

## 2015-02-17 SURGERY — PACEMAKER IMPLANT

## 2015-02-17 MED ORDER — SODIUM CHLORIDE 0.9 % IR SOLN
80.0000 mg | Status: AC
  Administered 2015-02-17: 80 mg
  Filled 2015-02-17: qty 2

## 2015-02-17 MED ORDER — ONDANSETRON HCL 4 MG/2ML IJ SOLN: 4.0000 mg | Freq: Four times a day (QID) | INTRAMUSCULAR | Status: DC | PRN

## 2015-02-17 MED ORDER — WARFARIN SODIUM 5 MG PO TABS
5.0000 mg | ORAL_TABLET | Freq: Once | ORAL | Status: DC
  Filled 2015-02-17: qty 1

## 2015-02-17 MED ORDER — SODIUM CHLORIDE 0.9 % IV SOLN
INTRAVENOUS | Status: DC | PRN
  Administered 2015-02-17: 10 mL/h via INTRAVENOUS

## 2015-02-17 MED ORDER — FENTANYL CITRATE (PF) 100 MCG/2ML IJ SOLN
INTRAMUSCULAR | Status: AC
  Filled 2015-02-17: qty 2

## 2015-02-17 MED ORDER — IOHEXOL 350 MG/ML SOLN
INTRAVENOUS | Status: DC | PRN
  Administered 2015-02-17: 10 mL via INTRAVENOUS

## 2015-02-17 MED ORDER — LIDOCAINE HCL (PF) 1 % IJ SOLN
INTRAMUSCULAR | Status: DC | PRN
  Administered 2015-02-17: 59 mL

## 2015-02-17 MED ORDER — CHLORHEXIDINE GLUCONATE 4 % EX LIQD: 60.0000 mL | Freq: Once | CUTANEOUS | Status: DC

## 2015-02-17 MED ORDER — MIDAZOLAM HCL 5 MG/5ML IJ SOLN
INTRAMUSCULAR | Status: DC | PRN
  Administered 2015-02-17: 2 mg via INTRAVENOUS
  Administered 2015-02-17 (×4): 1 mg via INTRAVENOUS

## 2015-02-17 MED ORDER — WARFARIN - PHARMACIST DOSING INPATIENT: Freq: Every day | Status: DC

## 2015-02-17 MED ORDER — SODIUM CHLORIDE 0.9 % IR SOLN
Status: AC
  Filled 2015-02-17: qty 2

## 2015-02-17 MED ORDER — ACETAMINOPHEN 325 MG PO TABS
325.0000 mg | ORAL_TABLET | ORAL | Status: DC | PRN
  Administered 2015-02-17: 325 mg via ORAL
  Administered 2015-02-17: 650 mg via ORAL
  Filled 2015-02-17: qty 2

## 2015-02-17 MED ORDER — MIDAZOLAM HCL 5 MG/5ML IJ SOLN
INTRAMUSCULAR | Status: AC
  Filled 2015-02-17: qty 5

## 2015-02-17 MED ORDER — CEFAZOLIN SODIUM-DEXTROSE 2-3 GM-% IV SOLR
INTRAVENOUS | Status: AC
  Filled 2015-02-17: qty 50

## 2015-02-17 MED ORDER — CEFAZOLIN SODIUM-DEXTROSE 2-3 GM-% IV SOLR
2.0000 g | INTRAVENOUS | Status: AC
  Administered 2015-02-17: 2 g via INTRAVENOUS

## 2015-02-17 MED ORDER — FENTANYL CITRATE (PF) 100 MCG/2ML IJ SOLN
INTRAMUSCULAR | Status: DC | PRN
  Administered 2015-02-17 (×3): 12.5 ug via INTRAVENOUS
  Administered 2015-02-17: 25 ug via INTRAVENOUS
  Administered 2015-02-17: 12.5 ug via INTRAVENOUS

## 2015-02-17 MED ORDER — HEPARIN (PORCINE) IN NACL 2-0.9 UNIT/ML-% IJ SOLN
INTRAMUSCULAR | Status: DC | PRN
  Administered 2015-02-17: 17:00:00

## 2015-02-17 MED ORDER — SODIUM CHLORIDE 0.9 % IV SOLN: INTRAVENOUS | Status: DC

## 2015-02-17 MED ORDER — CEFAZOLIN SODIUM 1-5 GM-% IV SOLN
1.0000 g | Freq: Four times a day (QID) | INTRAVENOUS | Status: AC
Start: 2015-02-17 — End: 2015-02-18
  Administered 2015-02-17 – 2015-02-18 (×3): 1 g via INTRAVENOUS
  Filled 2015-02-17 (×3): qty 50

## 2015-02-17 MED ORDER — CEFAZOLIN SODIUM 1-5 GM-% IV SOLN
1.0000 g | Freq: Four times a day (QID) | INTRAVENOUS | Status: DC
  Filled 2015-02-17 (×2): qty 50

## 2015-02-17 MED ORDER — LIDOCAINE HCL (PF) 1 % IJ SOLN
INTRAMUSCULAR | Status: AC
  Filled 2015-02-17: qty 30

## 2015-02-17 MED ORDER — HEPARIN (PORCINE) IN NACL 2-0.9 UNIT/ML-% IJ SOLN
INTRAMUSCULAR | Status: AC
  Filled 2015-02-17: qty 500

## 2015-02-17 SURGICAL SUPPLY — 7 items
CABLE SURGICAL S-101-97-12 (CABLE) ×3 IMPLANT
LEAD CAPSURE NOVUS 45CM (Lead) ×3 IMPLANT
LEAD CAPSURE NOVUS 5076-52CM (Lead) ×3 IMPLANT
PAD DEFIB LIFELINK (PAD) ×3 IMPLANT
PPM ADVISA MRI DR A2DR01 (Pacemaker) ×3 IMPLANT
SHEATH CLASSIC 7F (SHEATH) ×6 IMPLANT
TRAY PACEMAKER INSERTION (PACKS) ×3 IMPLANT

## 2015-02-17 NOTE — Progress Notes (Signed)
ANTICOAGULATION CONSULT NOTE - Initial Consult  Pharmacy Consult for Coumadin Indication: atrial fibrillation  Allergies  Allergen Reactions  . Albuterol Palpitations    Atrial fibrillation    Patient Measurements: Height: 5\' 2"  (157.5 cm) Weight: 195 lb 6.4 oz (88.633 kg) IBW/kg (Calculated) : 50.1  Vital Signs: Temp: 98.1 F (36.7 C) (12/14 0403) Temp Source: Oral (12/14 0403) BP: 115/51 mmHg (12/14 1004) Pulse Rate: 75 (12/14 1004)  Labs:  Recent Labs  02/15/15 1047 02/16/15 1758 02/16/15 1905 02/17/15 0955  HGB 11.8* 11.3*  --   --   HCT 35.7* 35.5*  --   --   PLT 248 230  --   --   LABPROT 22.2*  --  21.0* 19.9*  INR 1.92*  --  1.81* 1.69*  CREATININE 0.93* 1.28*  --  1.02*    Estimated Creatinine Clearance: 44 mL/min (by C-G formula based on Cr of 1.02).   Medical History: Past Medical History  Diagnosis Date  . HTN (hypertension)   . PAF (paroxysmal atrial fibrillation) ([place])     s/p ablation 07/29/09  . COPD (chronic obstructive pulmonary disease) ([place])   . GERD (gastroesophageal reflux disease)   . Obesity   . Stroke ([place]) 6/09  . Anginal pain ([place]) 2011    had to take nitro   . OSA (obstructive sleep apnea)     cpap at home  . Heart murmur   . Anemia   . Hx of echocardiogram     Echo (12/15):  Mild LVH, EF 60-65%, mild MR, mod LAE (LA 49 mm), PASP 43 mmHg    Medications:  Scheduled:  .  ceFAZolin (ANCEF) IV  2 g Intravenous To Cath  . chlorhexidine  60 mL Topical Once  . chlorhexidine  60 mL Topical Once  . famotidine  20 mg Oral Daily  . gentamicin irrigation  80 mg Irrigation To Cath  . metoprolol tartrate  25 mg Oral BID  . rosuvastatin  20 mg Oral Daily    Assessment: 81 yo F on Coumadin PTA for hx PAF.  Coumadin doses have been held in anticipation of pacemaker placement scheduled for today.  Will restart Coumadin this evening.  Current INR 1.69.  Goal of Therapy:  INR 2-3 Monitor platelets by anticoagulation protocol: Yes    Plan:  Coumadin 5mg  PO x 1  Daily INR  [employer], Pharm.D., BCPS Clinical Pharmacist Pager [phone] 02/17/2015 2:28 PM

## 2015-02-17 NOTE — H&P (View-Only) (Signed)
SUBJECTIVE: The patient is doing improved today.  At this time, she denies chest pain, shortness of breath, or any new concerns.  CURRENT MEDICATIONS: . famotidine  20 mg Oral Daily  . metoprolol tartrate  25 mg Oral BID  . rosuvastatin  20 mg Oral Daily      OBJECTIVE: Physical Exam: Filed Vitals:   02/16/15 2000 02/16/15 2030 02/16/15 2133 02/17/15 0403  BP: 134/57 119/77 148/49 135/50  Pulse: 76 77 79 69  Temp:   98.5 F (36.9 C) 98.1 F (36.7 C)  TempSrc:   Oral Oral  Resp: 15 13 16 15   Height:   5\' 2"  (1.575 m)   Weight:   195 lb 6.4 oz (88.633 kg)   SpO2: 96% 95% 96% 97%   No intake or output data in the 24 hours ending 02/17/15 0744  Telemetry reveals sinus rhythm  GEN- The patient is obese appearing, alert and oriented x 3 today.   Head- normocephalic, atraumatic Eyes-  Sclera clear, conjunctiva pink Ears- hearing intact Oropharynx- clear Neck- supple  Lungs- Clear to ausculation bilaterally, normal work of breathing Heart- Regular rate and rhythm, no murmurs, rubs or gallops  GI- soft, NT, ND, + BS Extremities- no clubbing, cyanosis, or edema Skin- no rash or lesion Psych- euthymic mood, full affect Neuro- strength and sensation are intact  LABS: Basic Metabolic Panel:  Recent Labs  02/15/15 1047 02/16/15 1758  NA 139 138  K 4.1 4.0  CL 98 99*  CO2 31 30  GLUCOSE 108* 118*  BUN 22 21*  CREATININE 0.93* 1.28*  CALCIUM 9.3 8.7*   CBC:  Recent Labs  02/15/15 1047 02/16/15 1758  WBC 6.6 7.3  NEUTROABS 3.3  --   HGB 11.8* 11.3*  HCT 35.7* 35.5*  MCV 90.8 94.4  PLT 248 230    RADIOLOGY: Dg Chest 2 View 02/16/2015  CLINICAL DATA:  Dyspnea today, history COPD, stroke, paroxysmal atrial fibrillation EXAM: CHEST  2 VIEW COMPARISON:  05/12/2012 FINDINGS: Minimal enlargement of cardiac silhouette. Tortuous thoracic aorta. Stable mediastinal contours and pulmonary vascularity. Bronchitic changes without infiltrate, pleural effusion or  pneumothorax. Bones unremarkable. IMPRESSION: Bronchitic changes. Enlargement of cardiac silhouette. No acute abnormalities. Electronically Signed   By: Lavonia Dana M.D.   On: 02/16/2015 18:49    ASSESSMENT AND PLAN:  Active Problems:   Pre-syncope  1.  Sick sinus syndrome Documented 9 second pause with no reversible causes identified. Outpatient pacemaker implant was planned. The patient presented with dizziness but no further pauses on ILR. Will plan PPM implant this admission. Risks, benefits discussed with patient who wishes to proceed. Will plan with Dr Lovena Le later today.  2.  Paroxysmal atrial fibrillation  CHADS2VASC is 6 - will resume Warfarin today Burden low by recent ILR interrogation  3.  OSA Compliance with CPAP encouraged  4.  HTN Stable No change required today  5.  Obesity Body mass index is 35.73 kg/(m^2). Weight loss encouraged  6.  Acute renal insufficiency Repeat BMET this morning Hydrate today Pt reports decreased po intake recently     Chanetta Marshall, NP 02/17/2015 7:49 AM   EP Attending  Patient seen and examined. She is well known to me from her husband who I took care of. She has a h/o symptomatic bradycardia and postermination pauses out of atrial fibrillation. The risks/benefits/goals/expectations of PPM insertion have been discussed and she wishes to proceed. Will plan to remove her ILR at the time of her procedure.  Carleene Overlie  Fawne Hughley,M.D.

## 2015-02-17 NOTE — Progress Notes (Signed)
SUBJECTIVE: The patient is doing improved today.  At this time, she denies chest pain, shortness of breath, or any new concerns.  CURRENT MEDICATIONS: . famotidine  20 mg Oral Daily  . metoprolol tartrate  25 mg Oral BID  . rosuvastatin  20 mg Oral Daily      OBJECTIVE: Physical Exam: Filed Vitals:   02/16/15 2000 02/16/15 2030 02/16/15 2133 02/17/15 0403  BP: 134/57 119/77 148/49 135/50  Pulse: 76 77 79 69  Temp:   98.5 F (36.9 C) 98.1 F (36.7 C)  TempSrc:   Oral Oral  Resp: 15 13 16 15   Height:   5\' 2"  (1.575 m)   Weight:   195 lb 6.4 oz (88.633 kg)   SpO2: 96% 95% 96% 97%   No intake or output data in the 24 hours ending 02/17/15 0744  Telemetry reveals sinus rhythm  GEN- The patient is obese appearing, alert and oriented x 3 today.   Head- normocephalic, atraumatic Eyes-  Sclera clear, conjunctiva pink Ears- hearing intact Oropharynx- clear Neck- supple  Lungs- Clear to ausculation bilaterally, normal work of breathing Heart- Regular rate and rhythm, no murmurs, rubs or gallops  GI- soft, NT, ND, + BS Extremities- no clubbing, cyanosis, or edema Skin- no rash or lesion Psych- euthymic mood, full affect Neuro- strength and sensation are intact  LABS: Basic Metabolic Panel:  Recent Labs  02/15/15 1047 02/16/15 1758  NA 139 138  K 4.1 4.0  CL 98 99*  CO2 31 30  GLUCOSE 108* 118*  BUN 22 21*  CREATININE 0.93* 1.28*  CALCIUM 9.3 8.7*   CBC:  Recent Labs  02/15/15 1047 02/16/15 1758  WBC 6.6 7.3  NEUTROABS 3.3  --   HGB 11.8* 11.3*  HCT 35.7* 35.5*  MCV 90.8 94.4  PLT 248 230    RADIOLOGY: Dg Chest 2 View 02/16/2015  CLINICAL DATA:  Dyspnea today, history COPD, stroke, paroxysmal atrial fibrillation EXAM: CHEST  2 VIEW COMPARISON:  05/12/2012 FINDINGS: Minimal enlargement of cardiac silhouette. Tortuous thoracic aorta. Stable mediastinal contours and pulmonary vascularity. Bronchitic changes without infiltrate, pleural effusion or  pneumothorax. Bones unremarkable. IMPRESSION: Bronchitic changes. Enlargement of cardiac silhouette. No acute abnormalities. Electronically Signed   By: Lavonia Dana M.D.   On: 02/16/2015 18:49    ASSESSMENT AND PLAN:  Active Problems:   Pre-syncope  1.  Sick sinus syndrome Documented 9 second pause with no reversible causes identified. Outpatient pacemaker implant was planned. The patient presented with dizziness but no further pauses on ILR. Will plan PPM implant this admission. Risks, benefits discussed with patient who wishes to proceed. Will plan with Dr Lovena Le later today.  2.  Paroxysmal atrial fibrillation  CHADS2VASC is 6 - will resume Warfarin today Burden low by recent ILR interrogation  3.  OSA Compliance with CPAP encouraged  4.  HTN Stable No change required today  5.  Obesity Body mass index is 35.73 kg/(m^2). Weight loss encouraged  6.  Acute renal insufficiency Repeat BMET this morning Hydrate today Pt reports decreased po intake recently     Chanetta Marshall, NP 02/17/2015 7:49 AM   EP Attending  Patient seen and examined. She is well known to me from her husband who I took care of. She has a h/o symptomatic bradycardia and postermination pauses out of atrial fibrillation. The risks/benefits/goals/expectations of PPM insertion have been discussed and she wishes to proceed. Will plan to remove her ILR at the time of her procedure.  Carleene Overlie  Taylor,M.D.

## 2015-02-17 NOTE — Interval H&P Note (Signed)
History and Physical Interval Note:  02/17/2015 8:43 AM  Lynn Dunn  has presented today for surgery, with the diagnosis of afib/sss  The various methods of treatment have been discussed with the patient and family. After consideration of risks, benefits and other options for treatment, the patient has consented to  Procedure(s): Pacemaker Implant (N/A) as a surgical intervention .  The patient's history has been reviewed, patient examined, no change in status, stable for surgery.  I have reviewed the patient's chart and labs.  Questions were answered to the patient's satisfaction.     Cristopher Peru

## 2015-02-17 NOTE — Discharge Summary (Signed)
ELECTROPHYSIOLOGY PROCEDURE DISCHARGE SUMMARY    Patient ID: Lynn Dunn,  MRN: [id number], DOB/AGE: March 03, 1931 81 y.o.  Admit date: 02/16/2015 Discharge date: 02/18/2015  Primary Care Physician: Abigail Miyamoto, MD Primary Cardiologist: Irish Lack Electrophysiologist: Allred  Primary Discharge Diagnosis:  Symptomatic post termination pauses up to 9 seconds status post pacemaker implantation this admission  Secondary Discharge Diagnosis:  1.  Paroxysmal atrial fibrillation 2.  Hypertension 3.  Prior CVA 4.  Obesity 5.  OSA 6.  COPD  Allergies  Allergen Reactions  . Albuterol Palpitations    Atrial fibrillation     Procedures This Admission:  1.  Implantation of a MDT dual chamber PPM on 02-17-15 by Dr Lovena Le.  The patient received a MDT PPM with model number 5076 right atrial lead and 5076 right ventricular lead. There were no immediate post procedure complications. 2.  CXR on 02/18/15 demonstrated no pneumothorax status post device implantation.   Brief HPI/Hospital Course:  Lynn Dunn is a 81 y.o. female was referred to electrophysiology in the outpatient setting for consideration of PPM implantation.  Past medical history is as outlined above.  The patient has had symptomatic bradycardia without reversible causes identified. She was seen by Dr Rayann Heman in the office who recommended pacemaker implant. She was scheduled for pacemaker implant but developed recurrent symptoms prior to pacemaker implant and presented to the ER. She was admitted and decision was made to proceed with PPM implant during admission.  Risks, benefits, and alternatives to PPM implantation were reviewed with the patient who wished to proceed. The patient underwent implantation of a MDT dual chamber pacemaker with details as outlined above.  She  was monitored on telemetry overnight which demonstrated sinus rhythm.  Left chest was without hematoma or ecchymosis.  The device was interrogated and  found to be functioning normally.  CXR was obtained and demonstrated no pneumothorax status post device implantation.  Wound care, arm mobility, and restrictions were reviewed with the patient.  The patient was examined and considered stable for discharge to home.    Physical Exam: Filed Vitals:   02/17/15 2000 02/17/15 2100 02/17/15 2200 02/18/15 0627  BP: 160/50 146/59 141/75 127/52  Pulse: 66 72 74 71  Temp:    97.9 F (36.6 C)  TempSrc:    Oral  Resp:    18  Height:      Weight:      SpO2:    98%    GEN- The patient is obese appearing, alert and oriented x 3 today.   HEENT: normocephalic, atraumatic; sclera clear, conjunctiva pink; hearing intact; oropharynx clear; neck supple  Lungs- Clear to ausculation bilaterally, normal work of breathing.  No wheezes, rales, rhonchi Heart- Regular rate and rhythm  GI- soft, non-tender, non-distended, bowel sounds present  Extremities- no clubbing, cyanosis, or edema; DP/PT/radial pulses 2+ bilaterally MS- no significant deformity or atrophy Skin- warm and dry, no rash or lesion, left chest without hematoma/ecchymosis Psych- euthymic mood, full affect Neuro- strength and sensation are intact   Labs:   Lab Results  Component Value Date   WBC 7.3 02/16/2015   HGB 11.3* 02/16/2015   HCT 35.5* 02/16/2015   MCV 94.4 02/16/2015   PLT 230 02/16/2015     Recent Labs Lab 02/17/15 0955  NA 137  K 4.0  CL 102  CO2 28  BUN 15  CREATININE 1.02*  CALCIUM 8.9  GLUCOSE 157*    Discharge Medications:    Medication List  TAKE these medications        COQ10 PO  Take 1 capsule by mouth daily.     furosemide 40 MG tablet  Commonly known as:  LASIX  Take 1 tablet (40 mg total) by mouth 2 (two) times daily.     KRILL OIL OMEGA-3 PO  Take 1 capsule by mouth twice daily.     Magnesium 100 MG Caps  Take 1 capsule by mouth 2 (two) times daily.     metoprolol tartrate 25 MG tablet  Commonly known as:  LOPRESSOR  TAKE 1 TABLET  BY MOUTH TWICE DAILY.  MAY TAKE ONE EXTRA 25MG  TABLET DAILY IF YOU FEEL YOUR HEART IS OUT OF RHYTHM.     potassium chloride 10 MEQ tablet  Commonly known as:  K-DUR  Take 10 mEq by mouth daily.     ranitidine 150 MG capsule  Commonly known as:  ZANTAC  Take 150 mg by mouth daily as needed for heartburn.     rosuvastatin 20 MG tablet  Commonly known as:  CRESTOR  Take 20 mg by mouth daily.     VITAMIN D PO  Take 2 capsules by mouth daily.     warfarin 1 MG tablet  Commonly known as:  COUMADIN  Take 1 mg by mouth 2 (two) times a week. Wednesday and Sunday takes to 4 mg dose     warfarin 3 MG tablet  Commonly known as:  COUMADIN  Take 3 mg by mouth daily. Takes an additonal 1 mg on Wednesday and Sunday. Entered in computer.        Disposition:  Discharge Instructions    Diet - low sodium heart healthy    Complete by:  As directed      Increase activity slowly    Complete by:  As directed           Follow-up Information    Follow up with [facility] On 03/03/2015.   Specialty:  Cardiology   Why:  at 11AM for wound check    Contact information:   [address], [place] [phone]      Follow up with Thompson Grayer, MD On 06/02/2015.   Specialty:  Cardiology   Why:  at 9:30AM   Contact information:   [place] [address] [phone]       Duration of Discharge Encounter: Greater than 30 minutes including physician time.  Signed, Chanetta Marshall, NP 02/18/2015 7:12 AM   EP Attending  Patient seen and examined. Agree with above. [place] for discharge. Her PPM is working normally.   Mikle Bosworth.D.

## 2015-02-18 ENCOUNTER — Inpatient Hospital Stay (HOSPITAL_COMMUNITY): Payer: Medicare Other

## 2015-02-18 ENCOUNTER — Encounter (HOSPITAL_COMMUNITY): Payer: Self-pay | Admitting: Internal Medicine

## 2015-02-18 LAB — PROTIME-INR
INR: 1.58 — AB (ref 0.00–1.49)
PROTHROMBIN TIME: 18.9 s — AB (ref 11.6–15.2)

## 2015-02-18 MED ORDER — HYDROCODONE-ACETAMINOPHEN 5-325 MG PO TABS
1.0000 | ORAL_TABLET | Freq: Four times a day (QID) | ORAL | Status: DC | PRN
  Administered 2015-02-18: 1 via ORAL
  Filled 2015-02-18: qty 1

## 2015-02-18 NOTE — Discharge Instructions (Signed)
° ° °  Supplemental Discharge Instructions for  Pacemaker/Defibrillator Patients  Activity No heavy lifting or vigorous activity with your left/right arm for 6 to 8 weeks.  Do not raise your left/right arm above your head for one week.  Gradually raise your affected arm as drawn below.           __    02-22-15                  02-23-15                  02-24-15                02-25-15  NO DRIVING for  1 week   ; you may begin driving on  [id number]   .  WOUND CARE - Keep the wound area clean and dry.  Do not get this area wet for one week. No showers for one week; you may shower on   02-25-15  . - The tape/steri-strips on your wound will fall off; do not pull them off.  No bandage is needed on the site.  DO  NOT apply any creams, oils, or ointments to the wound area. - If you notice any drainage or discharge from the wound, any swelling or bruising at the site, or you develop a fever > 101? F after you are discharged home, call the office at once.  Special Instructions - You are still able to use cellular telephones; use the ear opposite the side where you have your pacemaker/defibrillator.  Avoid carrying your cellular phone near your device. - When traveling through airports, show security personnel your identification card to avoid being screened in the metal detectors.  Ask the security personnel to use the hand wand. - Avoid arc welding equipment, MRI testing (magnetic resonance imaging), TENS units (transcutaneous nerve stimulators).  Call the office for questions about other devices. - Avoid electrical appliances that are in poor condition or are not properly grounded. - Microwave ovens are safe to be near or to operate.

## 2015-02-18 NOTE — Progress Notes (Signed)
Patient requesting bathroom assistance. Bedpan recommended for bedrest compliance. Patient refusing to use bedpan and insisting on using bedside commode. Assistance provided.

## 2015-02-18 NOTE — Progress Notes (Signed)
Pt. Discharged to home  Pt. D/C'd via  Wheelchair with daughter Discharge information reviewed and given All personal belongings given to Pt.  Education discussed and handouts given to patient IV was d/c and intact upon removal  Tele d/c

## 2015-02-25 ENCOUNTER — Encounter: Payer: Self-pay | Admitting: Internal Medicine

## 2015-02-26 DIAGNOSIS — I4891 Unspecified atrial fibrillation: Secondary | ICD-10-CM | POA: Diagnosis not present

## 2015-02-26 DIAGNOSIS — Z7901 Long term (current) use of anticoagulants: Secondary | ICD-10-CM | POA: Diagnosis not present

## 2015-03-03 ENCOUNTER — Ambulatory Visit (INDEPENDENT_AMBULATORY_CARE_PROVIDER_SITE_OTHER): Payer: Medicare Other | Admitting: *Deleted

## 2015-03-03 ENCOUNTER — Encounter: Payer: Self-pay | Admitting: Internal Medicine

## 2015-03-03 ENCOUNTER — Encounter: Payer: Self-pay | Admitting: Cardiovascular Disease

## 2015-03-03 DIAGNOSIS — I495 Sick sinus syndrome: Secondary | ICD-10-CM

## 2015-03-03 LAB — CUP PACEART INCLINIC DEVICE CHECK
Battery Remaining Longevity: 149 mo
Battery Voltage: 3.14 V
Brady Statistic AP VS Percent: 0.22 %
Brady Statistic AS VS Percent: 99.71 %
Brady Statistic RV Percent Paced: 0.08 %
Date Time Interrogation Session: 20161228112906
Implantable Lead Implant Date: 20161214
Implantable Lead Model: 5076
Lead Channel Impedance Value: 589 Ohm
Lead Channel Impedance Value: 646 Ohm
Lead Channel Pacing Threshold Amplitude: 0.75 V
Lead Channel Sensing Intrinsic Amplitude: 20.25 mV
Lead Channel Sensing Intrinsic Amplitude: 5.625 mV
Lead Channel Setting Pacing Amplitude: 3.5 V
Lead Channel Setting Pacing Amplitude: 3.5 V
Lead Channel Setting Pacing Pulse Width: 0.4 ms
Lead Channel Setting Sensing Sensitivity: 2.8 mV
MDC IDC LEAD IMPLANT DT: 20161214
MDC IDC LEAD LOCATION: 753859
MDC IDC LEAD LOCATION: 753860
MDC IDC MSMT LEADCHNL RA IMPEDANCE VALUE: 437 Ohm
MDC IDC MSMT LEADCHNL RA IMPEDANCE VALUE: 551 Ohm
MDC IDC MSMT LEADCHNL RA PACING THRESHOLD AMPLITUDE: 1.625 V
MDC IDC MSMT LEADCHNL RA PACING THRESHOLD PULSEWIDTH: 0.4 ms
MDC IDC MSMT LEADCHNL RA SENSING INTR AMPL: 5.25 mV
MDC IDC MSMT LEADCHNL RV PACING THRESHOLD PULSEWIDTH: 0.4 ms
MDC IDC MSMT LEADCHNL RV SENSING INTR AMPL: 20.125 mV
MDC IDC STAT BRADY AP VP PERCENT: 0.01 %
MDC IDC STAT BRADY AS VP PERCENT: 0.07 %
MDC IDC STAT BRADY RA PERCENT PACED: 0.22 %

## 2015-03-03 NOTE — Progress Notes (Signed)
Wound check appointment. Steri-strips removed from both incision sites (ILR extraction). Both wounds without redness or edema. Incision edges approximated, wounds well healed. Normal device function. Thresholds, sensing, and impedances consistent with implant measurements. Device programmed at 3.5V/auto capture programmed on for extra safety margin until 3 month visit. Histogram distribution appropriate for patient and level of activity. No AT/AF episodes. No high ventricular rates noted. Patient educated about wound care, arm mobility, lifting restrictions. ROV w/ JA 06/02/15.

## 2015-03-15 ENCOUNTER — Ambulatory Visit: Payer: Medicare Other

## 2015-03-16 DIAGNOSIS — E559 Vitamin D deficiency, unspecified: Secondary | ICD-10-CM | POA: Diagnosis not present

## 2015-03-16 DIAGNOSIS — E785 Hyperlipidemia, unspecified: Secondary | ICD-10-CM | POA: Diagnosis not present

## 2015-03-16 DIAGNOSIS — I10 Essential (primary) hypertension: Secondary | ICD-10-CM | POA: Diagnosis not present

## 2015-03-16 DIAGNOSIS — Z5181 Encounter for therapeutic drug level monitoring: Secondary | ICD-10-CM | POA: Diagnosis not present

## 2015-03-16 DIAGNOSIS — D509 Iron deficiency anemia, unspecified: Secondary | ICD-10-CM | POA: Diagnosis not present

## 2015-03-16 DIAGNOSIS — N183 Chronic kidney disease, stage 3 (moderate): Secondary | ICD-10-CM | POA: Diagnosis not present

## 2015-03-16 DIAGNOSIS — R300 Dysuria: Secondary | ICD-10-CM | POA: Diagnosis not present

## 2015-03-16 DIAGNOSIS — E1122 Type 2 diabetes mellitus with diabetic chronic kidney disease: Secondary | ICD-10-CM | POA: Diagnosis not present

## 2015-03-22 DIAGNOSIS — L821 Other seborrheic keratosis: Secondary | ICD-10-CM | POA: Diagnosis not present

## 2015-03-22 DIAGNOSIS — Z08 Encounter for follow-up examination after completed treatment for malignant neoplasm: Secondary | ICD-10-CM | POA: Diagnosis not present

## 2015-03-22 DIAGNOSIS — L570 Actinic keratosis: Secondary | ICD-10-CM | POA: Diagnosis not present

## 2015-03-22 DIAGNOSIS — Z85828 Personal history of other malignant neoplasm of skin: Secondary | ICD-10-CM | POA: Diagnosis not present

## 2015-03-26 DIAGNOSIS — I4891 Unspecified atrial fibrillation: Secondary | ICD-10-CM | POA: Diagnosis not present

## 2015-03-26 DIAGNOSIS — Z7901 Long term (current) use of anticoagulants: Secondary | ICD-10-CM | POA: Diagnosis not present

## 2015-03-26 DIAGNOSIS — R3129 Other microscopic hematuria: Secondary | ICD-10-CM | POA: Diagnosis not present

## 2015-04-06 LAB — CUP PACEART REMOTE DEVICE CHECK: Date Time Interrogation Session: 20161202150544

## 2015-04-23 DIAGNOSIS — I480 Paroxysmal atrial fibrillation: Secondary | ICD-10-CM | POA: Diagnosis not present

## 2015-04-23 DIAGNOSIS — Z7901 Long term (current) use of anticoagulants: Secondary | ICD-10-CM | POA: Diagnosis not present

## 2015-04-29 DIAGNOSIS — R300 Dysuria: Secondary | ICD-10-CM | POA: Diagnosis not present

## 2015-04-29 DIAGNOSIS — R3129 Other microscopic hematuria: Secondary | ICD-10-CM | POA: Diagnosis not present

## 2015-05-06 DIAGNOSIS — M1711 Unilateral primary osteoarthritis, right knee: Secondary | ICD-10-CM | POA: Diagnosis not present

## 2015-05-07 DIAGNOSIS — I4891 Unspecified atrial fibrillation: Secondary | ICD-10-CM | POA: Diagnosis not present

## 2015-05-07 DIAGNOSIS — Z7901 Long term (current) use of anticoagulants: Secondary | ICD-10-CM | POA: Diagnosis not present

## 2015-05-11 DIAGNOSIS — Z111 Encounter for screening for respiratory tuberculosis: Secondary | ICD-10-CM | POA: Diagnosis not present

## 2015-05-13 DIAGNOSIS — M1711 Unilateral primary osteoarthritis, right knee: Secondary | ICD-10-CM | POA: Diagnosis not present

## 2015-05-20 DIAGNOSIS — M1711 Unilateral primary osteoarthritis, right knee: Secondary | ICD-10-CM | POA: Diagnosis not present

## 2015-05-21 DIAGNOSIS — Z7901 Long term (current) use of anticoagulants: Secondary | ICD-10-CM | POA: Diagnosis not present

## 2015-05-21 DIAGNOSIS — I480 Paroxysmal atrial fibrillation: Secondary | ICD-10-CM | POA: Diagnosis not present

## 2015-05-24 DIAGNOSIS — Z803 Family history of malignant neoplasm of breast: Secondary | ICD-10-CM | POA: Diagnosis not present

## 2015-05-24 DIAGNOSIS — Z1231 Encounter for screening mammogram for malignant neoplasm of breast: Secondary | ICD-10-CM | POA: Diagnosis not present

## 2015-05-27 DIAGNOSIS — M1711 Unilateral primary osteoarthritis, right knee: Secondary | ICD-10-CM | POA: Diagnosis not present

## 2015-06-02 ENCOUNTER — Encounter: Payer: Self-pay | Admitting: Internal Medicine

## 2015-06-02 ENCOUNTER — Ambulatory Visit (INDEPENDENT_AMBULATORY_CARE_PROVIDER_SITE_OTHER): Payer: Medicare Other | Admitting: Internal Medicine

## 2015-06-02 VITALS — BP 108/58 | HR 74 | Ht 62.0 in | Wt 198.4 lb

## 2015-06-02 DIAGNOSIS — I10 Essential (primary) hypertension: Secondary | ICD-10-CM | POA: Diagnosis not present

## 2015-06-02 DIAGNOSIS — I480 Paroxysmal atrial fibrillation: Secondary | ICD-10-CM

## 2015-06-02 DIAGNOSIS — I495 Sick sinus syndrome: Secondary | ICD-10-CM | POA: Diagnosis not present

## 2015-06-02 DIAGNOSIS — Z950 Presence of cardiac pacemaker: Secondary | ICD-10-CM

## 2015-06-02 LAB — CUP PACEART INCLINIC DEVICE CHECK
Battery Voltage: 3.08 V
Brady Statistic AS VP Percent: 0.07 %
Brady Statistic RA Percent Paced: 0.21 %
Implantable Lead Implant Date: 20161214
Implantable Lead Location: 753860
Implantable Lead Model: 5076
Implantable Lead Model: 5076
Lead Channel Impedance Value: 570 Ohm
Lead Channel Impedance Value: 703 Ohm
Lead Channel Pacing Threshold Amplitude: 1 V
Lead Channel Pacing Threshold Pulse Width: 0.4 ms
Lead Channel Sensing Intrinsic Amplitude: 3.875 mV
Lead Channel Setting Pacing Amplitude: 2.5 V
Lead Channel Setting Sensing Sensitivity: 2.8 mV
MDC IDC LEAD IMPLANT DT: 20161214
MDC IDC LEAD LOCATION: 753859
MDC IDC MSMT BATTERY REMAINING LONGEVITY: 149 mo
MDC IDC MSMT LEADCHNL RA IMPEDANCE VALUE: 380 Ohm
MDC IDC MSMT LEADCHNL RA IMPEDANCE VALUE: 475 Ohm
MDC IDC MSMT LEADCHNL RA PACING THRESHOLD PULSEWIDTH: 0.4 ms
MDC IDC MSMT LEADCHNL RV PACING THRESHOLD AMPLITUDE: 0.75 V
MDC IDC MSMT LEADCHNL RV SENSING INTR AMPL: 18.5 mV
MDC IDC SESS DTM: 20170329115508
MDC IDC SET LEADCHNL RA PACING AMPLITUDE: 2 V
MDC IDC SET LEADCHNL RV PACING PULSEWIDTH: 0.4 ms
MDC IDC STAT BRADY AP VP PERCENT: 0 %
MDC IDC STAT BRADY AP VS PERCENT: 0.21 %
MDC IDC STAT BRADY AS VS PERCENT: 99.72 %
MDC IDC STAT BRADY RV PERCENT PACED: 0.07 %

## 2015-06-02 NOTE — Progress Notes (Signed)
Electrophysiology Office Note   Date:  06/02/2015   ID:  Mishaal, [place] 1930/06/23, MRN [id number]  PCP:  Abigail Miyamoto, MD  Cardiologist:  Dr Irish Lack Primary Electrophysiologist: Thompson Grayer, MD    No chief complaint on file.    History of Present Illness: Lynn Dunn is a 82 y.o. female who presents today for electrophysiology evaluation.   She has done well since her pacemaker was implanted.  Presyncope/ syncope has resolved.  AF is well controlled.  She is pleased with current health state.  Today, she denies symptoms of palpitations,shortness of breath, orthopnea, PND, lower extremity edema, claudication, bleeding, or neurologic sequela. The patient is tolerating medications without difficulties and is otherwise without complaint today.    Past Medical History  Diagnosis Date  . HTN (hypertension)   . PAF (paroxysmal atrial fibrillation) ([place])     s/p ablation 07/29/09  . COPD (chronic obstructive pulmonary disease) ([place])   . GERD (gastroesophageal reflux disease)   . Obesity   . Stroke ([place]) 6/09  . Anginal pain ([place]) 2011    had to take nitro   . OSA (obstructive sleep apnea)     cpap at home  . Anemia   . Hx of echocardiogram     Echo (12/15):  Mild LVH, EF 60-65%, mild MR, mod LAE (LA 49 mm), PASP 43 mmHg  . Sick sinus syndrome ([facility])     pauses with syncope   Past Surgical History  Procedure Laterality Date  . Atrial ablation surgery      s/p ablation for afib 07/29/09  . Abdominal hysterectomy    . Esophagogastroduodenoscopy  01/24/2012    Procedure: ESOPHAGOGASTRODUODENOSCOPY (EGD);  Surgeon: Jeryl Columbia, MD;  Location: [facility] ENDOSCOPY;  Service: Endoscopy;  Laterality: N/A;  . Loop recorder implant N/A 03/12/2014    Procedure: LOOP RECORDER IMPLANT;  Surgeon: Thompson Grayer, MD;  Location: [facility] CATH LAB;  Service: Cardiovascular;  Laterality: N/A;  . Ep implantable device N/A 02/17/2015    MDT Advisa MRI PPM implanted by Dr Lovena Le for sick sinus and  syncope  . Ep implantable device N/A 02/17/2015    Procedure: Loop Recorder Removal;  Surgeon: Evans Lance, MD;  Location: [facility] CV LAB;  Service: Cardiovascular;  Laterality: N/A;     Current Outpatient Prescriptions  Medication Sig Dispense Refill  . Cholecalciferol (VITAMIN D PO) Take 2 capsules by mouth daily.     . Coenzyme Q10 (COQ10 PO) Take 1 capsule by mouth daily.    . furosemide (LASIX) 40 MG tablet Take 1 tablet (40 mg total) by mouth 2 (two) times daily. 180 tablet 3  . KRILL OIL OMEGA-3 PO Take 1 capsule by mouth twice daily.    . Magnesium 100 MG CAPS Take 1 capsule by mouth 2 (two) times daily.     . metoprolol tartrate (LOPRESSOR) 25 MG tablet TAKE 1 TABLET BY MOUTH TWICE DAILY.  MAY TAKE ONE EXTRA 25MG  TABLET DAILY IF YOU FEEL YOUR HEART IS OUT OF RHYTHM.    Marland Kitchen potassium chloride (K-DUR) 10 MEQ tablet Take 10 mEq by mouth daily.    . ranitidine (ZANTAC) 150 MG capsule Take 150 mg by mouth daily as needed for heartburn.     . rosuvastatin (CRESTOR) 20 MG tablet Take 20 mg by mouth daily.    Marland Kitchen warfarin (COUMADIN) 1 MG tablet Take 1 mg by mouth 2 (two) times a week. Wednesday and Sunday takes to 4 mg dose    .  warfarin (COUMADIN) 3 MG tablet Take 3 mg by mouth daily. Takes an additonal 1 mg on Wednesday and Sunday. Entered in computer.     No current facility-administered medications for this visit.    Allergies:   Albuterol   Social History:  The patient  reports that she has never smoked. She does not have any smokeless tobacco history on file. She reports that she does not drink alcohol or use illicit drugs.   Family History:  The patient's family history includes Brain cancer in her father; Breast cancer in her mother; Hypertension in her sister; Stroke in her maternal aunt. There is no history of Heart attack.    ROS:  Please see the history of present illness.   All other systems are reviewed and negative.    PHYSICAL EXAM: VS:  BP 108/58 mmHg  Pulse 74   Ht 5\' 2"  (1.575 m)  Wt 198 lb 6.4 oz (89.994 kg)  BMI 36.28 kg/m2 , BMI Body mass index is 36.28 kg/(m^2). GEN: Well nourished, well developed, in no acute distress HEENT: normal with dry MM Neck: no JVD, carotid bruits, or masses Cardiac: RRR; no murmurs, rubs, or gallops,no edema  Respiratory:  clear to auscultation bilaterally, normal work of breathing GI: soft, nontender, nondistended, + BS MS: no deformity or atrophy Skin: warm and dry  Neuro:  Strength and sensation are intact Psych: euthymic mood, full affect   Pacemaker interrogation is reviewed today  Lipid Panel     Component Value Date/Time   CHOL 170 08/31/2014 0941   TRIG 162.0* 08/31/2014 0941   HDL 51.80 08/31/2014 0941   CHOLHDL 3 08/31/2014 0941   VLDL 32.4 08/31/2014 0941   LDLCALC 86 08/31/2014 0941     Wt Readings from Last 3 Encounters:  06/02/15 198 lb 6.4 oz (89.994 kg)  02/16/15 195 lb 6.4 oz (88.633 kg)  02/15/15 194 lb 12.8 oz (88.361 kg)     ASSESSMENT AND PLAN:  1. Sick sinus syndrome Normal pacemaker function See Pace Art report No changes today  2. afib Well controlled Continue current medicines Will require life long anticoagulation  3. OSA Continue CPAP  4. HTN Stable No change required today  5.  Obesity Body mass index is 36.28 kg/(m^2).  She struggles with lifestyle modification   Follow-up with Dr Irish Lack for routine cardiology management  carelink remote monitoring Return to see EP NP in 1 year   Signed, Thompson Grayer, MD    [facility] [address] [place] [address] [phone] (office) [phone] (fax)

## 2015-06-02 NOTE — Patient Instructions (Signed)
Medication Instructions:  Your physician recommends that you continue on your current medications as directed. Please refer to the Current Medication list given to you today.   Labwork: None ordered   Testing/Procedures: None ordered   Follow-Up: Your physician wants you to follow-up in: 12 months with Chanetta Marshall, NP You will receive a reminder letter in the mail two months in advance. If you don't receive a letter, please call our office to schedule the follow-up appointment.   Remote monitoring is used to monitor your Pacemaker from home. This monitoring reduces the number of office visits required to check your device to one time per year. It allows [place] to keep an eye on the functioning of your device to ensure it is working properly. You are scheduled for a device check from home on 09/01/15. You may send your transmission at any time that day. If you have a wireless device, the transmission will be sent automatically. After your physician reviews your transmission, you will receive a postcard with your next transmission date.    Any Other Special Instructions Will Be Listed Below (If Applicable).     If you need a refill on your cardiac medications before your next appointment, please call your pharmacy.

## 2015-06-03 DIAGNOSIS — M1711 Unilateral primary osteoarthritis, right knee: Secondary | ICD-10-CM | POA: Diagnosis not present

## 2015-06-07 DIAGNOSIS — R300 Dysuria: Secondary | ICD-10-CM | POA: Diagnosis not present

## 2015-06-07 DIAGNOSIS — Z0000 Encounter for general adult medical examination without abnormal findings: Secondary | ICD-10-CM | POA: Diagnosis not present

## 2015-06-07 DIAGNOSIS — N9089 Other specified noninflammatory disorders of vulva and perineum: Secondary | ICD-10-CM | POA: Diagnosis not present

## 2015-06-07 DIAGNOSIS — R3121 Asymptomatic microscopic hematuria: Secondary | ICD-10-CM | POA: Diagnosis not present

## 2015-06-07 DIAGNOSIS — N952 Postmenopausal atrophic vaginitis: Secondary | ICD-10-CM | POA: Diagnosis not present

## 2015-06-07 DIAGNOSIS — N3946 Mixed incontinence: Secondary | ICD-10-CM | POA: Diagnosis not present

## 2015-06-09 ENCOUNTER — Other Ambulatory Visit: Payer: Self-pay | Admitting: Urology

## 2015-06-15 DIAGNOSIS — N3946 Mixed incontinence: Secondary | ICD-10-CM | POA: Diagnosis not present

## 2015-06-15 DIAGNOSIS — R300 Dysuria: Secondary | ICD-10-CM | POA: Diagnosis not present

## 2015-06-15 DIAGNOSIS — R3129 Other microscopic hematuria: Secondary | ICD-10-CM | POA: Diagnosis not present

## 2015-06-15 DIAGNOSIS — R3121 Asymptomatic microscopic hematuria: Secondary | ICD-10-CM | POA: Diagnosis not present

## 2015-06-17 ENCOUNTER — Encounter (HOSPITAL_COMMUNITY): Payer: Self-pay

## 2015-06-17 ENCOUNTER — Encounter (HOSPITAL_COMMUNITY)
Admission: RE | Admit: 2015-06-17 | Discharge: 2015-06-17 | Disposition: A | Discharge status: Home / Self Care | Payer: Medicare Other | Source: Ambulatory Visit | Attending: Urology | Admitting: Urology

## 2015-06-17 DIAGNOSIS — I10 Essential (primary) hypertension: Secondary | ICD-10-CM | POA: Diagnosis not present

## 2015-06-17 DIAGNOSIS — I4891 Unspecified atrial fibrillation: Secondary | ICD-10-CM | POA: Insufficient documentation

## 2015-06-17 DIAGNOSIS — K660 Peritoneal adhesions (postprocedural) (postinfection): Secondary | ICD-10-CM | POA: Insufficient documentation

## 2015-06-17 DIAGNOSIS — Z01812 Encounter for preprocedural laboratory examination: Secondary | ICD-10-CM | POA: Diagnosis not present

## 2015-06-17 DIAGNOSIS — Z950 Presence of cardiac pacemaker: Secondary | ICD-10-CM | POA: Insufficient documentation

## 2015-06-17 HISTORY — DX: Dysuria: R30.0

## 2015-06-17 HISTORY — DX: Unspecified osteoarthritis, unspecified site: M19.90

## 2015-06-17 HISTORY — DX: Hematuria, unspecified: R31.9

## 2015-06-17 LAB — CBC
HCT: 36 % (ref 36.0–46.0)
Hemoglobin: 11.6 g/dL — ABNORMAL LOW (ref 12.0–15.0)
MCH: 29.4 pg (ref 26.0–34.0)
MCHC: 32.2 g/dL (ref 30.0–36.0)
MCV: 91.4 fL (ref 78.0–100.0)
PLATELETS: 235 10*3/uL (ref 150–400)
RBC: 3.94 MIL/uL (ref 3.87–5.11)
RDW: 16.5 % — AB (ref 11.5–15.5)
WBC: 7.8 10*3/uL (ref 4.0–10.5)

## 2015-06-17 NOTE — Patient Instructions (Addendum)
YOUR PROCEDURE IS SCHEDULED ON :  06/23/15  REPORT TO [facility] MAIN ENTRANCE FOLLOW SIGNS TO EAST ELEVATOR - GO TO 3rd FLOOR CHECK IN AT 3 EAST NURSES STATION (SHORT STAY) AT:  10:45 AM  CALL THIS NUMBER IF YOU HAVE PROBLEMS THE MORNING OF SURGERY [phone]  REMEMBER:ONLY 1 PER PERSON MAY GO TO SHORT STAY WITH YOU TO GET READY THE MORNING OF YOUR SURGERY  DO NOT EAT FOOD OR DRINK LIQUIDS AFTER MIDNIGHT  TAKE THESE MEDICINES THE MORNING OF SURGERY:  METOPROLOL / RANITIDINE  BRING C-PAP TUBING AND MASK TO HOSPITAL  YOU MAY NOT HAVE ANY METAL ON YOUR BODY INCLUDING HAIR PINS AND PIERCING'S. DO NOT WEAR JEWELRY, MAKEUP, LOTIONS, POWDERS OR PERFUMES. DO NOT WEAR NAIL POLISH. DO NOT SHAVE 48 HRS PRIOR TO SURGERY. MEN MAY SHAVE FACE AND NECK.  DO NOT [facility]. [facility] IS NOT RESPONSIBLE FOR VALUABLES.  CONTACTS, DENTURES OR PARTIALS MAY NOT BE WORN TO SURGERY. LEAVE SUITCASE IN CAR. CAN BE BROUGHT TO ROOM AFTER SURGERY.  PATIENTS DISCHARGED THE DAY OF SURGERY WILL NOT BE ALLOWED TO DRIVE HOME.  PLEASE READ OVER THE FOLLOWING INSTRUCTION SHEETS _________________________________________________________________________________                                          [facility] - PREPARING FOR SURGERY  Before surgery, you can play an important role.  Because skin is not sterile, your skin needs to be as free of germs as possible.  You can reduce the number of germs on your skin by washing with CHG (chlorahexidine gluconate) soap before surgery.  CHG is an antiseptic cleaner which kills germs and bonds with the skin to continue killing germs even after washing. Please DO NOT use if you have an allergy to CHG or antibacterial soaps.  If your skin becomes reddened/irritated stop using the CHG and inform your nurse when you arrive at Short Stay. Do not shave (including legs and underarms) for at least 48 hours prior to the first CHG shower.  You may  shave your face. Please follow these instructions carefully:   1.  Shower with CHG Soap the night before surgery and the  morning of Surgery.   2.  If you choose to wash your hair, wash your hair first as usual with your  normal  Shampoo.   3.  After you shampoo, rinse your hair and body thoroughly to remove the  shampoo.                                         4.  Use CHG as you would any other liquid soap.  You can apply chg directly  to the skin and wash . Gently wash with scrungie or clean wascloth    5.  Apply the CHG Soap to your body ONLY FROM THE NECK DOWN.   Do not use on open                           Wound or open sores. Avoid contact with eyes, ears mouth and genitals (private parts).  Genitals (private parts) with your normal soap.              6.  Wash thoroughly, paying special attention to the area where your surgery  will be performed.   7.  Thoroughly rinse your body with warm water from the neck down.   8.  DO NOT shower/wash with your normal soap after using and rinsing off  the CHG Soap .                9.  Pat yourself dry with a clean towel.             10.  Wear clean night clothes to bed after shower             11.  Place clean sheets on your bed the night of your first shower and do not  sleep with pets.  Day of Surgery : Do not apply any lotions/deodorants the morning of surgery.  Please wear clean clothes to the hospital/surgery center.  FAILURE TO FOLLOW THESE INSTRUCTIONS MAY RESULT IN THE CANCELLATION OF YOUR SURGERY    PATIENT SIGNATURE_________________________________  ______________________________________________________________________

## 2015-06-23 ENCOUNTER — Ambulatory Visit (HOSPITAL_COMMUNITY): Payer: Medicare Other | Admitting: Anesthesiology

## 2015-06-23 ENCOUNTER — Ambulatory Visit (HOSPITAL_COMMUNITY)
Admission: RE | Admit: 2015-06-23 | Discharge: 2015-06-23 | Disposition: A | Discharge status: Home / Self Care | Payer: Medicare Other | Source: Ambulatory Visit | Attending: Urology | Admitting: Urology

## 2015-06-23 ENCOUNTER — Encounter (HOSPITAL_COMMUNITY): Payer: Self-pay | Admitting: *Deleted

## 2015-06-23 ENCOUNTER — Encounter (HOSPITAL_COMMUNITY)
Admission: RE | Disposition: A | Discharge status: Home / Self Care | Payer: Self-pay | Source: Ambulatory Visit | Attending: Urology

## 2015-06-23 DIAGNOSIS — J449 Chronic obstructive pulmonary disease, unspecified: Secondary | ICD-10-CM | POA: Diagnosis not present

## 2015-06-23 DIAGNOSIS — E119 Type 2 diabetes mellitus without complications: Secondary | ICD-10-CM | POA: Insufficient documentation

## 2015-06-23 DIAGNOSIS — I10 Essential (primary) hypertension: Secondary | ICD-10-CM | POA: Insufficient documentation

## 2015-06-23 DIAGNOSIS — N9089 Other specified noninflammatory disorders of vulva and perineum: Secondary | ICD-10-CM | POA: Diagnosis not present

## 2015-06-23 DIAGNOSIS — R3129 Other microscopic hematuria: Secondary | ICD-10-CM | POA: Diagnosis not present

## 2015-06-23 DIAGNOSIS — Z950 Presence of cardiac pacemaker: Secondary | ICD-10-CM | POA: Diagnosis not present

## 2015-06-23 DIAGNOSIS — Z90710 Acquired absence of both cervix and uterus: Secondary | ICD-10-CM | POA: Insufficient documentation

## 2015-06-23 DIAGNOSIS — Z6836 Body mass index (BMI) 36.0-36.9, adult: Secondary | ICD-10-CM | POA: Diagnosis not present

## 2015-06-23 DIAGNOSIS — E669 Obesity, unspecified: Secondary | ICD-10-CM | POA: Diagnosis not present

## 2015-06-23 DIAGNOSIS — Z7901 Long term (current) use of anticoagulants: Secondary | ICD-10-CM | POA: Insufficient documentation

## 2015-06-23 DIAGNOSIS — N952 Postmenopausal atrophic vaginitis: Secondary | ICD-10-CM | POA: Diagnosis not present

## 2015-06-23 DIAGNOSIS — I480 Paroxysmal atrial fibrillation: Secondary | ICD-10-CM | POA: Diagnosis not present

## 2015-06-23 DIAGNOSIS — I2510 Atherosclerotic heart disease of native coronary artery without angina pectoris: Secondary | ICD-10-CM | POA: Insufficient documentation

## 2015-06-23 DIAGNOSIS — Z8673 Personal history of transient ischemic attack (TIA), and cerebral infarction without residual deficits: Secondary | ICD-10-CM | POA: Diagnosis not present

## 2015-06-23 DIAGNOSIS — N3946 Mixed incontinence: Secondary | ICD-10-CM | POA: Diagnosis not present

## 2015-06-23 DIAGNOSIS — R319 Hematuria, unspecified: Secondary | ICD-10-CM | POA: Diagnosis not present

## 2015-06-23 DIAGNOSIS — G4733 Obstructive sleep apnea (adult) (pediatric): Secondary | ICD-10-CM | POA: Insufficient documentation

## 2015-06-23 HISTORY — PX: CYSTOSCOPY: SHX5120

## 2015-06-23 HISTORY — PX: LYSIS OF ADHESION: SHX5961

## 2015-06-23 LAB — PROTIME-INR
INR: 1.37 (ref 0.00–1.49)
PROTHROMBIN TIME: 16.5 s — AB (ref 11.6–15.2)

## 2015-06-23 SURGERY — LAPAROTOMY, FOR LYSIS OF ADHESIONS
Anesthesia: General

## 2015-06-23 MED ORDER — STERILE WATER FOR IRRIGATION IR SOLN
Status: DC | PRN
  Administered 2015-06-23: 3000 mL

## 2015-06-23 MED ORDER — DEXAMETHASONE SODIUM PHOSPHATE 10 MG/ML IJ SOLN
INTRAMUSCULAR | Status: AC
  Filled 2015-06-23: qty 1

## 2015-06-23 MED ORDER — ONDANSETRON HCL 4 MG/2ML IJ SOLN
INTRAMUSCULAR | Status: DC | PRN
  Administered 2015-06-23: 4 mg via INTRAVENOUS

## 2015-06-23 MED ORDER — FENTANYL CITRATE (PF) 100 MCG/2ML IJ SOLN
INTRAMUSCULAR | Status: AC
  Filled 2015-06-23: qty 2

## 2015-06-23 MED ORDER — PROPOFOL 10 MG/ML IV BOLUS
INTRAVENOUS | Status: AC
  Filled 2015-06-23: qty 20

## 2015-06-23 MED ORDER — DEXAMETHASONE SODIUM PHOSPHATE 10 MG/ML IJ SOLN
INTRAMUSCULAR | Status: DC | PRN
  Administered 2015-06-23: 10 mg via INTRAVENOUS

## 2015-06-23 MED ORDER — LIDOCAINE HCL (CARDIAC) 20 MG/ML IV SOLN
INTRAVENOUS | Status: DC | PRN
  Administered 2015-06-23: 100 mg via INTRAVENOUS

## 2015-06-23 MED ORDER — ESTRADIOL 0.1 MG/GM VA CREA
TOPICAL_CREAM | VAGINAL | Status: DC | PRN
  Administered 2015-06-23: 3 via VAGINAL

## 2015-06-23 MED ORDER — PROPOFOL 10 MG/ML IV BOLUS
INTRAVENOUS | Status: DC | PRN
  Administered 2015-06-23: 150 mg via INTRAVENOUS

## 2015-06-23 MED ORDER — LIDOCAINE HCL (CARDIAC) 20 MG/ML IV SOLN
INTRAVENOUS | Status: AC
Start: 2015-06-23 — End: 2015-06-23
  Filled 2015-06-23: qty 5

## 2015-06-23 MED ORDER — FENTANYL CITRATE (PF) 100 MCG/2ML IJ SOLN
25.0000 ug | INTRAMUSCULAR | Status: DC | PRN
  Administered 2015-06-23 (×2): 50 ug via INTRAVENOUS

## 2015-06-23 MED ORDER — FENTANYL CITRATE (PF) 100 MCG/2ML IJ SOLN
INTRAMUSCULAR | Status: DC | PRN
  Administered 2015-06-23: 50 ug via INTRAVENOUS

## 2015-06-23 MED ORDER — ONDANSETRON HCL 4 MG/2ML IJ SOLN: 4.0000 mg | Freq: Once | INTRAMUSCULAR | Status: DC | PRN

## 2015-06-23 MED ORDER — ONDANSETRON HCL 4 MG/2ML IJ SOLN
INTRAMUSCULAR | Status: AC
  Filled 2015-06-23: qty 2

## 2015-06-23 MED ORDER — LACTATED RINGERS IV SOLN
INTRAVENOUS | Status: DC | PRN
Start: 2015-06-23 — End: 2015-06-23
  Administered 2015-06-23: 12:00:00 via INTRAVENOUS

## 2015-06-23 MED ORDER — ESTRADIOL 0.1 MG/GM VA CREA
TOPICAL_CREAM | VAGINAL | Status: AC
  Filled 2015-06-23: qty 127.5

## 2015-06-23 MED ORDER — CEFAZOLIN SODIUM-DEXTROSE 2-4 GM/100ML-% IV SOLN
2.0000 g | INTRAVENOUS | Status: AC
  Administered 2015-06-23: 2 g via INTRAVENOUS

## 2015-06-23 MED ORDER — TRAMADOL HCL 50 MG PO TABS: 50.0000 mg | ORAL_TABLET | Freq: Four times a day (QID) | ORAL | Status: DC | PRN

## 2015-06-23 MED ORDER — TRAMADOL HCL 50 MG PO TABS
50.0000 mg | ORAL_TABLET | Freq: Four times a day (QID) | ORAL | Status: AC
  Administered 2015-06-23: 50 mg via ORAL
  Filled 2015-06-23: qty 1

## 2015-06-23 MED ORDER — CEFAZOLIN SODIUM-DEXTROSE 2-4 GM/100ML-% IV SOLN
INTRAVENOUS | Status: AC
  Filled 2015-06-23: qty 100

## 2015-06-23 MED ORDER — ESTRADIOL 0.1 MG/GM VA CREA: TOPICAL_CREAM | VAGINAL | Status: DC

## 2015-06-23 SURGICAL SUPPLY — 15 items
BAG URO CATCHER STRL LF (MISCELLANEOUS) ×3 IMPLANT
BASKET ZERO TIP NITINOL 2.4FR (BASKET) IMPLANT
BSKT STON RTRVL ZERO TP 2.4FR (BASKET)
CATH INTERMIT  6FR 70CM (CATHETERS) IMPLANT
CLOTH BEACON ORANGE TIMEOUT ST (SAFETY) ×3 IMPLANT
GAUZE PACKING 1 X5 YD ST (GAUZE/BANDAGES/DRESSINGS) ×3 IMPLANT
GLOVE BIOGEL M STRL SZ7.5 (GLOVE) ×3 IMPLANT
GOWN STRL REUS W/TWL LRG LVL3 (GOWN DISPOSABLE) ×6 IMPLANT
GUIDEWIRE ANG ZIPWIRE 038X150 (WIRE) IMPLANT
GUIDEWIRE STR DUAL SENSOR (WIRE) IMPLANT
MANIFOLD NEPTUNE II (INSTRUMENTS) ×3 IMPLANT
PACK CYSTO (CUSTOM PROCEDURE TRAY) ×3 IMPLANT
PACK PERINEAL COLD (PAD) ×3 IMPLANT
TUBING CONNECTING 10 (TUBING) ×2 IMPLANT
TUBING CONNECTING 10' (TUBING) ×1

## 2015-06-23 NOTE — Discharge Instructions (Signed)
1 - You may have bloody urine and vaginal discharge on / off x few days. This is normal.  2 - Remove vaginal packing on Thursday morning at home.  2 - Call MD or go to ER for fever >102, severe pain / nausea / vomiting not relieved by medications, or acute change in medical status   General Anesthesia, Adult, Care After Refer to this sheet in the next few weeks. These instructions provide you with information on caring for yourself after your procedure. Your health care provider may also give you more specific instructions. Your treatment has been planned according to current medical practices, but problems sometimes occur. Call your health care provider if you have any problems or questions after your procedure. WHAT TO EXPECT AFTER THE PROCEDURE After the procedure, it is typical to experience:  Sleepiness.  Nausea and vomiting. HOME CARE INSTRUCTIONS  For the first 24 hours after general anesthesia:  Have a responsible person with you.  Do not drive a car. If you are alone, do not take public transportation.  Do not drink alcohol.  Do not take medicine that has not been prescribed by your health care provider.  Do not sign important papers or make important decisions.  You may resume a normal diet and activities as directed by your health care provider.  Change bandages (dressings) as directed.  If you have questions or problems that seem related to general anesthesia, call the hospital and ask for the anesthetist or anesthesiologist on call. SEEK MEDICAL CARE IF:  You have nausea and vomiting that continue the day after anesthesia.  You develop a rash. SEEK IMMEDIATE MEDICAL CARE IF:   You have difficulty breathing.  You have chest pain.  You have any allergic problems.   This information is not intended to replace advice given to you by your health care provider. Make sure you discuss any questions you have with your health care provider.   Document Released:  05/29/2000 Document Revised: 03/13/2014 Document Reviewed: 06/21/2011 Elsevier Interactive Patient Education [employer].

## 2015-06-23 NOTE — H&P (Signed)
Lynn Dunn is an 82 y.o. female.    Chief Complaint: Pre-op Lysis of Labial Adhesions  HPI:   1 - Microscopic hematuria - blood on UA noted x several by PCP. Non-smoker. No chronic solvent exposure. No gross episodes. She does have some irritative voiding. CT Urogram 06/2015 w/o upper tract masses / stones.   2 - Atrophic vaginitis with dysuria and severe labial adhesions - long h/o mild irritative symptoms and vaginal atrophy. Given trial of topical estrogens but could not afford. Pelvic 06/2015 with SEVERE labial adhesions with only 44mm opening of introitus, not allow passage of cystoscopy or pelvic exam.    PMH sig for ischemic CVA/Warfarin, Arrythmia / Pacemaker, CAD, DM2, Benign Vag Hyst. Her PCP is Lynn Dunn with [facility].  Today "Lynn Dunn" is seen to proceed with operative lysis of labial adhesions and cystoscopy to compete hematuria evaluation. Interval CT reassuring w/o upper tract masses.   Past Medical History  Diagnosis Date  . HTN (hypertension)   . PAF (paroxysmal atrial fibrillation) ([place])     s/p ablation 07/29/09  . COPD (chronic obstructive pulmonary disease) ([place])   . GERD (gastroesophageal reflux disease)   . Obesity   . Anginal pain ([place]) 2011    had to take nitro   . OSA (obstructive sleep apnea)     cpap at home  . Anemia   . Hx of echocardiogram     Echo (12/15):  Mild LVH, EF 60-65%, mild MR, mod LAE (LA 49 mm), PASP 43 mmHg  . Sick sinus syndrome ([facility])     pauses with syncope  . Heart murmur   . Stroke ([place]) 6/09    NO RESIDUAL PROBLEMS  . Arthritis   . Hematuria   . Dysuria     Past Surgical History  Procedure Laterality Date  . Atrial ablation surgery      s/p ablation for afib 07/29/09  . Abdominal hysterectomy    . Esophagogastroduodenoscopy  01/24/2012    Procedure: ESOPHAGOGASTRODUODENOSCOPY (EGD);  Surgeon: Jeryl Columbia, Dunn;  Location: [facility] ENDOSCOPY;  Service: Endoscopy;  Laterality: N/A;  . Loop recorder implant N/A 03/12/2014     Procedure: LOOP RECORDER IMPLANT;  Surgeon: Thompson Grayer, Dunn;  Location: [facility] CATH LAB;  Service: Cardiovascular;  Laterality: N/A;  . Ep implantable device N/A 02/17/2015    MDT Advisa MRI PPM implanted by Dr Lovena Le for sick sinus and syncope  . Ep implantable device N/A 02/17/2015    Procedure: Loop Recorder Removal;  Surgeon: Evans Lance, Dunn;  Location: [facility] CV LAB;  Service: Cardiovascular;  Laterality: N/A;  . Knee arthroscopy      BILATERAL    Family History  Problem Relation Age of Onset  . Breast cancer    . Brain cancer    . Breast cancer Mother   . Brain cancer Father   . Heart attack Neg Hx   . Stroke Maternal Aunt   . Hypertension Sister    Social History:  reports that she has never smoked. She does not have any smokeless tobacco history on file. She reports that she does not drink alcohol or use illicit drugs.  Allergies:  Allergies  Allergen Reactions  . Albuterol Palpitations    Atrial fibrillation    No prescriptions prior to admission    No results found for this or any previous visit (from the past 48 hour(s)). No results found.  Review of Systems  Constitutional: Negative.   HENT: Negative.  Eyes: Negative.   Respiratory: Negative.   Cardiovascular: Negative.   Gastrointestinal: Negative.   Genitourinary: Negative.   Musculoskeletal: Negative.   Skin: Negative.   Neurological: Negative.   Endo/Heme/Allergies: Negative.   Psychiatric/Behavioral: Negative.     There were no vitals taken for this visit. Physical Exam  Constitutional: She appears well-developed.  HENT:  Head: Normocephalic.  Eyes: Pupils are equal, round, and reactive to light.  Neck: Normal range of motion.  Cardiovascular: Normal rate.   Respiratory: Effort normal.  GI: Soft.  Genitourinary:  No CVAT  Musculoskeletal: Normal range of motion.  Neurological: She is alert.  Skin: Skin is warm.  Psychiatric: She has a normal mood and affect. Her behavior is  normal. Judgment and thought content normal.     Assessment/Plan  1 - Microscopic hematuria - rec complete eval especially to rule out badder carcinoma given irritative voiding symptoms.  Imaging reassuring, cysto as part of procedure today r/o bladder pathology.   2 - Atrophic vaginitis with dysuria and severe labial adhesions - pt will require lysis of adhesions under anesthesia to perform adequtae pelvic exam and cysto followed by topical estrogen cream Qweekly. This is quite severe case. Risks, benefits, alternatives discussed previously and reiterated today.    Alexis Frock, Dunn 06/23/2015, 6:20 AM

## 2015-06-23 NOTE — Transfer of Care (Signed)
Immediate Anesthesia Transfer of Care Note  Patient: Lynn Dunn  Procedure(s) Performed: Procedure(s): LYSIS OF LABIAL ADHESION (N/A) CYSTOSCOPY (N/A) EXAM UNDER ANESTHESIA (N/A)  Patient Location: PACU  Anesthesia Type:General  Level of Consciousness: sedated  Airway & Oxygen Therapy: Patient Spontanous Breathing and Patient connected to face mask oxygen  Post-op Assessment: Report given to RN and Post -op Vital signs reviewed and stable  Post vital signs: Reviewed and stable  Last Vitals:  Filed Vitals:   06/23/15 1049  BP: 173/71  Pulse: 66  Temp: 36.9 C  Resp: 18    Complications: No apparent anesthesia complications

## 2015-06-23 NOTE — Anesthesia Preprocedure Evaluation (Addendum)
Anesthesia Evaluation  Patient identified by MRN, date of birth, ID band Patient awake    Reviewed: Allergy & Precautions, NPO status , Patient's Chart, lab work & pertinent test results  Airway Mallampati: II  TM Distance: >3 FB Neck ROM: Full    Dental no notable dental hx.    Pulmonary neg pulmonary ROS, shortness of breath, sleep apnea , COPD, neg recent URI,    Pulmonary exam normal breath sounds clear to auscultation       Cardiovascular hypertension, + angina negative cardio ROS Normal cardiovascular exam+ dysrhythmias + Valvular Problems/Murmurs  Rhythm:Regular Rate:Normal  A fib c controlled rate , pacemaker   Neuro/Psych CVA negative neurological ROS  negative psych ROS   GI/Hepatic negative GI ROS, Neg liver ROS, GERD  ,  Endo/Other  negative endocrine ROS  Renal/GU negative Renal ROS  negative genitourinary   Musculoskeletal negative musculoskeletal ROS (+) Arthritis ,   Abdominal   Peds negative pediatric ROS (+)  Hematology negative hematology ROS (+)   Anesthesia Other Findings   Reproductive/Obstetrics negative OB ROS                           Anesthesia Physical Anesthesia Plan  ASA: III  Anesthesia Plan: General   Post-op Pain Management:    Induction: Intravenous  Airway Management Planned: LMA  Additional Equipment:   Intra-op Plan:   Post-operative Plan: Extubation in OR  Informed Consent: I have reviewed the patients History and Physical, chart, labs and discussed the procedure including the risks, benefits and alternatives for the proposed anesthesia with the patient or authorized representative who has indicated his/her understanding and acceptance.   Dental advisory given  Plan Discussed with: CRNA  Anesthesia Plan Comments:        Anesthesia Quick Evaluation

## 2015-06-23 NOTE — Brief Op Note (Signed)
06/23/2015  12:54 PM  PATIENT:  Lynn Dunn  82 y.o. female  PRE-OPERATIVE DIAGNOSIS:  SEVERE LABIAL ADHESIONS,HEMATURIA  POST-OPERATIVE DIAGNOSIS:  SEVERE LABIAL ADHESIONS,HEMATURIA  PROCEDURE:  Procedure(s): LYSIS OF LABIAL ADHESION (N/A) CYSTOSCOPY (N/A) EXAM UNDER ANESTHESIA (N/A)  SURGEON:  Surgeon(s) and Role:    * Alexis Frock, MD - Primary  PHYSICIAN ASSISTANT:   ASSISTANTS: none   ANESTHESIA:   general  EBL:     BLOOD ADMINISTERED:none  DRAINS: none   LOCAL MEDICATIONS USED:  NONE  SPECIMEN:  No Specimen  DISPOSITION OF SPECIMEN:  N/A  COUNTS:  YES  TOURNIQUET:  * No tourniquets in log *  DICTATION: .Other Dictation: Dictation Number [id number]  PLAN OF CARE: Discharge to home after PACU  PATIENT DISPOSITION:  PACU - hemodynamically stable.   Delay start of Pharmacological VTE agent (>24hrs) due to surgical blood loss or risk of bleeding: no

## 2015-06-23 NOTE — Op Note (Signed)
NAME:  Lynn Dunn, Lynn Dunn NO.:  [id number]  MEDICAL RECORD NO.:  [id number]  LOCATION:  [facility]                         FACILITY:  [facility]  PHYSICIAN:  Alexis Frock, MD     DATE OF BIRTH:  1930/11/27  DATE OF PROCEDURE: 06/23/2015                               OPERATIVE REPORT   DIAGNOSIS:  Hematuria, severe labial adhesions.  PROCEDURES: 1. Lysis of labial adhesions and exam under anesthesia. 2. Cystoscopy.  ESTIMATED BLOOD LOSS:  Nil.  COMPLICATION:  None.  SPECIMEN:  None.  FINDINGS: 1. Severe labial adhesions, complete obliteration of labia majora and     minora and only approximately 4-mm lumen for urine, vaginal     secretions, drain, pre-adhesiolysis. 2. Surgical absence of uterus and cervix. 3. Unremarkable urinary bladder with cystoscopy.  ESTIMATED BLOOD LOSS:  Nil.  COMPLICATIONS:  None.  SPECIMEN:  None.  PACKING:  Vaginal Estrace soaked packing.  INDICATION:  [name] is a pleasant 82 year old lady who was found on workup of microscopic hematuria to have severe labial adhesions.  CT of her abdomen revealed no stones or worrisome upper tract masses.  Felt that cystoscopy would clearly be warranted to complete hematuria evaluation; however, office exam revealed quite severe labial adhesions unable to perform office cystoscopy.  Options were discussed including operative lysis of adhesions with cystoscopy with goal of releasing labial adhesions and complete hematuria evaluation and she wished to proceed.  Informed consent was obtained and placed in the medical record.  PROCEDURE IN DETAIL:  The patient being [name], was verified. Procedure being cystoscopy, exam under anesthesia and lysis of labial adhesion was confirmed.  Procedure was carried out.  Time-out was performed.  Intravenous antibiotics were administered.  General LMA anesthesia was introduced.  The patient was placed into a low lithotomy position and sterile field was  created by prepping and draping the patient's vagina as much as possible using iodinated prep.  Inspection under anesthesia revealed a severe labial adhesions, complete obliteration of the labia majora, minora, clitoral hood all being completely fused to the area of the rectum with only an approximately small 4-mm opening for urine.  Very careful lysis of adhesion was performed in the midline with hemostats opening the labia majora and minora allowing exposure of the clitoris and clitoral hood and the proper vaginal canal and urethral meatus.  Bimanual pelvic exam was performed, which revealed surgical absence of the uterus and cervix.  No pelvic masses noted, no prolapse noted.  Additional prep was performed using iodine into the vagina, introitus and proximal thighs.  Next, cystourethroscopy was performed using a 16-French flexible cystoscope. This revealed unremarkable urinary bladder, no trabeculation.  Ureteral orifices were singleton bilaterally.  No papillary lesions or calcifications noted.  Retroflexion revealed no additional findings. Given the impressive adhesions, it was felt that interval vaginal packing would be warranted.  As such, 1-inch gauze was soaked in half of the tube of Estrace cream.  This was carefully placed living approximately 4-inch tail proceeding to vagina for the patient to remove.  Following these maneuvers, hemostasis was excellent.  All counts were correct.  The patient was taken to the postanesthesia care unit in  stable condition.          ______________________________ Alexis Frock, MD     TM/MEDQ  D:  06/23/2015  T:  06/23/2015  Job:  [id number]

## 2015-06-23 NOTE — Progress Notes (Signed)
Patient does not have to stay in PACU 2 hours-due to sleep apnea history- and wearing CPAP- per Dr. Delma Post

## 2015-06-24 NOTE — Anesthesia Postprocedure Evaluation (Signed)
Anesthesia Post Note  Patient: Lynn Dunn  Procedure(s) Performed: Procedure(s) (LRB): LYSIS OF LABIAL ADHESION (N/A) CYSTOSCOPY (N/A) EXAM UNDER ANESTHESIA (N/A)  Patient location during evaluation: PACU Anesthesia Type: General Level of consciousness: awake and alert Pain management: pain level controlled Vital Signs Assessment: post-procedure vital signs reviewed and stable Respiratory status: spontaneous breathing, nonlabored ventilation, respiratory function stable and patient connected to nasal cannula oxygen Cardiovascular status: blood pressure returned to baseline and stable Postop Assessment: no signs of nausea or vomiting Anesthetic complications: no    Last Vitals:  Filed Vitals:   06/23/15 1415 06/23/15 1432  BP: 171/74 155/72  Pulse: 63 65  Temp: 36.5 C 36.3 C  Resp: 13 14    Last Pain:  Filed Vitals:   06/23/15 1523  PainSc: 5                  [name]

## 2015-06-28 DIAGNOSIS — I4891 Unspecified atrial fibrillation: Secondary | ICD-10-CM | POA: Diagnosis not present

## 2015-06-28 DIAGNOSIS — Z7901 Long term (current) use of anticoagulants: Secondary | ICD-10-CM | POA: Diagnosis not present

## 2015-07-02 DIAGNOSIS — I4891 Unspecified atrial fibrillation: Secondary | ICD-10-CM | POA: Diagnosis not present

## 2015-07-02 DIAGNOSIS — Z7901 Long term (current) use of anticoagulants: Secondary | ICD-10-CM | POA: Diagnosis not present

## 2015-07-12 DIAGNOSIS — N3946 Mixed incontinence: Secondary | ICD-10-CM | POA: Diagnosis not present

## 2015-07-12 DIAGNOSIS — R3121 Asymptomatic microscopic hematuria: Secondary | ICD-10-CM | POA: Diagnosis not present

## 2015-07-12 DIAGNOSIS — N9089 Other specified noninflammatory disorders of vulva and perineum: Secondary | ICD-10-CM | POA: Diagnosis not present

## 2015-07-12 DIAGNOSIS — Z0000 Encounter for general adult medical examination without abnormal findings: Secondary | ICD-10-CM | POA: Diagnosis not present

## 2015-07-12 DIAGNOSIS — N952 Postmenopausal atrophic vaginitis: Secondary | ICD-10-CM | POA: Diagnosis not present

## 2015-07-16 DIAGNOSIS — Z7901 Long term (current) use of anticoagulants: Secondary | ICD-10-CM | POA: Diagnosis not present

## 2015-08-13 DIAGNOSIS — Z7901 Long term (current) use of anticoagulants: Secondary | ICD-10-CM | POA: Diagnosis not present

## 2015-08-13 DIAGNOSIS — N183 Chronic kidney disease, stage 3 (moderate): Secondary | ICD-10-CM | POA: Diagnosis not present

## 2015-08-13 DIAGNOSIS — E1122 Type 2 diabetes mellitus with diabetic chronic kidney disease: Secondary | ICD-10-CM | POA: Diagnosis not present

## 2015-08-13 DIAGNOSIS — I480 Paroxysmal atrial fibrillation: Secondary | ICD-10-CM | POA: Diagnosis not present

## 2015-08-13 DIAGNOSIS — E785 Hyperlipidemia, unspecified: Secondary | ICD-10-CM | POA: Diagnosis not present

## 2015-08-13 DIAGNOSIS — I10 Essential (primary) hypertension: Secondary | ICD-10-CM | POA: Diagnosis not present

## 2015-08-24 ENCOUNTER — Other Ambulatory Visit: Payer: Self-pay

## 2015-08-24 ENCOUNTER — Other Ambulatory Visit: Payer: Self-pay | Admitting: Interventional Cardiology

## 2015-08-24 MED ORDER — METOPROLOL TARTRATE 25 MG PO TABS: 25.0000 mg | ORAL_TABLET | Freq: Two times a day (BID) | ORAL | Status: DC

## 2015-08-31 ENCOUNTER — Other Ambulatory Visit: Payer: Self-pay | Admitting: *Deleted

## 2015-08-31 MED ORDER — METOPROLOL TARTRATE 25 MG PO TABS: ORAL_TABLET | ORAL | Status: DC

## 2015-08-31 NOTE — Telephone Encounter (Signed)
Thirty day supply sent to local pharmacy per patients request until her mail order rx arrives.

## 2015-09-01 ENCOUNTER — Ambulatory Visit (INDEPENDENT_AMBULATORY_CARE_PROVIDER_SITE_OTHER): Payer: Medicare Other | Admitting: *Deleted

## 2015-09-01 ENCOUNTER — Telehealth: Payer: Self-pay | Admitting: Cardiology

## 2015-09-01 DIAGNOSIS — I495 Sick sinus syndrome: Secondary | ICD-10-CM | POA: Diagnosis not present

## 2015-09-01 NOTE — Telephone Encounter (Signed)
LMOVM reminding pt to send remote transmission.   

## 2015-09-01 NOTE — Progress Notes (Signed)
Remote pacemaker transmission.   

## 2015-09-02 LAB — CUP PACEART REMOTE DEVICE CHECK
Battery Remaining Longevity: 128 mo
Brady Statistic AP VS Percent: 2.05 %
Brady Statistic AS VS Percent: 97.88 %
Brady Statistic RV Percent Paced: 0.07 %
Implantable Lead Implant Date: 20161214
Implantable Lead Location: 753860
Implantable Lead Model: 5076
Implantable Lead Model: 5076
Lead Channel Impedance Value: 361 Ohm
Lead Channel Impedance Value: 646 Ohm
Lead Channel Pacing Threshold Amplitude: 1.375 V
Lead Channel Pacing Threshold Pulse Width: 0.4 ms
Lead Channel Sensing Intrinsic Amplitude: 15.5 mV
Lead Channel Sensing Intrinsic Amplitude: 3.125 mV
Lead Channel Sensing Intrinsic Amplitude: 3.125 mV
Lead Channel Setting Pacing Amplitude: 2.5 V
Lead Channel Setting Sensing Sensitivity: 2.8 mV
MDC IDC LEAD IMPLANT DT: 20161214
MDC IDC LEAD LOCATION: 753859
MDC IDC MSMT BATTERY VOLTAGE: 3.04 V
MDC IDC MSMT LEADCHNL RA IMPEDANCE VALUE: 513 Ohm
MDC IDC MSMT LEADCHNL RA PACING THRESHOLD PULSEWIDTH: 0.4 ms
MDC IDC MSMT LEADCHNL RV IMPEDANCE VALUE: 532 Ohm
MDC IDC MSMT LEADCHNL RV PACING THRESHOLD AMPLITUDE: 1 V
MDC IDC MSMT LEADCHNL RV SENSING INTR AMPL: 15.5 mV
MDC IDC SESS DTM: 20170628190909
MDC IDC SET LEADCHNL RA PACING AMPLITUDE: 3 V
MDC IDC SET LEADCHNL RV PACING PULSEWIDTH: 0.4 ms
MDC IDC STAT BRADY AP VP PERCENT: 0.01 %
MDC IDC STAT BRADY AS VP PERCENT: 0.07 %
MDC IDC STAT BRADY RA PERCENT PACED: 2.05 %

## 2015-09-03 ENCOUNTER — Encounter: Payer: Self-pay | Admitting: Cardiology

## 2015-09-10 DIAGNOSIS — Z7901 Long term (current) use of anticoagulants: Secondary | ICD-10-CM | POA: Diagnosis not present

## 2015-09-17 ENCOUNTER — Encounter: Payer: Self-pay | Admitting: Cardiology

## 2015-09-28 ENCOUNTER — Other Ambulatory Visit: Payer: Self-pay | Admitting: Interventional Cardiology

## 2015-09-29 NOTE — Telephone Encounter (Signed)
Lynn Sheller, MD at 06/02/2015 10:23 AM   metoprolol tartrate (LOPRESSOR) 25 MG tablet TAKE 1 TABLET BY MOUTH TWICE DAILY.  MAY TAKE ONE EXTRA 25MG  TABLET DAILY IF YOU FEEL YOUR HEART IS OUT OF RHYTHM.  Patient Instructions   Medication Instructions:  Your physician recommends that you continue on your current medications as directed. Please refer to the Current Medication list given to you today.

## 2015-09-30 ENCOUNTER — Other Ambulatory Visit: Payer: Self-pay | Admitting: *Deleted

## 2015-09-30 MED ORDER — METOPROLOL TARTRATE 25 MG PO TABS: 25.0000 mg | ORAL_TABLET | Freq: Two times a day (BID) | ORAL | 0 refills | Status: DC

## 2015-10-08 DIAGNOSIS — I480 Paroxysmal atrial fibrillation: Secondary | ICD-10-CM | POA: Diagnosis not present

## 2015-10-08 DIAGNOSIS — Z7901 Long term (current) use of anticoagulants: Secondary | ICD-10-CM | POA: Diagnosis not present

## 2015-10-15 DIAGNOSIS — Z7901 Long term (current) use of anticoagulants: Secondary | ICD-10-CM | POA: Diagnosis not present

## 2015-10-15 DIAGNOSIS — I480 Paroxysmal atrial fibrillation: Secondary | ICD-10-CM | POA: Diagnosis not present

## 2015-11-01 DIAGNOSIS — I480 Paroxysmal atrial fibrillation: Secondary | ICD-10-CM | POA: Diagnosis not present

## 2015-11-01 DIAGNOSIS — Z7901 Long term (current) use of anticoagulants: Secondary | ICD-10-CM | POA: Diagnosis not present

## 2015-11-09 DIAGNOSIS — Z7901 Long term (current) use of anticoagulants: Secondary | ICD-10-CM | POA: Diagnosis not present

## 2015-11-09 DIAGNOSIS — I480 Paroxysmal atrial fibrillation: Secondary | ICD-10-CM | POA: Diagnosis not present

## 2015-11-11 ENCOUNTER — Encounter: Payer: Self-pay | Admitting: Interventional Cardiology

## 2015-11-17 DIAGNOSIS — Z23 Encounter for immunization: Secondary | ICD-10-CM | POA: Diagnosis not present

## 2015-11-24 NOTE — Progress Notes (Signed)
Cardiology Office Note   Date:  11/25/2015   ID:  Lynn Dunn, DOB Sep 13, 1930, MRN [id number]  PCP:  Lynn Melter, MD    No chief complaint on file. AFib   Wt Readings from Last 3 Encounters:  11/25/15 202 lb (91.6 kg)  06/23/15 198 lb (89.8 kg)  06/17/15 198 lb (89.8 kg)       History of Present Illness: Lynn Dunn is a 82 y.o. female  Who has had AFib and CVA in the past, treated with TPA. She has had atrial fibrillation ablation in the past.   She had a loop monitor in place due to syncope/near syncope.  She had a pacer placed.  She reports right ankle swelling. It feels like a mass on the inside of her right ankle, turned out to be a cyst by ultrasound.  It does decrease in size in the morning after she has had her feet up.  Unfortunately, her husband passed away in 05/05/2015 from aortic stenosis.  She had knee surgery in 05-04-14.  Her right knee is getting owrse.  SHe is exercising three 3 x/week.  She goes for 45 -60 minutes at a time.  She does a class, with  Weights and stretch bands.  She has felt some AFib a few times since her husband passed away, but they resolved after 30 minutes on their own or with extra metoprolol.     Past Medical History:  Diagnosis Date  . Anemia   . Anginal pain ([place]) 2009-05-04   had to take nitro   . Arthritis   . COPD (chronic obstructive pulmonary disease) ([place])   . Dysuria   . GERD (gastroesophageal reflux disease)   . Heart murmur   . Hematuria   . HTN (hypertension)   . Hx of echocardiogram    Echo (12/15):  Mild LVH, EF 60-65%, mild MR, mod LAE (LA 49 mm), PASP 43 mmHg  . Obesity   . OSA (obstructive sleep apnea)    cpap at home  . PAF (paroxysmal atrial fibrillation) ([place])    s/p ablation 07/29/09  . Sick sinus syndrome ([facility])    pauses with syncope  . Stroke ([place]) 6/09   NO RESIDUAL PROBLEMS    Past Surgical History:  Procedure Laterality Date  . ABDOMINAL HYSTERECTOMY    . ATRIAL ABLATION SURGERY     s/p  ablation for afib 07/29/09  . CYSTOSCOPY N/A 06/23/2015   Procedure: CYSTOSCOPY;  Surgeon: Alexis Frock, MD;  Location: [facility];  Service: Urology;  Laterality: N/A;  . EP IMPLANTABLE DEVICE N/A 02/17/2015   MDT Advisa MRI PPM implanted by Dr Lovena Le for sick sinus and syncope  . EP IMPLANTABLE DEVICE N/A 02/17/2015   Procedure: Loop Recorder Removal;  Surgeon: Evans Lance, MD;  Location: [place] CV LAB;  Service: Cardiovascular;  Laterality: N/A;  . ESOPHAGOGASTRODUODENOSCOPY  01/24/2012   Procedure: ESOPHAGOGASTRODUODENOSCOPY (EGD);  Surgeon: Jeryl Columbia, MD;  Location: [facility] ENDOSCOPY;  Service: Endoscopy;  Laterality: N/A;  . KNEE ARTHROSCOPY     BILATERAL  . LOOP RECORDER IMPLANT N/A 03/12/2014   Procedure: LOOP RECORDER IMPLANT;  Surgeon: Thompson Grayer, MD;  Location: [facility] CATH LAB;  Service: Cardiovascular;  Laterality: N/A;  . LYSIS OF ADHESION N/A 06/23/2015   Procedure: LYSIS OF LABIAL ADHESION;  Surgeon: Alexis Frock, MD;  Location: [facility];  Service: Urology;  Laterality: N/A;     Current Outpatient Prescriptions  Medication Sig Dispense Refill  . Cholecalciferol (  VITAMIN D PO) Take 2 capsules by mouth every morning.     . Coenzyme Q10 (COQ10 PO) Take 1 capsule by mouth every morning.     Marland Kitchen estradiol (ESTRACE VAGINAL) 0.1 MG/GM vaginal cream Apply pea-sized amount to vagina / urethra 2-3 x weekly to prevent labial adhesions 42.5 g 12  . ezetimibe (ZETIA) 10 MG tablet Take 10 mg by mouth daily.     . furosemide (LASIX) 40 MG tablet Take 1 tablet (40 mg total) by mouth 2 (two) times daily. 180 tablet 3  . KRILL OIL OMEGA-3 PO Take 1 capsule by mouth twice daily.    . Magnesium 100 MG CAPS Take 1 capsule by mouth 2 (two) times daily.     . metoprolol tartrate (LOPRESSOR) 25 MG tablet Take 1 tablet (25 mg total) by mouth 2 (two) times daily. 30 tablet 0  . potassium chloride (K-DUR) 10 MEQ tablet Take 10 mEq by mouth every morning.     . ranitidine (ZANTAC) 150 MG capsule Take 150  mg by mouth daily as needed for heartburn.     . rosuvastatin (CRESTOR) 20 MG tablet Take 20 mg by mouth every morning.     . traMADol (ULTRAM) 50 MG tablet Take 1-2 tablets (50-100 mg total) by mouth every 6 (six) hours as needed for moderate pain. Post-operatively 30 tablet 0  . warfarin (COUMADIN) 1 MG tablet Take 1 mg by mouth 2 (two) times a week. Takes on Wednesday and Sunday only for a total dose of 97m dose.    . warfarin (COUMADIN) 3 MG tablet Take 3 mg by mouth daily. Takes an additonal 1 mg on Wednesday and Sunday. Entered in computer.     No current facility-administered medications for this visit.     Allergies:   Ace inhibitors; Albuterol; Albuterol sulfate; and Sulfa antibiotics    Social History:  The patient  reports that she has never smoked. She has never used smokeless tobacco. She reports that she does not drink alcohol or use drugs.   Family History:  The patient's family history includes Brain cancer in her father; Breast cancer in her mother; Hypertension in her sister; Stroke in her maternal aunt.    ROS:  Please see the history of present illness.   Otherwise, review of systems are positive for persistent SHOB.   All other systems are reviewed and negative.    PHYSICAL EXAM: VS:  BP 110/60   Pulse 67   Ht 5' 2" (1.575 m)   Wt 202 lb (91.6 kg)   BMI 36.95 kg/m  , BMI Body mass index is 36.95 kg/m. GEN: Well nourished, well developed, in no acute distress  HEENT: normal  Neck: no JVD, soft rightcarotid bruit, no masses Cardiac: RRR; 2/6 early systolic murmurs, no rubs, or gallops,no edema  Respiratory:  clear to auscultation bilaterally, normal work of breathing GI: soft, nontender, nondistended, + BS MS: no deformity or atrophy  Skin: warm and dry, no rash Neuro:  Strength and sensation are intact Psych: euthymic mood, full affect   EKG:   The ekg ordered today demonstrates NSR, poor R wave progression, no ST segment changes   Recent  Labs: 02/17/2015: BUN 15; Creatinine, Ser 1.02; Potassium 4.0; Sodium 137 06/17/2015: Hemoglobin 11.6; Platelets 235   Lipid Panel    Component Value Date/Time   CHOL 170 08/31/2014 0941   TRIG 162.0 (H) 08/31/2014 0941   HDL 51.80 08/31/2014 0941   CHOLHDL 3 08/31/2014 0941   VLDL  32.4 08/31/2014 0941   LDLCALC 86 08/31/2014 0941     Other studies Reviewed: Additional studies/ records that were reviewed today with results demonstrating: nomral LVEF by last echo.   ASSESSMENT AND PLAN:  1. AFib: Paroxysmal. Maintaining sinus rhythm. Continue current medications. Symptoms are infrequent and well controlled with additional metoprolol as needed. 2. Hyperlipidemia She stopped Zetia.  She has been out of her rosuvastatin.  Restart rosuvastatin.  Check lipids and liver tests in 3 months.  3. Shortness of breath: Check be met and BNP to look for excess fluid and her system. Continue regular exercise. 4. History of carotid artery disease: It's been several years she had a carotid Doppler. Will recheck at this time. 5. Mitral regurgitation: mild by 2015 echo.   Current medicines are reviewed at length with the patient today.  The patient concerns regarding her medicines were addressed.  The following changes have been made:  No change  Labs/ tests ordered today include:  No orders of the defined types were placed in this encounter.   Recommend 150 minutes/week of aerobic exercise Low fat, low carb, high fiber diet recommended  Disposition:   FU in 1 year   Signed, Larae Grooms, MD  11/25/2015 10:49 AM    [facility] [place], [place], [place]  [address] Phone: [phone]; Fax: [phone]

## 2015-11-25 ENCOUNTER — Ambulatory Visit (INDEPENDENT_AMBULATORY_CARE_PROVIDER_SITE_OTHER): Payer: Medicare Other | Admitting: Interventional Cardiology

## 2015-11-25 ENCOUNTER — Encounter: Payer: Self-pay | Admitting: Interventional Cardiology

## 2015-11-25 VITALS — BP 110/60 | HR 67 | Ht 62.0 in | Wt 202.0 lb

## 2015-11-25 DIAGNOSIS — R0602 Shortness of breath: Secondary | ICD-10-CM

## 2015-11-25 DIAGNOSIS — I739 Peripheral vascular disease, unspecified: Secondary | ICD-10-CM

## 2015-11-25 DIAGNOSIS — E782 Mixed hyperlipidemia: Secondary | ICD-10-CM

## 2015-11-25 DIAGNOSIS — I480 Paroxysmal atrial fibrillation: Secondary | ICD-10-CM

## 2015-11-25 DIAGNOSIS — I779 Disorder of arteries and arterioles, unspecified: Secondary | ICD-10-CM | POA: Diagnosis not present

## 2015-11-25 LAB — BASIC METABOLIC PANEL
BUN: 29 mg/dL — AB (ref 7–25)
CO2: 27 mmol/L (ref 20–31)
CREATININE: 1.08 mg/dL — AB (ref 0.60–0.88)
Calcium: 8.6 mg/dL (ref 8.6–10.4)
Chloride: 103 mmol/L (ref 98–110)
GLUCOSE: 120 mg/dL — AB (ref 65–99)
Potassium: 3.9 mmol/L (ref 3.5–5.3)
Sodium: 140 mmol/L (ref 135–146)

## 2015-11-25 LAB — BRAIN NATRIURETIC PEPTIDE: Brain Natriuretic Peptide: 99.5 pg/mL (ref <100)

## 2015-11-25 MED ORDER — ROSUVASTATIN CALCIUM 20 MG PO TABS: 20.0000 mg | ORAL_TABLET | Freq: Every morning | ORAL | 3 refills | Status: DC

## 2015-11-25 NOTE — Patient Instructions (Signed)
**Note De-Identified Lynn Dunn Obfuscation** Medication Instructions:  Same-no changes  Labwork: Today-BNP and BMET. In 3 months Lipids and CMET. Please do not eat or drink after midnight the night before labs are drawn.  Testing/Procedures: Your physician has requested that you have a carotid duplex. This test is an ultrasound of the carotid arteries in your neck. It looks at blood flow through these arteries that supply the brain with blood. Allow one hour for this exam. There are no restrictions or special instructions.   Follow-Up: Your physician wants you to follow-up in: 1 year. You will receive a reminder letter in the mail two months in advance. If you don't receive a letter, please call our office to schedule the follow-up appointment.     If you need a refill on your cardiac medications before your next appointment, please call your pharmacy.

## 2015-11-29 ENCOUNTER — Telehealth: Payer: Self-pay | Admitting: Interventional Cardiology

## 2015-11-29 NOTE — Telephone Encounter (Signed)
Lynn Dunn is returning a call about some lab work , Please call on her cell ph#

## 2015-11-29 NOTE — Telephone Encounter (Signed)
Notified of lab results.  States she does not know how to access My Chart.

## 2015-12-01 ENCOUNTER — Telehealth: Payer: Self-pay | Admitting: Cardiology

## 2015-12-01 ENCOUNTER — Encounter: Payer: Medicare Other | Admitting: *Deleted

## 2015-12-01 NOTE — Telephone Encounter (Signed)
LMOVM reminding pt to send remote transmission.   

## 2015-12-03 ENCOUNTER — Encounter: Payer: Self-pay | Admitting: Cardiology

## 2015-12-03 NOTE — Progress Notes (Signed)
Letter  

## 2015-12-07 ENCOUNTER — Ambulatory Visit (HOSPITAL_COMMUNITY)
Admission: RE | Admit: 2015-12-07 | Discharge: 2015-12-07 | Disposition: A | Discharge status: Home / Self Care | Payer: Medicare Other | Source: Ambulatory Visit | Attending: Interventional Cardiology | Admitting: Interventional Cardiology

## 2015-12-07 DIAGNOSIS — Z8673 Personal history of transient ischemic attack (TIA), and cerebral infarction without residual deficits: Secondary | ICD-10-CM | POA: Diagnosis not present

## 2015-12-07 DIAGNOSIS — I6523 Occlusion and stenosis of bilateral carotid arteries: Secondary | ICD-10-CM | POA: Insufficient documentation

## 2015-12-07 DIAGNOSIS — I779 Disorder of arteries and arterioles, unspecified: Secondary | ICD-10-CM

## 2015-12-07 DIAGNOSIS — I739 Peripheral vascular disease, unspecified: Secondary | ICD-10-CM

## 2015-12-07 DIAGNOSIS — J449 Chronic obstructive pulmonary disease, unspecified: Secondary | ICD-10-CM | POA: Diagnosis not present

## 2015-12-07 DIAGNOSIS — I10 Essential (primary) hypertension: Secondary | ICD-10-CM | POA: Diagnosis not present

## 2015-12-07 DIAGNOSIS — Z7901 Long term (current) use of anticoagulants: Secondary | ICD-10-CM | POA: Diagnosis not present

## 2015-12-07 DIAGNOSIS — I480 Paroxysmal atrial fibrillation: Secondary | ICD-10-CM | POA: Diagnosis not present

## 2015-12-11 ENCOUNTER — Other Ambulatory Visit: Payer: Self-pay | Admitting: Interventional Cardiology

## 2015-12-13 ENCOUNTER — Ambulatory Visit (INDEPENDENT_AMBULATORY_CARE_PROVIDER_SITE_OTHER): Payer: Medicare Other | Admitting: *Deleted

## 2015-12-13 DIAGNOSIS — I495 Sick sinus syndrome: Secondary | ICD-10-CM | POA: Diagnosis not present

## 2015-12-14 NOTE — Progress Notes (Signed)
Remote pacemaker transmission.   

## 2015-12-23 DIAGNOSIS — L209 Atopic dermatitis, unspecified: Secondary | ICD-10-CM | POA: Diagnosis not present

## 2015-12-23 DIAGNOSIS — Z7901 Long term (current) use of anticoagulants: Secondary | ICD-10-CM | POA: Diagnosis not present

## 2015-12-23 DIAGNOSIS — R21 Rash and other nonspecific skin eruption: Secondary | ICD-10-CM | POA: Diagnosis not present

## 2015-12-23 DIAGNOSIS — I4891 Unspecified atrial fibrillation: Secondary | ICD-10-CM | POA: Diagnosis not present

## 2015-12-23 DIAGNOSIS — E1122 Type 2 diabetes mellitus with diabetic chronic kidney disease: Secondary | ICD-10-CM | POA: Diagnosis not present

## 2016-01-13 DIAGNOSIS — Z7901 Long term (current) use of anticoagulants: Secondary | ICD-10-CM | POA: Diagnosis not present

## 2016-01-16 LAB — CUP PACEART REMOTE DEVICE CHECK
Battery Remaining Longevity: 124 mo
Battery Voltage: 3.03 V
Brady Statistic AP VP Percent: 0.01 %
Brady Statistic AS VS Percent: 98.7 %
Implantable Lead Implant Date: 20161214
Implantable Lead Model: 5076
Implantable Pulse Generator Implant Date: 20161214
Lead Channel Impedance Value: 361 Ohm
Lead Channel Impedance Value: 494 Ohm
Lead Channel Impedance Value: 551 Ohm
Lead Channel Pacing Threshold Amplitude: 1 V
Lead Channel Pacing Threshold Amplitude: 1.125 V
Lead Channel Pacing Threshold Pulse Width: 0.4 ms
Lead Channel Sensing Intrinsic Amplitude: 3.875 mV
Lead Channel Sensing Intrinsic Amplitude: 3.875 mV
MDC IDC LEAD IMPLANT DT: 20161214
MDC IDC LEAD LOCATION: 753859
MDC IDC LEAD LOCATION: 753860
MDC IDC MSMT LEADCHNL RA PACING THRESHOLD PULSEWIDTH: 0.4 ms
MDC IDC MSMT LEADCHNL RV IMPEDANCE VALUE: 665 Ohm
MDC IDC MSMT LEADCHNL RV SENSING INTR AMPL: 17 mV
MDC IDC MSMT LEADCHNL RV SENSING INTR AMPL: 17 mV
MDC IDC SESS DTM: 20171009110944
MDC IDC SET LEADCHNL RA PACING AMPLITUDE: 2.5 V
MDC IDC SET LEADCHNL RV PACING AMPLITUDE: 2.5 V
MDC IDC SET LEADCHNL RV PACING PULSEWIDTH: 0.4 ms
MDC IDC SET LEADCHNL RV SENSING SENSITIVITY: 2.8 mV
MDC IDC STAT BRADY AP VS PERCENT: 1.22 %
MDC IDC STAT BRADY AS VP PERCENT: 0.07 %
MDC IDC STAT BRADY RA PERCENT PACED: 1.23 %
MDC IDC STAT BRADY RV PERCENT PACED: 0.08 %

## 2016-01-16 NOTE — Progress Notes (Signed)
Normal remote reviewed. 1% AF, V rates slightly elevated, +Warfarin.  Next [facility] 03/13/16

## 2016-01-17 DIAGNOSIS — D225 Melanocytic nevi of trunk: Secondary | ICD-10-CM | POA: Diagnosis not present

## 2016-01-17 DIAGNOSIS — Z85828 Personal history of other malignant neoplasm of skin: Secondary | ICD-10-CM | POA: Diagnosis not present

## 2016-01-17 DIAGNOSIS — L821 Other seborrheic keratosis: Secondary | ICD-10-CM | POA: Diagnosis not present

## 2016-01-17 DIAGNOSIS — Z08 Encounter for follow-up examination after completed treatment for malignant neoplasm: Secondary | ICD-10-CM | POA: Diagnosis not present

## 2016-01-17 DIAGNOSIS — L570 Actinic keratosis: Secondary | ICD-10-CM | POA: Diagnosis not present

## 2016-01-20 DIAGNOSIS — Z7901 Long term (current) use of anticoagulants: Secondary | ICD-10-CM | POA: Diagnosis not present

## 2016-01-20 DIAGNOSIS — I480 Paroxysmal atrial fibrillation: Secondary | ICD-10-CM | POA: Diagnosis not present

## 2016-02-17 DIAGNOSIS — Z7901 Long term (current) use of anticoagulants: Secondary | ICD-10-CM | POA: Diagnosis not present

## 2016-02-17 DIAGNOSIS — I4891 Unspecified atrial fibrillation: Secondary | ICD-10-CM | POA: Diagnosis not present

## 2016-02-19 DIAGNOSIS — M1711 Unilateral primary osteoarthritis, right knee: Secondary | ICD-10-CM | POA: Diagnosis not present

## 2016-02-21 ENCOUNTER — Other Ambulatory Visit: Payer: Medicare Other

## 2016-02-21 ENCOUNTER — Telehealth: Payer: Self-pay | Admitting: Interventional Cardiology

## 2016-02-21 DIAGNOSIS — R0602 Shortness of breath: Secondary | ICD-10-CM | POA: Diagnosis not present

## 2016-02-21 DIAGNOSIS — J449 Chronic obstructive pulmonary disease, unspecified: Secondary | ICD-10-CM | POA: Diagnosis not present

## 2016-02-21 DIAGNOSIS — E785 Hyperlipidemia, unspecified: Secondary | ICD-10-CM | POA: Diagnosis not present

## 2016-02-21 NOTE — Telephone Encounter (Signed)
Pt is calling because she was suppose to have blood work this morning but she had car problems she will be going to [facility] and they will send a copy of blood results

## 2016-02-21 NOTE — Telephone Encounter (Signed)
**Note De-Identified [facility] Obfuscation** Dr Irish Lack is aware.

## 2016-02-23 ENCOUNTER — Telehealth: Payer: Self-pay | Admitting: Interventional Cardiology

## 2016-02-23 NOTE — Telephone Encounter (Signed)
Per Lebron Conners, RPH the pt is advised that it is ok for her to take Symbicort along with her other medications. She verbalized understanding.

## 2016-02-23 NOTE — Telephone Encounter (Signed)
New Message:   Pt went to her family doctor yesterday. He prescribed her Symbicort,she wants to know will this be alright for her to take a long with all her other medicine?

## 2016-03-07 DIAGNOSIS — M1711 Unilateral primary osteoarthritis, right knee: Secondary | ICD-10-CM | POA: Diagnosis not present

## 2016-03-13 ENCOUNTER — Telehealth: Payer: Self-pay | Admitting: Cardiology

## 2016-03-13 ENCOUNTER — Ambulatory Visit (INDEPENDENT_AMBULATORY_CARE_PROVIDER_SITE_OTHER): Payer: Medicare Other | Admitting: *Deleted

## 2016-03-13 DIAGNOSIS — I495 Sick sinus syndrome: Secondary | ICD-10-CM

## 2016-03-13 NOTE — Telephone Encounter (Signed)
LMOVM reminding pt to send remote transmission.   

## 2016-03-14 DIAGNOSIS — M1711 Unilateral primary osteoarthritis, right knee: Secondary | ICD-10-CM | POA: Diagnosis not present

## 2016-03-14 NOTE — Progress Notes (Signed)
Remote pacemaker transmission.   

## 2016-03-15 ENCOUNTER — Encounter: Payer: Self-pay | Admitting: Cardiology

## 2016-03-16 DIAGNOSIS — I4891 Unspecified atrial fibrillation: Secondary | ICD-10-CM | POA: Diagnosis not present

## 2016-03-16 DIAGNOSIS — Z7901 Long term (current) use of anticoagulants: Secondary | ICD-10-CM | POA: Diagnosis not present

## 2016-03-16 LAB — CUP PACEART REMOTE DEVICE CHECK
Battery Remaining Longevity: 120 mo
Battery Voltage: 3.03 V
Brady Statistic AP VP Percent: 0.01 %
Brady Statistic RA Percent Paced: 0.28 %
Brady Statistic RV Percent Paced: 0.07 %
Implantable Lead Implant Date: 20161214
Implantable Lead Location: 753859
Implantable Lead Location: 753860
Implantable Pulse Generator Implant Date: 20161214
Lead Channel Impedance Value: 418 Ohm
Lead Channel Impedance Value: 551 Ohm
Lead Channel Impedance Value: 665 Ohm
Lead Channel Pacing Threshold Amplitude: 0.875 V
Lead Channel Pacing Threshold Pulse Width: 0.4 ms
Lead Channel Setting Pacing Amplitude: 2 V
Lead Channel Setting Pacing Amplitude: 2.5 V
Lead Channel Setting Sensing Sensitivity: 2.8 mV
MDC IDC LEAD IMPLANT DT: 20161214
MDC IDC MSMT LEADCHNL RA IMPEDANCE VALUE: 380 Ohm
MDC IDC MSMT LEADCHNL RA SENSING INTR AMPL: 3.5 mV
MDC IDC MSMT LEADCHNL RA SENSING INTR AMPL: 3.5 mV
MDC IDC MSMT LEADCHNL RV PACING THRESHOLD AMPLITUDE: 1.125 V
MDC IDC MSMT LEADCHNL RV PACING THRESHOLD PULSEWIDTH: 0.4 ms
MDC IDC MSMT LEADCHNL RV SENSING INTR AMPL: 16.75 mV
MDC IDC MSMT LEADCHNL RV SENSING INTR AMPL: 16.75 mV
MDC IDC SESS DTM: 20180109022636
MDC IDC SET LEADCHNL RV PACING PULSEWIDTH: 0.4 ms
MDC IDC STAT BRADY AP VS PERCENT: 0.28 %
MDC IDC STAT BRADY AS VP PERCENT: 0.06 %
MDC IDC STAT BRADY AS VS PERCENT: 99.65 %

## 2016-03-21 DIAGNOSIS — E1122 Type 2 diabetes mellitus with diabetic chronic kidney disease: Secondary | ICD-10-CM | POA: Diagnosis not present

## 2016-03-21 DIAGNOSIS — M1711 Unilateral primary osteoarthritis, right knee: Secondary | ICD-10-CM | POA: Diagnosis not present

## 2016-03-21 DIAGNOSIS — I10 Essential (primary) hypertension: Secondary | ICD-10-CM | POA: Diagnosis not present

## 2016-03-21 DIAGNOSIS — E669 Obesity, unspecified: Secondary | ICD-10-CM | POA: Diagnosis not present

## 2016-03-21 DIAGNOSIS — E785 Hyperlipidemia, unspecified: Secondary | ICD-10-CM | POA: Diagnosis not present

## 2016-03-21 DIAGNOSIS — J449 Chronic obstructive pulmonary disease, unspecified: Secondary | ICD-10-CM | POA: Diagnosis not present

## 2016-03-21 DIAGNOSIS — M25561 Pain in right knee: Secondary | ICD-10-CM | POA: Diagnosis not present

## 2016-03-28 DIAGNOSIS — M1711 Unilateral primary osteoarthritis, right knee: Secondary | ICD-10-CM | POA: Diagnosis not present

## 2016-03-29 ENCOUNTER — Encounter: Payer: Self-pay | Admitting: Cardiology

## 2016-03-30 DIAGNOSIS — I4891 Unspecified atrial fibrillation: Secondary | ICD-10-CM | POA: Diagnosis not present

## 2016-03-30 DIAGNOSIS — Z7901 Long term (current) use of anticoagulants: Secondary | ICD-10-CM | POA: Diagnosis not present

## 2016-04-03 DIAGNOSIS — J441 Chronic obstructive pulmonary disease with (acute) exacerbation: Secondary | ICD-10-CM | POA: Diagnosis not present

## 2016-04-04 ENCOUNTER — Encounter (HOSPITAL_BASED_OUTPATIENT_CLINIC_OR_DEPARTMENT_OTHER): Payer: Self-pay | Admitting: *Deleted

## 2016-04-04 ENCOUNTER — Emergency Department (HOSPITAL_BASED_OUTPATIENT_CLINIC_OR_DEPARTMENT_OTHER): Payer: Medicare Other

## 2016-04-04 ENCOUNTER — Observation Stay (HOSPITAL_BASED_OUTPATIENT_CLINIC_OR_DEPARTMENT_OTHER)
Admission: EM | Admit: 2016-04-04 | Discharge: 2016-04-05 | Disposition: A | Discharge status: Home / Self Care | Payer: Medicare Other | Attending: Family Medicine | Admitting: Family Medicine

## 2016-04-04 DIAGNOSIS — J441 Chronic obstructive pulmonary disease with (acute) exacerbation: Secondary | ICD-10-CM | POA: Insufficient documentation

## 2016-04-04 DIAGNOSIS — G4733 Obstructive sleep apnea (adult) (pediatric): Secondary | ICD-10-CM | POA: Insufficient documentation

## 2016-04-04 DIAGNOSIS — I4891 Unspecified atrial fibrillation: Secondary | ICD-10-CM | POA: Diagnosis not present

## 2016-04-04 DIAGNOSIS — E876 Hypokalemia: Secondary | ICD-10-CM | POA: Insufficient documentation

## 2016-04-04 DIAGNOSIS — Z79899 Other long term (current) drug therapy: Secondary | ICD-10-CM | POA: Diagnosis not present

## 2016-04-04 DIAGNOSIS — R002 Palpitations: Secondary | ICD-10-CM | POA: Diagnosis present

## 2016-04-04 DIAGNOSIS — N183 Chronic kidney disease, stage 3 (moderate): Secondary | ICD-10-CM | POA: Diagnosis not present

## 2016-04-04 DIAGNOSIS — Z7901 Long term (current) use of anticoagulants: Secondary | ICD-10-CM | POA: Insufficient documentation

## 2016-04-04 DIAGNOSIS — I129 Hypertensive chronic kidney disease with stage 1 through stage 4 chronic kidney disease, or unspecified chronic kidney disease: Secondary | ICD-10-CM | POA: Insufficient documentation

## 2016-04-04 DIAGNOSIS — I10 Essential (primary) hypertension: Secondary | ICD-10-CM | POA: Diagnosis present

## 2016-04-04 DIAGNOSIS — I480 Paroxysmal atrial fibrillation: Principal | ICD-10-CM | POA: Insufficient documentation

## 2016-04-04 DIAGNOSIS — R0602 Shortness of breath: Secondary | ICD-10-CM | POA: Diagnosis not present

## 2016-04-04 DIAGNOSIS — J449 Chronic obstructive pulmonary disease, unspecified: Secondary | ICD-10-CM | POA: Diagnosis not present

## 2016-04-04 DIAGNOSIS — Z5181 Encounter for therapeutic drug level monitoring: Secondary | ICD-10-CM | POA: Insufficient documentation

## 2016-04-04 DIAGNOSIS — R791 Abnormal coagulation profile: Secondary | ICD-10-CM | POA: Diagnosis not present

## 2016-04-04 LAB — CBC
HEMATOCRIT: 41.2 % (ref 36.0–46.0)
Hemoglobin: 13.2 g/dL (ref 12.0–15.0)
MCH: 30.1 pg (ref 26.0–34.0)
MCHC: 32 g/dL (ref 30.0–36.0)
MCV: 94.1 fL (ref 78.0–100.0)
PLATELETS: 252 10*3/uL (ref 150–400)
RBC: 4.38 MIL/uL (ref 3.87–5.11)
RDW: 15.8 % — AB (ref 11.5–15.5)
WBC: 8.1 10*3/uL (ref 4.0–10.5)

## 2016-04-04 LAB — BASIC METABOLIC PANEL
Anion gap: 12 (ref 5–15)
BUN: 26 mg/dL — AB (ref 6–20)
CALCIUM: 9.2 mg/dL (ref 8.9–10.3)
CO2: 28 mmol/L (ref 22–32)
Chloride: 99 mmol/L — ABNORMAL LOW (ref 101–111)
Creatinine, Ser: 1.03 mg/dL — ABNORMAL HIGH (ref 0.44–1.00)
GFR calc Af Amer: 57 mL/min — ABNORMAL LOW (ref >60)
GFR, EST NON AFRICAN AMERICAN: 49 mL/min — AB (ref >60)
GLUCOSE: 156 mg/dL — AB (ref 65–99)
Potassium: 3.7 mmol/L (ref 3.5–5.1)
Sodium: 139 mmol/L (ref 135–145)

## 2016-04-04 LAB — TROPONIN I: Troponin I: <0.03 ng/mL (ref <0.03)

## 2016-04-04 LAB — PROTIME-INR
INR: 1.94
Prothrombin Time: 22.4 seconds — ABNORMAL HIGH (ref 11.4–15.2)

## 2016-04-04 MED ORDER — METOPROLOL TARTRATE 50 MG PO TABS
25.0000 mg | ORAL_TABLET | Freq: Once | ORAL | Status: AC
  Administered 2016-04-04: 25 mg via ORAL
  Filled 2016-04-04: qty 1

## 2016-04-04 MED ORDER — METOPROLOL TARTRATE 50 MG PO TABS
50.0000 mg | ORAL_TABLET | Freq: Two times a day (BID) | ORAL | Status: DC
  Administered 2016-04-05: 50 mg via ORAL
  Filled 2016-04-04: qty 1

## 2016-04-04 MED ORDER — DILTIAZEM HCL 25 MG/5ML IV SOLN
INTRAVENOUS | Status: AC
  Filled 2016-04-04: qty 20

## 2016-04-04 MED ORDER — DILTIAZEM HCL 25 MG/5ML IV SOLN
10.0000 mg | Freq: Once | INTRAVENOUS | Status: AC
  Administered 2016-04-04: 10 mg via INTRAVENOUS
  Filled 2016-04-04: qty 5

## 2016-04-04 MED ORDER — DILTIAZEM HCL 25 MG/5ML IV SOLN
INTRAVENOUS | Status: AC
  Administered 2016-04-04: 100 mg
  Filled 2016-04-04: qty 25

## 2016-04-04 MED ORDER — DILTIAZEM HCL-DEXTROSE 100-5 MG/100ML-% IV SOLN (PREMIX)
5.0000 mg/h | INTRAVENOUS | Status: DC
  Administered 2016-04-04: 5 mg/h via INTRAVENOUS
  Filled 2016-04-04: qty 100

## 2016-04-04 NOTE — ED Triage Notes (Signed)
Irregular heart beat. States she feels like she is in atrial fib normally controlled with medications. She has a pacemaker.

## 2016-04-04 NOTE — ED Provider Notes (Signed)
Home DEPT [facility] Provider Note   CSN: [id number] Arrival date & time: 04/04/16  1805   By signing my name below, I, Lynn Dunn, attest that this documentation has been prepared under the direction and in the presence of [name], MD . Electronically Signed: Dolores Dunn, Scribe. 04/04/2016. 6:24 PM.  History   Chief Complaint Chief Complaint  Patient presents with  . Irregular Heart Beat   The history is provided by the patient. No language interpreter was used.    HPI Comments:  Lynn Dunn is a 83 y.o. female with pmhx of Afib, COPD and HTN who presents to the Emergency Department complaining of sudden-onset, constant heart palpitations onset about 1.5 hours ago. Pt states she was on the couch felt her blood pressure rising and felt her heart beating fast. She notes her current episode feels like periods of Afib she has had in the past. Pt reports associated SOB and chest tightness. She tried taking an extra dose of metoprolol after her symptoms began with minimal relief. Pt denies any light-headedness, vomiting, diarrhea, fever or nausea. Pt has a pacemaker. She notes she was seen by her PMD yesterday for a COPD exacerbation and was started on some new medications including azithromycin, prednisone and acetaminophen with codeine. Pt has been compliant with her Symbicort for one month but missed a dose yesterday. She reports she cannot tolerate albuterol as it exacerbates her Afib.  Past Medical History:  Diagnosis Date  . Anemia   . Anginal pain ([place]) 2011   had to take nitro   . Arthritis   . COPD (chronic obstructive pulmonary disease) ([place])   . Dysuria   . GERD (gastroesophageal reflux disease)   . Heart murmur   . Hematuria   . HTN (hypertension)   . Hx of echocardiogram    Echo (12/15):  Mild LVH, EF 60-65%, mild MR, mod LAE (LA 49 mm), PASP 43 mmHg  . Obesity   . OSA (obstructive sleep apnea)    cpap at home  . PAF (paroxysmal atrial fibrillation)  ([place])    s/p ablation 07/29/09  . Sick sinus syndrome ([facility])    pauses with syncope  . Stroke ([place]) 6/09   NO RESIDUAL PROBLEMS    Patient Active Problem List   Diagnosis Date Noted  . Atrial fibrillation with rapid ventricular response ([place]) 04/04/2016  . Carotid artery disease ([place]) 11/25/2015  . Pre-syncope 02/16/2015  . Sick sinus syndrome ([place]) 02/15/2015  . Swelling of right lower extremity 08/31/2014  . Mixed hyperlipidemia 12/27/2012  . Obesity 12/27/2012  . GI bleed 01/23/2012  . Anemia 01/23/2012  . History of CVA (cerebrovascular accident) 01/23/2012  . SHORTNESS OF BREATH (SOB) 09/13/2009  . Essential hypertension, benign 06/17/2009  . Atrial fibrillation ([place]) 06/17/2009  . OSA (obstructive sleep apnea) 06/17/2009    Past Surgical History:  Procedure Laterality Date  . ABDOMINAL HYSTERECTOMY    . ATRIAL ABLATION SURGERY     s/p ablation for afib 07/29/09  . CYSTOSCOPY N/A 06/23/2015   Procedure: CYSTOSCOPY;  Surgeon: Alexis Frock, MD;  Location: [facility];  Service: Urology;  Laterality: N/A;  . EP IMPLANTABLE DEVICE N/A 02/17/2015   MDT Advisa MRI PPM implanted by Dr Lovena Le for sick sinus and syncope  . EP IMPLANTABLE DEVICE N/A 02/17/2015   Procedure: Loop Recorder Removal;  Surgeon: Evans Lance, MD;  Location: [facility] [facility];  Service: Cardiovascular;  Laterality: N/A;  . ESOPHAGOGASTRODUODENOSCOPY  01/24/2012   Procedure: ESOPHAGOGASTRODUODENOSCOPY (EGD);  Surgeon: Jeryl Columbia, MD;  Location: [facility] ENDOSCOPY;  Service: Endoscopy;  Laterality: N/A;  . KNEE ARTHROSCOPY     BILATERAL  . LOOP RECORDER IMPLANT N/A 03/12/2014   Procedure: LOOP RECORDER IMPLANT;  Surgeon: Thompson Grayer, MD;  Location[facility] C[facility] LAB;  Service: Cardiovascular;  Laterality: N/A;  . LYSIS OF ADHESION N/A 06/23/2015   Procedure: LYSIS OF LABIAL ADHESION;  Surgeon: Alexis Frock, MD;  Location: [facility];  Service: Urology;  Laterality: N/A;    OB History    No data available        Home Medications    Prior to Admission medications   Medication Sig Start Date End Date Taking? Authorizing Provider  warfarin (COUMADIN) 3 MG tablet Take 3 mg by mouth daily. Takes an additonal 1 mg on Wednesday and Sunday. Entered in computer.   Yes Historical Provider, MD  Cholecalciferol (VITAMIN D PO) Take 2 capsules by mouth every morning.     Historical Provider, MD  Coenzyme Q10 (COQ10 PO) Take 1 capsule by mouth every morning.     Historical Provider, MD  estradiol (ESTRACE VAGINAL) 0.1 MG/GM vaginal cream Apply pea-sized amount to vagina / urethra 2-3 x weekly to prevent labial adhesions 06/23/15   Alexis Frock, MD  ezetimibe (ZETIA) 10 MG tablet Take 10 mg by mouth daily.     Historical Provider, MD  furosemide (LASIX) 40 MG tablet Take 1 tablet (40 mg total) by mouth 2 (two) times daily. 10/21/14   Jettie Booze, MD  KRILL OIL OMEGA-3 PO Take 1 capsule by mouth twice daily.    Historical Provider, MD  Magnesium 100 MG CAPS Take 1 capsule by mouth 2 (two) times daily.     Historical Provider, MD  metoprolol tartrate (LOPRESSOR) 25 MG tablet TAKE 1 TABLET TWICE DAILY.  MAY TAKE ONE EXTRA 25MG  TABLET DAILY IF YOU FEEL YOUR HEART IS OUT OF RHYTHM. 12/14/15   Jettie Booze, MD  potassium chloride (K-DUR) 10 MEQ tablet Take 10 mEq by mouth every morning.     Historical Provider, MD  rosuvastatin (CRESTOR) 20 MG tablet Take 1 tablet (20 mg total) by mouth every morning. Patient not taking: Reported on 04/04/2016 11/25/15   Jettie Booze, MD  traMADol (ULTRAM) 50 MG tablet Take 1-2 tablets (50-100 mg total) by mouth every 6 (six) hours as needed for moderate pain. Post-operatively 06/23/15   Alexis Frock, MD  warfarin (COUMADIN) 1 MG tablet Take 1 mg by mouth 4 (four) times a week. Takes on Wednesday and Sunday only for a total dose of 4mg  dose.    Historical Provider, MD    Family History Family History  Problem Relation Age of Onset  . Breast cancer Mother   .  Brain cancer Father   . Breast cancer    . Brain cancer    . Stroke Maternal Aunt   . Hypertension Sister   . Heart attack Neg Hx     Social History Social History  Substance Use Topics  . Smoking status: Never Smoker  . Smokeless tobacco: Never Used  . Alcohol use No     Allergies   Ace inhibitors; Albuterol; Albuterol sulfate; and Sulfa antibiotics   Review of Systems Review of Systems  Constitutional: Negative for fever.  Respiratory: Positive for chest tightness and shortness of breath.   Cardiovascular: Positive for palpitations.  Gastrointestinal: Negative for diarrhea, nausea and vomiting.  Neurological: Negative for light-headedness.  All other systems reviewed and are negative.  Physical Exam Updated Vital Signs BP 127/82   Pulse 83   Temp 98.7 F (37.1 C) (Oral)   Resp 14   Ht 5\' 1"  (1.549 m) Comment: Simultaneous filing. User may not have seen previous data.  Wt 206 lb (93.4 kg) Comment: Simultaneous filing. User may not have seen previous data.  SpO2 94%   BMI 38.92 kg/m  Vitals reviewed Physical Exam Physical Examination: General appearance - alert, well appearing, and in no distress Mental status - alert, oriented to person, place, and time Eyes - no conjunctival injection, no scleral icterus Chest - clear to auscultation, no wheezes, rales or rhonchi, symmetric air entry Heart - fast rate, irrregular rhythm, normal S1, S2, no murmurs, rubs, clicks or gallops Abdomen - soft, nontender, nondistended, no masses or organomegaly Neurological - alert, oriented, normal speech Extremities - peripheral pulses normal, no pedal edema, no clubbing or cyanosis Skin - normal coloration and turgor, no rashes  ED Treatments / Results  DIAGNOSTIC STUDIES:  Oxygen Saturation is 98% on Ra, normal by my interpretation.    COORDINATION OF CARE:  6:31 PM Discussed treatment plan with pt at bedside which includes cardizem and consultation with cardiology and  pt agreed to plan.  Labs (all labs ordered are listed, but only abnormal results are displayed) Labs Reviewed  CBC - Abnormal; Notable for the following:       Result Value   RDW 15.8 (*)    All other components within normal limits  BASIC METABOLIC PANEL - Abnormal; Notable for the following:    Chloride 99 (*)    Glucose, Bld 156 (*)    BUN 26 (*)    Creatinine, Ser 1.03 (*)    GFR calc non Af Amer 49 (*)    GFR calc Af Amer 57 (*)    All other components within normal limits  PROTIME-INR - Abnormal; Notable for the following:    Prothrombin Time 22.4 (*)    All other components within normal limits  TROPONIN I    EKG  EKG Interpretation  Date/Time:  Tuesday April 04 2016 18:12:25 EST Ventricular Rate:  144 PR Interval:    QRS Duration: 82 QT Interval:  296 QTC Calculation: 458 R Axis:   92 Text Interpretation:  Atrial fibrillation with rapid ventricular response Rightward axis Possible Anterior infarct , age undetermined Marked ST abnormality, possible inferior subendocardial injury Abnormal ECG Since previous tracing afib with RVR is replacing sinus rhythm Confirmed by Canary Brim  MD, MARTHA [phone]) on 04/04/2016 6:20:34 PM       Radiology Dg Chest 2 View  Result Date: 04/04/2016 CLINICAL DATA:  Shortness of breath. EXAM: CHEST  2 VIEW COMPARISON:  Radiographs of February 21, 2016. FINDINGS: Stable cardiomediastinal silhouette. No pneumothorax or pleural effusion is noted. Left-sided pacemaker is unchanged in position. No acute pulmonary disease is noted. Multilevel degenerative disc disease is noted in the thoracic spine. IMPRESSION: No active cardiopulmonary disease. Electronically Signed   By: Marijo Conception, M.D.   On: 04/04/2016 18:54    Procedures Procedures (including critical care time)  Medications Ordered in ED Medications  diltiazem (CARDIZEM) 100 mg in dextrose 5% 122mL (1 mg/mL) infusion (10 mg/hr Intravenous Transfusing/Transfer 04/04/16 2100)   metoprolol (LOPRESSOR) tablet 50 mg (not administered)  diltiazem (CARDIZEM) injection 10 mg (10 mg Intravenous Given 04/04/16 1909)  diltiazem (CARDIZEM) 25 MG/5ML injection (100 mg  Given 04/04/16 1909)  metoprolol (LOPRESSOR) tablet 25 mg (25 mg Oral Given 04/04/16 2015)  CRITICAL CARE Performed by: Threasa Beards Total critical care time: 45 minutes Critical care time was exclusive of separately billable procedures and treating other patients. Critical care was necessary to treat or prevent imminent or life-threatening deterioration. Critical care was time spent personally by me on the following activities: development of treatment plan with patient and/or surrogate as well as nursing, discussions with consultants, evaluation of patient's response to treatment, examination of patient, obtaining history from patient or surrogate, ordering and performing treatments and interventions, ordering and review of laboratory studies, ordering and review of radiographic studies, pulse oximetry and re-evaluation of patient's condition.  Initial Impression / Assessment and Plan / ED Course  I have reviewed the triage vital signs and the nursing notes.  Pertinent labs & imaging results that were available during my care of the patient were reviewed by me and considered in my medical decision making (see chart for details).    7:42 PM HR is down to 115 on cardizem drip.  Consult placed to cardiology.    8:09 PM d/w cardiology fellow.  He states to give another 25mg  metoprolol now.  He requests admission to medicine.  Will page them now-  Will try to wean cardizem drip if able.    8:33 PM d/w dr danford, triad- stepdown bed requested.  Nurse has just increased dilt drip due to HR sustained in 110-120 range-- pt already has stepdown bed assigned at this time    Pt with hx of afib on coumadin presenting in afib with RVR.  INR is just under therapeutic at 1.94.  HR in 150s on arrival- pt started on  cardizem drip- see notes as above.    Final Clinical Impressions(s) / ED Diagnoses   Final diagnoses:  Atrial fibrillation with rapid ventricular response [facility])    New Prescriptions Current Discharge Medication List    I personally performed the services described in this documentation, which was scribed in my presence. The recorded information has been reviewed and is accurate.      [name], MD 04/04/16 [phone]

## 2016-04-04 NOTE — Progress Notes (Signed)
83 yo F with COPD, pAF on warfarin, sss with pacer, HTN and hx stroke presents with tachycardia, SOB found to be in afib with RVR rates 150s.  (Was wheezing this week, saw PCP, who started prednisone.)  BP 126/76 Comment: cardziem drip started  Pulse 119 Comment: cardziem drip started  Temp 98.3 F (36.8 C) (Oral)   Resp 16 Comment: cardziem drip started  Ht 5\' 2"  (1.575 m)   Wt 91.6 kg (202 lb)   SpO2 92% Comment: cardziem drip started  BMI 36.95 kg/m   Na 139, K 3.7, Cr 1.03 (baseline 1.0), WBC 8.1, Hgb 13.2  Normal CXR, troponin negative. INR 1.94  D/w cards who rec'd [facility] admit.  She took an extra metop 25 tonight at 5pm at home, got IV dilt bolus in ER, started on dilt drip and got another metop 25 mg in ER orally.    To SDU OBS status.  ED will attempt to wean dilt while waiting for bed, if so, we will update bed request to telemetry.  Plan for increasing metoprolol from 25 BID to 50 mg BID and probably discharge tomorrow.

## 2016-04-04 NOTE — ED Notes (Signed)
Report called to RN at [facility] on 3w

## 2016-04-05 ENCOUNTER — Encounter (HOSPITAL_COMMUNITY): Payer: Self-pay | Admitting: Cardiology

## 2016-04-05 DIAGNOSIS — J441 Chronic obstructive pulmonary disease with (acute) exacerbation: Secondary | ICD-10-CM

## 2016-04-05 DIAGNOSIS — G4733 Obstructive sleep apnea (adult) (pediatric): Secondary | ICD-10-CM

## 2016-04-05 DIAGNOSIS — I10 Essential (primary) hypertension: Secondary | ICD-10-CM

## 2016-04-05 DIAGNOSIS — Z7901 Long term (current) use of anticoagulants: Secondary | ICD-10-CM | POA: Diagnosis not present

## 2016-04-05 DIAGNOSIS — I480 Paroxysmal atrial fibrillation: Secondary | ICD-10-CM | POA: Diagnosis not present

## 2016-04-05 DIAGNOSIS — N183 Chronic kidney disease, stage 3 (moderate): Secondary | ICD-10-CM | POA: Diagnosis not present

## 2016-04-05 DIAGNOSIS — J449 Chronic obstructive pulmonary disease, unspecified: Secondary | ICD-10-CM | POA: Diagnosis not present

## 2016-04-05 DIAGNOSIS — I129 Hypertensive chronic kidney disease with stage 1 through stage 4 chronic kidney disease, or unspecified chronic kidney disease: Secondary | ICD-10-CM | POA: Diagnosis not present

## 2016-04-05 DIAGNOSIS — I4891 Unspecified atrial fibrillation: Secondary | ICD-10-CM | POA: Diagnosis not present

## 2016-04-05 DIAGNOSIS — Z5181 Encounter for therapeutic drug level monitoring: Secondary | ICD-10-CM | POA: Diagnosis not present

## 2016-04-05 DIAGNOSIS — R791 Abnormal coagulation profile: Secondary | ICD-10-CM

## 2016-04-05 DIAGNOSIS — E876 Hypokalemia: Secondary | ICD-10-CM | POA: Diagnosis not present

## 2016-04-05 DIAGNOSIS — Z79899 Other long term (current) drug therapy: Secondary | ICD-10-CM | POA: Diagnosis not present

## 2016-04-05 LAB — PROTIME-INR
INR: 1.99
Prothrombin Time: 22.9 seconds — ABNORMAL HIGH (ref 11.4–15.2)

## 2016-04-05 LAB — BASIC METABOLIC PANEL
ANION GAP: 8 (ref 5–15)
BUN: 21 mg/dL — ABNORMAL HIGH (ref 6–20)
CALCIUM: 8.6 mg/dL — AB (ref 8.9–10.3)
CO2: 28 mmol/L (ref 22–32)
Chloride: 103 mmol/L (ref 101–111)
Creatinine, Ser: 0.97 mg/dL (ref 0.44–1.00)
GFR, EST NON AFRICAN AMERICAN: 53 mL/min — AB (ref >60)
Glucose, Bld: 119 mg/dL — ABNORMAL HIGH (ref 65–99)
Potassium: 3.1 mmol/L — ABNORMAL LOW (ref 3.5–5.1)
SODIUM: 139 mmol/L (ref 135–145)

## 2016-04-05 LAB — CBC
HEMATOCRIT: 37.4 % (ref 36.0–46.0)
Hemoglobin: 12 g/dL (ref 12.0–15.0)
MCH: 29.9 pg (ref 26.0–34.0)
MCHC: 32.1 g/dL (ref 30.0–36.0)
MCV: 93.3 fL (ref 78.0–100.0)
Platelets: 209 10*3/uL (ref 150–400)
RBC: 4.01 MIL/uL (ref 3.87–5.11)
RDW: 15.9 % — AB (ref 11.5–15.5)
WBC: 8.6 10*3/uL (ref 4.0–10.5)

## 2016-04-05 LAB — MAGNESIUM: MAGNESIUM: 2.4 mg/dL (ref 1.7–2.4)

## 2016-04-05 LAB — TROPONIN I
TROPONIN I: 0.04 ng/mL — AB (ref <0.03)
Troponin I: 0.03 ng/mL (ref <0.03)

## 2016-04-05 LAB — MRSA PCR SCREENING: MRSA BY PCR: NEGATIVE

## 2016-04-05 LAB — TSH: TSH: 2.254 u[IU]/mL (ref 0.350–4.500)

## 2016-04-05 MED ORDER — GUAIFENESIN ER 600 MG PO TB12: 600.0000 mg | ORAL_TABLET | Freq: Two times a day (BID) | ORAL | 0 refills | Status: DC

## 2016-04-05 MED ORDER — AZITHROMYCIN 250 MG PO TABS
250.0000 mg | ORAL_TABLET | Freq: Every day | ORAL | Status: DC
  Administered 2016-04-05: 250 mg via ORAL
  Filled 2016-04-05: qty 1

## 2016-04-05 MED ORDER — ONDANSETRON HCL 4 MG/2ML IJ SOLN: 4.0000 mg | Freq: Four times a day (QID) | INTRAMUSCULAR | Status: DC | PRN

## 2016-04-05 MED ORDER — METOPROLOL TARTRATE 50 MG PO TABS: 50.0000 mg | ORAL_TABLET | Freq: Two times a day (BID) | ORAL | 0 refills | Status: DC

## 2016-04-05 MED ORDER — WARFARIN - PHARMACIST DOSING INPATIENT: Freq: Every day | Status: DC

## 2016-04-05 MED ORDER — WARFARIN SODIUM 5 MG PO TABS
5.0000 mg | ORAL_TABLET | ORAL | Status: AC
Start: 2016-04-05 — End: 2016-04-05
  Administered 2016-04-05: 5 mg via ORAL
  Filled 2016-04-05: qty 1

## 2016-04-05 MED ORDER — ACETAMINOPHEN-CODEINE #3 300-30 MG PO TABS: 1.0000 | ORAL_TABLET | Freq: Four times a day (QID) | ORAL | Status: DC | PRN

## 2016-04-05 MED ORDER — ACETAMINOPHEN 325 MG PO TABS: 650.0000 mg | ORAL_TABLET | Freq: Four times a day (QID) | ORAL | Status: DC | PRN

## 2016-04-05 MED ORDER — GUAIFENESIN ER 600 MG PO TB12
600.0000 mg | ORAL_TABLET | Freq: Two times a day (BID) | ORAL | Status: DC
  Administered 2016-04-05 (×2): 600 mg via ORAL
  Filled 2016-04-05 (×2): qty 1

## 2016-04-05 MED ORDER — ONDANSETRON HCL 4 MG PO TABS: 4.0000 mg | ORAL_TABLET | Freq: Four times a day (QID) | ORAL | Status: DC | PRN

## 2016-04-05 MED ORDER — POTASSIUM CHLORIDE CRYS ER 20 MEQ PO TBCR
40.0000 meq | EXTENDED_RELEASE_TABLET | ORAL | Status: AC
  Administered 2016-04-05 (×2): 40 meq via ORAL
  Filled 2016-04-05 (×2): qty 2

## 2016-04-05 MED ORDER — ACETAMINOPHEN 650 MG RE SUPP: 650.0000 mg | Freq: Four times a day (QID) | RECTAL | Status: DC | PRN

## 2016-04-05 MED ORDER — PREDNISONE 20 MG PO TABS
30.0000 mg | ORAL_TABLET | Freq: Every day | ORAL | Status: DC
  Administered 2016-04-05: 30 mg via ORAL
  Filled 2016-04-05: qty 1

## 2016-04-05 MED ORDER — WARFARIN SODIUM 4 MG PO TABS: 4.0000 mg | ORAL_TABLET | Freq: Once | ORAL | Status: DC

## 2016-04-05 NOTE — Care Management Obs Status (Signed)
[place] NOTIFICATION   Patient Details  Name: Lynn Dunn MRN: [id number] Date of Birth: [birth year]   Medicare Observation Status Notification Given:  Yes    Bethena Roys, RN 04/05/2016, 4:38 PM

## 2016-04-05 NOTE — Consult Note (Signed)
Cardiology Consult    Patient ID: Lynn Dunn MRN: [id number], DOB/AGE: May 20, 1930   Admit date: 04/04/2016 Date of Consult: 04/05/2016  Primary Physician: [name], MD Reason for Consult: Atrial fibrillation with RVR Primary Cardiologist: Dr. Irish Lack Requesting Provider: Dr Tamala Julian   History of Present Illness     Lynn Dunn is an 83 y.o. with past medical history significant for PAF and CVA on coumadin, AF ablation in 2011, sick sinus syndrome (pauses and syncope) and pacemaker (Medtronic Advisa MRI 02/2015), OSA using CPAP, HTN, anemia, Moderate right carotid disease (12/2015), GERD, heart murmur and obesity who presented to the [facility] ED for evaluation of palpitations with hypertension and tachycardia not responding to extra dose of beta blocker. We have been asked to consult for management of atrial fibrillation with rapid ventricular response.   Patient has had recent cough, congestion and wheezing. She saw her PCP on Monday and was prescribed Z-Pak, cough medicine and prednisone taper. About 5 pm yesterday she was sitting on her couch when she noted an elevated heart rate and her BP began to creep up. She eventually felt like she was running a race with shortness of breath, no chest pain. She took an extra metoprolol with minimal improvement. She then proceeded to the ED.   Upon presentation pt was is atrial fib with rapid ventricular response rate in the 140's. She was given diltiazem 10 mg IV and a Diltiazem drip was initiated. Her heart rate come down during the night and she converted to NSR at 0937 this morning and is maintaining SR in the 60's. Drip was stopped at 0952. Her home metoprolol dose was increased from 25 mg bid to 50 mg bid. Feels much better today.  Troponins were 0.03, 0.04, 0.03. TSH 2.254. INR 1.94. Chest xray - no active cardiopulmonary disease.   EKG on presentation showed afib at 144 bpm, ST depression with upsloping T in inferior and  lateral leads  Last device check was on 03/15/16 and showed sinus rhythm with 0.7% AT/AF, normal device function, 10 years of battery life remaining.  Last office visit was with Dr. Irish Lack on 11/25/2015. At that time she was maintaining sinus rhythm and had reported occasional episodes of afib that converted within 30 minutes spontaneously or with extra dose of metoprolol.  Pt had a normal stress test in 01/2014. Echo in 02/2014 showed normal LV EF 60-65%, mild LVH, moderately dilated LA  Past Medical History   Past Medical History:  Diagnosis Date  . Anemia   . Anginal pain ([place]) 2011   had to take nitro   . Arthritis   . COPD (chronic obstructive pulmonary disease) ([place])   . Dysuria   . GERD (gastroesophageal reflux disease)   . Heart murmur   . Hematuria   . HTN (hypertension)   . Hx of echocardiogram    Echo (12/15):  Mild LVH, EF 60-65%, mild MR, mod LAE (LA 49 mm), PASP 43 mmHg  . Obesity   . OSA (obstructive sleep apnea)    cpap at home  . PAF (paroxysmal atrial fibrillation) ([place])    s/p ablation 07/29/09  . Sick sinus syndrome ([facility])    pauses with syncope  . Stroke ([place]) 6/09   NO RESIDUAL PROBLEMS    Past Surgical History:  Procedure Laterality Date  . ABDOMINAL HYSTERECTOMY    . ATRIAL ABLATION SURGERY     s/p ablation for afib 07/29/09  . CYSTOSCOPY N/A 06/23/2015   Procedure:  CYSTOSCOPY;  Surgeon: Alexis Frock, MD;  Location: [facility];  Service: Urology;  Laterality: N/A;  . EP IMPLANTABLE DEVICE N/A 02/17/2015   MDT Advisa MRI PPM implanted by Dr Lovena Le for sick sinus and syncope  . EP IMPLANTABLE DEVICE N/A 02/17/2015   Procedure: Loop Recorder Removal;  Surgeon: Evans Lance, MD;  Location: [facility];  Service: Cardiovascular;  Laterality: N/A;  . ESOPHAGOGASTRODUODENOSCOPY  01/24/2012   Procedure: ESOPHAGOGASTRODUODENOSCOPY (EGD);  Surgeon: Jeryl Columbia, MD;  Location[facility] ENDOSCOPY;  Service: Endoscopy;  Laterality: N/A;  . KNEE ARTHROSCOPY      BILATERAL  . LOOP RECORDER IMPLANT N/A 03/12/2014   Procedure: LOOP RECORDER IMPLANT;  Surgeon: Thompson Grayer, MD;  Location[facility] LAB;  Service: Cardiovascular;  Laterality: N/A;  . LYSIS OF ADHESION N/A 06/23/2015   Procedure: LYSIS OF LABIAL ADHESION;  Surgeon: Alexis Frock, MD;  Location: [facility];  Service: Urology;  Laterality: N/A;     Allergies  Allergies  Allergen Reactions  . Ace Inhibitors Other (See Comments)    cough  . Albuterol Palpitations    Atrial fibrillation  . Albuterol Sulfate Palpitations    A. Fib  . Sulfa Antibiotics Rash    Inpatient Medications    . guaiFENesin  600 mg Oral BID  . metoprolol tartrate  50 mg Oral BID  . potassium chloride  40 mEq Oral Q4H  . predniSONE  30 mg Oral Q breakfast  . warfarin  4 mg Oral ONCE-1800  . Warfarin - Pharmacist Dosing Inpatient   Does not apply q1800    Family History    Family History  Problem Relation Age of Onset  . Breast cancer Mother   . Brain cancer Father   . Breast cancer    . Brain cancer    . Stroke Maternal Aunt   . Hypertension Sister   . Heart attack Neg Hx     Social History    Social History   Social History  . Marital status: Widowed    Spouse name: N/A  . Number of children: N/A  . Years of education: N/A   Occupational History  . Not on file.   Social History Main Topics  . Smoking status: Never Smoker  . Smokeless tobacco: Never Used  . Alcohol use No  . Drug use: No  . Sexual activity: Not on file   Other Topics Concern  . Not on file   Social History Narrative  . No narrative on file     Review of Systems   General:  No chills, fever, night sweats or weight changes.  Cardiovascular:  No chest pain, dyspnea on exertion, edema, orthopnea, palpitations, paroxysmal nocturnal dyspnea. Dermatological: No rash, lesions/masses Respiratory: No dyspnea, occ cough Urologic: No hematuria, dysuria Abdominal:   No nausea, vomiting, diarrhea, bright red blood per rectum,  melena, or hematemesis Neurologic:  No visual changes, wkns, changes in mental status. All other systems reviewed and are otherwise negative except as noted above.  Physical Exam    Blood pressure 130/61, pulse 71, temperature 97.3 F (36.3 C), temperature source Oral, resp. rate 20, height 5\' 1"  (1.549 m), weight 206 lb (93.4 kg), SpO2 98 %.  General: Pleasant, NAD Psych: Normal affect. Neuro: Alert and oriented X 3. Moves all extremities spontaneously. HEENT: Normal  Neck: Supple without bruits or JVD. Lungs:  Resp regular and unlabored, CTA. Heart: RRR no s3, s4, or murmurs. Abdomen: Soft, non-tender, non-distended, BS + x 4.  Extremities: No clubbing or cyanosis. DP/PT/Radials 2+ and equal bilaterally. She has puffiness of her lower extremities, no pitting.  Labs    Troponin (Point of Care Test) No results for input(s): TROPIPOC in the last 72 hours.  Recent Labs  04/04/16 1830 04/05/16 0042 04/05/16 0557  TROPONINI <0.03 0.04* 0.03*   Lab Results  Component Value Date   WBC 8.6 04/05/2016   HGB 12.0 04/05/2016   HCT 37.4 04/05/2016   MCV 93.3 04/05/2016   PLT 209 04/05/2016    Recent Labs Lab 04/05/16 0557  NA 139  K 3.1*  CL 103  CO2 28  BUN 21*  CREATININE 0.97  CALCIUM 8.6*  GLUCOSE 119*   Lab Results  Component Value Date   CHOL 170 08/31/2014   HDL 51.80 08/31/2014   LDLCALC 86 08/31/2014   TRIG 162.0 (H) 08/31/2014   Lab Results  Component Value Date   DDIMER  04/12/2009    <0.22        AT THE INHOUSE ESTABLISHED CUTOFF VALUE OF 0.48 ug/mL FEU, THIS ASSAY HAS BEEN DOCUMENTED IN THE LITERATURE TO HAVE A SENSITIVITY AND NEGATIVE PREDICTIVE VALUE OF AT LEAST 98 TO 99%.  THE TEST RESULT SHOULD BE CORRELATED WITH AN ASSESSMENT OF THE CLINICAL PROBABILITY OF DVT / VTE.     Radiology Studies    Dg Chest 2 View  Result Date: 04/04/2016 CLINICAL DATA:  Shortness of breath. EXAM: CHEST  2 VIEW COMPARISON:  Radiographs of February 21, 2016.  FINDINGS: Stable cardiomediastinal silhouette. No pneumothorax or pleural effusion is noted. Left-sided pacemaker is unchanged in position. No acute pulmonary disease is noted. Multilevel degenerative disc disease is noted in the thoracic spine. IMPRESSION: No active cardiopulmonary disease. Electronically Signed   By: Marijo Conception, M.D.   On: 04/04/2016 18:54    EKG & Cardiac Imaging    EKG:  04/04/2016 at 1812: afib at 144 bpm, ST depression with upsloping T in inferior and lateral leads 04/05/2016 at 1021: Sinus rhythm, 69 bpm with poor r wave progression   Echocardiogram:  Last echo 02/23/2014  Study Conclusions - Left ventricle: The cavity size was normal. Wall thickness was increased in a pattern of mild LVH. Systolic function was normal. The estimated ejection fraction was in the range of 60% to 65%. Doppler parameters are consistent with elevated ventricular end-diastolic filling pressure. - Mitral valve: There was mild regurgitation. - Left atrium: The atrium was moderately dilated. - Atrial septum: No defect or patent foramen ovale was identified. - Pulmonary arteries: PA peak pressure: 43 mm Hg (S).  Assessment & Plan    PAF -Pt admitted with atrial fibrillation with rapid ventricular response rates in the 140's. Did not respond to extra dose of metoprolol as per pt's usual. -Recent URI with cough, congestion and wheezing treated with Z-Pak, cough medicine and prednisone taper -Rate improved with diltiazem drip and increase in metoprolol. Converted to NSR this morning and maintaining with rates in the 60's. BP stable.  -Diltiazem off. Metoprolol 25 mg bid PTA increased to 50 mg bid to maintain sinus rhythm -Initial EKG during afib with RVR showed ST/T changes, not present on EKG in sinus rhythm -This patients CHA2DS2-VASc Score is 6 (HTN, Age (2), stroke (2), female). She is anticoagulated with warfarin prior to admission. INR on admission 1.94. Pharmacy dosing.  Continue at discharge.  -Last echo in 2015 showed normal LV function and dilated LA which can make maintenance of sinus rhythm more difficult, but pt converted  relatively easily.  -No chest pain. Dyspnea during elevated heart rates now greatly improved.  -Can probably discharge soon with follow up to assess rhythm and tolerance of increased beta blocker. Has pacemaker so will not have bradycardia, but pt is afraid BP will be too low. So far BP is fine.    Suspected COPD exacerbation -- Continue azithromycin, acetaminophen-codeine prn cough, and prednisone taper 30 mg by mouth 2 days, mucinex--per IM  HTN -Continue metoprolol with increased dose to control rhythm. Monitor BP. So far stable.  History of SSS -Medtronic pacemaker: Last device check was on 03/15/16 and showed sinus rhythm with 0.7% AT/AF, normal device function, 10 years of battery life remaining.  OSA -Use CPAP  CKD stage III -Stable with creatinine 0.97   Signed, Daune Perch, NP-C 04/05/2016, 1:15 PM Pager: [phone]  History and all data above reviewed.  Patient examined.  I agree with the findings as above.  The patient exam reveals COR:RRR  ,  Lungs: Clear  ,  Abd: Positive bowel sounds, no rebound no guarding, Ext No edema  .  All available labs, radiology testing, previous records reviewed. Agree with documented assessment and plan. Atrial fib:  Back in NSR.  OK to discharge on increased beta blocker.  I discussed this with the patient.  We will arrange follow up.  No further in patient work up indicated.   Jeneen Rinks Sylina Henion  4:19 PM  04/05/2016

## 2016-04-05 NOTE — Progress Notes (Signed)
ANTICOAGULATION CONSULT NOTE - Initial Consult  Pharmacy Consult for Coumadin Indication: atrial fibrillation  Allergies  Allergen Reactions  . Ace Inhibitors Other (See Comments)    cough  . Albuterol Palpitations    Atrial fibrillation  . Albuterol Sulfate Palpitations    A. Fib  . Sulfa Antibiotics Rash    Patient Measurements: Height: 5\' 1"  (154.9 cm) (Simultaneous filing. User may not have seen previous data.) Weight: 206 lb (93.4 kg) (Simultaneous filing. User may not have seen previous data.) IBW/kg (Calculated) : 47.8  Vital Signs: Temp: 98.7 F (37.1 C) (01/30 2120) Temp Source: Oral (01/30 2120) BP: 127/82 (01/30 2120) Pulse Rate: 83 (01/30 2120)  Labs:  Recent Labs  04/04/16 1830  HGB 13.2  HCT 41.2  PLT 252  LABPROT 22.4*  INR 1.94  CREATININE 1.03*  TROPONINI <0.03    Estimated Creatinine Clearance: 43.1 mL/min (by C-G formula based on SCr of 1.03 mg/dL (H)).   Medical History: Past Medical History:  Diagnosis Date  . Anemia   . Anginal pain ([place]) 2011   had to take nitro   . Arthritis   . COPD (chronic obstructive pulmonary disease) ([place])   . Dysuria   . GERD (gastroesophageal reflux disease)   . Heart murmur   . Hematuria   . HTN (hypertension)   . Hx of echocardiogram    Echo (12/15):  Mild LVH, EF 60-65%, mild MR, mod LAE (LA 49 mm), PASP 43 mmHg  . Obesity   . OSA (obstructive sleep apnea)    cpap at home  . PAF (paroxysmal atrial fibrillation) ([place])    s/p ablation 07/29/09  . Sick sinus syndrome ([facility])    pauses with syncope  . Stroke ([place]) 6/09   NO RESIDUAL PROBLEMS    Medications:  Prescriptions Prior to Admission  Medication Sig Dispense Refill Last Dose  . warfarin (COUMADIN) 3 MG tablet Take 3 mg by mouth daily. Takes an additonal 1 mg on Wednesday and Sunday. Entered in computer.   04/03/2016 at Unknown time  . Cholecalciferol (VITAMIN D PO) Take 2 capsules by mouth every morning.    Taking  . Coenzyme Q10 (COQ10 PO)  Take 1 capsule by mouth every morning.    Taking  . estradiol (ESTRACE VAGINAL) 0.1 MG/GM vaginal cream Apply pea-sized amount to vagina / urethra 2-3 x weekly to prevent labial adhesions 42.5 g 12 Taking  . ezetimibe (ZETIA) 10 MG tablet Take 10 mg by mouth daily.    Taking  . furosemide (LASIX) 40 MG tablet Take 1 tablet (40 mg total) by mouth 2 (two) times daily. 180 tablet 3 Taking  . KRILL OIL OMEGA-3 PO Take 1 capsule by mouth twice daily.   Taking  . Magnesium 100 MG CAPS Take 1 capsule by mouth 2 (two) times daily.    Taking  . metoprolol tartrate (LOPRESSOR) 25 MG tablet TAKE 1 TABLET TWICE DAILY.  MAY TAKE ONE EXTRA 25MG  TABLET DAILY IF YOU FEEL YOUR HEART IS OUT OF RHYTHM. 135 tablet 3   . potassium chloride (K-DUR) 10 MEQ tablet Take 10 mEq by mouth every morning.    Taking  . rosuvastatin (CRESTOR) 20 MG tablet Take 1 tablet (20 mg total) by mouth every morning. (Patient not taking: Reported on 04/04/2016) 90 tablet 3 Not Taking at Unknown time  . traMADol (ULTRAM) 50 MG tablet Take 1-2 tablets (50-100 mg total) by mouth every 6 (six) hours as needed for moderate pain. Post-operatively 30 tablet 0 Taking  .  warfarin (COUMADIN) 1 MG tablet Take 1 mg by mouth 4 (four) times a week. Takes on Wednesday and Sunday only for a total dose of 4mg  dose.   Taking   Scheduled:  . metoprolol tartrate  50 mg Oral BID   Infusions:  . dilTIAZem HCl-Dextrose 10 mg/hr (04/04/16 2017)    Assessment: 83yo female c/o irregular heartbeat w/ palpitations, took extra dose of metoprolol which has resolved sx in past; known h/o Afib; was started on ABX and steroid for COPD exacerbation yesterday; found to be in Afib w/ RVR, to continue Coumadin during admission; current INR just below goal w/ last dose of Coumadin taken 1/29.  Goal of Therapy:  INR 2-3   Plan:  Will give boosted dose of Coumadin 5mg  po x1 now and monitor INR for dose adjustments.  Wynona Neat, PharmD, BCPS  04/05/2016,12:28 AM

## 2016-04-05 NOTE — H&P (Addendum)
History and Physical    Lynn Dunn HWE:[id number] DOB: 1930-11-18 DOA: 04/04/2016  Referring MD/NP/PA: Dr. Loleta Books PCP: [name], MD  Patient coming from: [facility]  Chief Complaint: Palpitations  HPI: JESSCIA Dunn is a 83 y.o. female with medical history significant of HTN, atrial fibrillation on Coumadin, COPD, SSS s/p PM, and OSA on CPAP; who presents with complaints of palpitations. Symptoms started around 5 PM while she was sitting on the couch watching television. Initially she reports feeling her blood pressure rising and subsequently her pulse. She checked her vitals and notes her systolic blood pressure was elevated up to 175, and her pulse was in the 140s. At that time she took Her dose of her metoprolol 25 mg. She waited and rechecked her vitals 3 times noting only slight improvement. Reports associated symptoms of feeling chest congestion, shortness of breath, and achy centralized chest pain. She was seen by her PCP 2 days ago for complaints of cough and congestion. At that time she was also to be wheezing prescribed Z-Pak, cough medicine, and prednisone taper. Patient has taken 2 days worth of her medications.  ED Course: Admission to the emergency department patient was seen to be in A. fib with RVR with heart rates up to 150s. Patient was started on Cardizem drip. Lab work revealed troponin negative,  INR 1.94, and all other labs were relatively unremarkable. Chest x-ray showing no acute abnormality. Cardiology was consulted and recommended admitting to the hospitalist group. Received an IV dilt bolus in ER, started on dilt drip, and got another dose of metoprolol 25 mg in ER orally.  Admitted to SDU with OBS status.  ED will attempt to wean dilt while waiting for bed, if so, we will update bed request to telemetry.  Plan for increasing metoprolol from 25 BID to 50 mg BID and probably discharge tomorrow.  Review of Systems: As per HPI otherwise 10 point review of systems  negative.   Past Medical History:  Diagnosis Date  . Anemia   . Anginal pain ([place]) 2011   had to take nitro   . Arthritis   . COPD (chronic obstructive pulmonary disease) ([place])   . Dysuria   . GERD (gastroesophageal reflux disease)   . Heart murmur   . Hematuria   . HTN (hypertension)   . Hx of echocardiogram    Echo (12/15):  Mild LVH, EF 60-65%, mild MR, mod LAE (LA 49 mm), PASP 43 mmHg  . Obesity   . OSA (obstructive sleep apnea)    cpap at home  . PAF (paroxysmal atrial fibrillation) ([place])    s/p ablation 07/29/09  . Sick sinus syndrome (H[facility])    pauses with syncope  . Stroke ([place]) 6/09   NO RESIDUAL PROBLEMS    Past Surgical History:  Procedure Laterality Date  . ABDOMINAL HYSTERECTOMY    . ATRIAL ABLATION SURGERY     s/p ablation for afib 07/29/09  . CYSTOSCOPY N/A 06/23/2015   Procedure: CYSTOSCOPY;  Surgeon: Alexis Frock, MD;  Location: [facility]S;  Service: Urology;  Laterality: N/A;  . EP IMPLANTABLE DEVICE N/A 02/17/2015   MDT Advisa MRI PPM implanted by Dr Lovena Le for sick sinus and syncope  . EP IMPLANTABLE DEVICE N/A 02/17/2015   Procedure: Loop Recorder Removal;  Surgeon: Evans Lance, MD;  Location: [facility];  Service: Cardiovascular;  Laterality: N/A;  . ESOPHAGOGASTRODUODENOSCOPY  01/24/2012   Procedure: ESOPHAGOGASTRODUODENOSCOPY (EGD);  Surgeon: Jeryl Columbia, MD;  Location: [facility] ENDOSCOPY;  Service: Endoscopy;  Laterality: N/A;  . KNEE ARTHROSCOPY     BILATERAL  . LOOP RECORDER IMPLANT N/A 03/12/2014   Procedure: LOOP RECORDER IMPLANT;  Surgeon: Thompson Grayer, MD;  Location: [facility] CATH LAB;  Service: Cardiovascular;  Laterality: N/A;  . LYSIS OF ADHESION N/A 06/23/2015   Procedure: LYSIS OF LABIAL ADHESION;  Surgeon: Alexis Frock, MD;  Location: [facility];  Service: Urology;  Laterality: N/A;     reports that she has never smoked. She has never used smokeless tobacco. She reports that she does not drink alcohol or use drugs.  Allergies  Allergen  Reactions  . Ace Inhibitors Other (See Comments)    cough  . Albuterol Palpitations    Atrial fibrillation  . Albuterol Sulfate Palpitations    A. Fib  . Sulfa Antibiotics Rash    Family History  Problem Relation Age of Onset  . Breast cancer Mother   . Brain cancer Father   . Breast cancer    . Brain cancer    . Stroke Maternal Aunt   . Hypertension Sister   . Heart attack Neg Hx     Prior to Admission medications   Medication Sig Start Date End Date Taking? Authorizing Provider  warfarin (COUMADIN) 3 MG tablet Take 3 mg by mouth daily. Takes an additonal 1 mg on Wednesday and Sunday. Entered in computer.   Yes Historical Provider, MD  Cholecalciferol (VITAMIN D PO) Take 2 capsules by mouth every morning.     Historical Provider, MD  Coenzyme Q10 (COQ10 PO) Take 1 capsule by mouth every morning.     Historical Provider, MD  estradiol (ESTRACE VAGINAL) 0.1 MG/GM vaginal cream Apply pea-sized amount to vagina / urethra 2-3 x weekly to prevent labial adhesions 06/23/15   Alexis Frock, MD  ezetimibe (ZETIA) 10 MG tablet Take 10 mg by mouth daily.     Historical Provider, MD  furosemide (LASIX) 40 MG tablet Take 1 tablet (40 mg total) by mouth 2 (two) times daily. 10/21/14   Jettie Booze, MD  KRILL OIL OMEGA-3 PO Take 1 capsule by mouth twice daily.    Historical Provider, MD  Magnesium 100 MG CAPS Take 1 capsule by mouth 2 (two) times daily.     Historical Provider, MD  metoprolol tartrate (LOPRESSOR) 25 MG tablet TAKE 1 TABLET TWICE DAILY.  MAY TAKE ONE EXTRA 25MG  TABLET DAILY IF YOU FEEL YOUR HEART IS OUT OF RHYTHM. 12/14/15   Jettie Booze, MD  potassium chloride (K-DUR) 10 MEQ tablet Take 10 mEq by mouth every morning.     Historical Provider, MD  rosuvastatin (CRESTOR) 20 MG tablet Take 1 tablet (20 mg total) by mouth every morning. Patient not taking: Reported on 04/04/2016 11/25/15   Jettie Booze, MD  traMADol (ULTRAM) 50 MG tablet Take 1-2 tablets (50-100 mg  total) by mouth every 6 (six) hours as needed for moderate pain. Post-operatively 06/23/15   Alexis Frock, MD  warfarin (COUMADIN) 1 MG tablet Take 1 mg by mouth 4 (four) times a week. Takes on Wednesday and Sunday only for a total dose of 4mg  dose.    Historical Provider, MD    Physical Exam:    Constitutional: Early female in NAD, calm, comfortable Vitals:   04/04/16 2030 04/04/16 2059 04/04/16 2100 04/04/16 2120  BP: 138/73  137/78 127/82  Pulse: 64 65 82 83  Resp: 14 17 15 14   Temp:    98.7 F (37.1 C)  TempSrc:  Oral  SpO2: 96% 96% 90% 94%  Weight:    93.4 kg (206 lb)  Height:    5\' 1"  (1.549 m)   Eyes: PERRL, lids and conjunctivae normal ENMT: Mucous membranes are moist. Posterior pharynx clear of any exudate or lesions.Normal dentition.  Neck: normal, supple, no masses, no thyromegaly Respiratory: clear to auscultation bilaterally, no wheezing, no crackles. Normal respiratory effort. No accessory muscle use.  Cardiovascular: Irregular irregular, no murmurs / rubs / gallops. Trace lower extremity edema. 2+ pedal pulses. No carotid bruits.  Abdomen: no tenderness, no masses palpated. No hepatosplenomegaly. Bowel sounds positive.  Musculoskeletal: no clubbing / cyanosis. No joint deformity upper and lower extremities. Good ROM, no contractures. Normal muscle tone.  Skin:  Soft mobile mass noted of the right ankle.  Neurologic: CN 2-12 grossly intact. Sensation intact, DTR normal. Strength 5/5 in all 4.  Psychiatric: Normal judgment and insight. Alert and oriented x 3. Normal mood.     Labs on Admission: I have personally reviewed following labs and imaging studies  CBC:  Recent Labs Lab 04/04/16 1830  WBC 8.1  HGB 13.2  HCT 41.2  MCV 94.1  PLT 157   Basic Metabolic Panel:  Recent Labs Lab 04/04/16 1830  NA 139  K 3.7  CL 99*  CO2 28  GLUCOSE 156*  BUN 26*  CREATININE 1.03*  CALCIUM 9.2   GFR: Estimated Creatinine Clearance: 43.1 mL/min (by C-G  formula based on SCr of 1.03 mg/dL (H)). Liver Function Tests: No results for input(s): AST, ALT, ALKPHOS, BILITOT, PROT, ALBUMIN in the last 168 hours. No results for input(s): LIPASE, AMYLASE in the last 168 hours. No results for input(s): AMMONIA in the last 168 hours. Coagulation Profile:  Recent Labs Lab 04/04/16 1830  INR 1.94   Cardiac Enzymes:  Recent Labs Lab 04/04/16 1830  TROPONINI <0.03   BNP (last 3 results) No results for input(s): PROBNP in the last 8760 hours. HbA1C: No results for input(s): HGBA1C in the last 72 hours. CBG: No results for input(s): GLUCAP in the last 168 hours. Lipid Profile: No results for input(s): CHOL, HDL, LDLCALC, TRIG, CHOLHDL, LDLDIRECT in the last 72 hours. Thyroid Function Tests: No results for input(s): TSH, T4TOTAL, FREET4, T3FREE, THYROIDAB in the last 72 hours. Anemia Panel: No results for input(s): VITAMINB12, FOLATE, FERRITIN, TIBC, IRON, RETICCTPCT in the last 72 hours. Urine analysis:    Component Value Date/Time   COLORURINE YELLOW 05/13/2012 0136   APPEARANCEUR CLEAR 05/13/2012 0136   LABSPEC 1.009 05/13/2012 0136   PHURINE 7.0 05/13/2012 0136   GLUCOSEU NEGATIVE 05/13/2012 0136   HGBUR TRACE (A) 05/13/2012 0136   BILIRUBINUR NEGATIVE 05/13/2012 0136   KETONESUR NEGATIVE 05/13/2012 0136   PROTEINUR NEGATIVE 05/13/2012 0136   UROBILINOGEN 0.2 05/13/2012 0136   NITRITE NEGATIVE 05/13/2012 0136   LEUKOCYTESUR SMALL (A) 05/13/2012 0136   Sepsis Labs: No results found for this or any previous visit (from the past 240 hour(s)).   Radiological Exams on Admission: Dg Chest 2 View  Result Date: 04/04/2016 CLINICAL DATA:  Shortness of breath. EXAM: CHEST  2 VIEW COMPARISON:  Radiographs of February 21, 2016. FINDINGS: Stable cardiomediastinal silhouette. No pneumothorax or pleural effusion is noted. Left-sided pacemaker is unchanged in position. No acute pulmonary disease is noted. Multilevel degenerative disc disease is  noted in the thoracic spine. IMPRESSION: No active cardiopulmonary disease. Electronically Signed   By: Marijo Conception, M.D.   On: 04/04/2016 18:54    EKG: Independently reviewed. Atrial  fibrillation with RVR 144 bpm  Assessment/Plan Atrial fibrillation with rapid ventricular response ([place]): Acute. Heart rates up to 140's. Patient took a extra dose of metoprolol 25 mg PTA.CHA2DS2-VASc =6. - Admit to stepdown - Atrial fibrillation orders are initiated - Check magnesium and TSH - Trend troponins - Continue Cardizem drip and wean off when able  - Start metoprolol 50 mg po BID in a.m. - Cardiology consulted, follow-up for any further recommendations   Suspected COPD exacerbation: Resolving. Patient seen by PCP earlier in the week for complaints of cough, congestion, and wheeze.  - Continue azithromycin, acetaminophen-codeine prn cough, and prednisone taper 30 mg by mouth 2 days - mucinex  Subtherapeutic INR: Patient INR subtherapeutic at 1.94 on admission. Currently on Coumadin. - Coumadin per pharmacy  Essential hypertension - Continue metoprolol as seen above  History of sick sinus syndrome s/p pacemaker  Chronic kidney disease stage III: Stable - continue to monitor  OSA on CPAP: Patient originally wears at home, but declined use last night.  DVT prophylaxis: Coumadin  Code Status: Full Family Communication: No family present at bedside Disposition Plan: Likely discharge home in a.m.  Consults called: Cardiology Admission status:Observation   Norval Morton MD [facility] Pager [phone]  If 7PM-7AM, please contact night-coverage [email or web address] Password [email or web address]  04/05/2016, 12:18 AM

## 2016-04-05 NOTE — Discharge Summary (Signed)
Physician Discharge Summary  Lynn Dunn NFA:[id number] DOB: 07-26-1930 DOA: 04/04/2016  PCP: [name], MD  Admit date: 04/04/2016 Discharge date: 04/05/2016  Admitted From: Home  Disposition:  Home  Recommendations for Outpatient Follow-up:  1. Follow up with PCP in 1- week 2. Patient had metoprolol increased from 25 mg bid to 50 mg bid.   Home Health: No  Equipment/Devices:  NA  Discharge Condition: Stable  CODE STATUS: Full  Diet recommendation: Heart Healthy    Brief/Interim Summary: This is a 83 year old female comes to the hospital with a chief complaint of palpitations, she is known to have paroxysmal atrial dilation, anticoagulated with warfarin, sick sinus syndrome status post pacemaker. The evening of the day of admission, she was sitting when suddenly felt palpitations, her pulse rate was 140, she took an extra dose of metoprolol 25 mg. Her symptoms were associated with chest congestion, dyspnea and chest pain, apparently she was recently diagnosed with a COPD exacerbation. On her initial physical exam ablation her blood pressure was 171/86, heart 148 with atrial fibrillation rhythm, respiratory rate 17, oxygen saturation 92%. Her mucous membranes were moist, her neck was supple, her lungs were clear to auscultation bilaterally, heart S1-S2 present irregularly irregular, positive nonpitting edema bilaterally lower extremities. Sodium 139, potassium 3.7, chloride 99, bicarbonate 28, glucose 156, BUN 26, creatinine 1.03, troponin less than 0.03, white count 8.1, hemoglobin 13.2, hematocrit 41.2, platelets 252, INR 1. Her chest film was hypoinflated, rotated to the left side, no effusions, infiltrates or pneumothorax, pacemaker in place. Her EKG showed atrial fibrillation rhythm, with poor R-wave progression.   The patient was admitted to hospital with a working diagnosis of atrial fibrillation with rapid ventricular response.  1. Atrial fibrillation with rapid ventricular  response. Patient was placed on a continuous infusion of diltiazem, her metoprolol dose was increased from 25 mg to 50 mg, patient successfully converted to a sinus rhythm, her symptoms have improved as well. Cardiology was consulted. Discharge INR 1.9 Echocardiogram from December 2015 showed normal systolic LV function, ejection fraction 60 to 65%  2. Hypokalemia. Lowest potassium 3.1,it was repleted with potassium chloride orally. Patient will continue taking potassium supplements. Continue furosemide 40 mg twice daily.  3. Dyslipidemia. Continue Zetia and Crestor.  4. Bronchitis. Will hold on steroids and antibiotics, will continue supportive symptomatic care with guaifenesin po.     Discharge Diagnoses:  Principal Problem:   Atrial fibrillation with rapid ventricular response (HCC) Active Problems:   Essential hypertension, benign   OSA (obstructive sleep apnea)   COPD exacerbation (HCC)   Subtherapeutic international normalized ratio (INR)    Discharge Instructions   Allergies as of 04/05/2016      Reactions   Ace Inhibitors Other (See Comments)   cough   Albuterol Palpitations, Other (See Comments)   Atrial fibrillation   Albuterol Sulfate Palpitations, Other (See Comments)   A. Fib   Sulfa Antibiotics Rash      Medication List    STOP taking these medications   azithromycin 250 MG tablet Commonly known as:  ZITHROMAX   predniSONE 10 MG tablet Commonly known as:  DELTASONE   rosuvastatin 20 MG tablet Commonly known as:  CRESTOR     TAKE these medications   acetaminophen-codeine 300-30 MG tablet Commonly known as:  TYLENOL #3 Take 1 tablet by mouth 3 (three) times daily as needed for moderate pain.   COQ10 PO Take 1 capsule by mouth every morning.   estradiol 0.1 MG/GM vaginal cream Commonly known  as:  ESTRACE VAGINAL Apply pea-sized amount to vagina / urethra 2-3 x weekly to prevent labial adhesions   furosemide 40 MG tablet Commonly known as:   LASIX Take 1 tablet (40 mg total) by mouth 2 (two) times daily.   guaiFENesin 600 MG 12 hr tablet Commonly known as:  MUCINEX Take 1 tablet (600 mg total) by mouth 2 (two) times daily.   KRILL OIL OMEGA-3 PO Take 1 capsule by mouth twice daily.   Magnesium 100 MG Caps Take 1 capsule by mouth 2 (two) times daily.   metoprolol 50 MG tablet Commonly known as:  LOPRESSOR Take 1 tablet (50 mg total) by mouth 2 (two) times daily. What changed:  See the new instructions.   potassium chloride 10 MEQ tablet Commonly known as:  K-DUR Take 10 mEq by mouth every morning.   traMADol 50 MG tablet Commonly known as:  ULTRAM Take 1-2 tablets (50-100 mg total) by mouth every 6 (six) hours as needed for moderate pain. Post-operatively   Turmeric 500 MG Caps Take 500 mg by mouth 2 (two) times daily.   VITAMIN D PO Take 2 capsules by mouth every morning.   warfarin 1 MG tablet Commonly known as:  COUMADIN Take 3-4 mg by mouth See admin instructions. Take 4 mg by mouth Sunday, Tuesday, Thursday and Saturday. Take 3 mg by mouth on Monday, Wednesday and Friday.       Allergies  Allergen Reactions  . Ace Inhibitors Other (See Comments)    cough  . Albuterol Palpitations    Atrial fibrillation  . Albuterol Sulfate Palpitations    A. Fib  . Sulfa Antibiotics Rash    Consultations:  Cardiology    Procedures/Studies: Dg Chest 2 View  Result Date: 04/04/2016 CLINICAL DATA:  Shortness of breath. EXAM: CHEST  2 VIEW COMPARISON:  Radiographs of February 21, 2016. FINDINGS: Stable cardiomediastinal silhouette. No pneumothorax or pleural effusion is noted. Left-sided pacemaker is unchanged in position. No acute pulmonary disease is noted. Multilevel degenerative disc disease is noted in the thoracic spine. IMPRESSION: No active cardiopulmonary disease. Electronically Signed   By: Marijo Conception, M.D.   On: 04/04/2016 18:54    (Echo, Carotid, EGD, Colonoscopy, ERCP)     Subjective: Patient feeling better, no dyspnea or chest pain, no palpitations, out of bed.   Discharge Exam: Vitals:   04/05/16 0949 04/05/16 1207  BP:  130/61  Pulse: 70 71  Resp:  20  Temp:     Vitals:   04/05/16 0429 04/05/16 0845 04/05/16 0949 04/05/16 1207  BP: (!) 146/79 (!) 127/92  130/61  Pulse: 90 90 70 71  Resp: 17 20  20   Temp: 98 F (36.7 C) 97.3 F (36.3 C)    TempSrc: Other (Comment) Oral    SpO2: 97% 100%  98%  Weight:      Height:        General: Pt is alert, awake, not in acute distress Cardiovascular: RRR, S1/S2 +, no rubs, no gallops Respiratory: CTA bilaterally, no wheezing, no rhonchi Abdominal: Soft, NT, ND, bowel sounds + Extremities: positive non pitting  edema, no cyanosis    The results of significant diagnostics from this hospitalization (including imaging, microbiology, ancillary and laboratory) are listed below for reference.     Microbiology: Recent Results (from the past 240 hour(s))  MRSA PCR Screening     Status: None   Collection Time: 04/05/16  1:05 AM  Result Value Ref Range Status   MRSA by  PCR NEGATIVE NEGATIVE Final    Comment:        The GeneXpert MRSA Assay (FDA approved for NASAL specimens only), is one component of a comprehensive MRSA colonization surveillance program. It is not intended to diagnose MRSA infection nor to guide or monitor treatment for MRSA infections.      Labs: BNP (last 3 results)  Recent Labs  11/25/15 1125  BNP 39.0   Basic Metabolic Panel:  Recent Labs Lab 04/04/16 1830 04/05/16 0557  NA 139 139  K 3.7 3.1*  CL 99* 103  CO2 28 28  GLUCOSE 156* 119*  BUN 26* 21*  CREATININE 1.03* 0.97  CALCIUM 9.2 8.6*  MG  --  2.4   Liver Function Tests: No results for input(s): AST, ALT, ALKPHOS, BILITOT, PROT, ALBUMIN in the last 168 hours. No results for input(s): LIPASE, AMYLASE in the last 168 hours. No results for input(s): AMMONIA in the last 168 hours. CBC:  Recent  Labs Lab 04/04/16 1830 04/05/16 0557  WBC 8.1 8.6  HGB 13.2 12.0  HCT 41.2 37.4  MCV 94.1 93.3  PLT 252 209   Cardiac Enzymes:  Recent Labs Lab 04/04/16 1830 04/05/16 0042 04/05/16 0557  TROPONINI <0.03 0.04* 0.03*   BNP: Invalid input(s): POCBNP CBG: No results for input(s): GLUCAP in the last 168 hours. D-Dimer No results for input(s): DDIMER in the last 72 hours. Hgb A1c No results for input(s): HGBA1C in the last 72 hours. Lipid Profile No results for input(s): CHOL, HDL, LDLCALC, TRIG, CHOLHDL, LDLDIRECT in the last 72 hours. Thyroid function studies  Recent Labs  04/05/16 0557  TSH 2.254   Anemia work up No results for input(s): VITAMINB12, FOLATE, FERRITIN, TIBC, IRON, RETICCTPCT in the last 72 hours. Urinalysis    Component Value Date/Time   COLORURINE YELLOW 05/13/2012 0136   APPEARANCEUR CLEAR 05/13/2012 0136   LABSPEC 1.009 05/13/2012 0136   PHURINE 7.0 05/13/2012 0136   GLUCOSEU NEGATIVE 05/13/2012 0136   HGBUR TRACE (A) 05/13/2012 0136   BILIRUBINUR NEGATIVE 05/13/2012 0136   KETONESUR NEGATIVE 05/13/2012 0136   PROTEINUR NEGATIVE 05/13/2012 0136   UROBILINOGEN 0.2 05/13/2012 0136   NITRITE NEGATIVE 05/13/2012 0136   LEUKOCYTESUR SMALL (A) 05/13/2012 0136   Sepsis Labs Invalid input(s): PROCALCITONIN,  WBC,  LACTICIDVEN Microbiology Recent Results (from the past 240 hour(s))  MRSA PCR Screening     Status: None   Collection Time: 04/05/16  1:05 AM  Result Value Ref Range Status   MRSA by PCR NEGATIVE NEGATIVE Final    Comment:        The GeneXpert MRSA Assay (FDA approved for NASAL specimens only), is one component of a comprehensive MRSA colonization surveillance program. It is not intended to diagnose MRSA infection nor to guide or monitor treatment for MRSA infections.      Time coordinating discharge: 45 minutes  SIGNED:   Tawni Millers, MD  [facility] 04/05/2016, 12:53 PM Pager   If 7PM-7AM, please  contact night-coverage [email or web address] Password [email or web address]

## 2016-04-05 NOTE — Progress Notes (Signed)
MD ordered to wean Cardizem gtt this morning. Since then the gtt has been stopped and the client has converted to NSR. Getting EKG to confirm. I will continue to monitor the client closely.  Saddie Benders RN

## 2016-04-05 NOTE — Progress Notes (Signed)
ANTICOAGULATION CONSULT NOTE - Follow up [facility] for Warfarin Indication: atrial fibrillation  Allergies  Allergen Reactions  . Ace Inhibitors Other (See Comments)    cough  . Albuterol Palpitations    Atrial fibrillation  . Albuterol Sulfate Palpitations    A. Fib  . Sulfa Antibiotics Rash    Patient Measurements: Height: 5\' 1"  (154.9 cm) (Simultaneous filing. User may not have seen previous data.) Weight: 206 lb (93.4 kg) IBW/kg (Calculated) : 47.8  Vital Signs: Temp: 97.3 F (36.3 C) (01/31 0845) Temp Source: Oral (01/31 0845) BP: 127/92 (01/31 0845) Pulse Rate: 70 (01/31 0949)  Labs:  Recent Labs  04/04/16 1830 04/05/16 0042 04/05/16 0557  HGB 13.2  --  12.0  HCT 41.2  --  37.4  PLT 252  --  209  LABPROT 22.4*  --  22.9*  INR 1.94  --  1.99  CREATININE 1.03*  --  0.97  TROPONINI <0.03 0.04* 0.03*    Estimated Creatinine Clearance: 45.8 mL/min (by C-G formula based on SCr of 0.97 mg/dL).   Medical History: Past Medical History:  Diagnosis Date  . Anemia   . Anginal pain ([place]) 2011   had to take nitro   . Arthritis   . COPD (chronic obstructive pulmonary disease) ([place])   . Dysuria   . GERD (gastroesophageal reflux disease)   . Heart murmur   . Hematuria   . HTN (hypertension)   . Hx of echocardiogram    Echo (12/15):  Mild LVH, EF 60-65%, mild MR, mod LAE (LA 49 mm), PASP 43 mmHg  . Obesity   . OSA (obstructive sleep apnea)    cpap at home  . PAF (paroxysmal atrial fibrillation) ([place])    s/p ablation 07/29/09  . Sick sinus syndrome ([facility])    pauses with syncope  . Stroke ([place]) 6/09   NO RESIDUAL PROBLEMS    Medications:  Prescriptions Prior to Admission  Medication Sig Dispense Refill Last Dose  . warfarin (COUMADIN) 3 MG tablet Take 3 mg by mouth daily. Takes an additonal 1 mg on Wednesday and Sunday. Entered in computer.   04/03/2016 at Unknown time  . Cholecalciferol (VITAMIN D PO) Take 2 capsules by mouth every morning.     Taking  . Coenzyme Q10 (COQ10 PO) Take 1 capsule by mouth every morning.    Taking  . estradiol (ESTRACE VAGINAL) 0.1 MG/GM vaginal cream Apply pea-sized amount to vagina / urethra 2-3 x weekly to prevent labial adhesions 42.5 g 12 Taking  . ezetimibe (ZETIA) 10 MG tablet Take 10 mg by mouth daily.    Taking  . furosemide (LASIX) 40 MG tablet Take 1 tablet (40 mg total) by mouth 2 (two) times daily. 180 tablet 3 Taking  . KRILL OIL OMEGA-3 PO Take 1 capsule by mouth twice daily.   Taking  . Magnesium 100 MG CAPS Take 1 capsule by mouth 2 (two) times daily.    Taking  . metoprolol tartrate (LOPRESSOR) 25 MG tablet TAKE 1 TABLET TWICE DAILY.  MAY TAKE ONE EXTRA 25MG  TABLET DAILY IF YOU FEEL YOUR HEART IS OUT OF RHYTHM. 135 tablet 3   . potassium chloride (K-DUR) 10 MEQ tablet Take 10 mEq by mouth every morning.    Taking  . rosuvastatin (CRESTOR) 20 MG tablet Take 1 tablet (20 mg total) by mouth every morning. (Patient not taking: Reported on 04/04/2016) 90 tablet 3 Not Taking at Unknown time  . traMADol (ULTRAM) 50 MG tablet Take  1-2 tablets (50-100 mg total) by mouth every 6 (six) hours as needed for moderate pain. Post-operatively 30 tablet 0 Taking  . warfarin (COUMADIN) 1 MG tablet Take 1 mg by mouth 4 (four) times a week. Takes on Wednesday and Sunday only for a total dose of 4mg  dose.   Taking   Scheduled:  . azithromycin  250 mg Oral Daily  . guaiFENesin  600 mg Oral BID  . metoprolol tartrate  50 mg Oral BID  . predniSONE  30 mg Oral Q breakfast  . Warfarin - Pharmacist Dosing Inpatient   Does not apply q1800   Infusions:  . dilTIAZem HCl-Dextrose Stopped (04/05/16 2080)    Assessment: 83yo female c/o irregular heartbeat w/ palpitations, took extra dose of metoprolol which has resolved sx in past; known h/o Afib; was started on ABX and steroid for COPD exacerbation yesterday; found to be in Afib w/ RVR, to continue warfarin during admission; current INR just below goal w/ last dose  of warfarin PTA taken 1/29. INR today is only just subtherapeutic at 1.99. Got 5 mg boost dose last night.  PTA warfarin dose: 4 mg on Sunday and Wednesday and 3 mg on all other days. DDI: Started azithromycin outpatient in past week  Goal of Therapy:  INR 2-3   Plan:  Give warfarin 4 mg po x 1 Monitor daily INR, CBC, clinical course, s/sx of bleed, PO intake, DDI   Thank you for allowing [place] to participate in this patients care.  Jens Som, PharmD Clinical phone for 04/05/2016 from 7a-3:30p: [phone] If after 3:30p, please call main pharmacy at: [phone] 04/05/2016 11:26 AM

## 2016-04-05 NOTE — Progress Notes (Signed)
The client has been given her discharge instructions and a new medication list along with medication list to take today. She has been given two prescriptions to pick up. Discharging with a friend via car.   Saddie Benders RN

## 2016-04-14 DIAGNOSIS — Z09 Encounter for follow-up examination after completed treatment for conditions other than malignant neoplasm: Secondary | ICD-10-CM | POA: Diagnosis not present

## 2016-04-14 DIAGNOSIS — Z7901 Long term (current) use of anticoagulants: Secondary | ICD-10-CM | POA: Diagnosis not present

## 2016-04-14 DIAGNOSIS — I480 Paroxysmal atrial fibrillation: Secondary | ICD-10-CM | POA: Diagnosis not present

## 2016-04-24 ENCOUNTER — Encounter: Payer: Self-pay | Admitting: Cardiology

## 2016-04-28 DIAGNOSIS — Z7901 Long term (current) use of anticoagulants: Secondary | ICD-10-CM | POA: Diagnosis not present

## 2016-05-04 ENCOUNTER — Ambulatory Visit (INDEPENDENT_AMBULATORY_CARE_PROVIDER_SITE_OTHER): Payer: Medicare Other | Admitting: Cardiology

## 2016-05-04 VITALS — BP 140/80 | HR 56 | Ht 60.0 in | Wt 211.8 lb

## 2016-05-04 DIAGNOSIS — R0600 Dyspnea, unspecified: Secondary | ICD-10-CM

## 2016-05-04 NOTE — Progress Notes (Signed)
05/04/2016 Lynn Dunn   09/17/1930  [id number]  Primary Physician [name], MD Primary Cardiologist: Dr. Irish Lack Electrophysiologist: Dr. Rayann Heman  Reason for Visit/CC: [facility] 2 f/u for Atrial Fibrillation  HPI:  83 y.o. Female, followed by Dr. Irish Lack, with past medical history significant for PAF and CVA on coumadin (CHA2DS2 VASc score Of 6), AF ablation in 2011, sick sinus syndrome (pauses and syncope) and pacemaker (Medtronic Advisa MRI 02/2015), OSA using CPAP, HTN, anemia, Moderate right carotid disease (12/2015), GERD, heart murmur and obesity. Pt had a normal stress test in 01/2014. Echo in 02/2014 showed normal LV EF 60-65%, mild LVH, moderately dilated LA. Dr. Rayann Heman follows her in EP clinic.   She was recently admitted to [facility] on 04/04/2016 for an URI/ suspected COPD exacerbation. She was treated by IM and antibiotics, steroids and supportive care. cariology was consulted for atrial fibrillation w/ RVR. She responded well with IV Cardizem and increased dose of her metoprolol. She converted to NSR.   She now presents back to clinic for post hospital f/u. She continues to be short of breath. She states she was told that she has COPD but has never had PFTs and never seen a pulmonologist. She notes occasional wheezing, however currently no wheezing on exam. Lung exam is clear. Her dyspnea is mainly with exertion. BNP during recent admission was normal. She takes Lasix BID. No orthopnea/PND. She has occasional LEE but nothing significant today.   Current Meds  Medication Sig  . acetaminophen-codeine (TYLENOL #3) 300-30 MG tablet Take 1 tablet by mouth 3 (three) times daily as needed for moderate pain.   . Cholecalciferol (VITAMIN D PO) Take 2 capsules by mouth every morning.   . Coenzyme Q10 (COQ10 PO) Take 1 capsule by mouth every morning.   Marland Kitchen estradiol (ESTRACE VAGINAL) 0.1 MG/GM vaginal cream Apply pea-sized amount to vagina / urethra 2-3 x weekly to prevent labial  adhesions  . furosemide (LASIX) 40 MG tablet Take 1 tablet (40 mg total) by mouth 2 (two) times daily.  Marland Kitchen KRILL OIL OMEGA-3 PO Take 1 capsule by mouth twice daily.  . Magnesium 100 MG CAPS Take 1 capsule by mouth 2 (two) times daily.   . metoprolol (LOPRESSOR) 50 MG tablet Take 1 tablet (50 mg total) by mouth 2 (two) times daily.  . potassium chloride (K-DUR) 10 MEQ tablet Take 10 mEq by mouth every morning.   . traMADol (ULTRAM) 50 MG tablet Take 1-2 tablets (50-100 mg total) by mouth every 6 (six) hours as needed for moderate pain. Post-operatively  . Turmeric 500 MG CAPS Take 500 mg by mouth 2 (two) times daily.  Marland Kitchen warfarin (COUMADIN) 1 MG tablet Take 3-4 mg by mouth See admin instructions. Take 4 mg by mouth Sunday, Tuesday, Thursday and Saturday. Take 3 mg by mouth on Monday, Wednesday and Friday.   Allergies  Allergen Reactions  . Ace Inhibitors Other (See Comments)    cough  . Albuterol Palpitations and Other (See Comments)    Atrial fibrillation  . Albuterol Sulfate Palpitations and Other (See Comments)    A. Fib  . Sulfa Antibiotics Rash   Past Medical History:  Diagnosis Date  . Anemia   . Anginal pain ([place]) 2011   had to take nitro   . Arthritis   . COPD (chronic obstructive pulmonary disease) ([place])   . Dysuria   . GERD (gastroesophageal reflux disease)   . Hematuria   . HTN (hypertension)   . Hx of echocardiogram  Echo (12/15):  Mild LVH, EF 60-65%, mild MR, mod LAE (LA 49 mm), PASP 43 mmHg  . Obesity   . OSA (obstructive sleep apnea)    cpap at home  . PAF (paroxysmal atrial fibrillation) ([place])    s/p ablation 07/29/09  . Sick sinus syndrome ([facility])    pauses with syncope  . Stroke ([place]) 6/09   NO RESIDUAL PROBLEMS   Family History  Problem Relation Age of Onset  . Breast cancer Mother   . Brain cancer Father   . Breast cancer    . Brain cancer    . Stroke Maternal Aunt   . Hypertension Sister   . Heart attack Neg Hx    Past Surgical History:    Procedure Laterality Date  . ABDOMINAL HYSTERECTOMY    . ATRIAL ABLATION SURGERY     s/p ablation for afib 07/29/09  . CYSTOSCOPY N/A 06/23/2015   Procedure: CYSTOSCOPY;  Surgeon: Alexis Frock, MD;  Location: [facility];  Service: Urology;  Laterality: N/A;  . EP IMPLANTABLE DEVICE N/A 02/17/2015   MDT Advisa MRI PPM implanted by Dr Lovena Le for sick sinus and syncope  . EP IMPLANTABLE DEVICE N/A 02/17/2015   Procedure: Loop Recorder Removal;  Surgeon: Evans Lance, MD;  Location: [facility] CV LAB;  Service: Cardiovascular;  Laterality: N/A;  . ESOPHAGOGASTRODUODENOSCOPY  01/24/2012   Procedure: ESOPHAGOGASTRODUODENOSCOPY (EGD);  Surgeon: Jeryl Columbia, MD;  Location: [facility] ENDOSCOPY;  Service: Endoscopy;  Laterality: N/A;  . KNEE ARTHROSCOPY     BILATERAL  . LOOP RECORDER IMPLANT N/A 03/12/2014   Procedure: LOOP RECORDER IMPLANT;  Surgeon: Thompson Grayer, MD;  Location: [facility] CATH LAB;  Service: Cardiovascular;  Laterality: N/A;  . LYSIS OF ADHESION N/A 06/23/2015   Procedure: LYSIS OF LABIAL ADHESION;  Surgeon: Alexis Frock, MD;  Location: [facility];  Service: Urology;  Laterality: N/A;   Social History   Social History  . Marital status: Widowed    Spouse name: N/A  . Number of children: N/A  . Years of education: N/A   Occupational History  . Not on file.   Social History Main Topics  . Smoking status: Never Smoker  . Smokeless tobacco: Never Used  . Alcohol use No  . Drug use: No  . Sexual activity: Not on file   Other Topics Concern  . Not on file   Social History Narrative  . No narrative on file     Review of Systems: General: negative for chills, fever, night sweats or weight changes.  Cardiovascular: negative for chest pain, dyspnea on exertion, edema, orthopnea, palpitations, paroxysmal nocturnal dyspnea or shortness of breath Dermatological: negative for rash Respiratory: negative for cough or wheezing Urologic: negative for hematuria Abdominal: negative for nausea,  vomiting, diarrhea, bright red blood per rectum, melena, or hematemesis Neurologic: negative for visual changes, syncope, or dizziness All other systems reviewed and are otherwise negative except as noted above.   Physical Exam:  Blood pressure 140/80, pulse (!) 56, height 5' (1.524 m), weight 211 lb 12 oz (96 kg), SpO2 95 %.  General appearance: alert, cooperative, no distress and moderately obese Neck: no carotid bruit and no JVD Lungs: clear to auscultation bilaterally Heart: regular rate and rhythm, S1, S2 normal, no murmur, click, rub or gallop Extremities: extremities normal, atraumatic, no cyanosis or edema Pulses: 2+ and symmetric Skin: Skin color, texture, turgor normal. No rashes or lesions Neurologic: Grossly normal  EKG NSR. 68 bpm   ASSESSMENT AND PLAN:  1. Chronic Dyspnea: recent BNP during admission was normal. No active cardiopulmonary disease on CXR. She is felt to have COPD but never formally diagnosed. She has never had PFTs. Her lung exam is normal today. No wheezing/rhonchi. Continue cardioselective BB. We will obtain a 2D echo to assess LVEF and LVEDP. ? If her BNP was falsely low from her obesity. If suggestive of elevated pressures, we will have her increase her home diuretics. If normal echo findings, we will order PFTs and refer her to outpatient pulmonology.  2. PAF: NSR on EKG. Rate is controlled at 68 bpm. Continue Metoprolol. Continue Coumadin for a/c. Her PCI follows her INR.   3. PPM: followed in EP clinic. F/u scheduled 05/25/16  PLAN  F/u with Dr. Irish Lack in 3-4 months   Lyda Jester PA-C 05/04/2016 10:21 AM

## 2016-05-04 NOTE — Patient Instructions (Addendum)
Medication Instructions:   Your physician recommends that you continue on your current medications as directed. Please refer to the Current Medication list given to you today.    If you need a refill on your cardiac medications before your next appointment, please call your pharmacy.  Labwork: NONE ORDERED TODAY     Testing/Procedures: Your physician has requested that you have an echocardiogram. Echocardiography is a painless test that uses sound waves to create images of your heart. It provides your doctor with information about the size and shape of your heart and how well your heart's chambers and valves are working. This procedure takes approximately one hour. There are no restrictions for this procedure.    Follow-Up: Your physician wants you to follow-up in:  IN [place] will receive a reminder letter in the mail two months in advance. If you don't receive a letter, please call our office to schedule the follow-up appointment.      Any Other Special Instructions Will Be Listed Below (If Applicable).

## 2016-05-12 DIAGNOSIS — Z7901 Long term (current) use of anticoagulants: Secondary | ICD-10-CM | POA: Diagnosis not present

## 2016-05-16 DIAGNOSIS — R0602 Shortness of breath: Secondary | ICD-10-CM | POA: Diagnosis not present

## 2016-05-16 DIAGNOSIS — R05 Cough: Secondary | ICD-10-CM | POA: Diagnosis not present

## 2016-05-16 DIAGNOSIS — J441 Chronic obstructive pulmonary disease with (acute) exacerbation: Secondary | ICD-10-CM | POA: Diagnosis not present

## 2016-05-17 ENCOUNTER — Encounter: Payer: Self-pay | Admitting: Nurse Practitioner

## 2016-05-23 ENCOUNTER — Ambulatory Visit (HOSPITAL_COMMUNITY): Payer: Medicare Other | Attending: Cardiology

## 2016-05-23 ENCOUNTER — Other Ambulatory Visit: Payer: Self-pay

## 2016-05-23 DIAGNOSIS — R0600 Dyspnea, unspecified: Secondary | ICD-10-CM | POA: Diagnosis not present

## 2016-05-23 DIAGNOSIS — I495 Sick sinus syndrome: Secondary | ICD-10-CM | POA: Diagnosis not present

## 2016-05-23 DIAGNOSIS — I10 Essential (primary) hypertension: Secondary | ICD-10-CM | POA: Diagnosis not present

## 2016-05-23 DIAGNOSIS — Z8673 Personal history of transient ischemic attack (TIA), and cerebral infarction without residual deficits: Secondary | ICD-10-CM | POA: Diagnosis not present

## 2016-05-23 DIAGNOSIS — E669 Obesity, unspecified: Secondary | ICD-10-CM | POA: Diagnosis not present

## 2016-05-23 DIAGNOSIS — I083 Combined rheumatic disorders of mitral, aortic and tricuspid valves: Secondary | ICD-10-CM | POA: Insufficient documentation

## 2016-05-23 DIAGNOSIS — I4891 Unspecified atrial fibrillation: Secondary | ICD-10-CM | POA: Diagnosis not present

## 2016-05-23 DIAGNOSIS — G4733 Obstructive sleep apnea (adult) (pediatric): Secondary | ICD-10-CM | POA: Diagnosis not present

## 2016-05-24 NOTE — Progress Notes (Signed)
Electrophysiology Office Note Date: 05/25/2016  ID:  Lynn Dunn, Lynn Dunn [birth year], MRN [id number]  PCP: [name], MD Primary Cardiologist: Irish Lack Electrophysiologist: Allred  CC: Pacemaker follow-up  Lynn Dunn is a 83 y.o. female seen today for Dr Rayann Heman.  She presents today for routine electrophysiology followup.  Since last being seen in our clinic, the patient reports doing reasonably well.  She continues to have shortness of breath and has increased Lasix on her own to 80mg  twice daily with some increase in urine output.  She is going on a cruise to [place] next month and is concerned about ongoing symptoms.  Echo results reviewed with patient today.  She denies chest pain, palpitations, PND, orthopnea, nausea, vomiting, dizziness, syncope, weight gain, or early satiety.  Device History: MDT dual chamber PPM implanted 2016 for SSS   Past Medical History:  Diagnosis Date  . Anemia   . Anginal pain ([place]) 2011   had to take nitro   . Arthritis   . COPD (chronic obstructive pulmonary disease) ([place])   . Dysuria   . GERD (gastroesophageal reflux disease)   . Hematuria   . HTN (hypertension)   . Hx of echocardiogram    Echo (12/15):  Mild LVH, EF 60-65%, mild MR, mod LAE (LA 49 mm), PASP 43 mmHg  . Obesity   . OSA (obstructive sleep apnea)    cpap at home  . PAF (paroxysmal atrial fibrillation) ([place])    s/p ablation 07/29/09  . Sick sinus syndrome ([facility])    pauses with syncope  . Stroke ([place]) 6/09   NO RESIDUAL PROBLEMS   Past Surgical History:  Procedure Laterality Date  . ABDOMINAL HYSTERECTOMY    . ATRIAL ABLATION SURGERY     s/p ablation for afib 07/29/09  . CYSTOSCOPY N/A 06/23/2015   Procedure: CYSTOSCOPY;  Surgeon: Alexis Frock, MD;  [facility];  Service: Urology;  Laterality: N/A;  . EP IMPLANTABLE DEVICE N/A 02/17/2015   MDT Advisa MRI PPM implanted by Dr Lovena Le for sick sinus and syncope  . EP IMPLANTABLE DEVICE N/A 02/17/2015   Procedure: Loop Recorder Removal;  Surgeon: Evans Lance, MD;  Location: [place] CV LAB;  Service: Cardiovascular;  Laterality: N/A;  . ESOPHAGOGASTRODUODENOSCOPY  01/24/2012   Procedure: ESOPHAGOGASTRODUODENOSCOPY (EGD);  Surgeon: Jeryl Columbia, MD;  Location: [facility] ENDOSCOPY;  Service: Endoscopy;  Laterality: N/A;  . KNEE ARTHROSCOPY     BILATERAL  . LOOP RECORDER IMPLANT N/A 03/12/2014   Procedure: LOOP RECORDER IMPLANT;  Surgeon: Thompson Grayer, MD;  Location: [facility] CATH LAB;  Service: Cardiovascular;  Laterality: N/A;  . LYSIS OF ADHESION N/A 06/23/2015   Procedure: LYSIS OF LABIAL ADHESION;  Surgeon: Alexis Frock, MD;  [facility];  Service: Urology;  Laterality: N/A;    Current Outpatient Prescriptions  Medication Sig Dispense Refill  . acetaminophen-codeine (TYLENOL #3) 300-30 MG tablet Take 1 tablet by mouth 3 (three) times daily as needed for moderate pain.     . Cholecalciferol (VITAMIN D PO) Take 2 capsules by mouth every morning.     . Coenzyme Q10 (COQ10 PO) Take 1 capsule by mouth every morning.     Marland Kitchen estradiol (ESTRACE VAGINAL) 0.1 MG/GM vaginal cream Apply pea-sized amount to vagina / urethra 2-3 x weekly to prevent labial adhesions 42.5 g 12  . furosemide (LASIX) 40 MG tablet Take 1 tablet (40 mg total) by mouth 2 (two) times daily. (Patient taking differently: Take 80 mg by mouth 2 (two)  times daily. ) 180 tablet 3  . KRILL OIL OMEGA-3 PO Take 1 capsule by mouth twice daily.    . Magnesium 100 MG CAPS Take 1 capsule by mouth 2 (two) times daily.     . metoprolol (LOPRESSOR) 50 MG tablet Take 1 tablet (50 mg total) by mouth 2 (two) times daily. 60 tablet 0  . potassium chloride (K-DUR) 10 MEQ tablet Take 10 mEq by mouth every morning.     . traMADol (ULTRAM) 50 MG tablet Take 1-2 tablets (50-100 mg total) by mouth every 6 (six) hours as needed for moderate pain. Post-operatively 30 tablet 0  . Turmeric 500 MG CAPS Take 500 mg by mouth 2 (two) times daily.    Marland Kitchen warfarin  (COUMADIN) 1 MG tablet Take 3-4 mg by mouth See admin instructions. Take 4 mg by mouth Sunday, Tuesday, Thursday and Saturday. Take 3 mg by mouth on Monday, Wednesday and Friday.     No current facility-administered medications for this visit.     Allergies:   Ace inhibitors; Albuterol; Albuterol sulfate; and Sulfa antibiotics   Social History: Social History   Social History  . Marital status: Widowed    Spouse name: N/A  . Number of children: N/A  . Years of education: N/A   Occupational History  . Not on file.   Social History Main Topics  . Smoking status: Never Smoker  . Smokeless tobacco: Never Used  . Alcohol use No  . Drug use: No  . Sexual activity: Not on file   Other Topics Concern  . Not on file   Social History Narrative  . No narrative on file    Family History: Family History  Problem Relation Age of Onset  . Breast cancer Mother   . Brain cancer Father   . Breast cancer    . Brain cancer    . Stroke Maternal Aunt   . Hypertension Sister   . Heart attack Neg Hx      Review of Systems: All other systems reviewed and are otherwise negative except as noted above.   Physical Exam: VS:  BP 110/68   Pulse 65   Ht 5' (1.524 m)   Wt 210 lb (95.3 kg)   BMI 41.01 kg/m  , BMI Body mass index is 41.01 kg/m.  GEN- The patient is elderly and obese appearing, alert and oriented x 3 today.   HEENT: normocephalic, atraumatic; sclera clear, conjunctiva pink; hearing intact; oropharynx clear; neck supple  Lungs- Clear to ausculation bilaterally, normal work of breathing.  No wheezes, rales, rhonchi Heart- Regular rate and rhythm  GI- soft, non-tender, non-distended, bowel sounds present  Extremities- no clubbing, cyanosis, 1+ BLE edema  MS- no significant deformity or atrophy Skin- warm and dry, no rash or lesion; PPM pocket well healed Psych- euthymic mood, full affect Neuro- strength and sensation are intact  PPM Interrogation- reviewed in detail  today,  See PACEART report  EKG:  EKG is not ordered today.  Recent Labs: 11/25/2015: Brain Natriuretic Peptide 99.5 04/05/2016: BUN 21; Creatinine, Ser 0.97; Hemoglobin 12.0; Magnesium 2.4; Platelets 209; Potassium 3.1; Sodium 139; TSH 2.254   Wt Readings from Last 3 Encounters:  05/25/16 210 lb (95.3 kg)  05/04/16 211 lb 12 oz (96 kg)  04/05/16 206 lb (93.4 kg)     Other studies Reviewed: Additional studies/ records that were reviewed today include: Dr Rayann Heman and cardiology office notes  Assessment and Plan:  1.  Symptomatic bradycardia Normal PPM function  See Pace Art report No changes today  2.  Paroxysmal atrial fibrillation Burden by device interrogation 0.9% V rates controlled Continue Warfarin for CHADS2VASC of 6 Recent CBC stable  3.  OSA Compliant with CPAP  4.  HTN Stable No change required today  5.  Obesity Body mass index is 41.01 kg/m. Weight loss advised  6.  Shortness of breath  Recent echo with diastolic heart failure Diuretic dose increased by patient to 80mg  twice daily 3 days ago BMET today Will order PFT's Follow up with Dr Irish Lack or APP in 1-2 weeks   Current medicines are reviewed at length with the patient today.   The patient does not have concerns regarding her medicines.  The following changes were made today:  none  Labs/ tests ordered today include:  Orders Placed This Encounter  Procedures  . Basic metabolic panel  . Pulmonary Function Test     Disposition:   Follow up with [facility], Dr Rayann Heman 1 year, Dr Irish Lack as scheduled    Signed, Chanetta Marshall, NP 05/25/2016 9:43 AM  [facility] [address] [place] [address] [phone] (office) [phone] (fax

## 2016-05-25 ENCOUNTER — Ambulatory Visit (INDEPENDENT_AMBULATORY_CARE_PROVIDER_SITE_OTHER): Payer: Medicare Other | Admitting: Nurse Practitioner

## 2016-05-25 VITALS — BP 110/68 | HR 65 | Ht 60.0 in | Wt 210.0 lb

## 2016-05-25 DIAGNOSIS — R001 Bradycardia, unspecified: Secondary | ICD-10-CM

## 2016-05-25 DIAGNOSIS — I495 Sick sinus syndrome: Secondary | ICD-10-CM | POA: Diagnosis not present

## 2016-05-25 DIAGNOSIS — R0602 Shortness of breath: Secondary | ICD-10-CM

## 2016-05-25 DIAGNOSIS — I5032 Chronic diastolic (congestive) heart failure: Secondary | ICD-10-CM

## 2016-05-25 DIAGNOSIS — I480 Paroxysmal atrial fibrillation: Secondary | ICD-10-CM

## 2016-05-25 DIAGNOSIS — R2681 Unsteadiness on feet: Secondary | ICD-10-CM | POA: Diagnosis not present

## 2016-05-25 DIAGNOSIS — J449 Chronic obstructive pulmonary disease, unspecified: Secondary | ICD-10-CM | POA: Diagnosis not present

## 2016-05-25 DIAGNOSIS — Z9989 Dependence on other enabling machines and devices: Secondary | ICD-10-CM

## 2016-05-25 DIAGNOSIS — M179 Osteoarthritis of knee, unspecified: Secondary | ICD-10-CM | POA: Diagnosis not present

## 2016-05-25 DIAGNOSIS — M6281 Muscle weakness (generalized): Secondary | ICD-10-CM | POA: Diagnosis not present

## 2016-05-25 DIAGNOSIS — G4733 Obstructive sleep apnea (adult) (pediatric): Secondary | ICD-10-CM | POA: Diagnosis not present

## 2016-05-25 LAB — CUP PACEART INCLINIC DEVICE CHECK
Date Time Interrogation Session: 20180322124756
Implantable Lead Implant Date: 20161214
Implantable Lead Location: 753859
Implantable Lead Location: 753860
MDC IDC LEAD IMPLANT DT: 20161214
MDC IDC PG IMPLANT DT: 20161214

## 2016-05-25 LAB — BASIC METABOLIC PANEL
BUN / CREAT RATIO: 25 (ref 12–28)
BUN: 28 mg/dL — ABNORMAL HIGH (ref 8–27)
CO2: 25 mmol/L (ref 18–29)
Calcium: 9.1 mg/dL (ref 8.7–10.3)
Chloride: 97 mmol/L (ref 96–106)
Creatinine, Ser: 1.1 mg/dL — ABNORMAL HIGH (ref 0.57–1.00)
GFR, EST AFRICAN AMERICAN: 54 mL/min/{1.73_m2} — AB (ref >59)
GFR, EST NON AFRICAN AMERICAN: 47 mL/min/{1.73_m2} — AB (ref >59)
Glucose: 128 mg/dL — ABNORMAL HIGH (ref 65–99)
POTASSIUM: 4.2 mmol/L (ref 3.5–5.2)
SODIUM: 141 mmol/L (ref 134–144)

## 2016-05-25 NOTE — Patient Instructions (Signed)
Medication Instructions:    Your physician recommends that you continue on your current medications as directed. Please refer to the Current Medication list given to you today.    If you need a refill on your cardiac medications before your next appointment, please call your pharmacy.  Labwork: BMET  TODAY    Testing/Procedures: Your physician has recommended that you have a pulmonary function test. Pulmonary Function Tests are a group of tests that measure how well air moves in and out of your lungs.     Follow-Up:   1 TO 2 WEEKS WITH APP   Any Other Special Instructions Will Be Listed Below (If Applicable).

## 2016-05-26 ENCOUNTER — Other Ambulatory Visit: Payer: Self-pay

## 2016-05-26 ENCOUNTER — Encounter: Payer: Self-pay | Admitting: Physician Assistant

## 2016-05-26 DIAGNOSIS — R0602 Shortness of breath: Secondary | ICD-10-CM

## 2016-05-29 DIAGNOSIS — Z803 Family history of malignant neoplasm of breast: Secondary | ICD-10-CM | POA: Diagnosis not present

## 2016-05-29 DIAGNOSIS — Z1231 Encounter for screening mammogram for malignant neoplasm of breast: Secondary | ICD-10-CM | POA: Diagnosis not present

## 2016-06-01 ENCOUNTER — Other Ambulatory Visit: Payer: Medicare Other | Admitting: *Deleted

## 2016-06-01 DIAGNOSIS — R0602 Shortness of breath: Secondary | ICD-10-CM | POA: Diagnosis not present

## 2016-06-02 LAB — BASIC METABOLIC PANEL
BUN/Creatinine Ratio: 33 — ABNORMAL HIGH (ref 12–28)
BUN: 37 mg/dL — AB (ref 8–27)
CALCIUM: 9 mg/dL (ref 8.7–10.3)
CHLORIDE: 95 mmol/L — AB (ref 96–106)
CO2: 26 mmol/L (ref 18–29)
Creatinine, Ser: 1.13 mg/dL — ABNORMAL HIGH (ref 0.57–1.00)
GFR, EST AFRICAN AMERICAN: 52 mL/min/{1.73_m2} — AB (ref >59)
GFR, EST NON AFRICAN AMERICAN: 45 mL/min/{1.73_m2} — AB (ref >59)
Glucose: 119 mg/dL — ABNORMAL HIGH (ref 65–99)
POTASSIUM: 3.6 mmol/L (ref 3.5–5.2)
Sodium: 141 mmol/L (ref 134–144)

## 2016-06-05 ENCOUNTER — Encounter (HOSPITAL_BASED_OUTPATIENT_CLINIC_OR_DEPARTMENT_OTHER): Payer: Self-pay | Admitting: Emergency Medicine

## 2016-06-05 ENCOUNTER — Emergency Department (HOSPITAL_BASED_OUTPATIENT_CLINIC_OR_DEPARTMENT_OTHER)
Admission: EM | Admit: 2016-06-05 | Discharge: 2016-06-05 | Disposition: A | Discharge status: Home / Self Care | Payer: Medicare Other | Attending: Emergency Medicine | Admitting: Emergency Medicine

## 2016-06-05 ENCOUNTER — Emergency Department (HOSPITAL_BASED_OUTPATIENT_CLINIC_OR_DEPARTMENT_OTHER): Payer: Medicare Other

## 2016-06-05 DIAGNOSIS — J449 Chronic obstructive pulmonary disease, unspecified: Secondary | ICD-10-CM | POA: Diagnosis not present

## 2016-06-05 DIAGNOSIS — I4891 Unspecified atrial fibrillation: Secondary | ICD-10-CM | POA: Diagnosis not present

## 2016-06-05 DIAGNOSIS — Z7901 Long term (current) use of anticoagulants: Secondary | ICD-10-CM | POA: Diagnosis not present

## 2016-06-05 DIAGNOSIS — I480 Paroxysmal atrial fibrillation: Secondary | ICD-10-CM | POA: Insufficient documentation

## 2016-06-05 DIAGNOSIS — I10 Essential (primary) hypertension: Secondary | ICD-10-CM | POA: Diagnosis not present

## 2016-06-05 DIAGNOSIS — R002 Palpitations: Secondary | ICD-10-CM

## 2016-06-05 DIAGNOSIS — E876 Hypokalemia: Secondary | ICD-10-CM | POA: Diagnosis not present

## 2016-06-05 LAB — BASIC METABOLIC PANEL
Anion gap: 9 (ref 5–15)
BUN: 42 mg/dL — AB (ref 6–20)
CO2: 30 mmol/L (ref 22–32)
Calcium: 8.9 mg/dL (ref 8.9–10.3)
Chloride: 99 mmol/L — ABNORMAL LOW (ref 101–111)
Creatinine, Ser: 1.29 mg/dL — ABNORMAL HIGH (ref 0.44–1.00)
GFR calc Af Amer: 43 mL/min — ABNORMAL LOW (ref >60)
GFR, EST NON AFRICAN AMERICAN: 37 mL/min — AB (ref >60)
GLUCOSE: 154 mg/dL — AB (ref 65–99)
Potassium: 2.9 mmol/L — ABNORMAL LOW (ref 3.5–5.1)
Sodium: 138 mmol/L (ref 135–145)

## 2016-06-05 LAB — CBC
HCT: 39.6 % (ref 36.0–46.0)
Hemoglobin: 13 g/dL (ref 12.0–15.0)
MCH: 30.7 pg (ref 26.0–34.0)
MCHC: 32.8 g/dL (ref 30.0–36.0)
MCV: 93.4 fL (ref 78.0–100.0)
Platelets: 259 10*3/uL (ref 150–400)
RBC: 4.24 MIL/uL (ref 3.87–5.11)
RDW: 16.2 % — AB (ref 11.5–15.5)
WBC: 8.8 10*3/uL (ref 4.0–10.5)

## 2016-06-05 LAB — MAGNESIUM: Magnesium: 2.3 mg/dL (ref 1.7–2.4)

## 2016-06-05 LAB — PROTIME-INR
INR: 2.4
Prothrombin Time: 26.6 seconds — ABNORMAL HIGH (ref 11.4–15.2)

## 2016-06-05 MED ORDER — DILTIAZEM LOAD VIA INFUSION
10.0000 mg | Freq: Once | INTRAVENOUS | Status: AC
  Administered 2016-06-05: 10 mg via INTRAVENOUS
  Filled 2016-06-05: qty 10

## 2016-06-05 MED ORDER — ACETAMINOPHEN 500 MG PO TABS
1000.0000 mg | ORAL_TABLET | Freq: Once | ORAL | Status: AC
  Administered 2016-06-05: 1000 mg via ORAL
  Filled 2016-06-05: qty 2

## 2016-06-05 MED ORDER — PROPOFOL 10 MG/ML IV BOLUS
80.0000 mg | Freq: Once | INTRAVENOUS | Status: AC
  Administered 2016-06-05: 80 mg via INTRAVENOUS
  Filled 2016-06-05: qty 20

## 2016-06-05 MED ORDER — ONDANSETRON HCL 4 MG/2ML IJ SOLN
4.0000 mg | INTRAMUSCULAR | Status: DC | PRN
  Administered 2016-06-05: 4 mg via INTRAVENOUS
  Filled 2016-06-05: qty 2

## 2016-06-05 MED ORDER — METOPROLOL TARTRATE 5 MG/5ML IV SOLN
5.0000 mg | INTRAVENOUS | Status: AC | PRN
  Administered 2016-06-05 (×3): 5 mg via INTRAVENOUS
  Filled 2016-06-05 (×3): qty 5

## 2016-06-05 MED ORDER — SODIUM CHLORIDE 0.9 % IV BOLUS (SEPSIS)
1000.0000 mL | Freq: Once | INTRAVENOUS | Status: AC
  Administered 2016-06-05: 1000 mL via INTRAVENOUS

## 2016-06-05 MED ORDER — AMIODARONE IV BOLUS ONLY 150 MG/100ML
150.0000 mg | Freq: Once | INTRAVENOUS | Status: AC
  Administered 2016-06-05: 150 mg via INTRAVENOUS
  Filled 2016-06-05: qty 100

## 2016-06-05 MED ORDER — POTASSIUM CHLORIDE CRYS ER 20 MEQ PO TBCR
40.0000 meq | EXTENDED_RELEASE_TABLET | Freq: Once | ORAL | Status: AC
  Administered 2016-06-05: 40 meq via ORAL
  Filled 2016-06-05: qty 2

## 2016-06-05 MED ORDER — AMIODARONE HCL 200 MG PO TABS: ORAL_TABLET | ORAL | 0 refills | Status: DC

## 2016-06-05 MED ORDER — DILTIAZEM HCL 100 MG IV SOLR
5.0000 mg/h | INTRAVENOUS | Status: DC
  Administered 2016-06-05: 5 mg/h via INTRAVENOUS
  Filled 2016-06-05: qty 100

## 2016-06-05 MED ORDER — POTASSIUM CHLORIDE ER 20 MEQ PO TBCR: 40.0000 meq | EXTENDED_RELEASE_TABLET | Freq: Two times a day (BID) | ORAL | 0 refills | Status: DC

## 2016-06-05 NOTE — ED Notes (Signed)
Pt ambulated in the hallway. Pt's rhythm remained in a-fib with a rate under 120. Pt did have some ShOB which pt states is her baseline.

## 2016-06-05 NOTE — ED Triage Notes (Signed)
Pt states around 2 pm she started feeling like she was going into a fib, decided to rest and take an extra metoprolol and heart rate increased

## 2016-06-05 NOTE — ED Notes (Signed)
ED Provider at bedside. 

## 2016-06-05 NOTE — ED Notes (Signed)
Pt converted to NSR at this time. See attached EKG

## 2016-06-05 NOTE — ED Provider Notes (Signed)
[place] DEPT [facility] Provider Note   CSN: [id number] Arrival date & time: 06/05/16  1734  By signing my name below, I, Dora Sims, attest that this documentation has been prepared under the direction and in the presence of physician practitioner, Leo Grosser, MD. Electronically Signed: Dora Sims, Scribe. 06/05/2016. 6:02 PM.  History   Chief Complaint Chief Complaint  Patient presents with  . Rapid Heart Rate    The history is provided by the patient. No language interpreter was used.  Palpitations   This is a recurrent problem. The current episode started 3 to 5 hours ago. The problem occurs constantly. The problem has not changed since onset.On average, each episode lasts 4 hours. Associated symptoms include irregular heartbeat. Pertinent negatives include no chest pain and no shortness of breath. She has tried nothing for the symptoms. Her past medical history is significant for heart disease.     HPI Comments: SYRIA KESTNER is a 83 y.o. female anticoagulated with Warfarin, with PMHx including HTN, PAF, and CAD on pacemaker, who presents to the Emergency Department complaining of persistent palpitations since 230 PM this afternoon. No associated symptoms noted. She states she feels like she is going into atrial fibrillation which happens intermittently. Pt states she was in a-fib last week for about 12 hours and her heart rhythm became regular on its own. She notes she felt normal yesterday. She is on metoprolol 50 mg BID and has been compliant with this medication. Took extra dose today after onset of atrial fibrillation. Pt denies chest pain, SOB, or any other associated symptoms.  Past Medical History:  Diagnosis Date  . Anemia   . Anginal pain ([place]) 2011   had to take nitro   . Arthritis   . COPD (chronic obstructive pulmonary disease) ([place])   . Dysuria   . GERD (gastroesophageal reflux disease)   . Hematuria   . HTN (hypertension)   . Hx of echocardiogram    Echo (12/15):  Mild LVH, EF 60-65%, mild MR, mod LAE (LA 49 mm), PASP 43 mmHg  . Obesity   . OSA (obstructive sleep apnea)    cpap at home  . PAF (paroxysmal atrial fibrillation) ([place])    s/p ablation 07/29/09  . Sick sinus syndrome ([facility])    pauses with syncope  . Stroke ([place]) 6/09   NO RESIDUAL PROBLEMS    Patient Active Problem List   Diagnosis Date Noted  . COPD exacerbation ([place]) 04/05/2016  . Subtherapeutic international normalized ratio (INR) 04/05/2016  . Atrial fibrillation with rapid ventricular response ([place]) 04/04/2016  . Carotid artery disease ([place]) 11/25/2015  . Pre-syncope 02/16/2015  . Sick sinus syndrome ([place]) 02/15/2015  . Swelling of right lower extremity 08/31/2014  . Mixed hyperlipidemia 12/27/2012  . Obesity 12/27/2012  . GI bleed 01/23/2012  . Anemia 01/23/2012  . History of CVA (cerebrovascular accident) 01/23/2012  . SHORTNESS OF BREATH (SOB) 09/13/2009  . Essential hypertension, benign 06/17/2009  . Atrial fibrillation ([place]) 06/17/2009  . OSA (obstructive sleep apnea) 06/17/2009    Past Surgical History:  Procedure Laterality Date  . ABDOMINAL HYSTERECTOMY    . ATRIAL ABLATION SURGERY     s/p ablation for afib 07/29/09  . CYSTOSCOPY N/A 06/23/2015   Procedure: CYSTOSCOPY;  Surgeon: Alexis Frock, MD;  Location: [facility];  Service: Urology;  Laterality: N/A;  . EP IMPLANTABLE DEVICE N/A 02/17/2015   MDT Advisa MRI PPM implanted by Dr Lovena Le for sick sinus and syncope  . EP IMPLANTABLE DEVICE N/A  02/17/2015   Procedure: Loop Recorder Removal;  Surgeon: Evans Lance, MD;  Location: [facility];  Service: Cardiovascular;  Laterality: N/A;  . ESOPHAGOGASTRODUODENOSCOPY  01/24/2012   Procedure: ESOPHAGOGASTRODUODENOSCOPY (EGD);  Surgeon: Jeryl Columbia, MD;  Location: [facility] ENDOSCOPY;  Service: Endoscopy;  Laterality: N/A;  . KNEE ARTHROSCOPY     BILATERAL  . LOOP RECORDER IMPLANT N/A 03/12/2014   Procedure: LOOP RECORDER IMPLANT;  Surgeon: Thompson Grayer, MD;  Location: [facility] CATH LAB;  Service: Cardiovascular;  Laterality: N/A;  . LYSIS OF ADHESION N/A 06/23/2015   Procedure: LYSIS OF LABIAL ADHESION;  Surgeon: Alexis Frock, MD;  Location: [facility] ORS;  Service: Urology;  Laterality: N/A;    OB History    No data available      Home Medications    Prior to Admission medications   Medication Sig Start Date End Date Taking? Authorizing Provider  acetaminophen-codeine (TYLENOL #3) 300-30 MG tablet Take 1 tablet by mouth 3 (three) times daily as needed for moderate pain.  04/03/16   Historical Provider, MD  Cholecalciferol (VITAMIN D PO) Take 2 capsules by mouth every morning.     Historical Provider, MD  Coenzyme Q10 (COQ10 PO) Take 1 capsule by mouth every morning.     Historical Provider, MD  estradiol (ESTRACE VAGINAL) 0.1 MG/GM vaginal cream Apply pea-sized amount to vagina / urethra 2-3 x weekly to prevent labial adhesions 06/23/15   Alexis Frock, MD  furosemide (LASIX) 40 MG tablet Take 1 tablet (40 mg total) by mouth 2 (two) times daily. Patient taking differently: Take 80 mg by mouth 2 (two) times daily.  10/21/14   Jettie Booze, MD  KRILL OIL OMEGA-3 PO Take 1 capsule by mouth twice daily.    Historical Provider, MD  Magnesium 100 MG CAPS Take 1 capsule by mouth 2 (two) times daily.     Historical Provider, MD  metoprolol (LOPRESSOR) 50 MG tablet Take 1 tablet (50 mg total) by mouth 2 (two) times daily. 04/05/16   Mauricio Gerome Apley, MD  potassium chloride (K-DUR) 10 MEQ tablet Take 10 mEq by mouth every morning.     Historical Provider, MD  traMADol (ULTRAM) 50 MG tablet Take 1-2 tablets (50-100 mg total) by mouth every 6 (six) hours as needed for moderate pain. Post-operatively 06/23/15   Alexis Frock, MD  Turmeric 500 MG CAPS Take 500 mg by mouth 2 (two) times daily.    Historical Provider, MD  warfarin (COUMADIN) 1 MG tablet Take 3-4 mg by mouth See admin instructions. Take 4 mg by mouth Sunday, Tuesday, Thursday and  Saturday. Take 3 mg by mouth on Monday, Wednesday and Friday.    Historical Provider, MD    Family History Family History  Problem Relation Age of Onset  . Breast cancer Mother   . Brain cancer Father   . Breast cancer    . Brain cancer    . Stroke Maternal Aunt   . Hypertension Sister   . Heart attack Neg Hx     Social History Social History  Substance Use Topics  . Smoking status: Never Smoker  . Smokeless tobacco: Never Used  . Alcohol use No     Allergies   Ace inhibitors; Albuterol; Albuterol sulfate; and Sulfa antibiotics   Review of Systems Review of Systems  Respiratory: Negative for shortness of breath.   Cardiovascular: Positive for palpitations. Negative for chest pain.  Hematological: Bruises/bleeds easily.  All other systems reviewed and are negative.  Physical  Exam Updated Vital Signs BP 138/71 (BP Location: Left Arm)   Pulse (!) 147   Temp 98.2 F (36.8 C) (Oral)   Resp (!) 24   Ht 5' (1.524 m)   Wt 205 lb (93 kg)   SpO2 99%   BMI 40.04 kg/m   Physical Exam  Constitutional: She is oriented to person, place, and time. She appears well-developed and well-nourished. No distress.  HENT:  Head: Normocephalic.  Nose: Nose normal.  Eyes: Conjunctivae are normal.  Neck: Neck supple. No tracheal deviation present.  Cardiovascular: S1 normal and S2 normal.  An irregularly irregular rhythm present. Tachycardia present.  Exam reveals no gallop and no friction rub.   No murmur heard. Pulmonary/Chest: Effort normal and breath sounds normal. No respiratory distress.  Normal lung sounds bilaterally.  Abdominal: Soft. She exhibits no distension.  Neurological: She is alert and oriented to person, place, and time.  Skin: Skin is warm and dry.  Psychiatric: She has a normal mood and affect.   ED Treatments / Results  Labs (all labs ordered are listed, but only abnormal results are displayed) Labs Reviewed  BASIC METABOLIC PANEL - Abnormal; Notable for  the following:       Result Value   Potassium 2.9 (*)    Chloride 99 (*)    Glucose, Bld 154 (*)    BUN 42 (*)    Creatinine, Ser 1.29 (*)    GFR calc non Af Amer 37 (*)    GFR calc Af Amer 43 (*)    All other components within normal limits  CBC - Abnormal; Notable for the following:    RDW 16.2 (*)    All other components within normal limits  PROTIME-INR - Abnormal; Notable for the following:    Prothrombin Time 26.6 (*)    All other components within normal limits  MAGNESIUM    EKG  EKG Interpretation  Date/Time:  Monday June 05 2016 17:42:52 EDT Ventricular Rate:  138 PR Interval:    QRS Duration: 86 QT Interval:  316 QTC Calculation: 478 R Axis:   -29 Text Interpretation:  Atrial fibrillation with rapid ventricular response New since previous tracing Septal infarct , age undetermined Marked ST abnormality, possible inferolateral subendocardial injury likely rate related Abnormal ECG Confirmed by Vincent Ehrler MD, Devyn Sheerin [phone]) on 06/05/2016 6:14:02 PM       EKG Interpretation  Date/Time:  Monday June 05 2016 20:33:51 EDT Ventricular Rate:  89 PR Interval:    QRS Duration: 98 QT Interval:  397 QTC Calculation: 484 R Axis:   -19 Text Interpretation:  Afib/flut and V-paced complexes Borderline left axis deviation Consider anterior infarct Since last tracing rate slower Otherwise no significant change Confirmed by Denyse Fillion MD, Amey Hossain ([id number]) on 06/05/2016 11:31:29 PM        EKG Interpretation  Date/Time:  Monday June 05 2016 23:08:05 EDT Ventricular Rate:  67 PR Interval:    QRS Duration: 99 QT Interval:  417 QTC Calculation: 441 R Axis:   -15 Text Interpretation:  now Sinus rhythm Borderline left axis deviation Anterior infarct, old Atrial fibrillation RESOLVED SINCE PREVIOUS Confirmed by Shantay Sonn MD, Cally Nygard [phone]) on 06/05/2016 11:33:21 PM        Radiology Dg Chest Port 1 View  Result Date: 06/05/2016 CLINICAL DATA:  Palpitations EXAM: PORTABLE CHEST 1 VIEW  COMPARISON:  Chest radiograph 04/04/2016 FINDINGS: Left chest wall pacemaker leads are in unchanged position. The lungs are clear. No pleural effusion or pneumothorax. Normal cardiomediastinal contours. IMPRESSION: No  active disease. Electronically Signed   By: Ulyses Jarred M.D.   On: 06/05/2016 18:45    Procedures .Cardioversion Date/Time: 06/05/2016 9:00 PM Performed by: Leo Grosser Authorized by: Leo Grosser   Consent:    Consent obtained:  Verbal and written   Consent given by:  Patient   Risks discussed:  Cutaneous burn, induced arrhythmia and pain   Alternatives discussed:  No treatment, rate-control medication, alternative treatment, referral and observation Pre-procedure details:    Cardioversion basis:  Elective   Rhythm:  Atrial fibrillation   Electrode placement:  Anterior-posterior Attempt one:    Cardioversion mode:  Synchronous   Waveform:  Biphasic   Shock (Joules):  150   Shock outcome:  No change in rhythm Attempt two:    Cardioversion mode:  Synchronous   Waveform:  Biphasic   Shock (Joules):  200   Shock outcome:  No change in rhythm Attempt three:    Cardioversion mode:  Synchronous   Shock (Joules):  200   Shock outcome:  No change in rhythm Post-procedure details:    Patient status:  Awake   Patient tolerance of procedure:  Tolerated well, no immediate complications   (including critical care time) CRITICAL CARE Performed by: Leo Grosser Total critical care time: 30 minutes Critical care time was exclusive of separately billable procedures and treating other patients. Critical care was necessary to treat or prevent imminent or life-threatening deterioration. Critical care was time spent personally by me on the following activities: development of treatment plan with patient and/or surrogate as well as nursing, discussions with consultants, evaluation of patient's response to treatment, examination of patient, obtaining history from patient or  surrogate, ordering and performing treatments and interventions, ordering and review of laboratory studies, ordering and review of radiographic studies, pulse oximetry and re-evaluation of patient's condition.  Procedural sedation Performed by: Leo Grosser Consent: Verbal consent obtained. Risks and benefits: risks, benefits and alternatives were discussed Required items: required blood products, implants, devices, and special equipment available Patient identity confirmed: arm band and provided demographic data Time out: Immediately prior to procedure a "time out" was called to verify the correct patient, procedure, equipment, support staff and site/side marked as required.  Sedation type: moderate (conscious) sedation NPO time confirmed and considedered  Sedatives: PROPOFOL  Physician Time at Bedside: 20 minutes  Vitals: Vital signs were monitored during sedation. Cardiac Monitor, pulse oximeter Patient tolerance: Patient tolerated the procedure well with no immediate complications. Comments: Pt with uneventful recovered. Returned to pre-procedural sedation baseline    DIAGNOSTIC STUDIES: Oxygen Saturation is 99% on RA, normal by my interpretation.    COORDINATION OF CARE: 6:09 PM Discussed treatment plan with pt at bedside and pt agreed to plan.  Medications Ordered in ED Medications  ondansetron (ZOFRAN) injection 4 mg (4 mg Intravenous Given 06/05/16 1945)  metoprolol (LOPRESSOR) injection 5 mg (5 mg Intravenous Given 06/05/16 1848)  potassium chloride SA (K-DUR,KLOR-CON) CR tablet 40 mEq (40 mEq Oral Given 06/05/16 1856)  propofol (DIPRIVAN) 10 mg/mL bolus/IV push 80 mg (80 mg Intravenous Given 06/05/16 1939)  diltiazem (CARDIZEM) 1 mg/mL load via infusion 10 mg (10 mg Intravenous Bolus from Bag 06/05/16 2024)  sodium chloride 0.9 % bolus 1,000 mL (0 mLs Intravenous Stopped 06/05/16 2030)  amiodarone (NEXTERONE) IV bolus only 150 mg/100 mL (0 mg Intravenous Stopped 06/05/16 2144)    acetaminophen (TYLENOL) tablet 1,000 mg (1,000 mg Oral Given 06/05/16 2200)     Initial Impression / Assessment and Plan / ED Course  I have reviewed the triage vital signs and the nursing notes.  Pertinent labs & imaging results that were available during my care of the patient were reviewed by me and considered in my medical decision making (see chart for details).     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 She is currently ymptomatic from palpitations but no syncope, BP stable here. Anticoagulated on coumadin with therapeutic INR here and good candidate for elective cardioversion. Preferred to try IV lopressor first which improved rate but remained tachycardic and then agreed to attempt cardioversion. 3 attempts were unsuccessful. Started on cardizem bolus and gtt which achieved rate control. Discussed with Dr Meda Coffee of her cardiology team who recommended amio IV bolus and if patient is rate controlled and well enogh to return home to start [facility] taper. Or they will admit to transition to PO cardizem if unable. After amio bolus Pt converted to sinus rhythm. She has some left lateral MSK chest pain reproducible that is likely related to repeated cardioversion attempts and was provided tylenol. Potassium was repleted here and given Rx for home. Discussed close f/u with cardiology prior to any travel as she plans to go to [place] soon. Plan to follow up with PCP as needed and return precautions discussed for worsening or new concerning symptoms.   Final Clinical Impressions(s) / ED Diagnoses   Final diagnoses:  Acute hypokalemia  Atrial fibrillation with RVR ([place])    New Prescriptions Discharge Medication List as of 06/05/2016 11:17 PM    START taking these medications   Details  amiodarone (PACERONE) 200 MG tablet Take 400 mg 3 times a day for 5 days followed by 400 mg twice a day for 5 days followed by 400 mg once daily for 5 days, Print        I personally performed the services described in this documentation, which was scribed in my presence. The recorded information has been reviewed and is accurate.      Leo Grosser, MD 06/06/16 [phone]

## 2016-06-08 NOTE — Progress Notes (Signed)
Cardiology Office Note    Date:  06/09/2016   ID:  Lynn Dunn, DOB 08/30/1930, MRN [id number]  PCP:  [name], MD  Cardiologist: Dr. Irish Lack / Dr. Rayann Heman (EP)  CC: follow up  History of Present Illness:  Lynn Dunn is a 83 y.o. female with a history of PAF s/p ablation (2011) on coumadin, CVA, SSS s/p MDT PPM (02/2015), OSA on CPAP, HTN, anemia, carotid artery disease, GERD, obesity, COPD who presents to clinic for follow up.   Pt had a normal stress test in 01/2014. Echo in 02/2014 showed normal LV EF 60-65%, mild LVH, moderately dilated LA. Dr. Rayann Heman follows her in EP clinic.   She was recently admitted to [facility] on 04/04/2016 for an URI/ suspected COPD exacerbation. She was treated by IM and antibiotics, steroids and supportive care. Cariology was consulted for atrial fibrillation w/ RVR. She responded well with IV Cardizem and increased dose of her metoprolol. She converted to NSR.   She was seen by Lyda Jester PA-C on 05/04/16. She complained of dyspnea. BNP was normal during her most recent admission but can be falsely low in the setting of obesity,  so 2D ECHO was ordered to evaluate for diastolic dysfcuntion and elevated filling pressures. 2D ECHO completed on 3/20 did show G2DD and high filling pressures so lasix was increased to 60mg  BID and KDur 19mEq BID. Patient saw Chanetta Marshall NP in the office and she told her that she has self increased lasix to 80mg  BID. Creat bumped to 1.13 and lasix decreased back 40mg  BID.   She was then seen in the ER on 06/05/16 with afib with RVR. She underwent DCCV with three unsuccessful shocks. She was then started on IV amiodarone and converted to NSR. SHe was discharged home on amio load (400mg  TID x 3 day, 400mg  BID x 5 day and 400mg  daily x 5 days). K was noted to be 2.9 and potassium was increased to 22mEq BID.  Today she presents to clinic for follow up. Still having some SOB and scheduled to see pulmonology in May. She did feel  a little better on increased lasix but now back to original dosing due to AKI. No LE edema, orthopnea, or PND. She does wear her CPAP. No dizziness or syncope. She has been depressed since loosing her husband. Looking forward to going to [place] with sisters in a couple weeks.       Past Medical History:  Diagnosis Date  . Anemia   . Anginal pain ([place]) 2011   had to take nitro   . Arthritis   . COPD (chronic obstructive pulmonary disease) ([place])   . Dysuria   . GERD (gastroesophageal reflux disease)   . Hematuria   . HTN (hypertension)   . Hx of echocardiogram    Echo (12/15):  Mild LVH, EF 60-65%, mild MR, mod LAE (LA 49 mm), PASP 43 mmHg  . Obesity   . OSA (obstructive sleep apnea)    cpap at home  . PAF (paroxysmal atrial fibrillation) ([place])    s/p ablation 07/29/09  . Sick sinus syndrome ([facility])    pauses with syncope  . Stroke ([place]) 6/09   NO RESIDUAL PROBLEMS    Past Surgical History:  Procedure Laterality Date  . ABDOMINAL HYSTERECTOMY    . ATRIAL ABLATION SURGERY     s/p ablation for afib 07/29/09  . CYSTOSCOPY N/A 06/23/2015   Procedure: CYSTOSCOPY;  Surgeon: Alexis Frock, MD;  Location: [facility];  Service: Urology;  Laterality: N/A;  . EP IMPLANTABLE DEVICE N/A 02/17/2015   MDT Advisa MRI PPM implanted by Dr Lovena Le for sick sinus and syncope  . EP IMPLANTABLE DEVICE N/A 02/17/2015   Procedure: Loop Recorder Removal;  Surgeon: Evans Lance, MD;  Location: [place] CV LAB;  Service: Cardiovascular;  Laterality: N/A;  . ESOPHAGOGASTRODUODENOSCOPY  01/24/2012   Procedure: ESOPHAGOGASTRODUODENOSCOPY (EGD);  Surgeon: Jeryl Columbia, MD;  Location: [facility] ENDOSCOPY;  Service: Endoscopy;  Laterality: N/A;  . KNEE ARTHROSCOPY     BILATERAL  . LOOP RECORDER IMPLANT N/A 03/12/2014   Procedure: LOOP RECORDER IMPLANT;  Surgeon: Lilymarie Scroggins Grayer, MD;  Location: [facility] CATH LAB;  Service: Cardiovascular;  Laterality: N/A;  . LYSIS OF ADHESION N/A 06/23/2015   Procedure: LYSIS OF LABIAL  ADHESION;  Surgeon: Alexis Frock, MD;  Location: [facility]S;  Service: Urology;  Laterality: N/A;    Current Medications: Outpatient Medications Prior to Visit  Medication Sig Dispense Refill  . acetaminophen-codeine (TYLENOL #3) 300-30 MG tablet Take 1 tablet by mouth 3 (three) times daily as needed for moderate pain.     Marland Kitchen amiodarone (PACERONE) 200 MG tablet Take 400 mg 3 times a day for 5 days followed by 400 mg twice a day for 5 days followed by 400 mg once daily for 5 days 60 tablet 0  . Cholecalciferol (VITAMIN D PO) Take 2 capsules by mouth every morning.     . Coenzyme Q10 (COQ10 PO) Take 1 capsule by mouth every morning.     Marland Kitchen estradiol (ESTRACE VAGINAL) 0.1 MG/GM vaginal cream Apply pea-sized amount to vagina / urethra 2-3 x weekly to prevent labial adhesions 42.5 g 12  . KRILL OIL OMEGA-3 PO Take 1 capsule by mouth twice daily.    . Magnesium 100 MG CAPS Take 1 capsule by mouth 2 (two) times daily.     . metoprolol (LOPRESSOR) 50 MG tablet Take 1 tablet (50 mg total) by mouth 2 (two) times daily. 60 tablet 0  . traMADol (ULTRAM) 50 MG tablet Take 1-2 tablets (50-100 mg total) by mouth every 6 (six) hours as needed for moderate pain. Post-operatively 30 tablet 0  . Turmeric 500 MG CAPS Take 500 mg by mouth 2 (two) times daily.    Marland Kitchen warfarin (COUMADIN) 1 MG tablet Take 3-4 mg by mouth See admin instructions. Take 4 mg by mouth Sunday, Tuesday, Thursday and Saturday. Take 3 mg by mouth on Monday, Wednesday and Friday.    . furosemide (LASIX) 40 MG tablet Take 1 tablet (40 mg total) by mouth 2 (two) times daily. (Patient taking differently: Take 80 mg by mouth 2 (two) times daily. ) 180 tablet 3  . potassium chloride 20 MEQ TBCR Take 40 mEq by mouth 2 (two) times daily. 12 tablet 0   No facility-administered medications prior to visit.      Allergies:   Ace inhibitors; Albuterol; Albuterol sulfate; and Sulfa antibiotics   Social History   Social History  . Marital status: Widowed     Spouse name: N/A  . Number of children: N/A  . Years of education: N/A   Social History Main Topics  . Smoking status: Never Smoker  . Smokeless tobacco: Never Used  . Alcohol use No  . Drug use: No  . Sexual activity: Not Asked   Other Topics Concern  . None   Social History Narrative  . None     Family History:  The patient's family history includes Brain cancer  in her father; Breast cancer in her mother; Hypertension in her sister; Stroke in her maternal aunt.     ROS:   Please see the history of present illness.    ROS All other systems reviewed and are negative.   PHYSICAL EXAM:   VS:  BP 130/80   Pulse 60   Ht 5' (1.524 m)   Wt 211 lb 12.8 oz (96.1 kg)   BMI 41.36 kg/m    GEN: Well nourished, well developed, in no acute distress, obese  HEENT: normal  Neck: no JVD, carotid bruits, or masses Cardiac: RRR; no murmurs, rubs, or gallops,no edema  Respiratory:  clear to auscultation bilaterally, normal work of breathing GI: soft, nontender, nondistended, + BS MS: no deformity or atrophy  Skin: warm and dry, no rash Neuro:  Alert and Oriented x 3, Strength and sensation are intact Psych: euthymic mood, full affect     Wt Readings from Last 3 Encounters:  06/09/16 211 lb 12.8 oz (96.1 kg)  06/05/16 205 lb (93 kg)  05/25/16 210 lb (95.3 kg)      Studies/Labs Reviewed:   EKG:  EKG is ordered today.  The ekg ordered today demonstrates A paced rhythm  Recent Labs: 11/25/2015: Brain Natriuretic Peptide 99.5 04/05/2016: TSH 2.254 06/05/2016: BUN 42; Creatinine, Ser 1.29; Hemoglobin 13.0; Magnesium 2.3; Platelets 259; Potassium 2.9; Sodium 138   Lipid Panel    Component Value Date/Time   CHOL 170 08/31/2014 0941   TRIG 162.0 (H) 08/31/2014 0941   HDL 51.80 08/31/2014 0941   CHOLHDL 3 08/31/2014 0941   VLDL 32.4 08/31/2014 0941   LDLCALC 86 08/31/2014 0941    Additional studies/ records that were reviewed today include:  2D ECHO: 05/23/2016 LV EF: 60% -    65% Study Conclusions - Left ventricle: The cavity size was normal. Systolic function was   normal. The estimated ejection fraction was in the range of 60%   to 65%. Wall motion was normal; there were no regional wall   motion abnormalities. Features are consistent with a pseudonormal   left ventricular filling pattern, with concomitant abnormal   relaxation and increased filling pressure (grade 2 diastolic   dysfunction). Doppler parameters are consistent with high   ventricular filling pressure. - Aortic valve: Trileaflet; mildly thickened, moderately calcified   leaflets. There was very mild stenosis. There was mild   regurgitation. Mean gradient (S): 11 mm Hg. Valve area (VTI):   1.63 cm^2. Valve area (Vmax): 1.53 cm^2. Valve area (Vmean): 1.53   cm^2. Regurgitation pressure half-time: 502 ms. - Mitral valve: Moderately calcified annulus. There was mild   regurgitation. Mean gradient (D): 3 mm Hg. Valve area by pressure   half-time: 1.93 cm^2. Valve area by continuity equation (using   LVOT flow): 1.61 cm^2. - Left atrium: The atrium was mildly dilated. - Right ventricle: The cavity size was mildly dilated. Wall   thickness was normal. Pacer wire or catheter noted in right   ventricle. - Atrial septum: There was increased thickness of the septum,   consistent with lipomatous hypertrophy. - Tricuspid valve: There was trivial regurgitation. - Pulmonary arteries: PA peak pressure: 35 mm Hg (S).   ASSESSMENT & PLAN:   PAF s/p ablation (2011) on coumadin: recently has had episodes of afib with RVR in 03/2016 in the setting of URI and more recently on 06/05/16 that failed DCCV x3 but finally converted on IV amiodarone load. Discharged from ER on amiodarone 400mg  TID x 3 day, 400mg  BID  x 5 day and 400mg  daily x 5 days. She really does not want to be on amiodarone long term due to long list of side effects. She is also very worried since he is supposed to go to [place] on 06/22/16. I have asked  her to continue amiodarone over the next couple months to get her through her vacation and have written for amio 200mg  daily to start when she is finished with load. Then she can follow up with Amber or Dr. Rayann Heman to see if she needs to stay on amiodarone long term. She is scheduled for PFTs and recent TSH normal. Would need LFTs if going to stay on long term. -- Continue coumadin for CHADSVASC score of at least 6. INR [facility].   Chronic diastolic CHF: appears euvolemic. Lasix was increased to 60mg  BID given diastolic dysfunction and elevated filling pressures on echo. She ended up taking it 80mg  BID (self prescribed) and had some AKI and told to go back down to 40mg  BID. WIll check BMET today. She has pending PFTs and pulm follow up arranged.   Obesity: Body mass index is 73.71 kg/m. this certainly could be contributing to dyspnea  OSA: compliant with CPAP  SSS s/p PPM: followed by Dr. Rayann Heman  Hypokalemia: K 2.9 in ER earlier this week. Kdur increased. Will recheck BMET   Medication Adjustments/Labs and Tests Ordered: Current medicines are reviewed at length with the patient today.  Concerns regarding medicines are outlined above.  Medication changes, Labs and Tests ordered today are listed in the Patient Instructions below. Patient Instructions  Medication Instructions:  Your physician recommends that you continue on your current medications as directed. Please refer to the Current Medication list given to you today.  We have sent in a prescription for the Amiodarone 200 mg daily for when you need it.  Labwork: TODAY:  BMET  Testing/Procedures: None ordered  Follow-Up: Your physician recommends that you schedule a follow-up appointment in: 2 MONTHS WITH DR. ALLRED / Chanetta Marshall, NP   Any Other Special Instructions Will Be Listed Below (If Applicable).     If you need a refill on your cardiac medications before your next appointment, please call your pharmacy.        Signed, Angelena Form, PA-C  06/09/2016 10:58 AM    [facility] [address], [place], [place]  [address] Phone: [phone]; Fax: [phone]

## 2016-06-09 ENCOUNTER — Encounter: Payer: Self-pay | Admitting: Physician Assistant

## 2016-06-09 ENCOUNTER — Ambulatory Visit (INDEPENDENT_AMBULATORY_CARE_PROVIDER_SITE_OTHER): Payer: Medicare Other | Admitting: Physician Assistant

## 2016-06-09 VITALS — BP 130/80 | HR 60 | Ht 60.0 in | Wt 211.8 lb

## 2016-06-09 DIAGNOSIS — Z7901 Long term (current) use of anticoagulants: Secondary | ICD-10-CM | POA: Diagnosis not present

## 2016-06-09 DIAGNOSIS — G4733 Obstructive sleep apnea (adult) (pediatric): Secondary | ICD-10-CM

## 2016-06-09 DIAGNOSIS — I480 Paroxysmal atrial fibrillation: Secondary | ICD-10-CM

## 2016-06-09 DIAGNOSIS — Z950 Presence of cardiac pacemaker: Secondary | ICD-10-CM | POA: Diagnosis not present

## 2016-06-09 DIAGNOSIS — I5032 Chronic diastolic (congestive) heart failure: Secondary | ICD-10-CM

## 2016-06-09 DIAGNOSIS — Z9989 Dependence on other enabling machines and devices: Secondary | ICD-10-CM | POA: Diagnosis not present

## 2016-06-09 LAB — BASIC METABOLIC PANEL
BUN / CREAT RATIO: 28 (ref 12–28)
BUN: 35 mg/dL — AB (ref 8–27)
CO2: 27 mmol/L (ref 18–29)
CREATININE: 1.26 mg/dL — AB (ref 0.57–1.00)
Calcium: 9.2 mg/dL (ref 8.7–10.3)
Chloride: 96 mmol/L (ref 96–106)
GFR calc Af Amer: 46 mL/min/{1.73_m2} — ABNORMAL LOW (ref >59)
GFR, EST NON AFRICAN AMERICAN: 39 mL/min/{1.73_m2} — AB (ref >59)
Glucose: 129 mg/dL — ABNORMAL HIGH (ref 65–99)
Potassium: 5 mmol/L (ref 3.5–5.2)
SODIUM: 140 mmol/L (ref 134–144)

## 2016-06-09 MED ORDER — AMIODARONE HCL 200 MG PO TABS: 200.0000 mg | ORAL_TABLET | Freq: Every day | ORAL | 3 refills | Status: DC

## 2016-06-09 NOTE — Patient Instructions (Addendum)
Medication Instructions:  Your physician recommends that you continue on your current medications as directed. Please refer to the Current Medication list given to you today.  We have sent in a prescription for the Amiodarone 200 mg daily for when you need it.  Labwork: TODAY:  BMET  Testing/Procedures: None ordered  Follow-Up: Your physician recommends that you schedule a follow-up appointment in: 2 MONTHS WITH DR. ALLRED / Chanetta Marshall, NP   Any Other Special Instructions Will Be Listed Below (If Applicable).     If you need a refill on your cardiac medications before your next appointment, please call your pharmacy.

## 2016-06-13 ENCOUNTER — Telehealth: Payer: Self-pay | Admitting: *Deleted

## 2016-06-13 DIAGNOSIS — Z79899 Other long term (current) drug therapy: Secondary | ICD-10-CM

## 2016-06-13 NOTE — Telephone Encounter (Signed)
-----   Message from Eileen Stanford, PA-C sent at 06/09/2016  6:06 PM EDT ----- Creat stable. K is high normal. I would like to keep meds the same and get one more BMET next week to make sure she is not being over supplemented with potassium

## 2016-06-16 ENCOUNTER — Other Ambulatory Visit: Payer: Medicare Other | Admitting: *Deleted

## 2016-06-16 DIAGNOSIS — Z7901 Long term (current) use of anticoagulants: Secondary | ICD-10-CM | POA: Diagnosis not present

## 2016-06-16 DIAGNOSIS — I480 Paroxysmal atrial fibrillation: Secondary | ICD-10-CM | POA: Diagnosis not present

## 2016-06-16 DIAGNOSIS — Z79899 Other long term (current) drug therapy: Secondary | ICD-10-CM

## 2016-06-17 LAB — BASIC METABOLIC PANEL
BUN/Creatinine Ratio: 21 (ref 12–28)
BUN: 28 mg/dL — ABNORMAL HIGH (ref 8–27)
CO2: 28 mmol/L (ref 18–29)
CREATININE: 1.33 mg/dL — AB (ref 0.57–1.00)
Calcium: 8.8 mg/dL (ref 8.7–10.3)
Chloride: 94 mmol/L — ABNORMAL LOW (ref 96–106)
GFR calc Af Amer: 43 mL/min/{1.73_m2} — ABNORMAL LOW (ref >59)
GFR calc non Af Amer: 37 mL/min/{1.73_m2} — ABNORMAL LOW (ref >59)
GLUCOSE: 92 mg/dL (ref 65–99)
Potassium: 4.3 mmol/L (ref 3.5–5.2)
Sodium: 139 mmol/L (ref 134–144)

## 2016-06-17 NOTE — ED Notes (Signed)
Pt remained in a-fib with RVR after 3 attempts at cardioversion by EDP

## 2016-07-03 DIAGNOSIS — Z7901 Long term (current) use of anticoagulants: Secondary | ICD-10-CM | POA: Diagnosis not present

## 2016-07-04 ENCOUNTER — Ambulatory Visit (INDEPENDENT_AMBULATORY_CARE_PROVIDER_SITE_OTHER): Payer: Medicare Other | Admitting: Internal Medicine

## 2016-07-04 DIAGNOSIS — R0602 Shortness of breath: Secondary | ICD-10-CM | POA: Diagnosis not present

## 2016-07-04 LAB — PULMONARY FUNCTION TEST
DL/VA % PRED: 100 %
DL/VA: 4.29 ml/min/mmHg/L
DLCO UNC: 13.77 ml/min/mmHg
DLCO cor % pred: 73 %
DLCO cor: 14.03 ml/min/mmHg
DLCO unc % pred: 71 %
FEF 25-75 Post: 1.46 L/sec
FEF 25-75 Pre: 0.86 L/sec
FEF2575-%CHANGE-POST: 68 %
FEF2575-%PRED-POST: 142 %
FEF2575-%Pred-Pre: 84 %
FEV1-%CHANGE-POST: 11 %
FEV1-%Pred-Post: 83 %
FEV1-%Pred-Pre: 75 %
FEV1-Post: 1.23 L
FEV1-Pre: 1.11 L
FEV1FVC-%Change-Post: 4 %
FEV1FVC-%Pred-Pre: 105 %
FEV6-%Change-Post: 6 %
FEV6-%PRED-POST: 80 %
FEV6-%Pred-Pre: 76 %
FEV6-PRE: 1.43 L
FEV6-Post: 1.52 L
FEV6FVC-%CHANGE-POST: 0 %
FEV6FVC-%PRED-PRE: 107 %
FEV6FVC-%Pred-Post: 107 %
FVC-%CHANGE-POST: 6 %
FVC-%PRED-POST: 75 %
FVC-%Pred-Pre: 71 %
FVC-POST: 1.52 L
FVC-Pre: 1.44 L
POST FEV1/FVC RATIO: 81 %
PRE FEV6/FVC RATIO: 100 %
Post FEV6/FVC ratio: 100 %
Pre FEV1/FVC ratio: 77 %
RV % PRED: 101 %
RV: 2.29 L
TLC % pred: 85 %
TLC: 3.84 L

## 2016-07-04 NOTE — Progress Notes (Signed)
PFT done today. 

## 2016-07-05 ENCOUNTER — Telehealth: Payer: Self-pay | Admitting: Internal Medicine

## 2016-07-05 NOTE — Telephone Encounter (Signed)
Spoke with the pt  Her PFT has already been reviewed by cards and they have already called her  No need to fax it again  Nothing further needed

## 2016-07-07 DIAGNOSIS — Z7901 Long term (current) use of anticoagulants: Secondary | ICD-10-CM | POA: Diagnosis not present

## 2016-07-07 DIAGNOSIS — I4891 Unspecified atrial fibrillation: Secondary | ICD-10-CM | POA: Diagnosis not present

## 2016-07-21 DIAGNOSIS — Z7901 Long term (current) use of anticoagulants: Secondary | ICD-10-CM | POA: Diagnosis not present

## 2016-07-27 DIAGNOSIS — R05 Cough: Secondary | ICD-10-CM | POA: Diagnosis not present

## 2016-08-03 DIAGNOSIS — H26493 Other secondary cataract, bilateral: Secondary | ICD-10-CM | POA: Diagnosis not present

## 2016-08-03 DIAGNOSIS — H04123 Dry eye syndrome of bilateral lacrimal glands: Secondary | ICD-10-CM | POA: Diagnosis not present

## 2016-08-03 DIAGNOSIS — H524 Presbyopia: Secondary | ICD-10-CM | POA: Diagnosis not present

## 2016-08-09 ENCOUNTER — Ambulatory Visit (INDEPENDENT_AMBULATORY_CARE_PROVIDER_SITE_OTHER): Payer: Medicare Other | Admitting: Internal Medicine

## 2016-08-09 ENCOUNTER — Encounter: Payer: Self-pay | Admitting: Internal Medicine

## 2016-08-09 VITALS — BP 133/68 | HR 60 | Ht 61.0 in | Wt 212.6 lb

## 2016-08-09 DIAGNOSIS — Q268 Other congenital malformations of great veins: Secondary | ICD-10-CM | POA: Diagnosis not present

## 2016-08-09 DIAGNOSIS — R001 Bradycardia, unspecified: Secondary | ICD-10-CM | POA: Diagnosis not present

## 2016-08-09 DIAGNOSIS — I480 Paroxysmal atrial fibrillation: Secondary | ICD-10-CM

## 2016-08-09 DIAGNOSIS — I495 Sick sinus syndrome: Secondary | ICD-10-CM | POA: Diagnosis not present

## 2016-08-09 DIAGNOSIS — R0602 Shortness of breath: Secondary | ICD-10-CM | POA: Diagnosis not present

## 2016-08-09 LAB — BASIC METABOLIC PANEL
BUN / CREAT RATIO: 19 (ref 12–28)
BUN: 27 mg/dL (ref 8–27)
CO2: 27 mmol/L (ref 18–29)
CREATININE: 1.43 mg/dL — AB (ref 0.57–1.00)
Calcium: 8.5 mg/dL — ABNORMAL LOW (ref 8.7–10.3)
Chloride: 97 mmol/L (ref 96–106)
GFR calc Af Amer: 39 mL/min/{1.73_m2} — ABNORMAL LOW (ref >59)
GFR, EST NON AFRICAN AMERICAN: 34 mL/min/{1.73_m2} — AB (ref >59)
Glucose: 123 mg/dL — ABNORMAL HIGH (ref 65–99)
Potassium: 3.7 mmol/L (ref 3.5–5.2)
SODIUM: 141 mmol/L (ref 134–144)

## 2016-08-09 MED ORDER — AMIODARONE HCL 200 MG PO TABS: ORAL_TABLET | ORAL | 3 refills | Status: DC

## 2016-08-09 MED ORDER — METOPROLOL TARTRATE 50 MG PO TABS: ORAL_TABLET | ORAL | 3 refills | Status: DC

## 2016-08-09 NOTE — Patient Instructions (Addendum)
Medication Instructions:  Your physician has recommended you make the following change in your medication:  1) DECREASE Amiodarone to 1/2 tablet (100 mg) once daily 2) DECREASE Metoprolol to 1/2 tablet (25 mg) twice daily   Labwork: Your physician recommends that you return for lab work today: BMP    Testing/Procedures: Your physician has requested that you have cardiac CT. Cardiac computed tomography (CT) is a painless test that uses an x-ray machine to take clear, detailed pictures of your heart. For further information please visit [email or web address]. Please follow instruction sheet as given.     Follow-Up: Your physician wants you to follow-up in: 3 months with Dr. Rayann Heman.    Any Other Special Instructions Will Be Listed Below (If Applicable).     If you need a refill on your cardiac medications before your next appointment, please call your pharmacy.

## 2016-08-09 NOTE — Progress Notes (Signed)
PCP: [name], MD Primary Cardiologist:  Dr Tamsen Meek is a 83 y.o. female who presents today for routine electrophysiology followup. She has recently had afib (4/18).  In afib, she felt "horrible".  She was subsequently placed on amiodarone.  She has had no further afib.  She feels that her very chronic SOB has worsened with this medicine.  Recent PFTs were unrevealing though she reports that she was told that the study was technically limited because she had trouble participating in the study.  Today, she denies symptoms of palpitations, chest pain,  lower extremity edema, dizziness, presyncope, or syncope.  The patient is otherwise without complaint today.   Past Medical History:  Diagnosis Date  . Anemia   . Anginal pain ([place]) 2011   had to take nitro   . Arthritis   . COPD (chronic obstructive pulmonary disease) ([place])   . Dysuria   . GERD (gastroesophageal reflux disease)   . Hematuria   . HTN (hypertension)   . Hx of echocardiogram    Echo (12/15):  Mild LVH, EF 60-65%, mild MR, mod LAE (LA 49 mm), PASP 43 mmHg  . Obesity   . OSA (obstructive sleep apnea)    cpap at home  . PAF (paroxysmal atrial fibrillation) ([place])    s/p ablation 07/29/09  . Sick sinus syndrome ([facility])    pauses with syncope  . Stroke ([place]) 6/09   NO RESIDUAL PROBLEMS   Past Surgical History:  Procedure Laterality Date  . ABDOMINAL HYSTERECTOMY    . ATRIAL ABLATION SURGERY     s/p ablation for afib 07/29/09  . CYSTOSCOPY N/A 06/23/2015   Procedure: CYSTOSCOPY;  Surgeon: Alexis Frock, MD;  [facility];  Service: Urology;  Laterality: N/A;  . EP IMPLANTABLE DEVICE N/A 02/17/2015   MDT Advisa MRI PPM implanted by Dr Lovena Le for sick sinus and syncope  . EP IMPLANTABLE DEVICE N/A 02/17/2015   Procedure: Loop Recorder Removal;  Surgeon: Evans Lance, MD;  Location: [place] CV LAB;  Service: Cardiovascular;  Laterality: N/A;  . ESOPHAGOGASTRODUODENOSCOPY  01/24/2012   Procedure:  ESOPHAGOGASTRODUODENOSCOPY (EGD);  Surgeon: Jeryl Columbia, MD;  Location: [facility] ENDOSCOPY;  Service: Endoscopy;  Laterality: N/A;  . KNEE ARTHROSCOPY     BILATERAL  . LOOP RECORDER IMPLANT N/A 03/12/2014   Procedure: LOOP RECORDER IMPLANT;  Surgeon: Thompson Grayer, MD;  Location: [facility] CATH LAB;  Service: Cardiovascular;  Laterality: N/A;  . LYSIS OF ADHESION N/A 06/23/2015   Procedure: LYSIS OF LABIAL ADHESION;  Surgeon: Alexis Frock, MD;  [facility];  Service: Urology;  Laterality: N/A;    ROS- all systems are reviewed and negative except as per HPI above  Current Outpatient Prescriptions  Medication Sig Dispense Refill  . acetaminophen-codeine (TYLENOL #3) 300-30 MG tablet Take 1 tablet by mouth 3 (three) times daily as needed for moderate pain.     Marland Kitchen amiodarone (PACERONE) 200 MG tablet Take 1 tablet (200 mg total) by mouth daily. 90 tablet 3  . Cholecalciferol (VITAMIN D PO) Take 1 capsule by mouth every morning.     . Coenzyme Q10 (COQ10 PO) Take 1 capsule by mouth every morning.     Marland Kitchen estradiol (ESTRACE VAGINAL) 0.1 MG/GM vaginal cream Apply pea-sized amount to vagina / urethra 2-3 x weekly to prevent labial adhesions 42.5 g 12  . furosemide (LASIX) 40 MG tablet Take 80-120 mg by mouth daily.     Marland Kitchen KRILL OIL OMEGA-3 PO Take 1 capsule  by mouth twice daily.    . Magnesium 100 MG CAPS Take 1 capsule by mouth 2 (two) times daily.     . metoprolol (LOPRESSOR) 50 MG tablet Take 1 tablet (50 mg total) by mouth 2 (two) times daily. 60 tablet 0  . potassium chloride SA (K-DUR,KLOR-CON) 20 MEQ tablet Take 40 mEq by mouth 2 (two) times daily.    . traMADol (ULTRAM) 50 MG tablet Take 1-2 tablets (50-100 mg total) by mouth every 6 (six) hours as needed for moderate pain. Post-operatively 30 tablet 0  . Turmeric 500 MG CAPS Take 500 mg by mouth 2 (two) times daily.    Marland Kitchen warfarin (COUMADIN) 1 MG tablet Take 3-4 mg by mouth See admin instructions. Take 4 mg by mouth Sunday, Tuesday, Thursday and Saturday.  Take 3 mg by mouth on Monday, Wednesday and Friday.     No current facility-administered medications for this visit.     Physical Exam: Vitals:   08/09/16 1005  BP: 133/68  Pulse: 60  SpO2: 95%  Weight: 212 lb 9.6 oz (96.4 kg)  Height: 5\' 1"  (1.549 m)    GEN- The patient is overweight appearing, alert and oriented x 3 today.   Head- normocephalic, atraumatic Eyes-  Sclera clear, conjunctiva pink Ears- hearing intact Oropharynx- clear Lungs- Clear to ausculation bilaterally, normal work of breathing Chest- pacemaker pocket is well healed Heart- Regular rate and rhythm, no murmurs, rubs or gallops, PMI not laterally displaced GI- soft, NT, ND, + BS Extremities- no clubbing, cyanosis, or edema  Pacemaker interrogation- reviewed in detail today,  See PACEART report ekg traving from today is reviewed and reveals atrial paced rhythm 60 bpm, PR 258 msec, QTC 104 msec, Qtc 478 msec, poor R wave transision PFTs reviewed Recent echo reviewed Ms Lorn Junes, and Dr Cherlyn [place] notes are reviewed  Assessment and Plan:  1. Sinus bradycardia Normal pacemaker function See Pace Art report No changes today Her SOB preceeds PPM implant  2. afib Maintaining sinus rhythm currently Reduce amiodarone to 100mg  daily due to SOB Continue life long anticoagulation  3. OSA CPAP  4. SOB Unclear etiology I have discussed at length by phone with Dr Irish Lack today Will obtain cardiac CT to evaluate for pulmonary vein stenosis.  If CT is unrevealing, would refer to pulmonary for further assessment.  As findings are chronic and prior cath did not reveal CAD, Dr Irish Lack does not feel that cath is indicated currently.  Reduce metoprolol to 25mg  BID and reduce amiodarone to 100mg  daily Hopefully we can wean Amiodarone off in the future  Consider CPX testing if SOB cause is not found  5. Obesity Body mass index is 40.17 kg/m. She has not had much success with lifestyle modification  including weight reduction and exercise despite many conversations over the years.   Very complicated complicated patient.  A high level of decision making was required for this encounter.  Follow-up with Dr Irish Lack for cardiology management as scheduled in the next few weeks I will see her again in 3 months  Thompson Grayer MD, [facility] 08/09/2016 10:55 AM

## 2016-08-11 LAB — CUP PACEART INCLINIC DEVICE CHECK
Battery Remaining Longevity: 106 mo
Battery Voltage: 3.02 V
Brady Statistic AP VS Percent: 52.49 %
Brady Statistic RA Percent Paced: 51.84 %
Brady Statistic RV Percent Paced: 0.09 %
Implantable Lead Implant Date: 20161214
Implantable Lead Location: 753859
Implantable Lead Model: 5076
Implantable Lead Model: 5076
Implantable Pulse Generator Implant Date: 20161214
Lead Channel Impedance Value: 361 Ohm
Lead Channel Impedance Value: 494 Ohm
Lead Channel Pacing Threshold Amplitude: 1 V
Lead Channel Pacing Threshold Pulse Width: 0.4 ms
Lead Channel Sensing Intrinsic Amplitude: 19.25 mV
Lead Channel Sensing Intrinsic Amplitude: 3.25 mV
Lead Channel Setting Pacing Amplitude: 2.5 V
MDC IDC LEAD IMPLANT DT: 20161214
MDC IDC LEAD LOCATION: 753860
MDC IDC MSMT LEADCHNL RV IMPEDANCE VALUE: 513 Ohm
MDC IDC MSMT LEADCHNL RV IMPEDANCE VALUE: 570 Ohm
MDC IDC MSMT LEADCHNL RV PACING THRESHOLD AMPLITUDE: 1 V
MDC IDC MSMT LEADCHNL RV PACING THRESHOLD PULSEWIDTH: 0.4 ms
MDC IDC SESS DTM: 20180606141427
MDC IDC SET LEADCHNL RV PACING AMPLITUDE: 2.5 V
MDC IDC SET LEADCHNL RV PACING PULSEWIDTH: 0.4 ms
MDC IDC SET LEADCHNL RV SENSING SENSITIVITY: 2.8 mV
MDC IDC STAT BRADY AP VP PERCENT: 0.03 %
MDC IDC STAT BRADY AS VP PERCENT: 0.06 %
MDC IDC STAT BRADY AS VS PERCENT: 47.42 %

## 2016-08-16 ENCOUNTER — Telehealth: Payer: Self-pay | Admitting: Internal Medicine

## 2016-08-16 NOTE — Telephone Encounter (Signed)
New message   Pt states someone was supposed to call her and schedule a CT scan requests call back

## 2016-08-17 NOTE — Telephone Encounter (Signed)
F/U call:  Patient states that she would like a call in regards to her CT scan.

## 2016-08-17 NOTE — Telephone Encounter (Signed)
Lynn Dunn is going to call the patient and let her know that we are waiting on MD to review and insurance to approve

## 2016-08-18 DIAGNOSIS — Z7901 Long term (current) use of anticoagulants: Secondary | ICD-10-CM | POA: Diagnosis not present

## 2016-08-18 DIAGNOSIS — I480 Paroxysmal atrial fibrillation: Secondary | ICD-10-CM | POA: Diagnosis not present

## 2016-08-28 ENCOUNTER — Encounter: Payer: Self-pay | Admitting: Internal Medicine

## 2016-08-28 ENCOUNTER — Telehealth: Payer: Self-pay | Admitting: Cardiology

## 2016-08-28 ENCOUNTER — Ambulatory Visit (INDEPENDENT_AMBULATORY_CARE_PROVIDER_SITE_OTHER): Payer: Medicare Other | Admitting: *Deleted

## 2016-08-28 DIAGNOSIS — I495 Sick sinus syndrome: Secondary | ICD-10-CM

## 2016-08-28 NOTE — Telephone Encounter (Signed)
LMOVM reminding pt to send remote transmission.   

## 2016-08-28 NOTE — Progress Notes (Signed)
Remote pacemaker transmission.   

## 2016-08-30 ENCOUNTER — Encounter: Payer: Self-pay | Admitting: Cardiology

## 2016-08-30 LAB — CUP PACEART REMOTE DEVICE CHECK
Battery Remaining Longevity: 109 mo
Battery Voltage: 3.02 V
Brady Statistic AP VP Percent: 0.03 %
Brady Statistic AS VS Percent: 50.89 %
Brady Statistic RA Percent Paced: 49.07 %
Implantable Lead Implant Date: 20161214
Implantable Lead Implant Date: 20161214
Implantable Lead Location: 753860
Implantable Lead Model: 5076
Implantable Pulse Generator Implant Date: 20161214
Lead Channel Impedance Value: 380 Ohm
Lead Channel Impedance Value: 513 Ohm
Lead Channel Impedance Value: 551 Ohm
Lead Channel Pacing Threshold Amplitude: 1 V
Lead Channel Pacing Threshold Amplitude: 1 V
Lead Channel Pacing Threshold Pulse Width: 0.4 ms
Lead Channel Pacing Threshold Pulse Width: 0.4 ms
Lead Channel Sensing Intrinsic Amplitude: 15.875 mV
Lead Channel Sensing Intrinsic Amplitude: 3.25 mV
Lead Channel Setting Pacing Amplitude: 2.25 V
Lead Channel Setting Pacing Pulse Width: 0.4 ms
MDC IDC LEAD LOCATION: 753859
MDC IDC MSMT LEADCHNL RA SENSING INTR AMPL: 3.25 mV
MDC IDC MSMT LEADCHNL RV IMPEDANCE VALUE: 665 Ohm
MDC IDC MSMT LEADCHNL RV SENSING INTR AMPL: 15.875 mV
MDC IDC SESS DTM: 20180625155042
MDC IDC SET LEADCHNL RV PACING AMPLITUDE: 2.5 V
MDC IDC SET LEADCHNL RV SENSING SENSITIVITY: 2.8 mV
MDC IDC STAT BRADY AP VS PERCENT: 49.04 %
MDC IDC STAT BRADY AS VP PERCENT: 0.04 %
MDC IDC STAT BRADY RV PERCENT PACED: 0.06 %

## 2016-08-31 ENCOUNTER — Ambulatory Visit (HOSPITAL_COMMUNITY): Admission: RE | Admit: 2016-08-31 | Payer: Medicare Other | Source: Ambulatory Visit

## 2016-08-31 DIAGNOSIS — Z7901 Long term (current) use of anticoagulants: Secondary | ICD-10-CM | POA: Diagnosis not present

## 2016-08-31 DIAGNOSIS — I4891 Unspecified atrial fibrillation: Secondary | ICD-10-CM | POA: Diagnosis not present

## 2016-08-31 NOTE — Progress Notes (Signed)
Cardiology Office Note   Date:  09/01/2016   ID:  Lynn, Dunn 14-May-1930, MRN [id number]  PCP:  [name], MD    No chief complaint on file.  AFib  Wt Readings from Last 3 Encounters:  09/01/16 209 lb 12.8 oz (95.2 kg)  08/09/16 212 lb 9.6 oz (96.4 kg)  06/09/16 211 lb 12.8 oz (96.1 kg)       History of Present Illness: Lynn Dunn is a 83 y.o. female  Who has had AFib and CVA in the past, treated with TPA. She has had atrial fibrillation ablation in the past.   She had a loop monitor in place due to syncope/near syncope. She had a pacer placed.   She had knee surgery in June 13, 2014.   Unfortunately, her husband passed away in Jun 13, 2015 from aortic stenosis.   She has had chronic shortness of breath and was seen in 6/18 by Dr. Rayann Heman. His plan was : "Unclear etiology (of SHOB) I have discussed at length by phone with Dr Irish Lack today Lynn obtain cardiac CT to evaluate for pulmonary vein stenosis.  If CT is unrevealing, would refer to pulmonary for further assessment.  As findings are chronic and prior cath did not reveal CAD, Dr Irish Lack does not feel that cath is indicated currently.  Reduce metoprolol to 25mg  BID and reduce amiodarone to 100mg  daily Hopefully we can wean Amiodarone off in the future  Consider CPX testing if SOB cause is not found.  She continues to be short of breath and her feet hurt.  She feels the [facility] and foot pain is worse even since decreasing the amiodarone.  She is scheduled for CT scan today.  Denies : Chest pain. Dizziness. Leg edema. Nitroglycerin use. Orthopnea. Palpitations. Paroxysmal nocturnal dyspnea. Syncope.   She has SHOB throughout the day, wuth even minimal exertion.  She gets hand cramping when she skips potassium.  She moved to a retirement community last year and does a low impact exercise class there.  She has some DOE with that.   She had AFib earlier this year and she is very symptomatic with the arryhthmia.        Past Medical History:  Diagnosis Date  . Anemia   . Anginal pain ([place]) 06/12/09   had to take nitro   . Arthritis   . COPD (chronic obstructive pulmonary disease) ([place])   . Dysuria   . GERD (gastroesophageal reflux disease)   . Hematuria   . HTN (hypertension)   . Hx of echocardiogram    Echo (12/15):  Mild LVH, EF 60-65%, mild MR, mod LAE (LA 49 mm), PASP 43 mmHg  . Obesity   . OSA (obstructive sleep apnea)    cpap at home  . PAF (paroxysmal atrial fibrillation) ([place])    s/p ablation 07/29/09  . Sick sinus syndrome ([facility])    pauses with syncope  . Stroke ([place]) 6/09   NO RESIDUAL PROBLEMS    Past Surgical History:  Procedure Laterality Date  . ABDOMINAL HYSTERECTOMY    . ATRIAL ABLATION SURGERY     s/p ablation for afib 07/29/09  . CYSTOSCOPY N/A 06/23/2015   Procedure: CYSTOSCOPY;  Surgeon: Alexis Frock, MD;  Location: [facility];  Service: Urology;  Laterality: N/A;  . EP IMPLANTABLE DEVICE N/A 02/17/2015   MDT Advisa MRI PPM implanted by Dr Lovena Le for sick sinus and syncope  . EP IMPLANTABLE DEVICE N/A 02/17/2015   Procedure: Loop Recorder Removal;  Surgeon: Carleene Overlie  Peyton Najjar, MD;  Location: [place] CV LAB;  Service: Cardiovascular;  Laterality: N/A;  . ESOPHAGOGASTRODUODENOSCOPY  01/24/2012   Procedure: ESOPHAGOGASTRODUODENOSCOPY (EGD);  Surgeon: Jeryl Columbia, MD;  Location: [facility] ENDOSCOPY;  Service: Endoscopy;  Laterality: N/A;  . KNEE ARTHROSCOPY     BILATERAL  . LOOP RECORDER IMPLANT N/A 03/12/2014   Procedure: LOOP RECORDER IMPLANT;  Surgeon: Thompson Grayer, MD;  Location: [facility] CATH LAB;  Service: Cardiovascular;  Laterality: N/A;  . LYSIS OF ADHESION N/A 06/23/2015   Procedure: LYSIS OF LABIAL ADHESION;  Surgeon: Alexis Frock, MD;  Location: [facility];  Service: Urology;  Laterality: N/A;     Current Outpatient Prescriptions  Medication Sig Dispense Refill  . acetaminophen-codeine (TYLENOL #3) 300-30 MG tablet Take 1 tablet by mouth 3 (three) times daily as needed for  moderate pain.     Marland Kitchen amiodarone (PACERONE) 200 MG tablet Take 1/2 tablet (100 mg) by mouth once daily 90 tablet 3  . Cholecalciferol (VITAMIN D PO) Take 1 capsule by mouth every morning.     . Coenzyme Q10 (COQ10 PO) Take 1 capsule by mouth every morning.     Marland Kitchen estradiol (ESTRACE VAGINAL) 0.1 MG/GM vaginal cream Apply pea-sized amount to vagina / urethra 2-3 x weekly to prevent labial adhesions 42.5 g 12  . furosemide (LASIX) 40 MG tablet Take 120 mg by mouth daily.    Marland Kitchen KRILL OIL OMEGA-3 PO Take 1 capsule by mouth twice daily.    . Magnesium 100 MG CAPS Take 1 capsule by mouth 2 (two) times daily.     . metoprolol tartrate (LOPRESSOR) 50 MG tablet Take 1/2 tablet (25 mg) by mouth twice a day 180 tablet 3  . potassium chloride SA (K-DUR,KLOR-CON) 20 MEQ tablet Take 40 mEq by mouth 2 (two) times daily.    . traMADol (ULTRAM) 50 MG tablet Take 1-2 tablets (50-100 mg total) by mouth every 6 (six) hours as needed for moderate pain. Post-operatively 30 tablet 0  . Turmeric 500 MG CAPS Take 500 mg by mouth 2 (two) times daily.    Marland Kitchen warfarin (COUMADIN) 1 MG tablet Take 3-4 mg by mouth See admin instructions. Take 4 mg by mouth Sunday, Tuesday, Thursday and Saturday. Take 3 mg by mouth on Monday, Wednesday and Friday.     No current facility-administered medications for this visit.     Allergies:   Ace inhibitors; Albuterol; Albuterol sulfate; and Sulfa antibiotics    Social History:  The patient  reports that she has never smoked. She has never used smokeless tobacco. She reports that she does not drink alcohol or use drugs.   Family History:  The patient's family history includes Brain cancer in her father; Breast cancer in her mother; Hypertension in her sister; Stroke in her maternal aunt.    ROS:  Please see the history of present illness.   Otherwise, review of systems are positive for foot pain, worse with walking- but once she gets going improves.  She has chronic right knee pain and is  avoiding TKR.   All other systems are reviewed and negative.    PHYSICAL EXAM: VS:  BP 130/62   Pulse 64   Ht 4' 11.5" (1.511 m)   Wt 209 lb 12.8 oz (95.2 kg)   BMI 41.67 kg/m  , BMI Body mass index is 41.67 kg/m. GEN: Well nourished, well developed, in no acute distress  HEENT: normal  Neck: no JVD, carotid bruits, or masses Cardiac: RRR; no murmurs, rubs,  or gallops,no edema  Respiratory:  clear to auscultation bilaterally, normal work of breathing GI: soft, nontender, nondistended, + BS MS: no deformity or atrophy  Skin: warm and dry, no rash Neuro:  Strength and sensation are intact Psych: euthymic mood, full affect    Recent Labs: 11/25/2015: Brain Natriuretic Peptide 99.5 04/05/2016: TSH 2.254 06/05/2016: Hemoglobin 13.0; Magnesium 2.3; Platelets 259 08/09/2016: BUN 27; Creatinine, Ser 1.43; Potassium 3.7; Sodium 141   Lipid Panel    Component Value Date/Time   CHOL 170 08/31/2014 0941   TRIG 162.0 (H) 08/31/2014 0941   HDL 51.80 08/31/2014 0941   CHOLHDL 3 08/31/2014 0941   VLDL 32.4 08/31/2014 0941   LDLCALC 86 08/31/2014 0941     Other studies Reviewed: Additional studies/ records that were reviewed today with results demonstrating: CT of heart pending- to r/o pulmonary vein stenosis.  Hospital records from Jan 2018 reviewed.   ASSESSMENT AND PLAN:  1. PAF: Maintaining sinus rhythm currently on low-dose amiodarone and low-dose beta blocker. Her symptoms have not improved despite decreasing dosages of medications. She does need to maintain sinus rhythm due to the severe symptoms she has when she goes into atrial fibrillation. 2. Hyperlipidemia: LDL 71 in December 2017. 3. Shortness of breath: CT scan appointment was actually yesterday but she unfortunately thought it was today. CT scan Lynn be rescheduled.  If no pulmonary vein stenosis, refer to pulmonary.  I suspect that deconditioning is playing a big role in her shortness of breath. Her activity is limited by  joint problems. 4. Carotid artery disease: Moderate RICA disease noted in 10/17.   Current medicines are reviewed at length with the patient today.  The patient concerns regarding her medicines were addressed.  The following changes have been made:  No change  Labs/ tests ordered today include:  No orders of the defined types were placed in this encounter.   Recommend 150 minutes/week of aerobic exercise Low fat, low carb, high fiber diet recommended  Disposition:   FU in based on CT results   Signed, Larae Grooms, MD  09/01/2016 9:08 AM    [facility] [place], [place], [place]  [address] Phone: [phone]; Fax: [phone]

## 2016-09-01 ENCOUNTER — Encounter: Payer: Self-pay | Admitting: Interventional Cardiology

## 2016-09-01 ENCOUNTER — Ambulatory Visit (INDEPENDENT_AMBULATORY_CARE_PROVIDER_SITE_OTHER): Payer: Medicare Other | Admitting: Interventional Cardiology

## 2016-09-01 ENCOUNTER — Encounter: Payer: Self-pay | Admitting: Internal Medicine

## 2016-09-01 VITALS — BP 130/62 | HR 64 | Ht 59.5 in | Wt 209.8 lb

## 2016-09-01 DIAGNOSIS — I739 Peripheral vascular disease, unspecified: Secondary | ICD-10-CM

## 2016-09-01 DIAGNOSIS — I480 Paroxysmal atrial fibrillation: Secondary | ICD-10-CM

## 2016-09-01 DIAGNOSIS — I779 Disorder of arteries and arterioles, unspecified: Secondary | ICD-10-CM | POA: Diagnosis not present

## 2016-09-01 DIAGNOSIS — R0602 Shortness of breath: Secondary | ICD-10-CM

## 2016-09-01 LAB — BASIC METABOLIC PANEL
BUN / CREAT RATIO: 24 (ref 12–28)
BUN: 35 mg/dL — ABNORMAL HIGH (ref 8–27)
CO2: 26 mmol/L (ref 20–29)
Calcium: 9 mg/dL (ref 8.7–10.3)
Chloride: 99 mmol/L (ref 96–106)
Creatinine, Ser: 1.45 mg/dL — ABNORMAL HIGH (ref 0.57–1.00)
GFR calc Af Amer: 38 mL/min/{1.73_m2} — ABNORMAL LOW (ref >59)
GFR, EST NON AFRICAN AMERICAN: 33 mL/min/{1.73_m2} — AB (ref >59)
GLUCOSE: 122 mg/dL — AB (ref 65–99)
POTASSIUM: 4.4 mmol/L (ref 3.5–5.2)
SODIUM: 141 mmol/L (ref 134–144)

## 2016-09-01 NOTE — Patient Instructions (Signed)
Medication Instructions:  Your physician recommends that you continue on your current medications as directed. Please refer to the Current Medication list given to you today.   Labwork: BMET TODAY  Testing/Procedures: Your physician recommends that you have a Cardiac CT with FFR (next available).  Follow-Up: Follow woth Dr. Irish Lack will be based on the results of the CT   Any Other Special Instructions Will Be Listed Below (If Applicable).     If you need a refill on your cardiac medications before your next appointment, please call your pharmacy.

## 2016-09-07 DIAGNOSIS — I480 Paroxysmal atrial fibrillation: Secondary | ICD-10-CM | POA: Diagnosis not present

## 2016-09-07 DIAGNOSIS — Z7901 Long term (current) use of anticoagulants: Secondary | ICD-10-CM | POA: Diagnosis not present

## 2016-09-12 DIAGNOSIS — E1122 Type 2 diabetes mellitus with diabetic chronic kidney disease: Secondary | ICD-10-CM | POA: Diagnosis not present

## 2016-09-12 DIAGNOSIS — Z23 Encounter for immunization: Secondary | ICD-10-CM | POA: Diagnosis not present

## 2016-09-12 DIAGNOSIS — E669 Obesity, unspecified: Secondary | ICD-10-CM | POA: Diagnosis not present

## 2016-09-12 DIAGNOSIS — I10 Essential (primary) hypertension: Secondary | ICD-10-CM | POA: Diagnosis not present

## 2016-09-12 DIAGNOSIS — E785 Hyperlipidemia, unspecified: Secondary | ICD-10-CM | POA: Diagnosis not present

## 2016-09-12 DIAGNOSIS — Z6837 Body mass index (BMI) 37.0-37.9, adult: Secondary | ICD-10-CM | POA: Diagnosis not present

## 2016-09-12 DIAGNOSIS — Z0000 Encounter for general adult medical examination without abnormal findings: Secondary | ICD-10-CM | POA: Diagnosis not present

## 2016-09-12 DIAGNOSIS — N183 Chronic kidney disease, stage 3 (moderate): Secondary | ICD-10-CM | POA: Diagnosis not present

## 2016-09-12 DIAGNOSIS — R05 Cough: Secondary | ICD-10-CM | POA: Diagnosis not present

## 2016-09-12 DIAGNOSIS — Z7901 Long term (current) use of anticoagulants: Secondary | ICD-10-CM | POA: Diagnosis not present

## 2016-09-13 ENCOUNTER — Encounter: Payer: Self-pay | Admitting: Cardiology

## 2016-09-20 ENCOUNTER — Ambulatory Visit (HOSPITAL_COMMUNITY)
Admission: RE | Admit: 2016-09-20 | Discharge: 2016-09-20 | Disposition: A | Discharge status: Home / Self Care | Payer: Medicare Other | Source: Ambulatory Visit | Attending: Interventional Cardiology | Admitting: Interventional Cardiology

## 2016-09-20 ENCOUNTER — Encounter (HOSPITAL_COMMUNITY): Payer: Self-pay

## 2016-09-20 DIAGNOSIS — R0602 Shortness of breath: Secondary | ICD-10-CM | POA: Insufficient documentation

## 2016-09-20 DIAGNOSIS — I700 Atherosclerosis of aorta: Secondary | ICD-10-CM | POA: Insufficient documentation

## 2016-09-20 DIAGNOSIS — I2510 Atherosclerotic heart disease of native coronary artery without angina pectoris: Secondary | ICD-10-CM | POA: Insufficient documentation

## 2016-09-20 MED ORDER — NITROGLYCERIN 0.4 MG SL SUBL
0.8000 mg | SUBLINGUAL_TABLET | Freq: Once | SUBLINGUAL | Status: AC
  Administered 2016-09-20: 0.8 mg via SUBLINGUAL
  Filled 2016-09-20: qty 25

## 2016-09-20 MED ORDER — NITROGLYCERIN 0.4 MG SL SUBL
SUBLINGUAL_TABLET | SUBLINGUAL | Status: AC
  Administered 2016-09-20: 0.8 mg via SUBLINGUAL
  Filled 2016-09-20: qty 2

## 2016-09-20 MED ORDER — IOPAMIDOL (ISOVUE-370) INJECTION 76%
INTRAVENOUS | Status: AC
  Filled 2016-09-20: qty 100

## 2016-09-20 NOTE — Progress Notes (Signed)
Tolerated CT ate crackers and drank water without incident. Ambulatory steady gait.

## 2016-09-24 ENCOUNTER — Ambulatory Visit (HOSPITAL_COMMUNITY)
Admission: RE | Admit: 2016-09-24 | Discharge: 2016-09-24 | Disposition: A | Discharge status: Home / Self Care | Payer: Medicare Other | Source: Ambulatory Visit | Attending: Interventional Cardiology | Admitting: Interventional Cardiology

## 2016-09-24 DIAGNOSIS — R0602 Shortness of breath: Secondary | ICD-10-CM | POA: Insufficient documentation

## 2016-09-24 DIAGNOSIS — I700 Atherosclerosis of aorta: Secondary | ICD-10-CM | POA: Diagnosis not present

## 2016-09-24 DIAGNOSIS — I281 Aneurysm of pulmonary artery: Secondary | ICD-10-CM | POA: Diagnosis not present

## 2016-09-24 DIAGNOSIS — I2510 Atherosclerotic heart disease of native coronary artery without angina pectoris: Secondary | ICD-10-CM | POA: Insufficient documentation

## 2016-09-27 ENCOUNTER — Emergency Department (HOSPITAL_BASED_OUTPATIENT_CLINIC_OR_DEPARTMENT_OTHER)
Admission: EM | Admit: 2016-09-27 | Discharge: 2016-09-27 | Disposition: A | Discharge status: Home / Self Care | Payer: Medicare Other | Attending: Emergency Medicine | Admitting: Emergency Medicine

## 2016-09-27 ENCOUNTER — Emergency Department (HOSPITAL_BASED_OUTPATIENT_CLINIC_OR_DEPARTMENT_OTHER): Payer: Medicare Other

## 2016-09-27 ENCOUNTER — Encounter (HOSPITAL_BASED_OUTPATIENT_CLINIC_OR_DEPARTMENT_OTHER): Payer: Self-pay | Admitting: *Deleted

## 2016-09-27 DIAGNOSIS — R52 Pain, unspecified: Secondary | ICD-10-CM | POA: Diagnosis not present

## 2016-09-27 DIAGNOSIS — Z7901 Long term (current) use of anticoagulants: Secondary | ICD-10-CM | POA: Insufficient documentation

## 2016-09-27 DIAGNOSIS — J449 Chronic obstructive pulmonary disease, unspecified: Secondary | ICD-10-CM | POA: Insufficient documentation

## 2016-09-27 DIAGNOSIS — Z79899 Other long term (current) drug therapy: Secondary | ICD-10-CM | POA: Insufficient documentation

## 2016-09-27 DIAGNOSIS — M79671 Pain in right foot: Secondary | ICD-10-CM | POA: Insufficient documentation

## 2016-09-27 DIAGNOSIS — I10 Essential (primary) hypertension: Secondary | ICD-10-CM | POA: Insufficient documentation

## 2016-09-27 MED ORDER — TRAMADOL HCL 50 MG PO TABS: 50.0000 mg | ORAL_TABLET | Freq: Two times a day (BID) | ORAL | 0 refills | Status: DC | PRN

## 2016-09-27 MED ORDER — TRAMADOL HCL 50 MG PO TABS
50.0000 mg | ORAL_TABLET | Freq: Once | ORAL | Status: AC
  Administered 2016-09-27: 50 mg via ORAL
  Filled 2016-09-27: qty 1

## 2016-09-27 NOTE — ED Notes (Signed)
[facility] called for pick up

## 2016-09-27 NOTE — ED Provider Notes (Signed)
[facility] DEPT [facility] Provider Note   CSN: [id number] Arrival date & time: 09/27/16  1710   By signing my name below, I, [name], attest that this documentation has been prepared under the direction and in the presence of Quintella Reichert, MD. Electronically signed, [name], ED Scribe. 09/27/16. 5:38 PM.   History   Chief Complaint Chief Complaint  Patient presents with  . Foot Pain   The history is provided by the patient and medical records. No language interpreter was used.    Lynn Dunn is a 83 y.o. female with h/o stoke, HTN, COPD and angina presenting to the Emergency Department concerning R foot pain onset earlier today after getting out of the car and taking 3 steps. She describes constant, severe, aching that prevents her from placing weight on the heel and mid foot. Pt states this is pain that had been mild for the last 2-3 days but she was able to walk on it prior to today. H/o pacemaker and AFIB reported. She states she takes warfarin; her last levels were slightly high when measured last week. No recent medications adjustments. She denies easy bleeding/ bruising. She states she uses a walker at baseline and this has not allowed her to place weight on the affected foot. No h/o gout or DM. No known injury or trauma sensations. No h/o similar symptoms noted. No fever, chest pain or SOB significant from baseline.  Past Medical History:  Diagnosis Date  . Anemia   . Anginal pain ([place]) 2011   had to take nitro   . Arthritis   . COPD (chronic obstructive pulmonary disease) ([place])   . Dysuria   . GERD (gastroesophageal reflux disease)   . Hematuria   . HTN (hypertension)   . Hx of echocardiogram    Echo (12/15):  Mild LVH, EF 60-65%, mild MR, mod LAE (LA 49 mm), PASP 43 mmHg  . Obesity   . OSA (obstructive sleep apnea)    cpap at home  . PAF (paroxysmal atrial fibrillation) ([place])    s/p ablation 07/29/09  . Sick sinus syndrome ([facility])    pauses with syncope    . Stroke ([place]) 6/09   NO RESIDUAL PROBLEMS    Patient Active Problem List   Diagnosis Date Noted  . COPD exacerbation ([place]) 04/05/2016  . Subtherapeutic international normalized ratio (INR) 04/05/2016  . Atrial fibrillation with rapid ventricular response ([place]) 04/04/2016  . Carotid artery disease ([place]) 11/25/2015  . Pre-syncope 02/16/2015  . Sick sinus syndrome ([place]) 02/15/2015  . Swelling of right lower extremity 08/31/2014  . Mixed hyperlipidemia 12/27/2012  . Obesity 12/27/2012  . GI bleed 01/23/2012  . Anemia 01/23/2012  . History of CVA (cerebrovascular accident) 01/23/2012  . SHORTNESS OF BREATH ([place]) 09/13/2009  . Essential hypertension, benign 06/17/2009  . Atrial fibrillation ([place]) 06/17/2009  . OSA (obstructive sleep apnea) 06/17/2009    Past Surgical History:  Procedure Laterality Date  . ABDOMINAL HYSTERECTOMY    . ATRIAL ABLATION SURGERY     s/p ablation for afib 07/29/09  . CYSTOSCOPY N/A 06/23/2015   Procedure: CYSTOSCOPY;  Surgeon: Alexis Frock, MD;  [facility]: [facility];  Service: Urology;  Laterality: N/A;  . EP IMPLANTABLE DEVICE N/A 02/17/2015   MDT Advisa MRI PPM implanted by Dr Lovena Le for sick sinus and syncope  . EP IMPLANTABLE DEVICE N/A 02/17/2015   Procedure: Loop Recorder Removal;  Surgeon: Evans Lance, MD;  Location: [facility] CV LAB;  Service: Cardiovascular;  Laterality: N/A;  .  ESOPHAGOGASTRODUODENOSCOPY  01/24/2012   Procedure: ESOPHAGOGASTRODUODENOSCOPY (EGD);  Surgeon: Jeryl Columbia, MD;  Location: [facility] ENDOSCOPY;  Service: Endoscopy;  Laterality: N/A;  . KNEE ARTHROSCOPY     BILATERAL  . LOOP RECORDER IMPLANT N/A 03/12/2014   Procedure: LOOP RECORDER IMPLANT;  Surgeon: Thompson Grayer, MD;  Location: [facility] CATH LAB;  Service: Cardiovascular;  Laterality: N/A;  . LYSIS OF ADHESION N/A 06/23/2015   Procedure: LYSIS OF LABIAL ADHESION;  Surgeon: Alexis Frock, MD;  [facility]: [facility];  Service: Urology;  Laterality: N/A;    OB History    No data  available       Home Medications    Prior to Admission medications   Medication Sig Start Date End Date Taking? Authorizing Provider  acetaminophen-codeine (TYLENOL #3) 300-30 MG tablet Take 1 tablet by mouth 3 (three) times daily as needed for moderate pain.  04/03/16   [provider]  amiodarone (PACERONE) 200 MG tablet Take 1/2 tablet (100 mg) by mouth once daily 08/09/16   Allred, Jeneen Rinks, MD  Cholecalciferol (VITAMIN D PO) Take 1 capsule by mouth every morning.     [provider]  Coenzyme Q10 (COQ10 PO) Take 1 capsule by mouth every morning.     [provider]  estradiol (ESTRACE VAGINAL) 0.1 MG/GM vaginal cream Apply pea-sized amount to vagina / urethra 2-3 x weekly to prevent labial adhesions 06/23/15   Alexis Frock, MD  furosemide (LASIX) 40 MG tablet Take 120 mg by mouth daily.    [provider]  KRILL OIL OMEGA-3 PO Take 1 capsule by mouth twice daily.    [provider]  Magnesium 100 MG CAPS Take 1 capsule by mouth 2 (two) times daily.     [provider]  metoprolol tartrate (LOPRESSOR) 50 MG tablet Take 1/2 tablet (25 mg) by mouth twice a day 08/09/16   Allred, Jeneen Rinks, MD  potassium chloride SA (K-DUR,KLOR-CON) 20 MEQ tablet Take 40 mEq by mouth 2 (two) times daily.    [provider]  traMADol (ULTRAM) 50 MG tablet Take 1 tablet (50 mg total) by mouth every 12 (twelve) hours as needed. 09/27/16   Quintella Reichert, MD  Turmeric 500 MG CAPS Take 500 mg by mouth 2 (two) times daily.    [provider]  warfarin (COUMADIN) 1 MG tablet Take 3-4 mg by mouth See admin instructions. Take 4 mg by mouth Sunday, Tuesday, Thursday and Saturday. Take 3 mg by mouth on Monday, Wednesday and Friday.    [provider]    Family History Family History  Problem Relation Age of Onset  . Breast cancer Mother   . Brain cancer Father   . Breast cancer Unknown   . Brain cancer Unknown   . Stroke Maternal Aunt   .  Hypertension Sister   . Heart attack Neg Hx     Social History Social History  Substance Use Topics  . Smoking status: Never Smoker  . Smokeless tobacco: Never Used  . Alcohol use No     Allergies   Ace inhibitors; Albuterol; Albuterol sulfate; and Sulfa antibiotics   Review of Systems Review of Systems  Constitutional: Negative for fever.  Respiratory: Negative for shortness of breath.   Cardiovascular: Negative for chest pain.  Musculoskeletal: Positive for arthralgias and gait problem. Negative for back pain and joint swelling.  Skin: Negative for wound.  Neurological: Negative for dizziness, weakness and numbness.  All other systems reviewed and are negative.    Physical Exam  Updated Vital Signs BP (!) 159/68   Pulse 77   Temp 98.2 F (36.8 C) (Oral)   Resp 18   Ht 5' (1.524 m)   Wt 205 lb (93 kg)   SpO2 96%   BMI 40.04 kg/m   Physical Exam  Constitutional: She is oriented to person, place, and time. She appears well-developed and well-nourished.  HENT:  Head: Normocephalic and atraumatic.  Cardiovascular: Normal rate.   No murmur heard. Pulmonary/Chest: Effort normal and breath sounds normal. No respiratory distress.  Abdominal: Soft. There is no tenderness. There is no rebound and no guarding.  Musculoskeletal: She exhibits no tenderness.  2+ DP pulses bilaterally. Nonpitting edema to bilateral lower extremities. Moderate tenderness to palpation over the right, lateral midfoot. No erythema to the foot. Flexion and extension are intact at the ankle.  Neurological: She is alert and oriented to person, place, and time.  Skin: Skin is warm and dry.  Psychiatric: She has a normal mood and affect. Her behavior is normal.  Nursing note and vitals reviewed.    ED Treatments / Results  DIAGNOSTIC STUDIES: Oxygen Saturation is 96% on RA, NL by my interpretation.    COORDINATION OF CARE: 5:28 PM-Discussed next steps with pt. Pt verbalized understanding and  is agreeable with the plan. Will order Xr.   Labs (all labs ordered are listed, but only abnormal results are displayed) Labs Reviewed - No data to display  EKG  EKG Interpretation None       Radiology Dg Foot Complete Right  Result Date: 09/27/2016 CLINICAL DATA:  83 year old female with right foot pain in lateral aspect of the foot. No known injury. EXAM: RIGHT FOOT COMPLETE - 3+ VIEW COMPARISON:  None. FINDINGS: There is no evidence of fracture or dislocation. There is no evidence of arthropathy or other focal bone abnormality. Soft tissues are unremarkable. Plantar calcaneal spur noted incidentally. IMPRESSION: Negative. Electronically Signed   By: Jacqulynn Cadet M.D.   On: 09/27/2016 18:20    Procedures Procedures (including critical care time)  Medications Ordered in ED Medications  traMADol (ULTRAM) tablet 50 mg (50 mg Oral Given 09/27/16 1739)     Initial Impression / Assessment and Plan / ED Course  I have reviewed the triage vital signs and the nursing notes.  Pertinent labs & imaging results that were available during my care of the patient were reviewed by me and considered in my medical decision making (see chart for details).     Patient here for evaluation of right foot pain. She does have some mild aching for a few days before developing acute worsening of her pain after ambulating. She is neurovascularly intact on examination with no evidence of acute infectious process. No evidence of ligamentous injury on examination and plain films are negative for acute fracture. Plan to place in postop shoe for comfort with weightbearing as tolerated. Her pain is improved after tramadol in the ED. Plan to DC home with close outpatient follow-up and return precautions.  Final Clinical Impressions(s) / ED Diagnoses   Final diagnoses:  Foot pain, right    New Prescriptions New Prescriptions   TRAMADOL (ULTRAM) 50 MG TABLET    Take 1 tablet (50 mg total) by mouth every  12 (twelve) hours as needed.  I personally performed the services described in this documentation, which was scribed in my presence. The recorded information has been reviewed and is accurate.    Quintella Reichert, MD 09/27/16 [phone]

## 2016-09-27 NOTE — ED Triage Notes (Signed)
Pt brought in by ems for  c/o right foot pain  X 1 day, denies injury

## 2016-09-28 ENCOUNTER — Other Ambulatory Visit: Payer: Self-pay | Admitting: Family Medicine

## 2016-09-28 ENCOUNTER — Ambulatory Visit (HOSPITAL_BASED_OUTPATIENT_CLINIC_OR_DEPARTMENT_OTHER)
Admission: RE | Admit: 2016-09-28 | Discharge: 2016-09-28 | Disposition: A | Discharge status: Home / Self Care | Payer: Medicare Other | Source: Ambulatory Visit | Attending: Family Medicine | Admitting: Family Medicine

## 2016-09-28 DIAGNOSIS — M65871 Other synovitis and tenosynovitis, right ankle and foot: Secondary | ICD-10-CM | POA: Diagnosis not present

## 2016-09-28 DIAGNOSIS — M79671 Pain in right foot: Secondary | ICD-10-CM | POA: Diagnosis not present

## 2016-09-28 DIAGNOSIS — I480 Paroxysmal atrial fibrillation: Secondary | ICD-10-CM | POA: Diagnosis not present

## 2016-09-28 DIAGNOSIS — M659 Synovitis and tenosynovitis, unspecified: Secondary | ICD-10-CM | POA: Diagnosis not present

## 2016-09-28 DIAGNOSIS — Z7901 Long term (current) use of anticoagulants: Secondary | ICD-10-CM | POA: Diagnosis not present

## 2016-09-28 DIAGNOSIS — M25571 Pain in right ankle and joints of right foot: Secondary | ICD-10-CM | POA: Diagnosis not present

## 2016-09-30 ENCOUNTER — Emergency Department (HOSPITAL_COMMUNITY): Payer: Medicare Other

## 2016-09-30 ENCOUNTER — Emergency Department (HOSPITAL_COMMUNITY)
Admission: EM | Admit: 2016-09-30 | Discharge: 2016-09-30 | Disposition: A | Discharge status: Home / Self Care | Payer: Medicare Other | Attending: Emergency Medicine | Admitting: Emergency Medicine

## 2016-09-30 ENCOUNTER — Encounter (HOSPITAL_COMMUNITY): Payer: Self-pay | Admitting: Emergency Medicine

## 2016-09-30 DIAGNOSIS — I10 Essential (primary) hypertension: Secondary | ICD-10-CM | POA: Insufficient documentation

## 2016-09-30 DIAGNOSIS — R51 Headache: Secondary | ICD-10-CM | POA: Diagnosis not present

## 2016-09-30 DIAGNOSIS — J449 Chronic obstructive pulmonary disease, unspecified: Secondary | ICD-10-CM | POA: Insufficient documentation

## 2016-09-30 DIAGNOSIS — Z79899 Other long term (current) drug therapy: Secondary | ICD-10-CM | POA: Insufficient documentation

## 2016-09-30 DIAGNOSIS — Z8673 Personal history of transient ischemic attack (TIA), and cerebral infarction without residual deficits: Secondary | ICD-10-CM | POA: Insufficient documentation

## 2016-09-30 DIAGNOSIS — I2510 Atherosclerotic heart disease of native coronary artery without angina pectoris: Secondary | ICD-10-CM | POA: Diagnosis not present

## 2016-09-30 DIAGNOSIS — R0902 Hypoxemia: Secondary | ICD-10-CM | POA: Diagnosis not present

## 2016-09-30 DIAGNOSIS — R079 Chest pain, unspecified: Secondary | ICD-10-CM | POA: Diagnosis not present

## 2016-09-30 DIAGNOSIS — R5381 Other malaise: Secondary | ICD-10-CM | POA: Diagnosis not present

## 2016-09-30 DIAGNOSIS — R5383 Other fatigue: Secondary | ICD-10-CM | POA: Diagnosis not present

## 2016-09-30 DIAGNOSIS — R0602 Shortness of breath: Secondary | ICD-10-CM | POA: Diagnosis not present

## 2016-09-30 DIAGNOSIS — R5382 Chronic fatigue, unspecified: Secondary | ICD-10-CM | POA: Diagnosis not present

## 2016-09-30 LAB — CBC
HCT: 36.6 % (ref 36.0–46.0)
HEMOGLOBIN: 11.4 g/dL — AB (ref 12.0–15.0)
MCH: 30.2 pg (ref 26.0–34.0)
MCHC: 31.1 g/dL (ref 30.0–36.0)
MCV: 96.8 fL (ref 78.0–100.0)
Platelets: 212 10*3/uL (ref 150–400)
RBC: 3.78 MIL/uL — AB (ref 3.87–5.11)
RDW: 15.8 % — ABNORMAL HIGH (ref 11.5–15.5)
WBC: 7.4 10*3/uL (ref 4.0–10.5)

## 2016-09-30 LAB — COMPREHENSIVE METABOLIC PANEL
ALT: 15 U/L (ref 14–54)
ANION GAP: 7 (ref 5–15)
AST: 18 U/L (ref 15–41)
Albumin: 3.8 g/dL (ref 3.5–5.0)
Alkaline Phosphatase: 75 U/L (ref 38–126)
BILIRUBIN TOTAL: 0.7 mg/dL (ref 0.3–1.2)
BUN: 21 mg/dL — AB (ref 6–20)
CHLORIDE: 101 mmol/L (ref 101–111)
CO2: 30 mmol/L (ref 22–32)
Calcium: 9 mg/dL (ref 8.9–10.3)
Creatinine, Ser: 1.3 mg/dL — ABNORMAL HIGH (ref 0.44–1.00)
GFR calc Af Amer: 43 mL/min — ABNORMAL LOW (ref >60)
GFR, EST NON AFRICAN AMERICAN: 37 mL/min — AB (ref >60)
Glucose, Bld: 137 mg/dL — ABNORMAL HIGH (ref 65–99)
POTASSIUM: 4.5 mmol/L (ref 3.5–5.1)
Sodium: 138 mmol/L (ref 135–145)
TOTAL PROTEIN: 6.5 g/dL (ref 6.5–8.1)

## 2016-09-30 LAB — URINALYSIS, ROUTINE W REFLEX MICROSCOPIC
BACTERIA UA: NONE SEEN
BILIRUBIN URINE: NEGATIVE
GLUCOSE, UA: NEGATIVE mg/dL
Hgb urine dipstick: NEGATIVE
Ketones, ur: NEGATIVE mg/dL
NITRITE: NEGATIVE
PROTEIN: NEGATIVE mg/dL
Specific Gravity, Urine: 1.017 (ref 1.005–1.030)
pH: 5 (ref 5.0–8.0)

## 2016-09-30 LAB — I-STAT TROPONIN, ED
TROPONIN I, POC: 0 ng/mL (ref 0.00–0.08)
Troponin i, poc: 0.01 ng/mL (ref 0.00–0.08)

## 2016-09-30 LAB — PROTIME-INR
INR: 2.43
PROTHROMBIN TIME: 26.9 s — AB (ref 11.4–15.2)

## 2016-09-30 NOTE — ED Notes (Signed)
Pacemaker interrogated, awaiting fax with results

## 2016-09-30 NOTE — ED Provider Notes (Signed)
[facility] DEPT Provider Note   CSN: [id number] Arrival date & time: 09/30/16  1806     History   Chief Complaint Chief Complaint  Patient presents with  . Chest Pain  . Fatigue  . Headache    HPI Lynn Dunn is a 83 y.o. female.  The history is provided by the patient. No language interpreter was used.  Chest Pain   Associated symptoms include headaches.  Headache      Lynn Dunn is a 83 y.o. female who presents to the Emergency Department complaining of multiple complaints.  She presents for feeling poorly with multiple complaints. Several days ago she developed pain in her right foot that is worse with walking. She was seen in the emergency department and then to by her family doctor. She has been taking hydrocodone and colchicine for the foot pain which is thought to be treated due to gout. Today she developed generalized malaise with headache and body ache. She reports associated nausea and chest congestion. She does have shortness of breath with this as been present for the last 6-8 months and is unchanged from baseline. She has ongoing pain in her foot but is unchanged from several days ago. She does take Coumadin for history of atrial fibrillation.  Past Medical History:  Diagnosis Date  . Anemia   . Anginal pain ([place]) 2011   had to take nitro   . Arthritis   . COPD (chronic obstructive pulmonary disease) ([place])   . Dysuria   . GERD (gastroesophageal reflux disease)   . Hematuria   . HTN (hypertension)   . Hx of echocardiogram    Echo (12/15):  Mild LVH, EF 60-65%, mild MR, mod LAE (LA 49 mm), PASP 43 mmHg  . Obesity   . OSA (obstructive sleep apnea)    cpap at home  . PAF (paroxysmal atrial fibrillation) ([place])    s/p ablation 07/29/09  . Sick sinus syndrome ([facility])    pauses with syncope  . Stroke ([place]) 6/09   NO RESIDUAL PROBLEMS    Patient Active Problem List   Diagnosis Date Noted  . COPD exacerbation ([place]) 04/05/2016  . Subtherapeutic  international normalized ratio (INR) 04/05/2016  . Atrial fibrillation with rapid ventricular response ([place]) 04/04/2016  . Carotid artery disease ([place]) 11/25/2015  . Pre-syncope 02/16/2015  . Sick sinus syndrome ([place]) 02/15/2015  . Swelling of right lower extremity 08/31/2014  . Mixed hyperlipidemia 12/27/2012  . Obesity 12/27/2012  . GI bleed 01/23/2012  . Anemia 01/23/2012  . History of CVA (cerebrovascular accident) 01/23/2012  . SHORTNESS OF BREATH (SOB) 09/13/2009  . Essential hypertension, benign 06/17/2009  . Atrial fibrillation ([place]) 06/17/2009  . OSA (obstructive sleep apnea) 06/17/2009    Past Surgical History:  Procedure Laterality Date  . ABDOMINAL HYSTERECTOMY    . ATRIAL ABLATION SURGERY     s/p ablation for afib 07/29/09  . CYSTOSCOPY N/A 06/23/2015   Procedure: CYSTOSCOPY;  Surgeon: Alexis Frock, MD;  Location: [facility];  Service: Urology;  Laterality: N/A;  . EP IMPLANTABLE DEVICE N/A 02/17/2015   MDT Advisa MRI PPM implanted by Dr Lovena Le for sick sinus and syncope  . EP IMPLANTABLE DEVICE N/A 02/17/2015   Procedure: Loop Recorder Removal;  Surgeon: Evans Lance, MD;  Location: [facility] LAB;  Service: Cardiovascular;  Laterality: N/A;  . ESOPHAGOGASTRODUODENOSCOPY  01/24/2012   Procedure: ESOPHAGOGASTRODUODENOSCOPY (EGD);  Surgeon: Jeryl Columbia, MD;  Location: [facility] ENDOSCOPY;  Service: Endoscopy;  Laterality: N/A;  .  KNEE ARTHROSCOPY     BILATERAL  . LOOP RECORDER IMPLANT N/A 03/12/2014   Procedure: LOOP RECORDER IMPLANT;  Surgeon: Thompson Grayer, MD;  Location: [facility] CATH LAB;  Service: Cardiovascular;  Laterality: N/A;  . LYSIS OF ADHESION N/A 06/23/2015   Procedure: LYSIS OF LABIAL ADHESION;  Surgeon: Alexis Frock, MD;  Location: [facility];  Service: Urology;  Laterality: N/A;    OB History    No data available       Home Medications    Prior to Admission medications   Medication Sig Start Date End Date Taking? Authorizing Provider  amiodarone (PACERONE)  200 MG tablet Take 1/2 tablet (100 mg) by mouth once daily 08/09/16  Yes Allred, Jeneen Rinks, MD  Cholecalciferol (VITAMIN D PO) Take 1 capsule by mouth every morning.    Yes [provider]  Coenzyme Q10 (COQ10 PO) Take 1 capsule by mouth every morning.    Yes [provider]  estradiol (ESTRACE VAGINAL) 0.1 MG/GM vaginal cream Apply pea-sized amount to vagina / urethra 2-3 x weekly to prevent labial adhesions 06/23/15  Yes Alexis Frock, MD  furosemide (LASIX) 40 MG tablet Take 120 mg by mouth daily.   Yes [provider]  HYDROcodone-acetaminophen (NORCO) 7.5-325 MG tablet Take 1 tablet by mouth 2 (two) times daily.  07/04/16  Yes [provider]  KRILL OIL OMEGA-3 PO Take 1 capsule by mouth twice daily.   Yes [provider]  Magnesium 100 MG CAPS Take 1 capsule by mouth 2 (two) times daily.    Yes [provider]  metoprolol tartrate (LOPRESSOR) 50 MG tablet Take 1/2 tablet (25 mg) by mouth twice a day Patient taking differently: Take 50 mg by mouth 2 (two) times daily.  08/09/16  Yes Allred, Jeneen Rinks, MD  ondansetron (ZOFRAN) 4 MG tablet Take 4 mg by mouth as needed for nausea/vomiting. 09/30/16  Yes [provider]  potassium chloride SA (K-DUR,KLOR-CON) 20 MEQ tablet Take 20 mEq by mouth 2 (two) times daily.    Yes [provider]  rosuvastatin (CRESTOR) 10 MG tablet Take 10 mg by mouth daily. 09/14/16  Yes [provider]  Turmeric 500 MG CAPS Take 500 mg by mouth 2 (two) times daily.   Yes [provider]  warfarin (COUMADIN) 1 MG tablet Take 3-4 mg by mouth See admin instructions. Take 4 mg by mouth on Wed Take 3 mg by mouth on Mon, Tues, Thurs, Fri, Sat, Sun   Yes [provider]  traMADol (ULTRAM) 50 MG tablet Take 1 tablet (50 mg total) by mouth every 12 (twelve) hours as needed. Patient not taking: Reported on 09/30/2016 09/27/16   Quintella Reichert, MD    Family History Family History  Problem  Relation Age of Onset  . Breast cancer Mother   . Brain cancer Father   . Breast cancer Unknown   . Brain cancer Unknown   . Stroke Maternal Aunt   . Hypertension Sister   . Heart attack Neg Hx     Social History Social History  Substance Use Topics  . Smoking status: Never Smoker  . Smokeless tobacco: Never Used  . Alcohol use No     Allergies   Ace inhibitors; Albuterol; Albuterol sulfate; and Sulfa antibiotics   Review of Systems Review of Systems  Cardiovascular: Positive for chest pain.  Neurological: Positive for headaches.  All other systems reviewed and are negative.    Physical Exam Updated Vital Signs BP (!) 168/68   Pulse 60  Temp 97.8 F (36.6 C) (Oral)   Resp 14   SpO2 94%   Physical Exam  Constitutional: She is oriented to person, place, and time. She appears well-developed and well-nourished.  HENT:  Head: Normocephalic and atraumatic.  Cardiovascular: Normal rate and regular rhythm.   No murmur heard. Pulmonary/Chest: Effort normal and breath sounds normal. No respiratory distress.  Abdominal: Soft. There is no tenderness. There is no rebound and no guarding.  Musculoskeletal:  Nonpitting edema to bilateral lower extremities. 2+ DP pulses bilaterally. There is tenderness to palpation to the right lateral midfoot without any erythema.  Neurological: She is alert and oriented to person, place, and time.  Skin: Skin is warm and dry.  Psychiatric: She has a normal mood and affect. Her behavior is normal.  Nursing note and vitals reviewed.    ED Treatments / Results  Labs (all labs ordered are listed, but only abnormal results are displayed) Labs Reviewed  CBC - Abnormal; Notable for the following:       Result Value   RBC 3.78 (*)    Hemoglobin 11.4 (*)    RDW 15.8 (*)    All other components within normal limits  URINALYSIS, ROUTINE W REFLEX MICROSCOPIC - Abnormal; Notable for the following:    Leukocytes, UA TRACE (*)    Squamous  Epithelial / LPF 0-5 (*)    All other components within normal limits  COMPREHENSIVE METABOLIC PANEL - Abnormal; Notable for the following:    Glucose, Bld 137 (*)    BUN 21 (*)    Creatinine, Ser 1.30 (*)    GFR calc non Af Amer 37 (*)    GFR calc Af Amer 43 (*)    All other components within normal limits  PROTIME-INR - Abnormal; Notable for the following:    Prothrombin Time 26.9 (*)    All other components within normal limits  I-STAT TROPONIN, ED  I-STAT TROPONIN, ED    EKG  EKG Interpretation None       Radiology Dg Chest 2 View  Result Date: 09/30/2016 CLINICAL DATA:  Shortness of breath. EXAM: CHEST  2 VIEW COMPARISON:  Radiographs of June 05, 2016. FINDINGS: Stable cardiomegaly. Left-sided pacemaker is unchanged in position. No pneumothorax or pleural effusion is noted. No acute pulmonary disease is noted. Multilevel degenerative disc disease is noted in the thoracic spine. IMPRESSION: No active cardiopulmonary disease. Electronically Signed   By: Marijo Conception, M.D.   On: 09/30/2016 19:29    Procedures Procedures (including critical care time)  Medications Ordered in ED Medications - No data to display   Initial Impression / Assessment and Plan / ED Course  I have reviewed the triage vital signs and the nursing notes.  Pertinent labs & imaging results that were available during my care of the patient were reviewed by me and considered in my medical decision making (see chart for details).     Patient here for evaluation of multiple complaints including nausea, headache, chest congestion, malaise. She is nontoxic appearing on examination. Presentation is not consistent with ACS, CHF, PE, dissection, CVA, sepsis, pneumonia. On repeat assessment she is feeling improved. Counseled patient on home care, outpatient follow-up and return precautions.  Final Clinical Impressions(s) / ED Diagnoses   Final diagnoses:  Malaise and fatigue    New Prescriptions New  Prescriptions   No medications on file     Quintella Reichert, MD 09/30/16 2325

## 2016-09-30 NOTE — ED Notes (Signed)
Patient transported to X-ray with transporter 

## 2016-09-30 NOTE — ED Triage Notes (Signed)
Pt from home via EMS with c/o feeling "unwell all over." Pt c/o frontal headache, right foot pain, nausea (relieved from zofran at home), shortness of breath (no different from her norm), and fatigue.

## 2016-10-05 DIAGNOSIS — M79671 Pain in right foot: Secondary | ICD-10-CM | POA: Diagnosis not present

## 2016-10-05 DIAGNOSIS — M25571 Pain in right ankle and joints of right foot: Secondary | ICD-10-CM | POA: Diagnosis not present

## 2016-10-06 ENCOUNTER — Telehealth: Payer: Self-pay | Admitting: Internal Medicine

## 2016-10-06 NOTE — Telephone Encounter (Signed)
New message    Pt is calling. She said she needs to know what to do about something that showed up on her heart CT scan.

## 2016-10-06 NOTE — Telephone Encounter (Signed)
Pt asking about CT result stating: Visualized portions of the trachea appear partially collapsed, which could suggest tracheomalacia  She says someone called her about this but she isn't sure what they said or what needs to be done to see if this is the reason for her SOB. She states she thinks they told her to follow up with her PCP about this.  Informed pt that this test has not been read by Dr. Irish Lack and also that "appear partially collapsed" does not mean that it is collapsed or that there is an issue.  Pt understands nurse will follow up with her after read by Dr. Irish Lack

## 2016-10-06 NOTE — Telephone Encounter (Signed)
I did send a note about this test for her to f/u with PCP regarding tracheomalacia.  THis was last week I belileve.  Dr. Rayann Heman and I discussed this.  Not sure if there is anything that will be done.  Please send a CT report copy to her PMD.

## 2016-10-09 NOTE — Telephone Encounter (Signed)
No voice mail box.

## 2016-10-09 NOTE — Telephone Encounter (Signed)
Voice mailbox not set up, unable to reach patient.

## 2016-10-09 NOTE — Telephone Encounter (Signed)
Attempted to contact pt, no voice mailbox set up.

## 2016-10-10 NOTE — Telephone Encounter (Signed)
Attempted to contact patient, but there was no answer. Voice mailbox is not set up. CT results have already been routed to Dr. Olen Pel on 7/27.

## 2016-10-12 NOTE — Telephone Encounter (Signed)
Lynn Dunn is returning a call . Thanks

## 2016-10-12 NOTE — Telephone Encounter (Signed)
Returned call to patient and made her aware of her CT results again and let her know that she needs to call Dr. Olen Pel and schedule an appointment for an evaluation for possible tracheomalacia. Made her aware that I already sent over the CT results to Dr. Olen Pel. Patient verbalized understanding and thanked me for the call.

## 2016-10-23 ENCOUNTER — Other Ambulatory Visit: Payer: Self-pay | Admitting: *Deleted

## 2016-10-23 MED ORDER — AMIODARONE HCL 200 MG PO TABS: ORAL_TABLET | ORAL | 2 refills | Status: DC

## 2016-10-26 DIAGNOSIS — G4733 Obstructive sleep apnea (adult) (pediatric): Secondary | ICD-10-CM | POA: Diagnosis not present

## 2016-10-26 DIAGNOSIS — L918 Other hypertrophic disorders of the skin: Secondary | ICD-10-CM | POA: Diagnosis not present

## 2016-10-26 DIAGNOSIS — J449 Chronic obstructive pulmonary disease, unspecified: Secondary | ICD-10-CM | POA: Diagnosis not present

## 2016-10-26 DIAGNOSIS — Z7901 Long term (current) use of anticoagulants: Secondary | ICD-10-CM | POA: Diagnosis not present

## 2016-10-26 DIAGNOSIS — J398 Other specified diseases of upper respiratory tract: Secondary | ICD-10-CM | POA: Diagnosis not present

## 2016-11-13 ENCOUNTER — Encounter: Payer: Self-pay | Admitting: Internal Medicine

## 2016-11-13 ENCOUNTER — Ambulatory Visit (INDEPENDENT_AMBULATORY_CARE_PROVIDER_SITE_OTHER): Payer: Medicare Other | Admitting: Internal Medicine

## 2016-11-13 VITALS — BP 118/62 | HR 60 | Ht 60.0 in | Wt 211.2 lb

## 2016-11-13 DIAGNOSIS — R0602 Shortness of breath: Secondary | ICD-10-CM

## 2016-11-13 DIAGNOSIS — I481 Persistent atrial fibrillation: Secondary | ICD-10-CM

## 2016-11-13 DIAGNOSIS — I495 Sick sinus syndrome: Secondary | ICD-10-CM

## 2016-11-13 DIAGNOSIS — G4733 Obstructive sleep apnea (adult) (pediatric): Secondary | ICD-10-CM

## 2016-11-13 DIAGNOSIS — Z9989 Dependence on other enabling machines and devices: Secondary | ICD-10-CM | POA: Diagnosis not present

## 2016-11-13 DIAGNOSIS — I4819 Other persistent atrial fibrillation: Secondary | ICD-10-CM

## 2016-11-13 LAB — CUP PACEART INCLINIC DEVICE CHECK
Battery Voltage: 3.02 V
Brady Statistic AP VS Percent: 60.01 %
Brady Statistic AS VP Percent: 0.04 %
Brady Statistic RA Percent Paced: 60.03 %
Implantable Lead Implant Date: 20161214
Implantable Lead Implant Date: 20161214
Implantable Lead Location: 753860
Implantable Lead Model: 5076
Implantable Pulse Generator Implant Date: 20161214
Lead Channel Impedance Value: 361 Ohm
Lead Channel Pacing Threshold Amplitude: 1.125 V
Lead Channel Pacing Threshold Pulse Width: 0.4 ms
Lead Channel Pacing Threshold Pulse Width: 0.4 ms
Lead Channel Sensing Intrinsic Amplitude: 19.875 mV
Lead Channel Sensing Intrinsic Amplitude: 2.625 mV
Lead Channel Sensing Intrinsic Amplitude: 3.25 mV
Lead Channel Setting Pacing Amplitude: 2.25 V
Lead Channel Setting Pacing Amplitude: 2.5 V
MDC IDC LEAD LOCATION: 753859
MDC IDC MSMT BATTERY REMAINING LONGEVITY: 109 mo
MDC IDC MSMT LEADCHNL RA IMPEDANCE VALUE: 475 Ohm
MDC IDC MSMT LEADCHNL RV IMPEDANCE VALUE: 513 Ohm
MDC IDC MSMT LEADCHNL RV IMPEDANCE VALUE: 551 Ohm
MDC IDC MSMT LEADCHNL RV PACING THRESHOLD AMPLITUDE: 0.75 V
MDC IDC MSMT LEADCHNL RV SENSING INTR AMPL: 14.875 mV
MDC IDC SESS DTM: 20180910105832
MDC IDC SET LEADCHNL RV PACING PULSEWIDTH: 0.4 ms
MDC IDC SET LEADCHNL RV SENSING SENSITIVITY: 2.8 mV
MDC IDC STAT BRADY AP VP PERCENT: 0.02 %
MDC IDC STAT BRADY AS VS PERCENT: 39.93 %
MDC IDC STAT BRADY RV PERCENT PACED: 0.06 %

## 2016-11-13 NOTE — Progress Notes (Signed)
PCP: [name], MD Primary Cardiologist:  Dr Irish Lack Primary EP:  Dr Rayann Heman  Lynn Dunn is a 83 y.o. female who presents today for routine electrophysiology followup.  Since last being seen in our clinic, the patient reports doing reasonably well.  She has stable chronic SOB.  She is frustrated by this.  Today, she denies symptoms of palpitations, chest pain,  lower extremity edema, dizziness, presyncope, or syncope.  The patient is otherwise without complaint today.   Past Medical History:  Diagnosis Date  . Anemia   . Anginal pain ([place]) 2011   had to take nitro   . Arthritis   . COPD (chronic obstructive pulmonary disease) ([place])   . Dysuria   . GERD (gastroesophageal reflux disease)   . Hematuria   . HTN (hypertension)   . Hx of echocardiogram    Echo (12/15):  Mild LVH, EF 60-65%, mild MR, mod LAE (LA 49 mm), PASP 43 mmHg  . Obesity   . OSA (obstructive sleep apnea)    cpap at home  . PAF (paroxysmal atrial fibrillation) ([place])    s/p ablation 07/29/09  . Sick sinus syndrome (H[facility])    pauses with syncope  . Stroke ([place]) 6/09   NO RESIDUAL PROBLEMS   Past Surgical History:  Procedure Laterality Date  . ABDOMINAL HYSTERECTOMY    . ATRIAL ABLATION SURGERY     s/p ablation for afib 07/29/09  . CYSTOSCOPY N/A 06/23/2015   Procedure: CYSTOSCOPY;  Surgeon: Alexis Frock, MD;  [facility]: [facility];  Service: Urology;  Laterality: N/A;  . EP IMPLANTABLE DEVICE N/A 02/17/2015   MDT Advisa MRI PPM implanted by Dr Lovena Le for sick sinus and syncope  . EP IMPLANTABLE DEVICE N/A 02/17/2015   Procedure: Loop Recorder Removal;  Surgeon: Evans Lance, MD;  Location: [facility] LAB;  Service: Cardiovascular;  Laterality: N/A;  . ESOPHAGOGASTRODUODENOSCOPY  01/24/2012   Procedure: ESOPHAGOGASTRODUODENOSCOPY (EGD);  Surgeon: Jeryl Columbia, MD;  Location: [facility] ENDOSCOPY;  Service: Endoscopy;  Laterality: N/A;  . KNEE ARTHROSCOPY     BILATERAL  . LOOP RECORDER IMPLANT N/A 03/12/2014    Procedure: LOOP RECORDER IMPLANT;  Surgeon: Thompson Grayer, MD;  Location: [facility] CATH LAB;  Service: Cardiovascular;  Laterality: N/A;  . LYSIS OF ADHESION N/A 06/23/2015   Procedure: LYSIS OF LABIAL ADHESION;  Surgeon: Alexis Frock, MD;  [facility]: [facility];  Service: Urology;  Laterality: N/A;    ROS- all systems are reviewed and negative except as per HPI above  Current Outpatient Prescriptions  Medication Sig Dispense Refill  . amiodarone (PACERONE) 200 MG tablet Take 1/2 tablet (100 mg) by mouth once daily 45 tablet 2  . Cholecalciferol (VITAMIN D PO) Take 1 capsule by mouth every morning.     . Coenzyme Q10 (COQ10 PO) Take 1 capsule by mouth every morning.     Marland Kitchen estradiol (ESTRACE VAGINAL) 0.1 MG/GM vaginal cream Apply pea-sized amount to vagina / urethra 2-3 x weekly to prevent labial adhesions 42.5 g 12  . furosemide (LASIX) 40 MG tablet Take 120 mg by mouth daily.    Marland Kitchen KRILL OIL OMEGA-3 PO Take 1 capsule by mouth twice daily.    . Magnesium 100 MG CAPS Take 1 capsule by mouth 2 (two) times daily.     . metoprolol tartrate (LOPRESSOR) 50 MG tablet Take 50 mg by mouth 2 (two) times daily.    . potassium chloride SA (K-DUR,KLOR-CON) 20 MEQ tablet Take 20 mEq by mouth 2 (  two) times daily.     . rosuvastatin (CRESTOR) 10 MG tablet Take 10 mg by mouth daily.    . Turmeric 500 MG CAPS Take 500 mg by mouth 2 (two) times daily.    Marland Kitchen warfarin (COUMADIN) 1 MG tablet Take 3-4 mg by mouth See admin instructions. Take 4 mg by mouth on Wed Take 3 mg by mouth on Mon, Tues, Thurs, Fri, Sat, Sun    . traMADol (ULTRAM) 50 MG tablet Take 1 tablet (50 mg total) by mouth every 12 (twelve) hours as needed. (Patient not taking: Reported on 11/13/2016) 8 tablet 0   No current facility-administered medications for this visit.     Physical Exam: Vitals:   11/13/16 1015  BP: 118/62  Pulse: 60  SpO2: 92%  Weight: 211 lb 3.2 oz (95.8 kg)  Height: 5' (1.524 m)    GEN- The patient is well appearing, alert  and oriented x 3 today.   Head- normocephalic, atraumatic Eyes-  Sclera clear, conjunctiva pink Ears- hearing intact Oropharynx- clear Lungs- Clear to ausculation bilaterally, normal work of breathing Chest- pacemaker pocket is well healed Heart- Regular rate and rhythm, no murmurs, rubs or gallops, PMI not laterally displaced GI- soft, NT, ND, + BS Extremities- no clubbing, cyanosis, or edema  Pacemaker interrogation- reviewed in detail today,  See PACEART report  ekg tracing ordered today is personally reviewed and shows atrial paced rhythm 60 bpm, PR 242 msec  Assessment and Plan:  1. Symptomatic sinus bradycardia  Normal pacemaker function See Pace Art report Rate response turned on today and patient ambulated with improved heart rate response.  Remains SOB  2. afib No episodes since April by [facility] interrogation today Continue on low dose amiodarone (SOB proceeds amiodarone).  Labs 7/18 reviewed Continue long term anticoagulation  3. OSA Compliance with CPAP encouraged  4. SOB Chronic, proceeds amidoarone Recent CT reviewed and reveals no pulmonary vein stenosis.  She did have suggestion of possible tracheomalacia and is planned to see Dr Lamonte Sakai soon.  Further workup per Dr Irish Lack Consider CPX testing if SOB cause is not found  5. Obesity Body mass index is 41.25 kg/m. Lifestyle modification encouraged  6. CAD On CT, FFR CT was advised.  I spoke with Dr Irish Lack today.  His suspicion for CAD as cause of symptoms is low.  I will ask Dr Meda Coffee to perform FFR.  carelink Return to see EP NP in 6 months Will need LFTs, TFTs at that time Follow-up with Dr Irish Lack as scheduled  Thompson Grayer MD, [facility] 11/13/2016 10:33 AM

## 2016-11-13 NOTE — Patient Instructions (Signed)
Medication Instructions:  Your physician recommends that you continue on your current medications as directed. Please refer to the Current Medication list given to you today.   Labwork: None ordered   Testing/Procedures: None ordered   Follow-Up: Your physician wants you to follow-up in: 6 months with Chanetta Marshall, NP You will receive a reminder letter in the mail two months in advance. If you don't receive a letter, please call our office to schedule the follow-up appointment.  Remote monitoring is used to monitor your Pacemaker from home. This monitoring reduces the number of office visits required to check your device to one time per year. It allows [place] to keep an eye on the functioning of your device to ensure it is working properly. You are scheduled for a device check from home on 11/27/16. You may send your transmission at any time that day. If you have a wireless device, the transmission will be sent automatically. After your physician reviews your transmission, you will receive a postcard with your next transmission date.    Thank you for choosing [facility]!!     Janan Halter, RN [phone]

## 2016-11-23 DIAGNOSIS — I4891 Unspecified atrial fibrillation: Secondary | ICD-10-CM | POA: Diagnosis not present

## 2016-11-23 DIAGNOSIS — Z7901 Long term (current) use of anticoagulants: Secondary | ICD-10-CM | POA: Diagnosis not present

## 2016-11-27 ENCOUNTER — Ambulatory Visit (INDEPENDENT_AMBULATORY_CARE_PROVIDER_SITE_OTHER): Payer: Medicare Other | Admitting: *Deleted

## 2016-11-27 ENCOUNTER — Telehealth: Payer: Self-pay | Admitting: Cardiology

## 2016-11-27 DIAGNOSIS — I495 Sick sinus syndrome: Secondary | ICD-10-CM | POA: Diagnosis not present

## 2016-11-27 NOTE — Telephone Encounter (Signed)
LMOVM reminding pt to send remote transmission.   

## 2016-11-28 LAB — CUP PACEART REMOTE DEVICE CHECK
Battery Voltage: 3.01 V
Brady Statistic AP VP Percent: 0.1 %
Brady Statistic AP VS Percent: 92.12 %
Brady Statistic AS VP Percent: 0.03 %
Brady Statistic RA Percent Paced: 92.22 %
Implantable Lead Implant Date: 20161214
Implantable Lead Location: 753859
Implantable Lead Model: 5076
Implantable Lead Model: 5076
Lead Channel Impedance Value: 361 Ohm
Lead Channel Impedance Value: 475 Ohm
Lead Channel Impedance Value: 494 Ohm
Lead Channel Pacing Threshold Amplitude: 0.75 V
Lead Channel Pacing Threshold Amplitude: 1 V
Lead Channel Pacing Threshold Pulse Width: 0.4 ms
Lead Channel Pacing Threshold Pulse Width: 0.4 ms
Lead Channel Sensing Intrinsic Amplitude: 14.125 mV
Lead Channel Sensing Intrinsic Amplitude: 14.125 mV
Lead Channel Sensing Intrinsic Amplitude: 2.625 mV
Lead Channel Setting Pacing Amplitude: 2 V
Lead Channel Setting Pacing Amplitude: 2.5 V
Lead Channel Setting Pacing Pulse Width: 0.4 ms
MDC IDC LEAD IMPLANT DT: 20161214
MDC IDC LEAD LOCATION: 753860
MDC IDC MSMT BATTERY REMAINING LONGEVITY: 103 mo
MDC IDC MSMT LEADCHNL RA SENSING INTR AMPL: 2.625 mV
MDC IDC MSMT LEADCHNL RV IMPEDANCE VALUE: 532 Ohm
MDC IDC PG IMPLANT DT: 20161214
MDC IDC SESS DTM: 20180925033104
MDC IDC SET LEADCHNL RV SENSING SENSITIVITY: 2.8 mV
MDC IDC STAT BRADY AS VS PERCENT: 7.75 %
MDC IDC STAT BRADY RV PERCENT PACED: 0.13 %

## 2016-11-28 NOTE — Progress Notes (Signed)
Remote pacemaker transmission.   

## 2016-11-29 DIAGNOSIS — Z7901 Long term (current) use of anticoagulants: Secondary | ICD-10-CM | POA: Diagnosis not present

## 2016-11-30 ENCOUNTER — Encounter: Payer: Self-pay | Admitting: Cardiology

## 2016-12-03 DIAGNOSIS — Z23 Encounter for immunization: Secondary | ICD-10-CM | POA: Diagnosis not present

## 2016-12-07 ENCOUNTER — Encounter: Payer: Self-pay | Admitting: *Deleted

## 2016-12-07 ENCOUNTER — Ambulatory Visit (INDEPENDENT_AMBULATORY_CARE_PROVIDER_SITE_OTHER): Payer: Medicare Other | Admitting: Emergency Medicine

## 2016-12-07 VITALS — BP 130/86 | HR 87 | Ht 60.0 in | Wt 215.0 lb

## 2016-12-07 DIAGNOSIS — R0600 Dyspnea, unspecified: Secondary | ICD-10-CM

## 2016-12-07 DIAGNOSIS — G4733 Obstructive sleep apnea (adult) (pediatric): Secondary | ICD-10-CM

## 2016-12-07 DIAGNOSIS — J398 Other specified diseases of upper respiratory tract: Secondary | ICD-10-CM

## 2016-12-07 DIAGNOSIS — R0602 Shortness of breath: Secondary | ICD-10-CM | POA: Diagnosis not present

## 2016-12-07 DIAGNOSIS — J9809 Other diseases of bronchus, not elsewhere classified: Secondary | ICD-10-CM | POA: Diagnosis not present

## 2016-12-07 NOTE — Assessment & Plan Note (Signed)
Suspected based on findings on her cardiac CT scan. There was esophageal enlargement and thickening with bowing of the posterior tracheal wall. She needs airway inspection. We will repeat a dedicated CT scan of the chest. Pulmonary function testing to assess for dynamic obstruction. She could be a candidate for stenting depending on the workup.  We will arrange for a bronchoscopy to inspect your trachea and airways We will perform CT scan of the chest without contrast

## 2016-12-07 NOTE — Assessment & Plan Note (Signed)
Currently on CPAP but her device is old and she has not been re-titrated in many years. I suspect that she will require a repeat split night sleep study since she only had borderline sleep apnea on her initial study. She is going to think about the timing of when she wants to go back to the sleep lab. Continue her current CPAP for now

## 2016-12-07 NOTE — Assessment & Plan Note (Addendum)
With exertion. Can also happen at times at rest with some apparent upper airway obstruction/snoring even while awake. Unclear whether there is a contribution of tracheal malacia here. This will need to be evaluated. She is obese and suspect that restrictive lung disease plus deconditioning are playing a role here  We will perform pulmonary function testing We will arrange for a bronchoscopy to inspect your trachea and airways We will perform CT scan of the chest without contrast We may need to repeat your sleep study in the future to assess her degree of sleep apnea and to re-titrate your pressures, get a new machine. Follow with Dr Lamonte Sakai next available after your testing to discuss the results.

## 2016-12-07 NOTE — Patient Instructions (Addendum)
We will perform pulmonary function testing We will arrange for a bronchoscopy to inspect your trachea and airways We will perform CT scan of the chest without contrast We may need to repeat your sleep study in the future to assess her degree of sleep apnea and to re-titrate your pressures, get a new machine. Follow with Dr Lamonte Sakai next available after your testing to discuss the results.

## 2016-12-07 NOTE — Progress Notes (Signed)
Subjective:    Patient ID: Lynn Dunn, female    DOB: 03-05-1931, 83 y.o.   MRN: [id number]  HPI 83 year old obese woman never smoker with a history of hypertension, obstructive sleep apnea on CPAP, atrial fibrillation with sick sinus syndrome status post ablation, stroke. Apparently she has also been given diagnosis of obstructive lung disease at some point in the past and was given albuterol - didn't tolerate. She is referred today for evaluation of suspected tracheomalacia seen on cardiac CT scan 09/2016.   I reviewed the CT chest, appears to show some posterior wall involution of the trachea. Also note that the esophageal wall appears somewhat enlarged and thickened.  She tells me that she has had longstanding dyspnea. Seems to have worsened over the years. Worst with exertion. She also describes times when she can hear a snore even when she is siting up awake watching TV. She believes that she is awake when this happens. She was intubated for CVA about 10 yrs ago. She has gained wt, about 50 lbs over last 2 yrs.   Spirometry 2011 showed evidence for restriction with possible superimposed obstruction and a normal diffusion capacity.   Review of Systems  Constitutional: Positive for unexpected weight change. Negative for fever.  HENT: Positive for congestion. Negative for dental problem, ear pain, nosebleeds, postnasal drip, rhinorrhea, sinus pressure, sneezing, sore throat and trouble swallowing.   Eyes: Negative for redness and itching.  Respiratory: Positive for cough and shortness of breath. Negative for chest tightness and wheezing.   Cardiovascular: Positive for palpitations. Negative for leg swelling.  Gastrointestinal: Negative for nausea and vomiting.  Genitourinary: Negative for dysuria.  Musculoskeletal: Negative for joint swelling.  Skin: Negative for rash.  Neurological: Negative for headaches.  Hematological: Does not bruise/bleed easily.  Psychiatric/Behavioral:  Negative for dysphoric mood. The patient is not nervous/anxious.     Past Medical History:  Diagnosis Date  . Anemia   . Anginal pain ([place]) 2011   had to take nitro   . Arthritis   . COPD (chronic obstructive pulmonary disease) ([place])   . Dysuria   . GERD (gastroesophageal reflux disease)   . Hematuria   . HTN (hypertension)   . Hx of echocardiogram    Echo (12/15):  Mild LVH, EF 60-65%, mild MR, mod LAE (LA 49 mm), PASP 43 mmHg  . Obesity   . OSA (obstructive sleep apnea)    cpap at home  . PAF (paroxysmal atrial fibrillation) ([place])    s/p ablation 07/29/09  . Sick sinus syndrome ([facility])    pauses with syncope  . Stroke ([place]) 6/09   NO RESIDUAL PROBLEMS     Family History  Problem Relation Age of Onset  . Breast cancer Mother   . Brain cancer Father   . Breast cancer Unknown   . Brain cancer Unknown   . Stroke Maternal Aunt   . Hypertension Sister   . Heart attack Neg Hx      Social History   Social History  . Marital status: Widowed    Spouse name: N/A  . Number of children: N/A  . Years of education: N/A   Occupational History  . Not on file.   Social History Main Topics  . Smoking status: Never Smoker  . Smokeless tobacco: Never Used  . Alcohol use No  . Drug use: No  . Sexual activity: Not on file   Other Topics Concern  . Not on file   Social History Narrative  .  No narrative on file  grew up near [employer], [place]. Husband worked w asbestos.  Worked in Scientist, research (life sciences) estate  Allergies  Allergen Reactions  . Ace Inhibitors Other (See Comments)    cough  . Albuterol Palpitations and Other (See Comments)    Atrial fibrillation  . Albuterol Sulfate Palpitations and Other (See Comments)    A. Fib  . Sulfa Antibiotics Rash     Outpatient Medications Prior to Visit  Medication Sig Dispense Refill  . amiodarone (PACERONE) 200 MG tablet Take 1/2 tablet (100 mg) by mouth once daily 45 tablet 2  . Cholecalciferol (VITAMIN D PO) Take 1 capsule by mouth  every morning.     . Coenzyme Q10 (COQ10 PO) Take 1 capsule by mouth every morning.     Marland Kitchen estradiol (ESTRACE VAGINAL) 0.1 MG/GM vaginal cream Apply pea-sized amount to vagina / urethra 2-3 x weekly to prevent labial adhesions 42.5 g 12  . furosemide (LASIX) 40 MG tablet Take 120 mg by mouth daily.    Marland Kitchen KRILL OIL OMEGA-3 PO Take 1 capsule by mouth twice daily.    . Magnesium 100 MG CAPS Take 1 capsule by mouth 2 (two) times daily.     . metoprolol tartrate (LOPRESSOR) 50 MG tablet Take 50 mg by mouth 2 (two) times daily.    . potassium chloride SA (K-DUR,KLOR-CON) 20 MEQ tablet Take 20 mEq by mouth 2 (two) times daily.     . rosuvastatin (CRESTOR) 10 MG tablet Take 10 mg by mouth daily.    . traMADol (ULTRAM) 50 MG tablet Take 1 tablet (50 mg total) by mouth every 12 (twelve) hours as needed. 8 tablet 0  . Turmeric 500 MG CAPS Take 500 mg by mouth 2 (two) times daily.    Marland Kitchen warfarin (COUMADIN) 1 MG tablet Take 3-4 mg by mouth See admin instructions. Take 4 mg by mouth on Wed Take 3 mg by mouth on Mon, Tues, Thurs, Fri, Sat, Sun     No facility-administered medications prior to visit.         Objective:   Physical Exam Vitals:   12/07/16 1612 12/07/16 1613  BP:  130/86  Pulse:  87  SpO2:  98%  Weight: 215 lb (97.5 kg)   Height: 5' (1.524 m)    Gen: Pleasant, obese woman, in no distress,  normal affect  ENT: No lesions,  mouth clear,  oropharynx clear, no postnasal drip  Neck: No JVD, no stridor  Lungs: No use of accessory muscles, clear without rales or rhonchi  Cardiovascular: RRR, heart sounds normal, no murmur or gallops, no peripheral edema  Musculoskeletal: No deformities, no cyanosis or clubbing  Neuro: alert, non focal  Skin: Warm, no lesions or rashes     Assessment & Plan:  OSA (obstructive sleep apnea) Currently on CPAP but her device is old and she has not been re-titrated in many years. I suspect that she will require a repeat split night sleep study since she  only had borderline sleep apnea on her initial study. She is going to think about the timing of when she wants to go back to the sleep lab. Continue her current CPAP for now  SHORTNESS OF BREATH (SOB) With exertion. Can also happen at times at rest with some apparent upper airway obstruction/snoring even while awake. Unclear whether there is a contribution of tracheal malacia here. This will need to be evaluated. She is obese and suspect that restrictive lung disease plus deconditioning are playing a  role here  We will perform pulmonary function testing We will arrange for a bronchoscopy to inspect your trachea and airways We will perform CT scan of the chest without contrast We may need to repeat your sleep study in the future to assess her degree of sleep apnea and to re-titrate your pressures, get a new machine. Follow with Dr Lamonte Sakai next available after your testing to discuss the results.   Tracheomalacia Suspected based on findings on her cardiac CT scan. There was esophageal enlargement and thickening with bowing of the posterior tracheal wall. She needs airway inspection. We will repeat a dedicated CT scan of the chest. Pulmonary function testing to assess for dynamic obstruction. She could be a candidate for stenting depending on the workup.  We will arrange for a bronchoscopy to inspect your trachea and airways We will perform CT scan of the chest without contrast

## 2016-12-08 ENCOUNTER — Ambulatory Visit (HOSPITAL_BASED_OUTPATIENT_CLINIC_OR_DEPARTMENT_OTHER)
Admission: RE | Admit: 2016-12-08 | Discharge: 2016-12-08 | Disposition: A | Discharge status: Home / Self Care | Payer: Medicare Other | Source: Ambulatory Visit | Attending: Emergency Medicine | Admitting: Emergency Medicine

## 2016-12-08 ENCOUNTER — Telehealth: Payer: Self-pay | Admitting: Emergency Medicine

## 2016-12-08 DIAGNOSIS — I2510 Atherosclerotic heart disease of native coronary artery without angina pectoris: Secondary | ICD-10-CM | POA: Diagnosis not present

## 2016-12-08 DIAGNOSIS — R0602 Shortness of breath: Secondary | ICD-10-CM | POA: Diagnosis not present

## 2016-12-08 DIAGNOSIS — J398 Other specified diseases of upper respiratory tract: Secondary | ICD-10-CM | POA: Diagnosis not present

## 2016-12-08 DIAGNOSIS — J9809 Other diseases of bronchus, not elsewhere classified: Secondary | ICD-10-CM | POA: Insufficient documentation

## 2016-12-08 DIAGNOSIS — I700 Atherosclerosis of aorta: Secondary | ICD-10-CM | POA: Diagnosis not present

## 2016-12-08 DIAGNOSIS — Z7901 Long term (current) use of anticoagulants: Secondary | ICD-10-CM | POA: Diagnosis not present

## 2016-12-08 NOTE — Telephone Encounter (Signed)
Pt was seen yesterday by RB. RB wanted pt scheduled for a FOB. This has been scheduled for 12/15/16 at [facility] at 7:30am. RB is aware of this. Attempted to contact pt. No answer, her voicemail has not been set up. Will try back.

## 2016-12-11 NOTE — Telephone Encounter (Signed)
Spoke with pt. She is aware of her bronch appointment date, time and location. Nothing further was needed at this time.

## 2016-12-11 NOTE — Telephone Encounter (Signed)
Attempted to contact pt x2. No answer, her voicemail has not been set up. Will try back.

## 2016-12-15 ENCOUNTER — Ambulatory Visit (HOSPITAL_COMMUNITY)
Admission: RE | Admit: 2016-12-15 | Discharge: 2016-12-15 | Disposition: A | Discharge status: Home / Self Care | Payer: Medicare Other | Source: Ambulatory Visit | Attending: Emergency Medicine | Admitting: Emergency Medicine

## 2016-12-15 ENCOUNTER — Encounter (HOSPITAL_COMMUNITY): Payer: Self-pay | Admitting: Respiratory Therapy

## 2016-12-15 ENCOUNTER — Encounter (HOSPITAL_COMMUNITY)
Admission: RE | Disposition: A | Discharge status: Home / Self Care | Payer: Self-pay | Source: Ambulatory Visit | Attending: Emergency Medicine

## 2016-12-15 DIAGNOSIS — M1990 Unspecified osteoarthritis, unspecified site: Secondary | ICD-10-CM | POA: Insufficient documentation

## 2016-12-15 DIAGNOSIS — G4733 Obstructive sleep apnea (adult) (pediatric): Secondary | ICD-10-CM | POA: Insufficient documentation

## 2016-12-15 DIAGNOSIS — Z882 Allergy status to sulfonamides status: Secondary | ICD-10-CM | POA: Diagnosis not present

## 2016-12-15 DIAGNOSIS — I495 Sick sinus syndrome: Secondary | ICD-10-CM | POA: Diagnosis not present

## 2016-12-15 DIAGNOSIS — I480 Paroxysmal atrial fibrillation: Secondary | ICD-10-CM | POA: Insufficient documentation

## 2016-12-15 DIAGNOSIS — J449 Chronic obstructive pulmonary disease, unspecified: Secondary | ICD-10-CM | POA: Insufficient documentation

## 2016-12-15 DIAGNOSIS — E669 Obesity, unspecified: Secondary | ICD-10-CM | POA: Insufficient documentation

## 2016-12-15 DIAGNOSIS — Z8673 Personal history of transient ischemic attack (TIA), and cerebral infarction without residual deficits: Secondary | ICD-10-CM | POA: Diagnosis not present

## 2016-12-15 DIAGNOSIS — J398 Other specified diseases of upper respiratory tract: Secondary | ICD-10-CM | POA: Diagnosis not present

## 2016-12-15 DIAGNOSIS — Z9989 Dependence on other enabling machines and devices: Secondary | ICD-10-CM | POA: Diagnosis not present

## 2016-12-15 DIAGNOSIS — J9809 Other diseases of bronchus, not elsewhere classified: Secondary | ICD-10-CM | POA: Diagnosis not present

## 2016-12-15 DIAGNOSIS — I10 Essential (primary) hypertension: Secondary | ICD-10-CM | POA: Insufficient documentation

## 2016-12-15 DIAGNOSIS — Z7901 Long term (current) use of anticoagulants: Secondary | ICD-10-CM | POA: Diagnosis not present

## 2016-12-15 DIAGNOSIS — Z888 Allergy status to other drugs, medicaments and biological substances status: Secondary | ICD-10-CM | POA: Insufficient documentation

## 2016-12-15 DIAGNOSIS — J384 Edema of larynx: Secondary | ICD-10-CM | POA: Insufficient documentation

## 2016-12-15 DIAGNOSIS — K219 Gastro-esophageal reflux disease without esophagitis: Secondary | ICD-10-CM | POA: Insufficient documentation

## 2016-12-15 DIAGNOSIS — R0602 Shortness of breath: Secondary | ICD-10-CM | POA: Diagnosis not present

## 2016-12-15 HISTORY — PX: VIDEO BRONCHOSCOPY: SHX5072

## 2016-12-15 SURGERY — VIDEO BRONCHOSCOPY WITHOUT FLUORO
Anesthesia: Moderate Sedation | Laterality: Bilateral

## 2016-12-15 MED ORDER — FENTANYL CITRATE (PF) 100 MCG/2ML IJ SOLN
INTRAMUSCULAR | Status: DC | PRN
  Administered 2016-12-15: 50 ug via INTRAVENOUS

## 2016-12-15 MED ORDER — MIDAZOLAM HCL 5 MG/ML IJ SOLN
INTRAMUSCULAR | Status: AC
  Filled 2016-12-15: qty 2

## 2016-12-15 MED ORDER — SODIUM CHLORIDE 0.9 % IV SOLN
INTRAVENOUS | Status: DC
  Administered 2016-12-15: 08:00:00 via INTRAVENOUS

## 2016-12-15 MED ORDER — LIDOCAINE HCL 2 % EX GEL
CUTANEOUS | Status: DC | PRN
  Administered 2016-12-15: 1

## 2016-12-15 MED ORDER — LIDOCAINE HCL 2 % EX GEL: 1.0000 "application " | Freq: Once | CUTANEOUS | Status: DC

## 2016-12-15 MED ORDER — PHENYLEPHRINE HCL 0.25 % NA SOLN: 1.0000 | Freq: Four times a day (QID) | NASAL | Status: DC | PRN

## 2016-12-15 MED ORDER — LIDOCAINE HCL 1 % IJ SOLN
INTRAMUSCULAR | Status: DC | PRN
  Administered 2016-12-15: 6 mL via RESPIRATORY_TRACT

## 2016-12-15 MED ORDER — MIDAZOLAM HCL 10 MG/2ML IJ SOLN
INTRAMUSCULAR | Status: DC | PRN
  Administered 2016-12-15: 1 mg via INTRAVENOUS
  Administered 2016-12-15: 2 mg via INTRAVENOUS

## 2016-12-15 MED ORDER — PHENYLEPHRINE HCL 0.25 % NA SOLN
NASAL | Status: DC | PRN
  Administered 2016-12-15: 2 via NASAL

## 2016-12-15 MED ORDER — FENTANYL CITRATE (PF) 100 MCG/2ML IJ SOLN
INTRAMUSCULAR | Status: AC
  Filled 2016-12-15: qty 4

## 2016-12-15 NOTE — H&P (View-Only) (Signed)
Subjective:    Patient ID: Lynn Dunn, female    DOB: Aug 25, 1930, 83 y.o.   MRN: [id number]  HPI 83 year old obese woman never smoker with a history of hypertension, obstructive sleep apnea on CPAP, atrial fibrillation with sick sinus syndrome status post ablation, stroke. Apparently she has also been given diagnosis of obstructive lung disease at some point in the past and was given albuterol - didn't tolerate. She is referred today for evaluation of suspected tracheomalacia seen on cardiac CT scan 09/2016.   I reviewed the CT chest, appears to show some posterior wall involution of the trachea. Also note that the esophageal wall appears somewhat enlarged and thickened.  She tells me that she has had longstanding dyspnea. Seems to have worsened over the years. Worst with exertion. She also describes times when she can hear a snore even when she is siting up awake watching TV. She believes that she is awake when this happens. She was intubated for CVA about 10 yrs ago. She has gained wt, about 50 lbs over last 2 yrs.   Spirometry 2011 showed evidence for restriction with possible superimposed obstruction and a normal diffusion capacity.   Review of Systems  Constitutional: Positive for unexpected weight change. Negative for fever.  HENT: Positive for congestion. Negative for dental problem, ear pain, nosebleeds, postnasal drip, rhinorrhea, sinus pressure, sneezing, sore throat and trouble swallowing.   Eyes: Negative for redness and itching.  Respiratory: Positive for cough and shortness of breath. Negative for chest tightness and wheezing.   Cardiovascular: Positive for palpitations. Negative for leg swelling.  Gastrointestinal: Negative for nausea and vomiting.  Genitourinary: Negative for dysuria.  Musculoskeletal: Negative for joint swelling.  Skin: Negative for rash.  Neurological: Negative for headaches.  Hematological: Does not bruise/bleed easily.  Psychiatric/Behavioral:  Negative for dysphoric mood. The patient is not nervous/anxious.     Past Medical History:  Diagnosis Date  . Anemia   . Anginal pain ([place]) 2011   had to take nitro   . Arthritis   . COPD (chronic obstructive pulmonary disease) ([place])   . Dysuria   . GERD (gastroesophageal reflux disease)   . Hematuria   . HTN (hypertension)   . Hx of echocardiogram    Echo (12/15):  Mild LVH, EF 60-65%, mild MR, mod LAE (LA 49 mm), PASP 43 mmHg  . Obesity   . OSA (obstructive sleep apnea)    cpap at home  . PAF (paroxysmal atrial fibrillation) ([place])    s/p ablation 07/29/09  . Sick sinus syndrome ([facility])    pauses with syncope  . Stroke ([place]) 6/09   NO RESIDUAL PROBLEMS     Family History  Problem Relation Age of Onset  . Breast cancer Mother   . Brain cancer Father   . Breast cancer Unknown   . Brain cancer Unknown   . Stroke Maternal Aunt   . Hypertension Sister   . Heart attack Neg Hx      Social History   Social History  . Marital status: Widowed    Spouse name: N/A  . Number of children: N/A  . Years of education: N/A   Occupational History  . Not on file.   Social History Main Topics  . Smoking status: Never Smoker  . Smokeless tobacco: Never Used  . Alcohol use No  . Drug use: No  . Sexual activity: Not on file   Other Topics Concern  . Not on file   Social History Narrative  .  No narrative on file  grew up near [employer], [place]. Husband worked w asbestos.  Worked in Scientist, research (life sciences) estate  Allergies  Allergen Reactions  . Ace Inhibitors Other (See Comments)    cough  . Albuterol Palpitations and Other (See Comments)    Atrial fibrillation  . Albuterol Sulfate Palpitations and Other (See Comments)    A. Fib  . Sulfa Antibiotics Rash     Outpatient Medications Prior to Visit  Medication Sig Dispense Refill  . amiodarone (PACERONE) 200 MG tablet Take 1/2 tablet (100 mg) by mouth once daily 45 tablet 2  . Cholecalciferol (VITAMIN D PO) Take 1 capsule by mouth  every morning.     . Coenzyme Q10 (COQ10 PO) Take 1 capsule by mouth every morning.     Marland Kitchen estradiol (ESTRACE VAGINAL) 0.1 MG/GM vaginal cream Apply pea-sized amount to vagina / urethra 2-3 x weekly to prevent labial adhesions 42.5 g 12  . furosemide (LASIX) 40 MG tablet Take 120 mg by mouth daily.    Marland Kitchen KRILL OIL OMEGA-3 PO Take 1 capsule by mouth twice daily.    . Magnesium 100 MG CAPS Take 1 capsule by mouth 2 (two) times daily.     . metoprolol tartrate (LOPRESSOR) 50 MG tablet Take 50 mg by mouth 2 (two) times daily.    . potassium chloride SA (K-DUR,KLOR-CON) 20 MEQ tablet Take 20 mEq by mouth 2 (two) times daily.     . rosuvastatin (CRESTOR) 10 MG tablet Take 10 mg by mouth daily.    . traMADol (ULTRAM) 50 MG tablet Take 1 tablet (50 mg total) by mouth every 12 (twelve) hours as needed. 8 tablet 0  . Turmeric 500 MG CAPS Take 500 mg by mouth 2 (two) times daily.    Marland Kitchen warfarin (COUMADIN) 1 MG tablet Take 3-4 mg by mouth See admin instructions. Take 4 mg by mouth on Wed Take 3 mg by mouth on Mon, Tues, Thurs, Fri, Sat, Sun     No facility-administered medications prior to visit.         Objective:   Physical Exam Vitals:   12/07/16 1612 12/07/16 1613  BP:  130/86  Pulse:  87  SpO2:  98%  Weight: 215 lb (97.5 kg)   Height: 5' (1.524 m)    Gen: Pleasant, obese woman, in no distress,  normal affect  ENT: No lesions,  mouth clear,  oropharynx clear, no postnasal drip  Neck: No JVD, no stridor  Lungs: No use of accessory muscles, clear without rales or rhonchi  Cardiovascular: RRR, heart sounds normal, no murmur or gallops, no peripheral edema  Musculoskeletal: No deformities, no cyanosis or clubbing  Neuro: alert, non focal  Skin: Warm, no lesions or rashes     Assessment & Plan:  OSA (obstructive sleep apnea) Currently on CPAP but her device is old and she has not been re-titrated in many years. I suspect that she will require a repeat split night sleep study since she  only had borderline sleep apnea on her initial study. She is going to think about the timing of when she wants to go back to the sleep lab. Continue her current CPAP for now  SHORTNESS OF BREATH (SOB) With exertion. Can also happen at times at rest with some apparent upper airway obstruction/snoring even while awake. Unclear whether there is a contribution of tracheal malacia here. This will need to be evaluated. She is obese and suspect that restrictive lung disease plus deconditioning are playing a  role here  We will perform pulmonary function testing We will arrange for a bronchoscopy to inspect your trachea and airways We will perform CT scan of the chest without contrast We may need to repeat your sleep study in the future to assess her degree of sleep apnea and to re-titrate your pressures, get a new machine. Follow with Dr Lamonte Sakai next available after your testing to discuss the results.   Tracheomalacia Suspected based on findings on her cardiac CT scan. There was esophageal enlargement and thickening with bowing of the posterior tracheal wall. She needs airway inspection. We will repeat a dedicated CT scan of the chest. Pulmonary function testing to assess for dynamic obstruction. She could be a candidate for stenting depending on the workup.  We will arrange for a bronchoscopy to inspect your trachea and airways We will perform CT scan of the chest without contrast

## 2016-12-15 NOTE — Progress Notes (Signed)
Video Bronchoscopy done Airway inspection done 

## 2016-12-15 NOTE — Op Note (Signed)
[facility] Cardiopulmonary Patient Name: Lynn Dunn Procedure Date: 12/15/2016 MRN: [id number] Attending MD: Collene Gobble , MD Date of Birth: [birth year] CSN: [id number] Age: 83 Admit Type: Outpatient Ethnicity: Not Hispanic or Latino Procedure:            Bronchoscopy Indications:          Tracheobronchomalacia, Shortness of breath Providers:            Collene Gobble, MD, Andre Lefort RRT,RCP, Cherre Huger RRT, RCP Referring MD:          Medicines:            Midazolam 3 mg mg IV, Fentanyl 50 mcg IV, Lidocaine 1%                        applied to cords 10 mL Complications:        No immediate complications Estimated Blood Loss: Estimated blood loss: none. Procedure:      Pre-Anesthesia Assessment:      - A History and Physical has been performed. Patient meds and allergies       have been reviewed. The risks and benefits of the procedure and the       sedation options and risks were discussed with the patient. All       questions were answered and informed consent was obtained. Patient       identification and proposed procedure were verified prior to the       procedure by the physician in the procedure room. Mental Status       Examination: alert and oriented. Airway Examination: normal       oropharyngeal airway. Respiratory Examination: bibasilar crackles. CV       Examination: regular rate and rhythm. ASA Grade Assessment: II - A       patient with mild systemic disease. After reviewing the risks and       benefits, the patient was deemed in satisfactory condition to undergo       the procedure. The anesthesia plan was to use moderate sedation /       analgesia (conscious sedation). Immediately prior to administration of       medications, the patient was re-assessed for adequacy to receive       sedatives. The heart rate, respiratory rate, oxygen saturations, blood       pressure, adequacy of pulmonary ventilation, and  response to care were       monitored throughout the procedure. The physical status of the patient       was re-assessed after the procedure.      After obtaining informed consent, the bronchoscope was passed under       direct vision. Throughout the procedure, the patient's blood pressure,       pulse, and oxygen saturations were monitored continuously. the [id number]       ([id number]) scope was introduced through the right nostril and advanced to       the tracheobronchial tree. The procedure was accomplished without       difficulty. The patient tolerated the procedure well. The total duration       of the procedure was 7 minutes. Findings:      Larynx: Laryngeal edema was visualized, most prominently diffusely,       throughout the  larynx. The edema is not obstructing the airway. Normal       cord movement with inspiration and phonation.      Trachea/Carina Abnormalities: Partially obstructing (about 80%       obstructed) proximal bronchomalacia was found in the upper trachea, in       the mid trachea and in the lower trachea extending down to the main       carina. Partially obstructing (about 90% obstructed) bronchomalacia was       found in the bilateral mainstem bronchi. Lobar airways are open       throughout and show no evidence of dynamic collapse. No secretions or       endobronchial lesions noted anywhere. Impression:      - Tracheobronchomalacia suspected based on CT chest 09/2016      - Shortness of breath      - Laryngeal edema noted on FOB without overt UA obstruction      - Bronchomalacia was visualized in the upper trachea, in the mid trachea       and in the lower trachea.      - Bronchomalacia was visualized in teh bilateral mainstem bronchi.      - No specimens collected. Moderate Sedation:      Moderate (conscious) sedation was personally administered by the       endoscopist. The following parameters were monitored: oxygen saturation,       heart rate, blood  pressure, respiratory rate, EKG, adequacy of pulmonary       ventilation, and response to care. Total physician intraservice time was       13 minutes. Recommendation:      - Consider refer to/consult with Thoracic Surgery to discuss the       therapeutic options for her bronchomalacia. If we believe that this is       primary source of her dyspnea then stenting may be an option. Will need       to optimize her CPAP at night to prevent dynamic collapse while sleeping. Procedure Code(s):      --- Professional ---      [phone], Bronchoscopy, rigid or flexible, including fluoroscopic guidance,       when performed; diagnostic, with cell washing, when performed (separate       procedure)      99152, Moderate sedation services provided by the same physician or       other qualified health care professional performing the diagnostic or       therapeutic service that the sedation supports, requiring the presence       of an independent trained observer to assist in the monitoring of the       patient's level of consciousness and physiological status; initial 15       minutes of intraservice time, patient age 64 years or older Diagnosis Code(s):      --- Professional ---      J39.8, Other specified diseases of upper respiratory tract      R06.02, Shortness of breath      J98.09, Other diseases of bronchus, not elsewhere classified CPT copyright 2016 American Medical Association. All rights reserved. The codes documented in this report are preliminary and upon coder review may  be revised to meet current compliance requirements. Collene Gobble, MD Collene Gobble, MD 12/15/2016 8:35:38 AM Number of Addenda: 0 Scope In: 4:27:06 AM Scope Out: 8:05:35 AM

## 2016-12-15 NOTE — Discharge Instructions (Signed)
Flexible Bronchoscopy, Care After These instructions give you information on caring for yourself after your procedure. Your doctor may also give you more specific instructions. Call your doctor if you have any problems or questions after your procedure. Follow these instructions at home:  Do not eat or drink anything for 2 hours after your procedure. If you try to eat or drink before the medicine wears off, food or drink could go into your lungs. You could also burn yourself.  After 2 hours have passed and when you can cough and gag normally, you may eat soft food and drink liquids slowly.  The day after the test, you may eat your normal diet.  You may do your normal activities.  Keep all doctor visits. Get help right away if:  You get more and more short of breath.  You get light-headed.  You feel like you are going to pass out (faint).  You have chest pain.  You have new problems that worry you.  You cough up more than a little blood.  You cough up more blood than before. This information is not intended to replace advice given to you by your health care provider. Make sure you discuss any questions you have with your health care provider. Document Released: 12/18/2008 Document Revised: 07/29/2015 Document Reviewed: 10/25/2012 Elsevier Interactive Patient Education  2017 [employer].  Nothing to eat or drink until  10:00      am today  12/15/2016  PLEASE CALL OUR OFFICE FOR ANY QUESTIONS OR CONCERNS, [phone].

## 2016-12-15 NOTE — Interval H&P Note (Signed)
PCCM Interval Note  Pt presents today for further evaluation of possible tracheomalacia noted on cardiac CT scan from 09/2016. All questions answered. Pt understands the risks and benefits.   Vitals:   12/15/16 0725 12/15/16 0730 12/15/16 0735 12/15/16 0740  BP: (!) 162/50     Resp: 12 19 20 16   Temp:      TempSrc:      SpO2: 100% 91% 92% 100%  Gen: Pleasant, obese woman, in no distress,  normal affect  ENT: No lesions,  mouth clear,  oropharynx clear, no postnasal drip  Neck: No JVD, no TMG, no carotid bruits  Lungs: No use of accessory muscles, no dullness to percussion, clear without rales or rhonchi  Cardiovascular: RRR, heart sounds normal, no murmur or gallops, no peripheral edema  Musculoskeletal: No deformities, no cyanosis or clubbing  Neuro: alert, non focal  Skin: Warm, no lesions or rashes    CT chest 12/08/16 --  COMPARISON:  Cardiac CT scan 09/20/2016  FINDINGS: Cardiovascular: The heart is upper limits of normal in size for age. No pericardial effusion. Prosthetic mitral valve. Moderate tortuosity, ectasia and calcification of the thoracic aorta and moderate three-vessel coronary artery calcifications. Mild pulmonary artery enlargement may suggest pulmonary hypertension. The pacer wires are in good position without complicating features.  Mediastinum/Nodes: Small scattered mediastinal and hilar lymph nodes but no mass or adenopathy. The esophagus is grossly normal.  Lungs/Pleura: No acute pulmonary findings. No edema, infiltrates or effusions. No worrisome pulmonary lesions. There are small calcified granulomas and a few calcified pleural plaques noted on the right side. No findings for emphysema: Interstitial lung disease. No bronchiectasis. Moderate calcification of the trachea and bronchi.  Upper Abdomen: No significant findings.  Musculoskeletal: No breast masses, supraclavicular or axillary adenopathy. No significant bony findings. Advanced  degenerative changes involving the thoracic spine.  IMPRESSION: 1. No acute pulmonary findings. No infiltrates, edema or effusions. No evidence of interstitial lung disease or bronchiectasis. No pulmonary lesions. 2. Advanced atherosclerotic calcifications involving the thoracic aorta and branch vessels including the coronary arteries. 3. No mediastinal or hilar mass or adenopathy.  Plans:  Proceed with Inspection FOB. Pt understands the plan, benefits, risks, elects to proceed.   Baltazar Apo, MD, PhD 12/15/2016, 7:49 AM [facility] [phone] or if no answer [phone]

## 2016-12-17 ENCOUNTER — Encounter (HOSPITAL_COMMUNITY): Payer: Self-pay | Admitting: Emergency Medicine

## 2016-12-26 ENCOUNTER — Other Ambulatory Visit: Payer: Self-pay | Admitting: *Deleted

## 2016-12-26 DIAGNOSIS — R0602 Shortness of breath: Secondary | ICD-10-CM

## 2016-12-26 MED ORDER — AMIODARONE HCL 200 MG PO TABS: ORAL_TABLET | ORAL | 0 refills | Status: DC

## 2016-12-26 MED ORDER — METOPROLOL TARTRATE 50 MG PO TABS: 50.0000 mg | ORAL_TABLET | Freq: Two times a day (BID) | ORAL | 0 refills | Status: DC

## 2016-12-27 ENCOUNTER — Other Ambulatory Visit: Payer: Self-pay | Admitting: *Deleted

## 2016-12-27 DIAGNOSIS — I779 Disorder of arteries and arterioles, unspecified: Secondary | ICD-10-CM

## 2016-12-27 DIAGNOSIS — I739 Peripheral vascular disease, unspecified: Principal | ICD-10-CM

## 2016-12-28 DIAGNOSIS — Z7901 Long term (current) use of anticoagulants: Secondary | ICD-10-CM | POA: Diagnosis not present

## 2016-12-28 DIAGNOSIS — I4891 Unspecified atrial fibrillation: Secondary | ICD-10-CM | POA: Diagnosis not present

## 2017-01-01 DIAGNOSIS — D631 Anemia in chronic kidney disease: Secondary | ICD-10-CM | POA: Diagnosis not present

## 2017-01-01 DIAGNOSIS — I4891 Unspecified atrial fibrillation: Secondary | ICD-10-CM | POA: Diagnosis not present

## 2017-01-01 DIAGNOSIS — I495 Sick sinus syndrome: Secondary | ICD-10-CM | POA: Diagnosis not present

## 2017-01-01 DIAGNOSIS — Z9989 Dependence on other enabling machines and devices: Secondary | ICD-10-CM | POA: Diagnosis not present

## 2017-01-01 DIAGNOSIS — Z8673 Personal history of transient ischemic attack (TIA), and cerebral infarction without residual deficits: Secondary | ICD-10-CM | POA: Diagnosis not present

## 2017-01-01 DIAGNOSIS — I129 Hypertensive chronic kidney disease with stage 1 through stage 4 chronic kidney disease, or unspecified chronic kidney disease: Secondary | ICD-10-CM | POA: Diagnosis not present

## 2017-01-01 DIAGNOSIS — N2581 Secondary hyperparathyroidism of renal origin: Secondary | ICD-10-CM | POA: Diagnosis not present

## 2017-01-01 DIAGNOSIS — Z7901 Long term (current) use of anticoagulants: Secondary | ICD-10-CM | POA: Diagnosis not present

## 2017-01-01 DIAGNOSIS — R319 Hematuria, unspecified: Secondary | ICD-10-CM | POA: Diagnosis not present

## 2017-01-01 DIAGNOSIS — N183 Chronic kidney disease, stage 3 (moderate): Secondary | ICD-10-CM | POA: Diagnosis not present

## 2017-01-01 DIAGNOSIS — E1122 Type 2 diabetes mellitus with diabetic chronic kidney disease: Secondary | ICD-10-CM | POA: Diagnosis not present

## 2017-01-01 DIAGNOSIS — G4733 Obstructive sleep apnea (adult) (pediatric): Secondary | ICD-10-CM | POA: Diagnosis not present

## 2017-01-03 ENCOUNTER — Other Ambulatory Visit: Payer: Self-pay | Admitting: Nephrology

## 2017-01-03 DIAGNOSIS — N183 Chronic kidney disease, stage 3 unspecified: Secondary | ICD-10-CM

## 2017-01-04 DIAGNOSIS — N183 Chronic kidney disease, stage 3 (moderate): Secondary | ICD-10-CM | POA: Diagnosis not present

## 2017-01-05 ENCOUNTER — Ambulatory Visit
Admission: RE | Admit: 2017-01-05 | Discharge: 2017-01-05 | Disposition: A | Discharge status: Home / Self Care | Payer: Medicare Other | Source: Ambulatory Visit | Attending: Nephrology | Admitting: Nephrology

## 2017-01-05 DIAGNOSIS — N189 Chronic kidney disease, unspecified: Secondary | ICD-10-CM | POA: Diagnosis not present

## 2017-01-05 DIAGNOSIS — N183 Chronic kidney disease, stage 3 unspecified: Secondary | ICD-10-CM

## 2017-01-05 DIAGNOSIS — Z7901 Long term (current) use of anticoagulants: Secondary | ICD-10-CM | POA: Diagnosis not present

## 2017-01-08 DIAGNOSIS — L821 Other seborrheic keratosis: Secondary | ICD-10-CM | POA: Diagnosis not present

## 2017-01-08 DIAGNOSIS — C44329 Squamous cell carcinoma of skin of other parts of face: Secondary | ICD-10-CM | POA: Diagnosis not present

## 2017-01-08 DIAGNOSIS — L03211 Cellulitis of face: Secondary | ICD-10-CM | POA: Diagnosis not present

## 2017-01-09 ENCOUNTER — Ambulatory Visit (HOSPITAL_COMMUNITY)
Admission: RE | Admit: 2017-01-09 | Discharge: 2017-01-09 | Disposition: A | Discharge status: Home / Self Care | Payer: Medicare Other | Source: Ambulatory Visit | Attending: Cardiovascular Disease | Admitting: Cardiovascular Disease

## 2017-01-09 DIAGNOSIS — I6523 Occlusion and stenosis of bilateral carotid arteries: Secondary | ICD-10-CM | POA: Insufficient documentation

## 2017-01-09 DIAGNOSIS — I779 Disorder of arteries and arterioles, unspecified: Secondary | ICD-10-CM | POA: Diagnosis not present

## 2017-01-09 DIAGNOSIS — I739 Peripheral vascular disease, unspecified: Secondary | ICD-10-CM

## 2017-01-18 ENCOUNTER — Ambulatory Visit (INDEPENDENT_AMBULATORY_CARE_PROVIDER_SITE_OTHER): Payer: Medicare Other | Admitting: Emergency Medicine

## 2017-01-18 ENCOUNTER — Encounter: Payer: Self-pay | Admitting: Emergency Medicine

## 2017-01-18 DIAGNOSIS — J398 Other specified diseases of upper respiratory tract: Secondary | ICD-10-CM

## 2017-01-18 DIAGNOSIS — R0600 Dyspnea, unspecified: Secondary | ICD-10-CM

## 2017-01-18 DIAGNOSIS — J984 Other disorders of lung: Secondary | ICD-10-CM | POA: Diagnosis not present

## 2017-01-18 DIAGNOSIS — G4733 Obstructive sleep apnea (adult) (pediatric): Secondary | ICD-10-CM

## 2017-01-18 LAB — PULMONARY FUNCTION TEST
DL/VA % pred: 97 %
DL/VA: 4.16 ml/min/mmHg/L
DLCO cor % pred: 55 %
DLCO cor: 10.74 ml/min/mmHg
DLCO unc % pred: 50 %
DLCO unc: 9.73 ml/min/mmHg
FEF 25-75 Post: 1.35 L/sec
FEF 25-75 Pre: 0.78 L/sec
FEF2575-%Change-Post: 71 %
FEF2575-%Pred-Post: 136 %
FEF2575-%Pred-Pre: 79 %
FEV1-%Change-Post: 11 %
FEV1-%Pred-Post: 75 %
FEV1-%Pred-Pre: 67 %
FEV1-Post: 1.09 L
FEV1-Pre: 0.97 L
FEV1FVC-%Change-Post: 12 %
FEV1FVC-%Pred-Pre: 107 %
FEV6-%Change-Post: -2 %
FEV6-%Pred-Post: 65 %
FEV6-%Pred-Pre: 66 %
FEV6-Post: 1.21 L
FEV6-Pre: 1.24 L
FEV6FVC-%Change-Post: 0 %
FEV6FVC-%Pred-Post: 106 %
FEV6FVC-%Pred-Pre: 107 %
FVC-%Change-Post: 0 %
FVC-%Pred-Post: 61 %
FVC-%Pred-Pre: 62 %
FVC-Post: 1.23 L
FVC-Pre: 1.24 L
Post FEV1/FVC ratio: 88 %
Post FEV6/FVC ratio: 99 %
Pre FEV1/FVC ratio: 78 %
Pre FEV6/FVC Ratio: 100 %
RV % pred: 98 %
RV: 2.25 L
TLC % pred: 81 %
TLC: 3.66 L

## 2017-01-18 NOTE — Addendum Note (Signed)
Addended by: Desmond Dike C on: 01/18/2017 02:02 PM   Modules accepted: Orders

## 2017-01-18 NOTE — Progress Notes (Signed)
PFT done today. 

## 2017-01-18 NOTE — Assessment & Plan Note (Signed)
Her bronchoscopy is consistent with severe bronchotracheal malacia.  Not clear to me that there is any intervention to be made to help with this.  In my experience stenting has had mixed results, is often fraught with complications.  She does have airflow obstruction on her pulmonary function testing, I suspect that this is large airway in nature as she has not responded to bronchodilators.  I offered to refer her to either thoracic surgery or interventional pulmonology to discuss the options including possible stenting if she would like to do so.  She will let me know.

## 2017-01-18 NOTE — Progress Notes (Signed)
Subjective:    Patient ID: Lynn Dunn, female    DOB: 1930/07/09, 83 y.o.   MRN: [id number]  HPI 83 year old obese woman never smoker with a history of hypertension, obstructive sleep apnea on CPAP, atrial fibrillation with sick sinus syndrome status post ablation, stroke. Apparently she has also been given diagnosis of obstructive lung disease at some point in the past and was given albuterol - didn't tolerate. She is referred today for evaluation of suspected tracheomalacia seen on cardiac CT scan 09/2016.   I reviewed the CT chest, appears to show some posterior wall involution of the trachea. Also note that the esophageal wall appears somewhat enlarged and thickened.  She tells me that she has had longstanding dyspnea. Seems to have worsened over the years. Worst with exertion. She also describes times when she can hear a snore even when she is siting up awake watching TV. She believes that she is awake when this happens. She was intubated for CVA about 10 yrs ago. She has gained wt, about 50 lbs over last 2 yrs.   Spirometry 2011 showed evidence for restriction with possible superimposed obstruction and a normal diffusion capacity.  ROV 01/18/17 --this is a follow-up visit for 83 year old woman with a history of obstructive sleep apnea (on CPAP), sick sinus syndrome and CVA.  I met her to evaluate long-standing dyspnea.  A CT scan of her chest showed some involution of the posterior wall of her trachea that was concerning for possible bronchotracheal malacia.  This prompted a bronchoscopy that was done on 10/12 and which revealed some laryngeal edema and significant partially obstructing proximal tracheomalacia that extended down into the bilateral mainstem airways.  Distal to those airways the lobar and segmental bronchi were widely patent.  There were no endobronchial lesions or abnormal secretions.  Pulmonary function testing was done on 01/18/17 that I have personally reviewed.  This  shows evidence for mixed restriction and obstruction without a significant bronchodilator response.  Her lung volumes are normal and her diffusion capacity is decreased but corrects to the normal range when adjusted for alveolar volume.   Review of Systems  Constitutional: Positive for unexpected weight change. Negative for fever.  HENT: Positive for congestion. Negative for dental problem, ear pain, nosebleeds, postnasal drip, rhinorrhea, sinus pressure, sneezing, sore throat and trouble swallowing.   Eyes: Negative for redness and itching.  Respiratory: Positive for cough and shortness of breath. Negative for chest tightness and wheezing.   Cardiovascular: Positive for palpitations. Negative for leg swelling.  Gastrointestinal: Negative for nausea and vomiting.  Genitourinary: Negative for dysuria.  Musculoskeletal: Negative for joint swelling.  Skin: Negative for rash.  Neurological: Negative for headaches.  Hematological: Does not bruise/bleed easily.  Psychiatric/Behavioral: Negative for dysphoric mood. The patient is not nervous/anxious.     Past Medical History:  Diagnosis Date  . Anemia   . Anginal pain ([place]) 2011   had to take nitro   . Arthritis   . COPD (chronic obstructive pulmonary disease) ([place])   . Dysuria   . GERD (gastroesophageal reflux disease)   . Hematuria   . HTN (hypertension)   . Hx of echocardiogram    Echo (12/15):  Mild LVH, EF 60-65%, mild MR, mod LAE (LA 49 mm), PASP 43 mmHg  . Obesity   . OSA (obstructive sleep apnea)    cpap at home  . PAF (paroxysmal atrial fibrillation) ([place])    s/p ablation 07/29/09  . Sick sinus syndrome ([place])  pauses with syncope  . Stroke ([place]) 6/09   NO RESIDUAL PROBLEMS     Family History  Problem Relation Age of Onset  . Breast cancer Mother   . Brain cancer Father   . Breast cancer Unknown   . Brain cancer Unknown   . Stroke Maternal Aunt   . Hypertension Sister   . Heart attack Neg Hx      Social History     Socioeconomic History  . Marital status: Widowed    Spouse name: Not on file  . Number of children: Not on file  . Years of education: Not on file  . Highest education level: Not on file  Social Needs  . Financial resource strain: Not on file  . Food insecurity - worry: Not on file  . Food insecurity - inability: Not on file  . Transportation needs - medical: Not on file  . Transportation needs - non-medical: Not on file  Occupational History  . Not on file  Tobacco Use  . Smoking status: Never Smoker  . Smokeless tobacco: Never Used  Substance and Sexual Activity  . Alcohol use: No  . Drug use: No  . Sexual activity: Not on file  Other Topics Concern  . Not on file  Social History Narrative  . Not on file  grew up near [employer], [employer] fund[place]. Husband worked w asbestos.  Worked in Scientist, research (life sciences) estate  Allergies  Allergen Reactions  . Ace Inhibitors Other (See Comments)    cough  . Albuterol Palpitations and Other (See Comments)    Atrial fibrillation  . Albuterol Sulfate Palpitations and Other (See Comments)    A. Fib  . Sulfa Antibiotics Rash     Outpatient Medications Prior to Visit  Medication Sig Dispense Refill  . amiodarone (PACERONE) 200 MG tablet Take 1/2 tablet (100 mg) by mouth once daily 2 tablet 0  . Cholecalciferol (VITAMIN D PO) Take 1 capsule by mouth every morning.     . Coenzyme Q10 (COQ10 PO) Take 1 capsule by mouth every morning.     Marland Kitchen estradiol (ESTRACE VAGINAL) 0.1 MG/GM vaginal cream Apply pea-sized amount to vagina / urethra 2-3 x weekly to prevent labial adhesions 42.5 g 12  . furosemide (LASIX) 40 MG tablet Take 120 mg by mouth daily.    Marland Kitchen KRILL OIL OMEGA-3 PO Take 1 capsule by mouth twice daily.    . Magnesium 100 MG CAPS Take 1 capsule by mouth 2 (two) times daily.     . metoprolol tartrate (LOPRESSOR) 50 MG tablet Take 1 tablet (50 mg total) by mouth 2 (two) times daily. 2 tablet 0  . potassium chloride SA (K-DUR,KLOR-CON) 20 MEQ tablet Take 20  mEq by mouth 2 (two) times daily.     . rosuvastatin (CRESTOR) 10 MG tablet Take 10 mg by mouth daily.    . Turmeric 500 MG CAPS Take 500 mg by mouth 2 (two) times daily.    Marland Kitchen warfarin (COUMADIN) 1 MG tablet Take 3-4 mg by mouth See admin instructions. Take 4 mg by mouth on Wed Take 3 mg by mouth on Mon, Tues, Thurs, Fri, Sat, Sun     No facility-administered medications prior to visit.         Objective:   Physical Exam Vitals:   01/18/17 1322 01/18/17 1325  BP:  128/66  Pulse:  61  SpO2:  97%  Weight: 217 lb (98.4 kg)   Height: 5' 0.25" (1.53 m)    Gen: Pleasant,  obese woman, in no distress,  normal affect  ENT: No lesions,  mouth clear,  oropharynx clear, no postnasal drip  Neck: No JVD, no stridor  Lungs: No use of accessory muscles, clear without rales or rhonchi  Cardiovascular: RRR, heart sounds normal, no murmur or gallops, no peripheral edema  Musculoskeletal: No deformities, no cyanosis or clubbing  Neuro: alert, non focal  Skin: Warm, no lesions or rashes     Assessment & Plan:  Tracheomalacia Her bronchoscopy is consistent with severe bronchotracheal malacia.  Not clear to me that there is any intervention to be made to help with this.  In my experience stenting has had mixed results, is often fraught with complications.  She does have airflow obstruction on her pulmonary function testing, I suspect that this is large airway in nature as she has not responded to bronchodilators.  I offered to refer her to either thoracic surgery or interventional pulmonology to discuss the options including possible stenting if she would like to do so.  She will let me know.  OSA (obstructive sleep apnea) Continue CPAP as ordered  Restrictive lung disease I discussed with her the benefits of weight loss, diet, exercise.  Baltazar Apo, MD, PhD 01/18/2017, 1:57 PM [facility] [phone] or if no answer [phone]

## 2017-01-18 NOTE — Patient Instructions (Signed)
Your bronchoscopy shows evidence for a problem called bronchotracheal malacia, and abnormal collapse of the large airways while you are breathing.  Your pulmonary function testing also shows abnormal airflow consistent with this problem. It is unclear whether there are any interventions to make to help keep the airways open, but sometimes efforts are made using stents.  It may be reasonable for you to discuss this option with either an interventional pulmonologist or a thoracic surgeon to learn about its potential benefits and problems.  Please call us and let us know if you would like a referral to learn more about the options. Start to work on slow steady weight loss through diet and exercise. Continue your CPAP every night Follow with Dr Lamonte Sakai if needed for any changes in your breathing

## 2017-01-18 NOTE — Assessment & Plan Note (Signed)
I discussed with her the benefits of weight loss, diet, exercise.

## 2017-01-18 NOTE — Assessment & Plan Note (Signed)
Continue CPAP as ordered 

## 2017-01-29 DIAGNOSIS — Z8673 Personal history of transient ischemic attack (TIA), and cerebral infarction without residual deficits: Secondary | ICD-10-CM | POA: Diagnosis not present

## 2017-01-29 DIAGNOSIS — D631 Anemia in chronic kidney disease: Secondary | ICD-10-CM | POA: Diagnosis not present

## 2017-01-29 DIAGNOSIS — Z7901 Long term (current) use of anticoagulants: Secondary | ICD-10-CM | POA: Diagnosis not present

## 2017-01-29 DIAGNOSIS — R319 Hematuria, unspecified: Secondary | ICD-10-CM | POA: Diagnosis not present

## 2017-01-29 DIAGNOSIS — Z950 Presence of cardiac pacemaker: Secondary | ICD-10-CM | POA: Diagnosis not present

## 2017-01-29 DIAGNOSIS — N183 Chronic kidney disease, stage 3 (moderate): Secondary | ICD-10-CM | POA: Diagnosis not present

## 2017-01-29 DIAGNOSIS — I129 Hypertensive chronic kidney disease with stage 1 through stage 4 chronic kidney disease, or unspecified chronic kidney disease: Secondary | ICD-10-CM | POA: Diagnosis not present

## 2017-01-29 DIAGNOSIS — E1122 Type 2 diabetes mellitus with diabetic chronic kidney disease: Secondary | ICD-10-CM | POA: Diagnosis not present

## 2017-01-29 DIAGNOSIS — Z9989 Dependence on other enabling machines and devices: Secondary | ICD-10-CM | POA: Diagnosis not present

## 2017-01-29 DIAGNOSIS — N2581 Secondary hyperparathyroidism of renal origin: Secondary | ICD-10-CM | POA: Diagnosis not present

## 2017-01-29 DIAGNOSIS — I4891 Unspecified atrial fibrillation: Secondary | ICD-10-CM | POA: Diagnosis not present

## 2017-01-29 DIAGNOSIS — G4733 Obstructive sleep apnea (adult) (pediatric): Secondary | ICD-10-CM | POA: Diagnosis not present

## 2017-01-30 DIAGNOSIS — I4891 Unspecified atrial fibrillation: Secondary | ICD-10-CM | POA: Diagnosis not present

## 2017-01-30 DIAGNOSIS — Z7901 Long term (current) use of anticoagulants: Secondary | ICD-10-CM | POA: Diagnosis not present

## 2017-02-02 DIAGNOSIS — R05 Cough: Secondary | ICD-10-CM | POA: Diagnosis not present

## 2017-02-06 ENCOUNTER — Telehealth: Payer: Self-pay | Admitting: Internal Medicine

## 2017-02-06 DIAGNOSIS — J398 Other specified diseases of upper respiratory tract: Secondary | ICD-10-CM

## 2017-02-06 NOTE — Telephone Encounter (Signed)
I would like to refer her to Interventional Pulmonology at [facility].  Drs. Wahidi, Shofer, [place], Gionivissi

## 2017-02-06 NOTE — Telephone Encounter (Signed)
Your bronchoscopy shows evidence for a problem called bronchotracheal malacia, and abnormal collapse of the large airways while you are breathing.  Your pulmonary function testing also shows abnormal airflow consistent with this problem. It is unclear whether there are any interventions to make to help keep the airways open, but sometimes efforts are made using stents.  It may be reasonable for you to discuss this option with either an interventional pulmonologist or a thoracic surgeon to learn about its potential benefits and problems.  Please call us and let us know if you would like a referral to learn more about the options. Start to work on slow steady weight loss through diet and exercise. Continue your CPAP every night Follow with Dr Lamonte Sakai if needed for any changes in your breathing   RB please advise on who you would like to refer the pt to.  Pt would like this to be done.  thanks

## 2017-02-07 NOTE — Telephone Encounter (Signed)
Spoke with the pt and notified of recs per RB  She verbalized understanding Referral was sent

## 2017-02-19 DIAGNOSIS — I4891 Unspecified atrial fibrillation: Secondary | ICD-10-CM | POA: Diagnosis not present

## 2017-02-19 DIAGNOSIS — Z7901 Long term (current) use of anticoagulants: Secondary | ICD-10-CM | POA: Diagnosis not present

## 2017-02-21 ENCOUNTER — Encounter: Payer: Self-pay | Admitting: Emergency Medicine

## 2017-02-28 ENCOUNTER — Ambulatory Visit (INDEPENDENT_AMBULATORY_CARE_PROVIDER_SITE_OTHER): Payer: Medicare Other | Admitting: *Deleted

## 2017-02-28 ENCOUNTER — Telehealth: Payer: Self-pay | Admitting: Cardiology

## 2017-02-28 DIAGNOSIS — I495 Sick sinus syndrome: Secondary | ICD-10-CM | POA: Diagnosis not present

## 2017-02-28 NOTE — Telephone Encounter (Signed)
Spoke with pt and reminded pt of remote transmission that is due today. Pt verbalized understanding.   

## 2017-03-01 ENCOUNTER — Encounter: Payer: Self-pay | Admitting: Cardiology

## 2017-03-01 LAB — CUP PACEART REMOTE DEVICE CHECK
Brady Statistic AP VP Percent: 0.08 %
Brady Statistic AP VS Percent: 76.76 %
Brady Statistic AS VP Percent: 0.04 %
Brady Statistic RA Percent Paced: 76.84 %
Brady Statistic RV Percent Paced: 0.12 %
Implantable Lead Implant Date: 20161214
Implantable Lead Location: 753859
Implantable Lead Location: 753860
Implantable Lead Model: 5076
Implantable Lead Model: 5076
Lead Channel Impedance Value: 456 Ohm
Lead Channel Pacing Threshold Pulse Width: 0.4 ms
Lead Channel Sensing Intrinsic Amplitude: 12.625 mV
Lead Channel Sensing Intrinsic Amplitude: 12.625 mV
Lead Channel Setting Pacing Amplitude: 2 V
Lead Channel Setting Pacing Amplitude: 2.5 V
Lead Channel Setting Pacing Pulse Width: 0.4 ms
MDC IDC LEAD IMPLANT DT: 20161214
MDC IDC MSMT BATTERY REMAINING LONGEVITY: 106 mo
MDC IDC MSMT BATTERY VOLTAGE: 3.02 V
MDC IDC MSMT LEADCHNL RA IMPEDANCE VALUE: 380 Ohm
MDC IDC MSMT LEADCHNL RA IMPEDANCE VALUE: 513 Ohm
MDC IDC MSMT LEADCHNL RA PACING THRESHOLD AMPLITUDE: 1 V
MDC IDC MSMT LEADCHNL RA SENSING INTR AMPL: 2.75 mV
MDC IDC MSMT LEADCHNL RA SENSING INTR AMPL: 2.75 mV
MDC IDC MSMT LEADCHNL RV IMPEDANCE VALUE: 494 Ohm
MDC IDC MSMT LEADCHNL RV PACING THRESHOLD AMPLITUDE: 0.875 V
MDC IDC MSMT LEADCHNL RV PACING THRESHOLD PULSEWIDTH: 0.4 ms
MDC IDC PG IMPLANT DT: 20161214
MDC IDC SESS DTM: 20181227151055
MDC IDC SET LEADCHNL RV SENSING SENSITIVITY: 2.8 mV
MDC IDC STAT BRADY AS VS PERCENT: 23.12 %

## 2017-03-01 NOTE — Progress Notes (Signed)
Remote pacemaker transmission.   

## 2017-03-11 DIAGNOSIS — J189 Pneumonia, unspecified organism: Secondary | ICD-10-CM | POA: Diagnosis not present

## 2017-03-11 DIAGNOSIS — J209 Acute bronchitis, unspecified: Secondary | ICD-10-CM | POA: Diagnosis not present

## 2017-03-13 DIAGNOSIS — G4733 Obstructive sleep apnea (adult) (pediatric): Secondary | ICD-10-CM | POA: Diagnosis not present

## 2017-03-13 DIAGNOSIS — J398 Other specified diseases of upper respiratory tract: Secondary | ICD-10-CM | POA: Diagnosis not present

## 2017-03-16 DIAGNOSIS — J398 Other specified diseases of upper respiratory tract: Secondary | ICD-10-CM | POA: Diagnosis not present

## 2017-03-16 DIAGNOSIS — J441 Chronic obstructive pulmonary disease with (acute) exacerbation: Secondary | ICD-10-CM | POA: Diagnosis not present

## 2017-03-16 DIAGNOSIS — I10 Essential (primary) hypertension: Secondary | ICD-10-CM | POA: Diagnosis not present

## 2017-03-16 DIAGNOSIS — G4733 Obstructive sleep apnea (adult) (pediatric): Secondary | ICD-10-CM | POA: Diagnosis not present

## 2017-03-16 DIAGNOSIS — E785 Hyperlipidemia, unspecified: Secondary | ICD-10-CM | POA: Diagnosis not present

## 2017-03-16 DIAGNOSIS — E559 Vitamin D deficiency, unspecified: Secondary | ICD-10-CM | POA: Diagnosis not present

## 2017-03-16 DIAGNOSIS — I480 Paroxysmal atrial fibrillation: Secondary | ICD-10-CM | POA: Diagnosis not present

## 2017-03-16 DIAGNOSIS — N183 Chronic kidney disease, stage 3 (moderate): Secondary | ICD-10-CM | POA: Diagnosis not present

## 2017-03-16 DIAGNOSIS — Z7901 Long term (current) use of anticoagulants: Secondary | ICD-10-CM | POA: Diagnosis not present

## 2017-03-16 DIAGNOSIS — Z8673 Personal history of transient ischemic attack (TIA), and cerebral infarction without residual deficits: Secondary | ICD-10-CM | POA: Diagnosis not present

## 2017-03-16 DIAGNOSIS — J449 Chronic obstructive pulmonary disease, unspecified: Secondary | ICD-10-CM | POA: Diagnosis not present

## 2017-03-16 DIAGNOSIS — E1122 Type 2 diabetes mellitus with diabetic chronic kidney disease: Secondary | ICD-10-CM | POA: Diagnosis not present

## 2017-03-26 NOTE — Progress Notes (Signed)
Cardiology  Note:    Date:  03/27/2017   ID:  Lynn Dunn, DOB 02/01/1931, MRN [id number]  PCP:  [name], MD  Cardiologist:  No primary care provider on file.    Referring MD: [name], MD   Chief Complaint  Patient presents with  . Sleep Apnea  . Hypertension   Lynn Dunn is a 84 y.o. female who is being seen today for the evaluation of OSA at the request of [name], MD.  History of Present Illness:    Lynn Dunn is a 84 y.o. female with a hx of OSA on CPAP, obesity, PAF and HTN.  She has not been seen by a sleep MD in several years but she is doing well with her CPAP device.  She tolerates the nasal mask with no chin starp and feels the pressure is adequate.  She does not sleep well at night and wakes up so she does not always feel rested in the am but has no significant daytime sleepiness.  She has problems with mouth dryness.  She does not know if she snores.     Past Medical History:  Diagnosis Date  . Anemia   . Anginal pain ([place]) 2011   had to take nitro   . Arthritis   . COPD (chronic obstructive pulmonary disease) ([place])   . Dysuria   . GERD (gastroesophageal reflux disease)   . Hematuria   . HTN (hypertension)   . Hx of echocardiogram    Echo (12/15):  Mild LVH, EF 60-65%, mild MR, mod LAE (LA 49 mm), PASP 43 mmHg  . Obesity   . OSA (obstructive sleep apnea)    cpap at home  . PAF (paroxysmal atrial fibrillation) ([place])    s/p ablation 07/29/09  . Sick sinus syndrome ([facility])    pauses with syncope  . Stroke ([place]) 6/09   NO RESIDUAL PROBLEMS    Past Surgical History:  Procedure Laterality Date  . ABDOMINAL HYSTERECTOMY    . ATRIAL ABLATION SURGERY     s/p ablation for afib 07/29/09  . CYSTOSCOPY N/A 06/23/2015   Procedure: CYSTOSCOPY;  Surgeon: Alexis Frock, MD;  Location: [facility];  Service: Urology;  Laterality: N/A;  . EP IMPLANTABLE DEVICE N/A 02/17/2015   MDT Advisa MRI PPM implanted by Dr Lovena Le for sick sinus and  syncope  . EP IMPLANTABLE DEVICE N/A 02/17/2015   Procedure: Loop Recorder Removal;  Surgeon: Evans Lance, MD;  Location: [facility];  Service: Cardiovascular;  Laterality: N/A;  . ESOPHAGOGASTRODUODENOSCOPY  01/24/2012   Procedure: ESOPHAGOGASTRODUODENOSCOPY (EGD);  Surgeon: Jeryl Columbia, MD;  Location[facility] ENDOSCOPY;  Service: Endoscopy;  Laterality: N/A;  . KNEE ARTHROSCOPY     BILATERAL  . LOOP RECORDER IMPLANT N/A 03/12/2014   Procedure: LOOP RECORDER IMPLANT;  Surgeon: Thompson Grayer, MD;  Location: [facility] CATH LAB;  Service: Cardiovascular;  Laterality: N/A;  . LYSIS OF ADHESION N/A 06/23/2015   Procedure: LYSIS OF LABIAL ADHESION;  Surgeon: Alexis Frock, MD;  Location: [facility];  Service: Urology;  Laterality: N/A;  . VIDEO BRONCHOSCOPY Bilateral 12/15/2016   Procedure: VIDEO BRONCHOSCOPY WITHOUT FLUORO;  Surgeon: Collene Gobble, MD;  Location: [facility] ENDOSCOPY;  Service: Cardiopulmonary;  Laterality: Bilateral;    Current Medications: Current Meds  Medication Sig  . amiodarone (PACERONE) 200 MG tablet Take 1/2 tablet (100 mg) by mouth once daily  . Cholecalciferol (VITAMIN D PO) Take 1 capsule by mouth every morning.   Marland Kitchen  Coenzyme Q10 (COQ10 PO) Take 1 capsule by mouth every morning.   Marland Kitchen estradiol (ESTRACE VAGINAL) 0.1 MG/GM vaginal cream Apply pea-sized amount to vagina / urethra 2-3 x weekly to prevent labial adhesions  . furosemide (LASIX) 40 MG tablet Take 120 mg by mouth daily.  Marland Kitchen KRILL OIL OMEGA-3 PO Take 1 capsule by mouth twice daily.  . Magnesium 100 MG CAPS Take 1 capsule by mouth 2 (two) times daily.   . metoprolol tartrate (LOPRESSOR) 50 MG tablet Take 1 tablet (50 mg total) by mouth 2 (two) times daily.  . potassium chloride SA (K-DUR,KLOR-CON) 20 MEQ tablet Take 20 mEq by mouth 2 (two) times daily.   . rosuvastatin (CRESTOR) 10 MG tablet Take 10 mg by mouth daily.  . Turmeric 500 MG CAPS Take 500 mg by mouth 2 (two) times daily.  Marland Kitchen warfarin (COUMADIN) 1 MG tablet Take  3-4 mg by mouth See admin instructions. Take 4 mg by mouth on Wed Take 3 mg by mouth on Mon, Tues, Thurs, Fri, Sat, Sun     Allergies:   Ace inhibitors; Albuterol; Albuterol sulfate; and Sulfa antibiotics   Social History   Socioeconomic History  . Marital status: Widowed    Spouse name: None  . Number of children: None  . Years of education: None  . Highest education level: None  Social Needs  . Financial resource strain: None  . Food insecurity - worry: None  . Food insecurity - inability: None  . Transportation needs - medical: None  . Transportation needs - non-medical: None  Occupational History  . None  Tobacco Use  . Smoking status: Never Smoker  . Smokeless tobacco: Never Used  Substance and Sexual Activity  . Alcohol use: No  . Drug use: No  . Sexual activity: None  Other Topics Concern  . None  Social History Narrative  . None     Family History: The patient's family history includes Brain cancer in her father and unknown relative; Breast cancer in her mother and unknown relative; Hypertension in her sister; Stroke in her maternal aunt. There is no history of Heart attack.  ROS:   Please see the history of present illness.    ROS  All other systems reviewed and negative.   EKGs/Labs/Other Studies Reviewed:    The following studies were reviewed today: CPAP download  EKG:  EKG is not ordered today.  Recent Labs: 04/05/2016: TSH 2.254 06/05/2016: Magnesium 2.3 09/30/2016: ALT 15; BUN 21; Creatinine, Ser 1.30; Hemoglobin 11.4; Platelets 212; Potassium 4.5; Sodium 138   Recent Lipid Panel    Component Value Date/Time   CHOL 170 08/31/2014 0941   TRIG 162.0 (H) 08/31/2014 0941   HDL 51.80 08/31/2014 0941   CHOLHDL 3 08/31/2014 0941   VLDL 32.4 08/31/2014 0941   LDLCALC 86 08/31/2014 0941    Physical Exam:    VS:  BP 138/78   Pulse 87   Ht 5\' 1"  (1.549 m)   Wt 210 lb 8 oz (95.5 kg)   LMP  (LMP Unknown)   SpO2 96%   BMI 39.77 kg/m     Wt  Readings from Last 3 Encounters:  03/27/17 210 lb 8 oz (95.5 kg)  01/18/17 217 lb (98.4 kg)  12/07/16 215 lb (97.5 kg)     GEN:  Well nourished, well developed in no acute distress HEENT: Normal NECK: No JVD; No carotid bruits LYMPHATICS: No lymphadenopathy CARDIAC: RRR, no murmurs, rubs, gallops RESPIRATORY:  Clear to auscultation without  rales, wheezing or rhonchi  ABDOMEN: Soft, non-tender, non-distended MUSCULOSKELETAL:  No edema; No deformity  SKIN: Warm and dry NEUROLOGIC:  Alert and oriented x 3 PSYCHIATRIC:  Normal affect   ASSESSMENT:    1. OSA (obstructive sleep apnea)   2. Essential hypertension, benign   3. Class 2 severe obesity due to excess calories with serious comorbidity in adult, unspecified BMI ([place])    PLAN:    In order of problems listed above:  1.  OSA - the patient is tolerating PAP therapy well without any problems. The PAP download was reviewed today and showed an AHI of 2.3/hr on 15 cm H2O with 93% compliance in using more than 4 hours nightly.  The patient has been using and benefiting from PAP use and will continue to benefit from therapy. I will add a chin strap to see if that helps with her dry mouth.  2.  HTN - BP is well controlled one exam today.  She will continue on Lopressor 50mg  BID.   3.  Obesity - She has SOB from tracheomalacia and therefore this limits her ability to exercise.     Medication Adjustments/Labs and Tests Ordered: Current medicines are reviewed at length with the patient today.  Concerns regarding medicines are outlined above.  No orders of the defined types were placed in this encounter.  No orders of the defined types were placed in this encounter.   Signed, Fransico Him, MD  03/27/2017 8:10 AM    [employer] [place]

## 2017-03-27 ENCOUNTER — Ambulatory Visit (INDEPENDENT_AMBULATORY_CARE_PROVIDER_SITE_OTHER): Payer: Medicare Other | Admitting: Cardiology

## 2017-03-27 ENCOUNTER — Encounter: Payer: Self-pay | Admitting: Cardiology

## 2017-03-27 VITALS — BP 138/78 | HR 87 | Ht 61.0 in | Wt 210.5 lb

## 2017-03-27 DIAGNOSIS — G4733 Obstructive sleep apnea (adult) (pediatric): Secondary | ICD-10-CM | POA: Diagnosis not present

## 2017-03-27 DIAGNOSIS — I10 Essential (primary) hypertension: Secondary | ICD-10-CM | POA: Diagnosis not present

## 2017-03-27 NOTE — Patient Instructions (Signed)

## 2017-03-28 ENCOUNTER — Telehealth: Payer: Self-pay | Admitting: *Deleted

## 2017-03-28 ENCOUNTER — Telehealth: Payer: Self-pay | Admitting: Cardiology

## 2017-03-28 NOTE — Telephone Encounter (Signed)
Returned call to patient. Patient stated she received a call from this number. Informed patient that order for chin strap was placed for [facility]. Patient verbalized understanding and thanked me for this call.

## 2017-03-28 NOTE — Telephone Encounter (Signed)
New message ° °Pt verbalized that she is returning call for RN °

## 2017-03-28 NOTE — Telephone Encounter (Signed)
Order placed today to [facility] via community message.Marland Kitchen

## 2017-03-28 NOTE — Telephone Encounter (Signed)
-----   Message from Teressa Senter, RN sent at 03/27/2017  8:17 AM EST ----- Regarding: dme order DME order placed for chin strap.   Thanks  [employer]

## 2017-04-02 DIAGNOSIS — R946 Abnormal results of thyroid function studies: Secondary | ICD-10-CM | POA: Diagnosis not present

## 2017-04-02 DIAGNOSIS — I480 Paroxysmal atrial fibrillation: Secondary | ICD-10-CM | POA: Diagnosis not present

## 2017-04-02 DIAGNOSIS — Z7901 Long term (current) use of anticoagulants: Secondary | ICD-10-CM | POA: Diagnosis not present

## 2017-05-07 DIAGNOSIS — Z7901 Long term (current) use of anticoagulants: Secondary | ICD-10-CM | POA: Diagnosis not present

## 2017-05-14 ENCOUNTER — Other Ambulatory Visit: Payer: Self-pay | Admitting: Internal Medicine

## 2017-05-21 NOTE — Progress Notes (Signed)
Electrophysiology Office Note Date: 05/23/2017  ID:  Lynn, Dunn [birth year], MRN [id number]  PCP: Lynn Melter, MD Primary Cardiologist: Lynn Dunn Electrophysiologist: Lynn Dunn  CC: Pacemaker follow-up  Lynn Dunn is a 84 y.o. female seen today for Dr Lynn Dunn.  She presents today for routine electrophysiology followup.  Since last being seen in our clinic, the patient reports doing reasonably well.  She has stable shortness of breath with exertion. She has been seen by pulmonary as well as [facility] for tracheomalacia. Review of [facility] notes show plan for OSA treatment and weight loss with follow up in 6 months to review need for stenting.  We reviewed cardiac CT with FFR today which demonstrated no significant stenosis.  She remains frustrated with her shortness of breath and is not convinced it is all from tracheomalacia.  She denies chest pain, palpitations, PND, orthopnea, nausea, vomiting, dizziness, syncope, weight gain, or early satiety.  Device History: MDT dual chamber PPM implanted 2016 for SSS   Past Medical History:  Diagnosis Date  . Anemia   . Anginal pain ([place]) 2011   had to take nitro   . Arthritis   . COPD (chronic obstructive pulmonary disease) ([place])   . Dysuria   . GERD (gastroesophageal reflux disease)   . Hematuria   . HTN (hypertension)   . Hx of echocardiogram    Echo (12/15):  Mild LVH, EF 60-65%, mild MR, mod LAE (LA 49 mm), PASP 43 mmHg  . Obesity   . OSA (obstructive sleep apnea)    cpap at home  . PAF (paroxysmal atrial fibrillation) ([place])    s/p ablation 07/29/09  . Sick sinus syndrome ([facility])    pauses with syncope  . Stroke ([place]) 6/09   NO RESIDUAL PROBLEMS   Past Surgical History:  Procedure Laterality Date  . ABDOMINAL HYSTERECTOMY    . ATRIAL ABLATION SURGERY     s/p ablation for afib 07/29/09  . CYSTOSCOPY N/A 06/23/2015   Procedure: CYSTOSCOPY;  Surgeon: Alexis Frock, MD;  Location: [facility];  Service: Urology;  Laterality: N/A;  .  EP IMPLANTABLE DEVICE N/A 02/17/2015   MDT Advisa MRI PPM implanted by Dr Lovena Le for sick sinus and syncope  . EP IMPLANTABLE DEVICE N/A 02/17/2015   Procedure: Loop Recorder Removal;  Surgeon: Evans Lance, MD;  Location: [place];  Service: Cardiovascular;  Laterality: N/A;  . ESOPHAGOGASTRODUODENOSCOPY  01/24/2012   Procedure: ESOPHAGOGASTRODUODENOSCOPY (EGD);  Surgeon: Jeryl Columbia, MD;  Location: [facility];  Service: Endoscopy;  Laterality: N/A;  . KNEE ARTHROSCOPY     BILATERAL  . LOOP RECORDER IMPLANT N/A 03/12/2014   Procedure: LOOP RECORDER IMPLANT;  Surgeon: Thompson Grayer, MD;  Location: [facility] CATH LAB;  Service: Cardiovascular;  Laterality: N/A;  . LYSIS OF ADHESION N/A 06/23/2015   Procedure: LYSIS OF LABIAL ADHESION;  Surgeon: Alexis Frock, MD;  Location: [facility];  Service: Urology;  Laterality: N/A;  . VIDEO BRONCHOSCOPY Bilateral 12/15/2016   Procedure: VIDEO BRONCHOSCOPY WITHOUT FLUORO;  Surgeon: Collene Gobble, MD;  Location: [facility] ENDOSCOPY;  Service: Cardiopulmonary;  Laterality: Bilateral;    Current Outpatient Medications  Medication Sig Dispense Refill  . amiodarone (PACERONE) 200 MG tablet TAKE 1/2 TABLET ONE TIME DAILY 45 tablet 1  . calcitRIOL (ROCALTROL) 0.25 MCG capsule Take 1 capsule by mouth daily.    . Cholecalciferol (VITAMIN D PO) Take 1 capsule by mouth every morning.     . Coenzyme Q10 (COQ10 PO) Take 1 capsule  by mouth every morning.     Marland Kitchen estradiol (ESTRACE VAGINAL) 0.1 MG/GM vaginal cream Apply pea-sized amount to vagina / urethra 2-3 x weekly to prevent labial adhesions 42.5 g 12  . furosemide (LASIX) 40 MG tablet Take 80 mg by mouth daily.     Marland Kitchen KRILL OIL OMEGA-3 PO Take 1 capsule by mouth twice daily.    Marland Kitchen levothyroxine (SYNTHROID, LEVOTHROID) 25 MCG tablet Take 25 mcg by mouth daily before breakfast.    . Magnesium 100 MG CAPS Take 1 capsule by mouth 2 (two) times daily.     . metoprolol tartrate (LOPRESSOR) 25 MG tablet Take 25 mg by mouth 2  (two) times daily.    . potassium chloride SA (K-DUR,KLOR-CON) 20 MEQ tablet Take 20 mEq by mouth 2 (two) times daily.     . rosuvastatin (CRESTOR) 10 MG tablet Take 10 mg by mouth daily.    . Turmeric 500 MG CAPS Take 500 mg by mouth 2 (two) times daily.    Marland Kitchen warfarin (COUMADIN) 1 MG tablet Take 3-4 mg by mouth See admin instructions. Take 4 mg by mouth on Wed Take 3 mg by mouth on Mon, Tues, Thurs, Fri, Sat, Sun     No current facility-administered medications for this visit.     Allergies:   Ace inhibitors; Albuterol; Albuterol sulfate; and Sulfa antibiotics   Social History: Social History   Socioeconomic History  . Marital status: Widowed    Spouse name: Not on file  . Number of children: Not on file  . Years of education: Not on file  . Highest education level: Not on file  Social Needs  . Financial resource strain: Not on file  . Food insecurity - worry: Not on file  . Food insecurity - inability: Not on file  . Transportation needs - medical: Not on file  . Transportation needs - non-medical: Not on file  Occupational History  . Not on file  Tobacco Use  . Smoking status: Never Smoker  . Smokeless tobacco: Never Used  Substance and Sexual Activity  . Alcohol use: No  . Drug use: No  . Sexual activity: Not on file  Other Topics Concern  . Not on file  Social History Narrative  . Not on file    Family History: Family History  Problem Relation Age of Onset  . Breast cancer Mother   . Brain cancer Father   . Breast cancer Unknown   . Brain cancer Unknown   . Stroke Maternal Aunt   . Hypertension Sister   . Heart attack Neg Hx      Review of Systems: All other systems reviewed and are otherwise negative except as noted above.   Physical Exam: VS:  BP 134/62   Pulse 69   Ht 5\' 1"  (1.549 m)   Wt 207 lb (93.9 kg)   LMP  (LMP Unknown)   SpO2 98%   BMI 39.11 kg/m  , BMI Body mass index is 39.11 kg/m.  GEN- The patient is elderly and obese appearing,  alert and oriented x 3 today.   HEENT: normocephalic, atraumatic; sclera clear, conjunctiva pink; hearing intact; oropharynx clear; neck supple  Lungs- Clear to ausculation bilaterally, normal work of breathing.  No wheezes, rales, rhonchi Heart- Regular rate and rhythm  GI- soft, non-tender, non-distended, bowel sounds present  Extremities- no clubbing, cyanosis, 1+ BLE edema  MS- no significant deformity or atrophy Skin- warm and dry, no rash or lesion; PPM pocket well healed  Psych- euthymic mood, full affect Neuro- strength and sensation are intact  PPM Interrogation- reviewed in detail today,  See PACEART report  EKG:  EKG is not ordered today.  Recent Labs: 06/05/2016: Magnesium 2.3 09/30/2016: ALT 15; BUN 21; Creatinine, Ser 1.30; Hemoglobin 11.4; Platelets 212; Potassium 4.5; Sodium 138   Wt Readings from Last 3 Encounters:  05/23/17 207 lb (93.9 kg)  03/27/17 210 lb 8 oz (95.5 kg)  01/18/17 217 lb (98.4 kg)     Other studies Reviewed: Additional studies/ records that were reviewed today include: Dr Lynn Dunn and cardiology office notes  Assessment and Plan:  1.  Symptomatic bradycardia Normal PPM function See Pace Art report No changes today  2.  Paroxysmal atrial fibrillation Burden by device interrogation 0% Continue low does amiodarone and Warfarin for CHADS2VASC of 6 Recent CBC stable  3.  OSA Compliant with CPAP  4.  HTN Stable No change required today  5.  Obesity Body mass index is 39.11 kg/m. Weight loss advised  6.  Shortness of breath/tracheobronchomalacia Followed by pulmonary - she has been seen at [facility] as well for possible stenting They recommended continued CPAP titration and weight loss at last office visit. Follow up planned for 6 months.  She is concerned there may be another cause, will proceed with CPX   Current medicines are reviewed at length with the patient today.   The patient does not have concerns regarding her medicines.  The  following changes were made today:  none  Labs/ tests ordered today include:  Orders Placed This Encounter  Procedures  . Cardiopulmonary exercise test     Disposition:   Follow up with [facility], Dr Lynn Dunn 1 year, Dr Lynn Dunn as scheduled    Signed, Chanetta Marshall, NP 05/23/2017 11:16 AM  [place] [address] [phone] (office) [phone] (fax

## 2017-05-23 ENCOUNTER — Ambulatory Visit (INDEPENDENT_AMBULATORY_CARE_PROVIDER_SITE_OTHER): Payer: Medicare Other | Admitting: Nurse Practitioner

## 2017-05-23 ENCOUNTER — Encounter: Payer: Medicare Other | Admitting: Nurse Practitioner

## 2017-05-23 ENCOUNTER — Encounter: Payer: Self-pay | Admitting: Nurse Practitioner

## 2017-05-23 VITALS — BP 134/62 | HR 69 | Ht 61.0 in | Wt 207.0 lb

## 2017-05-23 DIAGNOSIS — I10 Essential (primary) hypertension: Secondary | ICD-10-CM

## 2017-05-23 DIAGNOSIS — R0602 Shortness of breath: Secondary | ICD-10-CM | POA: Diagnosis not present

## 2017-05-23 DIAGNOSIS — I480 Paroxysmal atrial fibrillation: Secondary | ICD-10-CM | POA: Diagnosis not present

## 2017-05-23 DIAGNOSIS — Z9989 Dependence on other enabling machines and devices: Secondary | ICD-10-CM | POA: Diagnosis not present

## 2017-05-23 DIAGNOSIS — Z1231 Encounter for screening mammogram for malignant neoplasm of breast: Secondary | ICD-10-CM | POA: Diagnosis not present

## 2017-05-23 DIAGNOSIS — I495 Sick sinus syndrome: Secondary | ICD-10-CM

## 2017-05-23 DIAGNOSIS — J398 Other specified diseases of upper respiratory tract: Secondary | ICD-10-CM | POA: Diagnosis not present

## 2017-05-23 DIAGNOSIS — G4733 Obstructive sleep apnea (adult) (pediatric): Secondary | ICD-10-CM | POA: Diagnosis not present

## 2017-05-23 DIAGNOSIS — Z803 Family history of malignant neoplasm of breast: Secondary | ICD-10-CM | POA: Diagnosis not present

## 2017-05-23 LAB — CUP PACEART INCLINIC DEVICE CHECK
Implantable Lead Implant Date: 20161214
Implantable Lead Implant Date: 20161214
Implantable Lead Location: 753859
Implantable Lead Model: 5076
Implantable Lead Model: 5076
Implantable Pulse Generator Implant Date: 20161214
MDC IDC LEAD LOCATION: 753860
MDC IDC SESS DTM: 20190320113409

## 2017-05-23 NOTE — Patient Instructions (Addendum)
Medication Instructions:   Your physician recommends that you continue on your current medications as directed. Please refer to the Current Medication list given to you today.    If you need a refill on your cardiac medications before your next appointment, please call your pharmacy.  Labwork:  NONE ORDERED  TODAY    Testing/Procedures:  Your physician has recommended that you have a cardiopulmonary stress test (CPX). CPX testing is a non-invasive measurement of heart and lung function. It replaces a traditional treadmill stress test. This type of test provides a tremendous amount of information that relates not only to your present condition but also for future outcomes. This test combines measurements of you ventilation, respiratory gas exchange in the lungs, electrocardiogram (EKG), blood pressure and physical response before, during, and following an exercise protocol.     Follow-Up:    Your physician wants you to follow-up in: [place] will receive a reminder letter in the mail two months in advance. If you don't receive a letter, please call our office to schedule the follow-up appointment.   Remote monitoring is used to monitor your Pacemaker of ICD from home. This monitoring reduces the number of office visits required to check your device to one time per year. It allows [place] to keep an eye on the functioning of your device to ensure it is working properly. You are scheduled for a device check from home on . 05-30-17 You may send your transmission at any time that day. If you have a wireless device, the transmission will be sent automatically. After your physician reviews your transmission, you will receive a postcard with your next transmission date.     Any Other Special Instructions Will Be Listed Below (If Applicable).

## 2017-05-24 DIAGNOSIS — I129 Hypertensive chronic kidney disease with stage 1 through stage 4 chronic kidney disease, or unspecified chronic kidney disease: Secondary | ICD-10-CM | POA: Diagnosis not present

## 2017-05-24 DIAGNOSIS — E1122 Type 2 diabetes mellitus with diabetic chronic kidney disease: Secondary | ICD-10-CM | POA: Diagnosis not present

## 2017-05-24 DIAGNOSIS — G4733 Obstructive sleep apnea (adult) (pediatric): Secondary | ICD-10-CM | POA: Diagnosis not present

## 2017-05-24 DIAGNOSIS — D631 Anemia in chronic kidney disease: Secondary | ICD-10-CM | POA: Diagnosis not present

## 2017-05-24 DIAGNOSIS — Z9989 Dependence on other enabling machines and devices: Secondary | ICD-10-CM | POA: Diagnosis not present

## 2017-05-24 DIAGNOSIS — Z7901 Long term (current) use of anticoagulants: Secondary | ICD-10-CM | POA: Diagnosis not present

## 2017-05-24 DIAGNOSIS — Z8673 Personal history of transient ischemic attack (TIA), and cerebral infarction without residual deficits: Secondary | ICD-10-CM | POA: Diagnosis not present

## 2017-05-24 DIAGNOSIS — N183 Chronic kidney disease, stage 3 (moderate): Secondary | ICD-10-CM | POA: Diagnosis not present

## 2017-05-24 DIAGNOSIS — N2581 Secondary hyperparathyroidism of renal origin: Secondary | ICD-10-CM | POA: Diagnosis not present

## 2017-05-24 DIAGNOSIS — I4891 Unspecified atrial fibrillation: Secondary | ICD-10-CM | POA: Diagnosis not present

## 2017-05-24 DIAGNOSIS — I495 Sick sinus syndrome: Secondary | ICD-10-CM | POA: Diagnosis not present

## 2017-05-24 DIAGNOSIS — Z950 Presence of cardiac pacemaker: Secondary | ICD-10-CM | POA: Diagnosis not present

## 2017-05-28 ENCOUNTER — Other Ambulatory Visit (HOSPITAL_COMMUNITY): Payer: Self-pay | Admitting: *Deleted

## 2017-05-28 ENCOUNTER — Ambulatory Visit (HOSPITAL_COMMUNITY): Payer: Medicare Other | Attending: Internal Medicine

## 2017-05-28 DIAGNOSIS — R0602 Shortness of breath: Secondary | ICD-10-CM | POA: Insufficient documentation

## 2017-05-28 DIAGNOSIS — E669 Obesity, unspecified: Secondary | ICD-10-CM | POA: Insufficient documentation

## 2017-05-30 ENCOUNTER — Ambulatory Visit (INDEPENDENT_AMBULATORY_CARE_PROVIDER_SITE_OTHER): Payer: Medicare Other | Admitting: *Deleted

## 2017-05-30 ENCOUNTER — Telehealth: Payer: Self-pay | Admitting: Cardiology

## 2017-05-30 DIAGNOSIS — I495 Sick sinus syndrome: Secondary | ICD-10-CM | POA: Diagnosis not present

## 2017-05-30 NOTE — Telephone Encounter (Signed)
Spoke with pt and reminded pt of remote transmission that is due today. Pt verbalized understanding.   

## 2017-06-01 LAB — CUP PACEART REMOTE DEVICE CHECK
Battery Voltage: 3.02 V
Brady Statistic AP VP Percent: 0.32 %
Brady Statistic AS VP Percent: 0.06 %
Brady Statistic RA Percent Paced: 62.09 %
Brady Statistic RV Percent Paced: 0.4 %
Implantable Lead Implant Date: 20161214
Implantable Lead Location: 753860
Implantable Lead Model: 5076
Implantable Lead Model: 5076
Implantable Pulse Generator Implant Date: 20161214
Lead Channel Impedance Value: 380 Ohm
Lead Channel Impedance Value: 418 Ohm
Lead Channel Impedance Value: 456 Ohm
Lead Channel Pacing Threshold Amplitude: 0.875 V
Lead Channel Pacing Threshold Amplitude: 1.25 V
Lead Channel Pacing Threshold Pulse Width: 0.4 ms
Lead Channel Sensing Intrinsic Amplitude: 12 mV
Lead Channel Setting Pacing Amplitude: 2 V
Lead Channel Setting Pacing Pulse Width: 0.4 ms
Lead Channel Setting Sensing Sensitivity: 2.8 mV
MDC IDC LEAD IMPLANT DT: 20161214
MDC IDC LEAD LOCATION: 753859
MDC IDC MSMT BATTERY REMAINING LONGEVITY: 99 mo
MDC IDC MSMT LEADCHNL RA IMPEDANCE VALUE: 456 Ohm
MDC IDC MSMT LEADCHNL RA SENSING INTR AMPL: 3.125 mV
MDC IDC MSMT LEADCHNL RA SENSING INTR AMPL: 3.125 mV
MDC IDC MSMT LEADCHNL RV PACING THRESHOLD PULSEWIDTH: 0.4 ms
MDC IDC MSMT LEADCHNL RV SENSING INTR AMPL: 12 mV
MDC IDC SESS DTM: 20190327200320
MDC IDC SET LEADCHNL RV PACING AMPLITUDE: 2.5 V
MDC IDC STAT BRADY AP VS PERCENT: 61.8 %
MDC IDC STAT BRADY AS VS PERCENT: 37.82 %

## 2017-06-01 NOTE — Progress Notes (Signed)
Remote pacemaker transmission.   

## 2017-06-04 ENCOUNTER — Encounter: Payer: Self-pay | Admitting: Cardiology

## 2017-06-05 DIAGNOSIS — Z7901 Long term (current) use of anticoagulants: Secondary | ICD-10-CM | POA: Diagnosis not present

## 2017-06-05 DIAGNOSIS — I4891 Unspecified atrial fibrillation: Secondary | ICD-10-CM | POA: Diagnosis not present

## 2017-07-06 DIAGNOSIS — I4891 Unspecified atrial fibrillation: Secondary | ICD-10-CM | POA: Diagnosis not present

## 2017-07-06 DIAGNOSIS — Z7901 Long term (current) use of anticoagulants: Secondary | ICD-10-CM | POA: Diagnosis not present

## 2017-08-28 ENCOUNTER — Encounter: Payer: Self-pay | Admitting: Emergency Medicine

## 2017-08-28 ENCOUNTER — Ambulatory Visit (INDEPENDENT_AMBULATORY_CARE_PROVIDER_SITE_OTHER): Payer: Medicare Other | Admitting: Emergency Medicine

## 2017-08-28 DIAGNOSIS — G4733 Obstructive sleep apnea (adult) (pediatric): Secondary | ICD-10-CM

## 2017-08-28 DIAGNOSIS — J398 Other specified diseases of upper respiratory tract: Secondary | ICD-10-CM

## 2017-08-28 DIAGNOSIS — J449 Chronic obstructive pulmonary disease, unspecified: Secondary | ICD-10-CM

## 2017-08-28 MED ORDER — TIOTROPIUM BROMIDE MONOHYDRATE 2.5 MCG/ACT IN AERS: 2.0000 | INHALATION_SPRAY | Freq: Every day | RESPIRATORY_TRACT | 3 refills | Status: DC

## 2017-08-28 NOTE — Assessment & Plan Note (Signed)
Continue CPAP.  

## 2017-08-28 NOTE — Patient Instructions (Addendum)
Agree with your efforts to do low impact exercise, work on your conditioning and weight loss.  We will start Spiriva Respimat 2 puffs once a day. If you feel that your breathing benefits from this then please call our office and we will order through your pharmacy Continue your CPAP when sleeping. You could [place] it during the day at rest if you felt that this helped your breathing.  Follow with Dr Lamonte Sakai in 12 months or sooner if you have any problems

## 2017-08-28 NOTE — Assessment & Plan Note (Signed)
I reviewed the notes from Dr.Whadi at [facility].  I also discussed with the patient.  I believe that probably the risks outweigh the benefits of stenting.

## 2017-08-28 NOTE — Progress Notes (Signed)
Subjective:    Patient ID: Lynn Dunn, female    DOB: 1930-09-24, 84 y.o.   MRN: [id number]  HPI 84 year old obese woman never smoker with a history of hypertension, obstructive sleep apnea on CPAP, atrial fibrillation with sick sinus syndrome status post ablation, stroke. Apparently she has also been given diagnosis of obstructive lung disease at some point in the past and was given albuterol - didn't tolerate. She is referred today for evaluation of suspected tracheomalacia seen on cardiac CT scan 09/2016.   I reviewed the CT chest, appears to show some posterior wall involution of the trachea. Also note that the esophageal wall appears somewhat enlarged and thickened.  She tells me that she has had longstanding dyspnea. Seems to have worsened over the years. Worst with exertion. She also describes times when she can hear a snore even when she is siting up awake watching TV. She believes that she is awake when this happens. She was intubated for CVA about 10 yrs ago. She has gained wt, about 50 lbs over last 2 yrs.   Spirometry 2011 showed evidence for restriction with possible superimposed obstruction and a normal diffusion capacity.  ROV 01/18/17 --this is a follow-up visit for 84 year old woman with a history of obstructive sleep apnea (on CPAP), sick sinus syndrome and CVA.  I met her to evaluate long-standing dyspnea.  A CT scan of her chest showed some involution of the posterior wall of her trachea that was concerning for possible bronchotracheal malacia.  This prompted a bronchoscopy that was done on 10/12 and which revealed some laryngeal edema and significant partially obstructing proximal tracheomalacia that extended down into the bilateral mainstem airways.  Distal to those airways the lobar and segmental bronchi were widely patent.  There were no endobronchial lesions or abnormal secretions.  Pulmonary function testing was done on 01/18/17 that I have personally reviewed.  This  shows evidence for mixed restriction and obstruction without a significant bronchodilator response.  Her lung volumes are normal and her diffusion capacity is decreased but corrects to the normal range when adjusted for alveolar volume.  ROV 08/28/17 --patient is an 84 year old woman who follows up today for her history of obstructive lung disease.  Her evaluation including bronchoscopy has revealed significant bronchotracheal malacia.  Her distal airways are widely patent.  She has never really responded to bronchodilators which suggest that her obstruction is mainly large airway in nature.  She has a history of obstructive sleep apnea and uses CPAP. She saw Dr Kerin Ransom at [facility] to discuss possible stenting, ultimately decided that she would not pursue.    Review of Systems  Constitutional: Positive for unexpected weight change. Negative for fever.  HENT: Positive for congestion. Negative for dental problem, ear pain, nosebleeds, postnasal drip, rhinorrhea, sinus pressure, sneezing, sore throat and trouble swallowing.   Eyes: Negative for redness and itching.  Respiratory: Positive for cough and shortness of breath. Negative for chest tightness and wheezing.   Cardiovascular: Positive for palpitations. Negative for leg swelling.  Gastrointestinal: Negative for nausea and vomiting.  Genitourinary: Negative for dysuria.  Musculoskeletal: Negative for joint swelling.  Skin: Negative for rash.  Neurological: Negative for headaches.  Hematological: Does not bruise/bleed easily.  Psychiatric/Behavioral: Negative for dysphoric mood. The patient is not nervous/anxious.     Past Medical History:  Diagnosis Date  . Anemia   . Anginal pain ([place]) 2011   had to take nitro   . Arthritis   . COPD (chronic obstructive pulmonary disease) (  [place])   . Dysuria   . GERD (gastroesophageal reflux disease)   . Hematuria   . HTN (hypertension)   . Hx of echocardiogram    Echo (12/15):  Mild LVH, EF 60-65%, mild  MR, mod LAE (LA 49 mm), PASP 43 mmHg  . Obesity   . OSA (obstructive sleep apnea)    cpap at home  . PAF (paroxysmal atrial fibrillation) ([place])    s/p ablation 07/29/09  . Sick sinus syndrome ([facility])    pauses with syncope  . Stroke ([place]) 6/09   NO RESIDUAL PROBLEMS     Family History  Problem Relation Age of Onset  . Breast cancer Mother   . Brain cancer Father   . Breast cancer Unknown   . Brain cancer Unknown   . Stroke Maternal Aunt   . Hypertension Sister   . Heart attack Neg Hx      Social History   Socioeconomic History  . Marital status: Widowed    Spouse name: Not on file  . Number of children: Not on file  . Years of education: Not on file  . Highest education level: Not on file  Occupational History  . Not on file  Social Needs  . Financial resource strain: Not on file  . Food insecurity:    Worry: Not on file    Inability: Not on file  . Transportation needs:    Medical: Not on file    Non-medical: Not on file  Tobacco Use  . Smoking status: Never Smoker  . Smokeless tobacco: Never Used  Substance and Sexual Activity  . Alcohol use: No  . Drug use: No  . Sexual activity: Not on file  Lifestyle  . Physical activity:    Days per week: Not on file    Minutes per session: Not on file  . Stress: Not on file  Relationships  . Social connections:    Talks on phone: Not on file    Gets together: Not on file    Attends religious service: Not on file    Active member of club or organization: Not on file    Attends meetings of clubs or organizations: Not on file    Relationship status: Not on file  . Intimate partner violence:    Fear of current or ex partner: Not on file    Emotionally abused: Not on file    Physically abused: Not on file    Forced sexual activity: Not on file  Other Topics Concern  . Not on file  Social History Narrative  . Not on file  grew up near [employer], [place] fund[place]. Husband worked w asbestos.  Worked in Scientist, research (life sciences)  estate  Allergies  Allergen Reactions  . Ace Inhibitors Other (See Comments)    cough  . Albuterol Palpitations and Other (See Comments)    Atrial fibrillation  . Albuterol Sulfate Palpitations and Other (See Comments)    A. Fib  . Sulfa Antibiotics Rash     Outpatient Medications Prior to Visit  Medication Sig Dispense Refill  . amiodarone (PACERONE) 200 MG tablet TAKE 1/2 TABLET ONE TIME DAILY 45 tablet 1  . calcitRIOL (ROCALTROL) 0.25 MCG capsule Take 1 capsule by mouth daily.    . Cholecalciferol (VITAMIN D PO) Take 1 capsule by mouth every morning.     . Coenzyme Q10 (COQ10 PO) Take 1 capsule by mouth every morning.     Marland Kitchen estradiol (ESTRACE VAGINAL) 0.1 MG/GM vaginal cream Apply pea-sized amount  to vagina / urethra 2-3 x weekly to prevent labial adhesions 42.5 g 12  . furosemide (LASIX) 40 MG tablet Take 80 mg by mouth daily.     Marland Kitchen KRILL OIL OMEGA-3 PO Take 1 capsule by mouth twice daily.    Marland Kitchen levothyroxine (SYNTHROID, LEVOTHROID) 25 MCG tablet Take 25 mcg by mouth daily before breakfast.    . Magnesium 100 MG CAPS Take 1 capsule by mouth 2 (two) times daily.     . metoprolol tartrate (LOPRESSOR) 25 MG tablet Take 25 mg by mouth 2 (two) times daily.    . potassium chloride SA (K-DUR,KLOR-CON) 20 MEQ tablet Take 20 mEq by mouth 2 (two) times daily.     . rosuvastatin (CRESTOR) 10 MG tablet Take 10 mg by mouth daily.    . Turmeric 500 MG CAPS Take 500 mg by mouth 2 (two) times daily.    Marland Kitchen warfarin (COUMADIN) 1 MG tablet Take 3-4 mg by mouth See admin instructions. Take 4 mg by mouth on Wed Take 3 mg by mouth on Mon, Tues, Thurs, Fri, Sat, Sun     No facility-administered medications prior to visit.         Objective:   Physical Exam Vitals:   08/28/17 1521  BP: 128/74  Pulse: 82  SpO2: 92%  Weight: 211 lb (95.7 kg)  Height: 5' (1.524 m)   Gen: Pleasant, obese woman, in no distress,  normal affect  ENT: No lesions,  mouth clear,  oropharynx clear, no postnasal  drip  Neck: No JVD, no stridor  Lungs: No use of accessory muscles, clear without rales or rhonchi  Cardiovascular: RRR, heart sounds normal, no murmur or gallops, no peripheral edema  Musculoskeletal: No deformities, no cyanosis or clubbing  Neuro: alert, non focal  Skin: Warm, no lesions or rashes     Assessment & Plan:  Tracheomalacia I reviewed the notes from Dr[facility]Whadi at [facility].  I also discussed with the patient.  I believe that probably the risks outweigh the benefits of stenting.   OSA (obstructive sleep apnea) Continue CPAP  COPD (chronic obstructive pulmonary disease) ([place]) I believe that most of her obstruction is large airway in nature.  As above I am not sure that stenting is the right decision.  She did not tolerate albuterol but wonders if there is another option for bronchodilator that she can try.  We will try Spiriva Respimat to see if she benefits.  If so then she will call us and we will order through her pharmacy.  Baltazar Apo, MD, PhD 08/28/2017, 3:43 PM [facility] [phone] or if no answer [phone]

## 2017-08-28 NOTE — Assessment & Plan Note (Signed)
I believe that most of her obstruction is large airway in nature.  As above I am not sure that stenting is the right decision.  She did not tolerate albuterol but wonders if there is another option for bronchodilator that she can try.  We will try Spiriva Respimat to see if she benefits.  If so then she will call us and we will order through her pharmacy.

## 2017-08-29 ENCOUNTER — Encounter: Payer: Medicare Other | Admitting: *Deleted

## 2017-08-29 ENCOUNTER — Telehealth: Payer: Self-pay

## 2017-08-29 NOTE — Telephone Encounter (Signed)
Spoke with pt and reminded pt of remote transmission that is due today. Pt verbalized understanding.   

## 2017-08-30 ENCOUNTER — Encounter: Payer: Self-pay | Admitting: Cardiology

## 2017-08-30 NOTE — Progress Notes (Signed)
Letter  

## 2017-09-10 ENCOUNTER — Telehealth: Payer: Self-pay | Admitting: Emergency Medicine

## 2017-09-10 MED ORDER — TIOTROPIUM BROMIDE MONOHYDRATE 2.5 MCG/ACT IN AERS: 2.0000 | INHALATION_SPRAY | Freq: Every day | RESPIRATORY_TRACT | 0 refills | Status: DC

## 2017-09-10 NOTE — Telephone Encounter (Signed)
Samples left up front for pt.  Pt aware.  Nothing further needed.

## 2017-09-28 ENCOUNTER — Encounter: Payer: Self-pay | Admitting: Cardiology

## 2017-10-01 ENCOUNTER — Ambulatory Visit (INDEPENDENT_AMBULATORY_CARE_PROVIDER_SITE_OTHER): Payer: Medicare Other | Admitting: *Deleted

## 2017-10-01 DIAGNOSIS — I495 Sick sinus syndrome: Secondary | ICD-10-CM | POA: Diagnosis not present

## 2017-10-01 DIAGNOSIS — E782 Mixed hyperlipidemia: Secondary | ICD-10-CM | POA: Diagnosis not present

## 2017-10-01 DIAGNOSIS — I10 Essential (primary) hypertension: Secondary | ICD-10-CM | POA: Diagnosis not present

## 2017-10-01 DIAGNOSIS — Z7689 Persons encountering health services in other specified circumstances: Secondary | ICD-10-CM | POA: Diagnosis not present

## 2017-10-01 DIAGNOSIS — J449 Chronic obstructive pulmonary disease, unspecified: Secondary | ICD-10-CM | POA: Diagnosis not present

## 2017-10-01 DIAGNOSIS — Z8673 Personal history of transient ischemic attack (TIA), and cerebral infarction without residual deficits: Secondary | ICD-10-CM | POA: Diagnosis not present

## 2017-10-01 DIAGNOSIS — M810 Age-related osteoporosis without current pathological fracture: Secondary | ICD-10-CM | POA: Diagnosis not present

## 2017-10-01 NOTE — Progress Notes (Signed)
Remote pacemaker transmission.   

## 2017-10-02 ENCOUNTER — Encounter: Payer: Self-pay | Admitting: Cardiology

## 2017-10-03 DIAGNOSIS — Z7901 Long term (current) use of anticoagulants: Secondary | ICD-10-CM | POA: Diagnosis not present

## 2017-10-03 DIAGNOSIS — R7309 Other abnormal glucose: Secondary | ICD-10-CM | POA: Diagnosis not present

## 2017-10-03 DIAGNOSIS — Z5181 Encounter for therapeutic drug level monitoring: Secondary | ICD-10-CM | POA: Diagnosis not present

## 2017-10-03 DIAGNOSIS — J449 Chronic obstructive pulmonary disease, unspecified: Secondary | ICD-10-CM | POA: Diagnosis not present

## 2017-10-03 DIAGNOSIS — Z8673 Personal history of transient ischemic attack (TIA), and cerebral infarction without residual deficits: Secondary | ICD-10-CM | POA: Diagnosis not present

## 2017-10-03 DIAGNOSIS — E782 Mixed hyperlipidemia: Secondary | ICD-10-CM | POA: Diagnosis not present

## 2017-10-03 DIAGNOSIS — M810 Age-related osteoporosis without current pathological fracture: Secondary | ICD-10-CM | POA: Diagnosis not present

## 2017-10-03 DIAGNOSIS — Z7689 Persons encountering health services in other specified circumstances: Secondary | ICD-10-CM | POA: Diagnosis not present

## 2017-10-03 DIAGNOSIS — I10 Essential (primary) hypertension: Secondary | ICD-10-CM | POA: Diagnosis not present

## 2017-10-03 DIAGNOSIS — I4891 Unspecified atrial fibrillation: Secondary | ICD-10-CM | POA: Diagnosis not present

## 2017-10-05 DIAGNOSIS — R7309 Other abnormal glucose: Secondary | ICD-10-CM | POA: Diagnosis not present

## 2017-10-05 DIAGNOSIS — D509 Iron deficiency anemia, unspecified: Secondary | ICD-10-CM | POA: Diagnosis not present

## 2017-10-05 DIAGNOSIS — Z23 Encounter for immunization: Secondary | ICD-10-CM | POA: Diagnosis not present

## 2017-10-05 DIAGNOSIS — E039 Hypothyroidism, unspecified: Secondary | ICD-10-CM | POA: Diagnosis not present

## 2017-10-05 DIAGNOSIS — E782 Mixed hyperlipidemia: Secondary | ICD-10-CM | POA: Diagnosis not present

## 2017-10-05 DIAGNOSIS — Z0000 Encounter for general adult medical examination without abnormal findings: Secondary | ICD-10-CM | POA: Diagnosis not present

## 2017-10-05 LAB — CUP PACEART REMOTE DEVICE CHECK
Battery Voltage: 3.02 V
Brady Statistic AP VP Percent: 0.14 %
Brady Statistic AS VP Percent: 0.05 %
Brady Statistic AS VS Percent: 35.34 %
Implantable Lead Implant Date: 20161214
Implantable Lead Implant Date: 20161214
Implantable Lead Location: 753860
Implantable Lead Model: 5076
Implantable Pulse Generator Implant Date: 20161214
Lead Channel Impedance Value: 399 Ohm
Lead Channel Impedance Value: 399 Ohm
Lead Channel Impedance Value: 418 Ohm
Lead Channel Impedance Value: 437 Ohm
Lead Channel Pacing Threshold Amplitude: 0.625 V
Lead Channel Pacing Threshold Amplitude: 1.125 V
Lead Channel Pacing Threshold Pulse Width: 0.4 ms
Lead Channel Sensing Intrinsic Amplitude: 2.625 mV
Lead Channel Setting Pacing Amplitude: 2 V
Lead Channel Setting Sensing Sensitivity: 2.8 mV
MDC IDC LEAD LOCATION: 753859
MDC IDC MSMT BATTERY REMAINING LONGEVITY: 100 mo
MDC IDC MSMT LEADCHNL RA PACING THRESHOLD PULSEWIDTH: 0.4 ms
MDC IDC MSMT LEADCHNL RA SENSING INTR AMPL: 2.625 mV
MDC IDC MSMT LEADCHNL RV SENSING INTR AMPL: 10.625 mV
MDC IDC MSMT LEADCHNL RV SENSING INTR AMPL: 10.625 mV
MDC IDC SESS DTM: 20190727144655
MDC IDC SET LEADCHNL RV PACING AMPLITUDE: 2.5 V
MDC IDC SET LEADCHNL RV PACING PULSEWIDTH: 0.4 ms
MDC IDC STAT BRADY AP VS PERCENT: 64.46 %
MDC IDC STAT BRADY RA PERCENT PACED: 64.6 %
MDC IDC STAT BRADY RV PERCENT PACED: 0.2 %

## 2017-10-11 DIAGNOSIS — Z780 Asymptomatic menopausal state: Secondary | ICD-10-CM | POA: Diagnosis not present

## 2017-10-11 DIAGNOSIS — I6523 Occlusion and stenosis of bilateral carotid arteries: Secondary | ICD-10-CM | POA: Diagnosis not present

## 2017-10-11 DIAGNOSIS — I6529 Occlusion and stenosis of unspecified carotid artery: Secondary | ICD-10-CM | POA: Diagnosis not present

## 2017-10-11 DIAGNOSIS — I779 Disorder of arteries and arterioles, unspecified: Secondary | ICD-10-CM | POA: Diagnosis not present

## 2017-10-11 DIAGNOSIS — M85852 Other specified disorders of bone density and structure, left thigh: Secondary | ICD-10-CM | POA: Diagnosis not present

## 2017-10-11 DIAGNOSIS — M810 Age-related osteoporosis without current pathological fracture: Secondary | ICD-10-CM | POA: Diagnosis not present

## 2017-10-11 DIAGNOSIS — I10 Essential (primary) hypertension: Secondary | ICD-10-CM | POA: Diagnosis not present

## 2017-10-17 DIAGNOSIS — Z7901 Long term (current) use of anticoagulants: Secondary | ICD-10-CM | POA: Diagnosis not present

## 2017-10-17 DIAGNOSIS — I4891 Unspecified atrial fibrillation: Secondary | ICD-10-CM | POA: Diagnosis not present

## 2017-10-31 DIAGNOSIS — Z7901 Long term (current) use of anticoagulants: Secondary | ICD-10-CM | POA: Diagnosis not present

## 2017-10-31 DIAGNOSIS — I4891 Unspecified atrial fibrillation: Secondary | ICD-10-CM | POA: Diagnosis not present

## 2017-11-06 DIAGNOSIS — N2581 Secondary hyperparathyroidism of renal origin: Secondary | ICD-10-CM | POA: Diagnosis not present

## 2017-11-06 DIAGNOSIS — Z7901 Long term (current) use of anticoagulants: Secondary | ICD-10-CM | POA: Diagnosis not present

## 2017-11-06 DIAGNOSIS — I129 Hypertensive chronic kidney disease with stage 1 through stage 4 chronic kidney disease, or unspecified chronic kidney disease: Secondary | ICD-10-CM | POA: Diagnosis not present

## 2017-11-06 DIAGNOSIS — D631 Anemia in chronic kidney disease: Secondary | ICD-10-CM | POA: Diagnosis not present

## 2017-11-06 DIAGNOSIS — Z950 Presence of cardiac pacemaker: Secondary | ICD-10-CM | POA: Diagnosis not present

## 2017-11-06 DIAGNOSIS — Z8673 Personal history of transient ischemic attack (TIA), and cerebral infarction without residual deficits: Secondary | ICD-10-CM | POA: Diagnosis not present

## 2017-11-06 DIAGNOSIS — I495 Sick sinus syndrome: Secondary | ICD-10-CM | POA: Diagnosis not present

## 2017-11-06 DIAGNOSIS — E1122 Type 2 diabetes mellitus with diabetic chronic kidney disease: Secondary | ICD-10-CM | POA: Diagnosis not present

## 2017-11-06 DIAGNOSIS — Z23 Encounter for immunization: Secondary | ICD-10-CM | POA: Diagnosis not present

## 2017-11-06 DIAGNOSIS — I4891 Unspecified atrial fibrillation: Secondary | ICD-10-CM | POA: Diagnosis not present

## 2017-11-06 DIAGNOSIS — N183 Chronic kidney disease, stage 3 (moderate): Secondary | ICD-10-CM | POA: Diagnosis not present

## 2017-11-06 DIAGNOSIS — R319 Hematuria, unspecified: Secondary | ICD-10-CM | POA: Diagnosis not present

## 2017-11-07 DIAGNOSIS — I4891 Unspecified atrial fibrillation: Secondary | ICD-10-CM | POA: Diagnosis not present

## 2017-11-07 DIAGNOSIS — Z7901 Long term (current) use of anticoagulants: Secondary | ICD-10-CM | POA: Diagnosis not present

## 2017-11-21 DIAGNOSIS — Z7901 Long term (current) use of anticoagulants: Secondary | ICD-10-CM | POA: Diagnosis not present

## 2017-11-21 DIAGNOSIS — Z23 Encounter for immunization: Secondary | ICD-10-CM | POA: Diagnosis not present

## 2017-11-21 DIAGNOSIS — Z5181 Encounter for therapeutic drug level monitoring: Secondary | ICD-10-CM | POA: Diagnosis not present

## 2017-11-21 DIAGNOSIS — I4891 Unspecified atrial fibrillation: Secondary | ICD-10-CM | POA: Diagnosis not present

## 2017-11-26 ENCOUNTER — Telehealth: Payer: Self-pay | Admitting: Emergency Medicine

## 2017-11-26 MED ORDER — TIOTROPIUM BROMIDE MONOHYDRATE 18 MCG IN CAPS: 18.0000 ug | ORAL_CAPSULE | Freq: Every day | RESPIRATORY_TRACT | 6 refills | Status: DC

## 2017-11-26 NOTE — Telephone Encounter (Signed)
Ok Rx printed and signed. Spoke with pt and advised rx sent to pharmacy. Nothing further is needed.

## 2017-11-26 NOTE — Telephone Encounter (Signed)
Spoke with patient. She is requesting to have a RX for Spiriva Handihaler (the one with the capsules) to be sent to [facility] via the fax number above. She stated that the capsules seem to work better for her than the spray (Respimat).   RB, please advise if you are ok with her switching back the Handihaler and with [place] faxing this RX to the [place]. Thanks!

## 2017-11-26 NOTE — Telephone Encounter (Signed)
Yes I am Ok with this plan

## 2017-11-27 ENCOUNTER — Other Ambulatory Visit: Payer: Self-pay | Admitting: *Deleted

## 2017-11-27 DIAGNOSIS — I739 Peripheral vascular disease, unspecified: Principal | ICD-10-CM

## 2017-11-27 DIAGNOSIS — I779 Disorder of arteries and arterioles, unspecified: Secondary | ICD-10-CM

## 2017-12-05 DIAGNOSIS — I4891 Unspecified atrial fibrillation: Secondary | ICD-10-CM | POA: Diagnosis not present

## 2017-12-05 DIAGNOSIS — Z7901 Long term (current) use of anticoagulants: Secondary | ICD-10-CM | POA: Diagnosis not present

## 2017-12-05 DIAGNOSIS — Z5181 Encounter for therapeutic drug level monitoring: Secondary | ICD-10-CM | POA: Diagnosis not present

## 2017-12-10 DIAGNOSIS — Z08 Encounter for follow-up examination after completed treatment for malignant neoplasm: Secondary | ICD-10-CM | POA: Diagnosis not present

## 2017-12-10 DIAGNOSIS — D485 Neoplasm of uncertain behavior of skin: Secondary | ICD-10-CM | POA: Diagnosis not present

## 2017-12-10 DIAGNOSIS — L570 Actinic keratosis: Secondary | ICD-10-CM | POA: Diagnosis not present

## 2017-12-10 DIAGNOSIS — D0461 Carcinoma in situ of skin of right upper limb, including shoulder: Secondary | ICD-10-CM | POA: Diagnosis not present

## 2017-12-10 DIAGNOSIS — Z85828 Personal history of other malignant neoplasm of skin: Secondary | ICD-10-CM | POA: Diagnosis not present

## 2017-12-10 DIAGNOSIS — L821 Other seborrheic keratosis: Secondary | ICD-10-CM | POA: Diagnosis not present

## 2017-12-10 DIAGNOSIS — D0472 Carcinoma in situ of skin of left lower limb, including hip: Secondary | ICD-10-CM | POA: Diagnosis not present

## 2017-12-24 ENCOUNTER — Other Ambulatory Visit: Payer: Self-pay | Admitting: Internal Medicine

## 2017-12-31 ENCOUNTER — Telehealth: Payer: Self-pay | Admitting: Cardiology

## 2017-12-31 ENCOUNTER — Ambulatory Visit (INDEPENDENT_AMBULATORY_CARE_PROVIDER_SITE_OTHER): Payer: Medicare Other | Admitting: *Deleted

## 2017-12-31 DIAGNOSIS — I4819 Other persistent atrial fibrillation: Secondary | ICD-10-CM | POA: Diagnosis not present

## 2017-12-31 DIAGNOSIS — I495 Sick sinus syndrome: Secondary | ICD-10-CM

## 2017-12-31 NOTE — Telephone Encounter (Signed)
Spoke with pt and reminded pt of remote transmission that is due today. Pt verbalized understanding.   

## 2018-01-01 NOTE — Progress Notes (Signed)
Remote pacemaker transmission.   

## 2018-01-09 ENCOUNTER — Other Ambulatory Visit: Payer: Self-pay | Admitting: Interventional Cardiology

## 2018-01-16 ENCOUNTER — Other Ambulatory Visit: Payer: Self-pay | Admitting: Interventional Cardiology

## 2018-01-16 ENCOUNTER — Ambulatory Visit (HOSPITAL_COMMUNITY)
Admission: RE | Admit: 2018-01-16 | Discharge: 2018-01-16 | Disposition: A | Discharge status: Home / Self Care | Payer: Medicare Other | Source: Ambulatory Visit | Attending: Cardiology | Admitting: Cardiology

## 2018-01-16 DIAGNOSIS — I739 Peripheral vascular disease, unspecified: Secondary | ICD-10-CM | POA: Diagnosis not present

## 2018-01-16 DIAGNOSIS — I6529 Occlusion and stenosis of unspecified carotid artery: Secondary | ICD-10-CM

## 2018-01-16 DIAGNOSIS — I779 Disorder of arteries and arterioles, unspecified: Secondary | ICD-10-CM

## 2018-02-14 DIAGNOSIS — M1712 Unilateral primary osteoarthritis, left knee: Secondary | ICD-10-CM | POA: Diagnosis not present

## 2018-02-14 DIAGNOSIS — M1711 Unilateral primary osteoarthritis, right knee: Secondary | ICD-10-CM | POA: Diagnosis not present

## 2018-02-22 ENCOUNTER — Telehealth: Payer: Self-pay | Admitting: Interventional Cardiology

## 2018-02-22 NOTE — Telephone Encounter (Signed)
Pt is not managed by our coumadin clinic. Will route note to [facility], [place] who has seen the patient for Afib in the past and to Dr. Irish Lack, her primary cardiologist to address

## 2018-02-22 NOTE — Telephone Encounter (Signed)
New Message    Pt c/o medication issue:  1. Name of Medication: Coumadin  2. How are you currently taking this medication (dosage and times per day)? 3mg  per day  3. Are you having a reaction (difficulty breathing--STAT)? no  4. What is your medication issue? Pt is missing her appointments, NP is wanting to change prescription to Eliquis

## 2018-02-24 LAB — CUP PACEART REMOTE DEVICE CHECK
Battery Voltage: 3.02 V
Brady Statistic AP VP Percent: 0.21 %
Brady Statistic AS VP Percent: 0.05 %
Brady Statistic RA Percent Paced: 70.3 %
Brady Statistic RV Percent Paced: 0.26 %
Implantable Lead Implant Date: 20161214
Implantable Lead Location: 753860
Implantable Lead Model: 5076
Implantable Lead Model: 5076
Lead Channel Impedance Value: 380 Ohm
Lead Channel Impedance Value: 418 Ohm
Lead Channel Impedance Value: 418 Ohm
Lead Channel Pacing Threshold Amplitude: 1 V
Lead Channel Pacing Threshold Pulse Width: 0.4 ms
Lead Channel Sensing Intrinsic Amplitude: 11.625 mV
Lead Channel Sensing Intrinsic Amplitude: 3 mV
Lead Channel Setting Pacing Amplitude: 2.5 V
Lead Channel Setting Pacing Pulse Width: 0.4 ms
Lead Channel Setting Sensing Sensitivity: 2.8 mV
MDC IDC LEAD IMPLANT DT: 20161214
MDC IDC LEAD LOCATION: 753859
MDC IDC MSMT BATTERY REMAINING LONGEVITY: 94 mo
MDC IDC MSMT LEADCHNL RA IMPEDANCE VALUE: 475 Ohm
MDC IDC MSMT LEADCHNL RA SENSING INTR AMPL: 3 mV
MDC IDC MSMT LEADCHNL RV PACING THRESHOLD AMPLITUDE: 1.125 V
MDC IDC MSMT LEADCHNL RV PACING THRESHOLD PULSEWIDTH: 0.4 ms
MDC IDC MSMT LEADCHNL RV SENSING INTR AMPL: 11.625 mV
MDC IDC PG IMPLANT DT: 20161214
MDC IDC SESS DTM: 20191028153830
MDC IDC SET LEADCHNL RV PACING AMPLITUDE: 2.5 V
MDC IDC STAT BRADY AP VS PERCENT: 70.1 %
MDC IDC STAT BRADY AS VS PERCENT: 29.65 %

## 2018-02-25 DIAGNOSIS — Z7901 Long term (current) use of anticoagulants: Secondary | ICD-10-CM | POA: Diagnosis not present

## 2018-02-25 DIAGNOSIS — I4891 Unspecified atrial fibrillation: Secondary | ICD-10-CM | POA: Diagnosis not present

## 2018-02-28 ENCOUNTER — Encounter: Payer: Self-pay | Admitting: Physician Assistant

## 2018-02-28 NOTE — Telephone Encounter (Signed)
I have not seen the patient in over a year it appears.   I have no problem with the patient switching to Eliquis as long as they base the dose on a current BMet result.  We are not currently managing anticoagulation.

## 2018-02-28 NOTE — Telephone Encounter (Signed)
If [employer] can check a BMET - she would qualify for Eliquis 2.5mg  BID if her SCr is > 1.5. She would qualify for Eliquis 5mg  BID if her SCr is < 1.5. INR should be < 2 when she starts Eliquis.

## 2018-03-05 NOTE — Telephone Encounter (Signed)
Called and spoke to [place] at [facility] and made her aware that they should check a BMET prior to switching patient to Eliquis. Made her aware that she would qualify for Eliquis 2.5mg  BID if her SCr is > 1.5 and she would qualify for Eliquis 5mg  BID if her SCr is < 1.5.  Made her aware that her INR should be < 2 when she starts Eliquis. She verbalized understanding and states that she will let [place], NP know and that she will call back with any questions.

## 2018-03-13 DIAGNOSIS — N2581 Secondary hyperparathyroidism of renal origin: Secondary | ICD-10-CM | POA: Diagnosis not present

## 2018-03-13 DIAGNOSIS — E1122 Type 2 diabetes mellitus with diabetic chronic kidney disease: Secondary | ICD-10-CM | POA: Diagnosis not present

## 2018-03-13 DIAGNOSIS — D631 Anemia in chronic kidney disease: Secondary | ICD-10-CM | POA: Diagnosis not present

## 2018-03-13 DIAGNOSIS — E669 Obesity, unspecified: Secondary | ICD-10-CM | POA: Diagnosis not present

## 2018-03-13 DIAGNOSIS — Z950 Presence of cardiac pacemaker: Secondary | ICD-10-CM | POA: Diagnosis not present

## 2018-03-13 DIAGNOSIS — R319 Hematuria, unspecified: Secondary | ICD-10-CM | POA: Diagnosis not present

## 2018-03-13 DIAGNOSIS — Z7901 Long term (current) use of anticoagulants: Secondary | ICD-10-CM | POA: Diagnosis not present

## 2018-03-13 DIAGNOSIS — I129 Hypertensive chronic kidney disease with stage 1 through stage 4 chronic kidney disease, or unspecified chronic kidney disease: Secondary | ICD-10-CM | POA: Diagnosis not present

## 2018-03-13 DIAGNOSIS — N183 Chronic kidney disease, stage 3 (moderate): Secondary | ICD-10-CM | POA: Diagnosis not present

## 2018-03-13 DIAGNOSIS — Z8673 Personal history of transient ischemic attack (TIA), and cerebral infarction without residual deficits: Secondary | ICD-10-CM | POA: Diagnosis not present

## 2018-03-13 DIAGNOSIS — I495 Sick sinus syndrome: Secondary | ICD-10-CM | POA: Diagnosis not present

## 2018-03-13 DIAGNOSIS — N189 Chronic kidney disease, unspecified: Secondary | ICD-10-CM | POA: Diagnosis not present

## 2018-03-13 DIAGNOSIS — I4891 Unspecified atrial fibrillation: Secondary | ICD-10-CM | POA: Diagnosis not present

## 2018-03-19 ENCOUNTER — Encounter: Payer: Self-pay | Admitting: Physician Assistant

## 2018-03-28 ENCOUNTER — Ambulatory Visit: Payer: Medicare Other | Admitting: Physician Assistant

## 2018-04-03 DIAGNOSIS — Z0189 Encounter for other specified special examinations: Secondary | ICD-10-CM | POA: Diagnosis not present

## 2018-04-03 DIAGNOSIS — Z7901 Long term (current) use of anticoagulants: Secondary | ICD-10-CM | POA: Diagnosis not present

## 2018-04-03 DIAGNOSIS — D509 Iron deficiency anemia, unspecified: Secondary | ICD-10-CM | POA: Diagnosis not present

## 2018-04-03 DIAGNOSIS — I4891 Unspecified atrial fibrillation: Secondary | ICD-10-CM | POA: Diagnosis not present

## 2018-04-03 DIAGNOSIS — R7309 Other abnormal glucose: Secondary | ICD-10-CM | POA: Diagnosis not present

## 2018-04-03 DIAGNOSIS — E782 Mixed hyperlipidemia: Secondary | ICD-10-CM | POA: Diagnosis not present

## 2018-04-03 DIAGNOSIS — E039 Hypothyroidism, unspecified: Secondary | ICD-10-CM | POA: Diagnosis not present

## 2018-04-04 ENCOUNTER — Ambulatory Visit (INDEPENDENT_AMBULATORY_CARE_PROVIDER_SITE_OTHER): Payer: Medicare Other

## 2018-04-04 DIAGNOSIS — I495 Sick sinus syndrome: Secondary | ICD-10-CM | POA: Diagnosis not present

## 2018-04-05 LAB — CUP PACEART REMOTE DEVICE CHECK
Battery Remaining Longevity: 93 mo
Battery Voltage: 3.01 V
Brady Statistic AP VP Percent: 0.09 %
Brady Statistic AP VS Percent: 51.29 %
Brady Statistic AS VP Percent: 0.06 %
Brady Statistic AS VS Percent: 48.57 %
Brady Statistic RA Percent Paced: 51.37 %
Brady Statistic RV Percent Paced: 0.15 %
Date Time Interrogation Session: 20200130202455
Implantable Lead Implant Date: 20161214
Implantable Lead Implant Date: 20161214
Implantable Lead Location: 753859
Implantable Lead Location: 753860
Implantable Lead Model: 5076
Implantable Lead Model: 5076
Implantable Pulse Generator Implant Date: 20161214
Lead Channel Impedance Value: 399 Ohm
Lead Channel Impedance Value: 456 Ohm
Lead Channel Impedance Value: 456 Ohm
Lead Channel Impedance Value: 494 Ohm
Lead Channel Pacing Threshold Amplitude: 1.125 V
Lead Channel Pacing Threshold Amplitude: 1.25 V
Lead Channel Pacing Threshold Pulse Width: 0.4 ms
Lead Channel Pacing Threshold Pulse Width: 0.4 ms
Lead Channel Sensing Intrinsic Amplitude: 10.125 mV
Lead Channel Sensing Intrinsic Amplitude: 10.125 mV
Lead Channel Sensing Intrinsic Amplitude: 2.875 mV
Lead Channel Sensing Intrinsic Amplitude: 2.875 mV
Lead Channel Setting Pacing Amplitude: 2.25 V
Lead Channel Setting Pacing Amplitude: 2.5 V
Lead Channel Setting Pacing Pulse Width: 0.4 ms
Lead Channel Setting Sensing Sensitivity: 2.8 mV

## 2018-04-11 NOTE — Progress Notes (Signed)
Remote pacemaker transmission.   

## 2018-04-15 ENCOUNTER — Encounter: Payer: Self-pay | Admitting: Cardiology

## 2018-05-08 ENCOUNTER — Encounter: Payer: Medicare Other | Admitting: Internal Medicine

## 2018-05-08 DIAGNOSIS — I129 Hypertensive chronic kidney disease with stage 1 through stage 4 chronic kidney disease, or unspecified chronic kidney disease: Secondary | ICD-10-CM | POA: Diagnosis not present

## 2018-05-08 DIAGNOSIS — N183 Chronic kidney disease, stage 3 (moderate): Secondary | ICD-10-CM | POA: Diagnosis not present

## 2018-05-08 DIAGNOSIS — Z7901 Long term (current) use of anticoagulants: Secondary | ICD-10-CM | POA: Diagnosis not present

## 2018-05-08 DIAGNOSIS — I4891 Unspecified atrial fibrillation: Secondary | ICD-10-CM | POA: Diagnosis not present

## 2018-05-08 DIAGNOSIS — R0602 Shortness of breath: Secondary | ICD-10-CM | POA: Diagnosis not present

## 2018-05-08 DIAGNOSIS — E1122 Type 2 diabetes mellitus with diabetic chronic kidney disease: Secondary | ICD-10-CM | POA: Diagnosis not present

## 2018-05-08 DIAGNOSIS — E782 Mixed hyperlipidemia: Secondary | ICD-10-CM | POA: Diagnosis not present

## 2018-05-20 DIAGNOSIS — D0462 Carcinoma in situ of skin of left upper limb, including shoulder: Secondary | ICD-10-CM | POA: Diagnosis not present

## 2018-05-20 DIAGNOSIS — L570 Actinic keratosis: Secondary | ICD-10-CM | POA: Diagnosis not present

## 2018-05-20 DIAGNOSIS — L821 Other seborrheic keratosis: Secondary | ICD-10-CM | POA: Diagnosis not present

## 2018-05-20 DIAGNOSIS — D0461 Carcinoma in situ of skin of right upper limb, including shoulder: Secondary | ICD-10-CM | POA: Diagnosis not present

## 2018-05-29 DIAGNOSIS — I4891 Unspecified atrial fibrillation: Secondary | ICD-10-CM | POA: Diagnosis not present

## 2018-05-29 DIAGNOSIS — Z7901 Long term (current) use of anticoagulants: Secondary | ICD-10-CM | POA: Diagnosis not present

## 2018-06-10 ENCOUNTER — Other Ambulatory Visit: Payer: Self-pay | Admitting: Internal Medicine

## 2018-06-10 DIAGNOSIS — I10 Essential (primary) hypertension: Secondary | ICD-10-CM | POA: Diagnosis not present

## 2018-06-10 DIAGNOSIS — R5383 Other fatigue: Secondary | ICD-10-CM | POA: Diagnosis not present

## 2018-06-10 DIAGNOSIS — I4891 Unspecified atrial fibrillation: Secondary | ICD-10-CM | POA: Diagnosis not present

## 2018-06-12 ENCOUNTER — Other Ambulatory Visit: Payer: Self-pay

## 2018-06-12 ENCOUNTER — Telehealth: Payer: Self-pay | Admitting: Internal Medicine

## 2018-06-12 ENCOUNTER — Observation Stay (HOSPITAL_COMMUNITY)
Admission: EM | Admit: 2018-06-12 | Discharge: 2018-06-13 | Disposition: A | Discharge status: Home / Self Care | Payer: Medicare Other | Attending: Internal Medicine | Admitting: Internal Medicine

## 2018-06-12 ENCOUNTER — Encounter (HOSPITAL_COMMUNITY): Payer: Self-pay

## 2018-06-12 ENCOUNTER — Emergency Department (HOSPITAL_COMMUNITY): Payer: Medicare Other

## 2018-06-12 DIAGNOSIS — I4891 Unspecified atrial fibrillation: Secondary | ICD-10-CM | POA: Diagnosis not present

## 2018-06-12 DIAGNOSIS — E039 Hypothyroidism, unspecified: Secondary | ICD-10-CM | POA: Diagnosis not present

## 2018-06-12 DIAGNOSIS — Z7901 Long term (current) use of anticoagulants: Secondary | ICD-10-CM | POA: Insufficient documentation

## 2018-06-12 DIAGNOSIS — K219 Gastro-esophageal reflux disease without esophagitis: Secondary | ICD-10-CM | POA: Insufficient documentation

## 2018-06-12 DIAGNOSIS — R0602 Shortness of breath: Secondary | ICD-10-CM

## 2018-06-12 DIAGNOSIS — N183 Chronic kidney disease, stage 3 (moderate): Secondary | ICD-10-CM | POA: Diagnosis not present

## 2018-06-12 DIAGNOSIS — R9431 Abnormal electrocardiogram [ECG] [EKG]: Secondary | ICD-10-CM | POA: Diagnosis present

## 2018-06-12 DIAGNOSIS — R0789 Other chest pain: Secondary | ICD-10-CM | POA: Diagnosis not present

## 2018-06-12 DIAGNOSIS — R079 Chest pain, unspecified: Secondary | ICD-10-CM | POA: Diagnosis not present

## 2018-06-12 DIAGNOSIS — E669 Obesity, unspecified: Secondary | ICD-10-CM | POA: Insufficient documentation

## 2018-06-12 DIAGNOSIS — E782 Mixed hyperlipidemia: Secondary | ICD-10-CM | POA: Diagnosis present

## 2018-06-12 DIAGNOSIS — Z8249 Family history of ischemic heart disease and other diseases of the circulatory system: Secondary | ICD-10-CM | POA: Insufficient documentation

## 2018-06-12 DIAGNOSIS — M1990 Unspecified osteoarthritis, unspecified site: Secondary | ICD-10-CM | POA: Insufficient documentation

## 2018-06-12 DIAGNOSIS — I130 Hypertensive heart and chronic kidney disease with heart failure and stage 1 through stage 4 chronic kidney disease, or unspecified chronic kidney disease: Secondary | ICD-10-CM | POA: Diagnosis not present

## 2018-06-12 DIAGNOSIS — G4733 Obstructive sleep apnea (adult) (pediatric): Secondary | ICD-10-CM | POA: Diagnosis not present

## 2018-06-12 DIAGNOSIS — I5032 Chronic diastolic (congestive) heart failure: Secondary | ICD-10-CM | POA: Diagnosis not present

## 2018-06-12 DIAGNOSIS — I2510 Atherosclerotic heart disease of native coronary artery without angina pectoris: Secondary | ICD-10-CM | POA: Diagnosis not present

## 2018-06-12 DIAGNOSIS — Z79899 Other long term (current) drug therapy: Secondary | ICD-10-CM | POA: Diagnosis not present

## 2018-06-12 DIAGNOSIS — J441 Chronic obstructive pulmonary disease with (acute) exacerbation: Principal | ICD-10-CM | POA: Diagnosis present

## 2018-06-12 DIAGNOSIS — Z8673 Personal history of transient ischemic attack (TIA), and cerebral infarction without residual deficits: Secondary | ICD-10-CM | POA: Insufficient documentation

## 2018-06-12 DIAGNOSIS — J398 Other specified diseases of upper respiratory tract: Secondary | ICD-10-CM | POA: Diagnosis present

## 2018-06-12 DIAGNOSIS — J9601 Acute respiratory failure with hypoxia: Secondary | ICD-10-CM | POA: Diagnosis present

## 2018-06-12 DIAGNOSIS — Z79890 Hormone replacement therapy (postmenopausal): Secondary | ICD-10-CM | POA: Diagnosis not present

## 2018-06-12 DIAGNOSIS — I10 Essential (primary) hypertension: Secondary | ICD-10-CM | POA: Diagnosis not present

## 2018-06-12 DIAGNOSIS — I482 Chronic atrial fibrillation, unspecified: Secondary | ICD-10-CM | POA: Diagnosis not present

## 2018-06-12 DIAGNOSIS — R0902 Hypoxemia: Secondary | ICD-10-CM | POA: Diagnosis not present

## 2018-06-12 DIAGNOSIS — Z950 Presence of cardiac pacemaker: Secondary | ICD-10-CM | POA: Insufficient documentation

## 2018-06-12 HISTORY — DX: Other specified diseases of upper respiratory tract: J39.8

## 2018-06-12 LAB — CBC
HCT: 40.6 % (ref 36.0–46.0)
Hemoglobin: 12.8 g/dL (ref 12.0–15.0)
MCH: 31.8 pg (ref 26.0–34.0)
MCHC: 31.5 g/dL (ref 30.0–36.0)
MCV: 100.7 fL — ABNORMAL HIGH (ref 80.0–100.0)
Platelets: 211 10*3/uL (ref 150–400)
RBC: 4.03 MIL/uL (ref 3.87–5.11)
RDW: 15.8 % — ABNORMAL HIGH (ref 11.5–15.5)
WBC: 6.1 10*3/uL (ref 4.0–10.5)
nRBC: 0 % (ref 0.0–0.2)

## 2018-06-12 LAB — PROTIME-INR
INR: 3.1 — ABNORMAL HIGH (ref 0.8–1.2)
Prothrombin Time: 31.6 seconds — ABNORMAL HIGH (ref 11.4–15.2)

## 2018-06-12 LAB — BASIC METABOLIC PANEL
Anion gap: 14 (ref 5–15)
BUN: 23 mg/dL (ref 8–23)
CO2: 27 mmol/L (ref 22–32)
Calcium: 9.8 mg/dL (ref 8.9–10.3)
Chloride: 99 mmol/L (ref 98–111)
Creatinine, Ser: 1.56 mg/dL — ABNORMAL HIGH (ref 0.44–1.00)
GFR calc Af Amer: 35 mL/min — ABNORMAL LOW (ref >60)
GFR calc non Af Amer: 30 mL/min — ABNORMAL LOW (ref >60)
Glucose, Bld: 116 mg/dL — ABNORMAL HIGH (ref 70–99)
Potassium: 3.8 mmol/L (ref 3.5–5.1)
Sodium: 140 mmol/L (ref 135–145)

## 2018-06-12 LAB — TROPONIN I
Troponin I: <0.03 ng/mL (ref <0.03)
Troponin I: <0.03 ng/mL (ref <0.03)

## 2018-06-12 LAB — MRSA PCR SCREENING: MRSA by PCR: NEGATIVE

## 2018-06-12 MED ORDER — LEVALBUTEROL HCL 1.25 MG/0.5ML IN NEBU
1.2500 mg | INHALATION_SOLUTION | Freq: Four times a day (QID) | RESPIRATORY_TRACT | Status: DC
  Filled 2018-06-12: qty 0.5

## 2018-06-12 MED ORDER — DOCUSATE SODIUM 100 MG PO CAPS
200.0000 mg | ORAL_CAPSULE | Freq: Every day | ORAL | Status: DC
  Administered 2018-06-12: 200 mg via ORAL
  Filled 2018-06-12: qty 2

## 2018-06-12 MED ORDER — ONDANSETRON HCL 4 MG PO TABS: 4.0000 mg | ORAL_TABLET | Freq: Four times a day (QID) | ORAL | Status: DC | PRN

## 2018-06-12 MED ORDER — UMECLIDINIUM BROMIDE 62.5 MCG/INH IN AEPB
1.0000 | INHALATION_SPRAY | Freq: Every day | RESPIRATORY_TRACT | Status: DC
  Administered 2018-06-13: 1 via RESPIRATORY_TRACT
  Filled 2018-06-12: qty 7

## 2018-06-12 MED ORDER — ROSUVASTATIN CALCIUM 5 MG PO TABS
10.0000 mg | ORAL_TABLET | Freq: Every day | ORAL | Status: DC
  Administered 2018-06-12: 10 mg via ORAL
  Filled 2018-06-12: qty 2

## 2018-06-12 MED ORDER — TIOTROPIUM BROMIDE MONOHYDRATE 18 MCG IN CAPS: 18.0000 ug | ORAL_CAPSULE | Freq: Every day | RESPIRATORY_TRACT | Status: DC

## 2018-06-12 MED ORDER — WARFARIN - PHARMACIST DOSING INPATIENT: Freq: Every day | Status: DC

## 2018-06-12 MED ORDER — LEVALBUTEROL TARTRATE 45 MCG/ACT IN AERO: 1.0000 | INHALATION_SPRAY | Freq: Four times a day (QID) | RESPIRATORY_TRACT | Status: DC

## 2018-06-12 MED ORDER — ACETAMINOPHEN 325 MG PO TABS
650.0000 mg | ORAL_TABLET | Freq: Four times a day (QID) | ORAL | Status: DC | PRN
  Administered 2018-06-12 – 2018-06-13 (×2): 650 mg via ORAL
  Filled 2018-06-12 (×2): qty 2

## 2018-06-12 MED ORDER — WARFARIN SODIUM 1 MG PO TABS
1.5000 mg | ORAL_TABLET | Freq: Once | ORAL | Status: AC
  Administered 2018-06-12: 1.5 mg via ORAL
  Filled 2018-06-12: qty 1

## 2018-06-12 MED ORDER — ALBUTEROL SULFATE HFA 108 (90 BASE) MCG/ACT IN AERS
6.0000 | INHALATION_SPRAY | Freq: Once | RESPIRATORY_TRACT | Status: DC
  Filled 2018-06-12: qty 6.7

## 2018-06-12 MED ORDER — SODIUM CHLORIDE 0.9% FLUSH
3.0000 mL | Freq: Two times a day (BID) | INTRAVENOUS | Status: DC
  Administered 2018-06-13: 09:00:00 3 mL via INTRAVENOUS

## 2018-06-12 MED ORDER — SODIUM CHLORIDE 0.9% FLUSH: 3.0000 mL | INTRAVENOUS | Status: DC | PRN

## 2018-06-12 MED ORDER — AMIODARONE HCL 200 MG PO TABS
100.0000 mg | ORAL_TABLET | Freq: Every day | ORAL | Status: DC
  Administered 2018-06-13: 100 mg via ORAL
  Filled 2018-06-12: qty 1

## 2018-06-12 MED ORDER — FUROSEMIDE 80 MG PO TABS
80.0000 mg | ORAL_TABLET | Freq: Every day | ORAL | Status: DC
  Administered 2018-06-13: 09:00:00 80 mg via ORAL
  Filled 2018-06-12 (×2): qty 1

## 2018-06-12 MED ORDER — PREDNISONE 50 MG PO TABS
50.0000 mg | ORAL_TABLET | Freq: Every day | ORAL | Status: DC
  Administered 2018-06-13: 08:00:00 50 mg via ORAL
  Filled 2018-06-12: qty 1

## 2018-06-12 MED ORDER — SODIUM CHLORIDE 0.9 % IV SOLN
500.0000 mg | INTRAVENOUS | Status: AC
  Administered 2018-06-12: 18:00:00 500 mg via INTRAVENOUS
  Filled 2018-06-12: qty 500

## 2018-06-12 MED ORDER — AZITHROMYCIN 500 MG PO TABS: 500.0000 mg | ORAL_TABLET | Freq: Every day | ORAL | Status: DC

## 2018-06-12 MED ORDER — IPRATROPIUM BROMIDE 0.02 % IN SOLN
0.5000 mg | Freq: Four times a day (QID) | RESPIRATORY_TRACT | Status: DC
  Filled 2018-06-12: qty 2.5

## 2018-06-12 MED ORDER — METHYLPREDNISOLONE SODIUM SUCC 125 MG IJ SOLR
125.0000 mg | Freq: Once | INTRAMUSCULAR | Status: AC
  Administered 2018-06-12: 16:00:00 125 mg via INTRAVENOUS
  Filled 2018-06-12: qty 2

## 2018-06-12 MED ORDER — ACETAMINOPHEN 650 MG RE SUPP: 650.0000 mg | Freq: Four times a day (QID) | RECTAL | Status: DC | PRN

## 2018-06-12 MED ORDER — LEVOTHYROXINE SODIUM 50 MCG PO TABS
50.0000 ug | ORAL_TABLET | Freq: Every day | ORAL | Status: DC
  Administered 2018-06-13: 08:00:00 50 ug via ORAL
  Filled 2018-06-12: qty 1

## 2018-06-12 MED ORDER — ONDANSETRON HCL 4 MG/2ML IJ SOLN: 4.0000 mg | Freq: Four times a day (QID) | INTRAMUSCULAR | Status: DC | PRN

## 2018-06-12 MED ORDER — MOMETASONE FURO-FORMOTEROL FUM 200-5 MCG/ACT IN AERO
1.0000 | INHALATION_SPRAY | Freq: Two times a day (BID) | RESPIRATORY_TRACT | Status: DC
  Administered 2018-06-12 – 2018-06-13 (×2): 1 via RESPIRATORY_TRACT
  Filled 2018-06-12: qty 8.8

## 2018-06-12 MED ORDER — HYDROCHLOROTHIAZIDE 25 MG PO TABS
25.0000 mg | ORAL_TABLET | Freq: Every day | ORAL | Status: DC
  Administered 2018-06-12: 25 mg via ORAL
  Filled 2018-06-12 (×2): qty 1

## 2018-06-12 MED ORDER — METOPROLOL TARTRATE 25 MG PO TABS
25.0000 mg | ORAL_TABLET | Freq: Two times a day (BID) | ORAL | Status: DC
  Administered 2018-06-12 – 2018-06-13 (×2): 25 mg via ORAL
  Filled 2018-06-12 (×2): qty 1

## 2018-06-12 MED ORDER — SODIUM CHLORIDE 0.9 % IV SOLN: 250.0000 mL | INTRAVENOUS | Status: DC | PRN

## 2018-06-12 NOTE — ED Provider Notes (Signed)
[facility] EMERGENCY DEPARTMENT Provider Note   CSN: [id number] Arrival date & time: 06/12/18  1253    History   Chief Complaint Chief Complaint  Patient presents with  . Chest Pain    HPI Lynn Dunn is a 85 y.o. female.     Patient is an 85 year old female with past medical history of A. fib, pacemaker, COPD, obesity, hypertension who presents emergency department for shortness of breath.  Patient reports that for the last 3 days she has been feeling "terrible".  Reports feeling short of breath with even slight exertion.  She reports that she saw her primary care doctor who checked her blood pressure yesterday and it was elevated.  She followed up with her primary care doctor again today and blood pressure was still elevated and she was told that she had EKG changes so she needed to go to the emergency department.  Patient reports an episode of chest pain yesterday while at rest.  It only lasted a couple of moments and then it went away on its own.  Denies any chest pain today.  Denies any leg swelling, cough, sick contacts, fever, chills, nausea, vomiting.     Past Medical History:  Diagnosis Date  . Airway malacia   . Anemia   . Anginal pain ([place]) 2011   had to take nitro   . Arthritis   . COPD (chronic obstructive pulmonary disease) ([place])   . Dysuria   . GERD (gastroesophageal reflux disease)   . Hematuria   . HTN (hypertension)   . Hx of echocardiogram    Echo (12/15):  Mild LVH, EF 60-65%, mild MR, mod LAE (LA 49 mm), PASP 43 mmHg  . Obesity   . OSA (obstructive sleep apnea)    cpap at home  . PAF (paroxysmal atrial fibrillation) ([place])    s/p ablation 07/29/09  . Sick sinus syndrome (HCC)    pauses with syncope  . Stroke ([place]) 6/09   NO RESIDUAL PROBLEMS    Patient Active Problem List   Diagnosis Date Noted  . Acute respiratory failure with hypoxia ([place]) 06/13/2018  . Chest pain 06/13/2018  . Abnormal EKG 06/13/2018  . Airway malacia    . COPD exacerbation ([place]) 06/12/2018  . Restrictive lung disease 01/18/2017  . 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/07/2016  . COPD (chronic obstructive pulmonary disease) ([place]) 04/05/2016  . Subtherapeutic international normalized ratio (INR) 04/05/2016  . Atrial fibrillation with rapid ventricular response ([place]) 04/04/2016  . Carotid artery disease ([place]) 11/25/2015  . Pre-syncope 02/16/2015  . Sick sinus syndrome ([place]) 02/15/2015  . Swelling of right lower extremity 08/31/2014  . Mixed hyperlipidemia 12/27/2012  . Obesity 12/27/2012  . GI bleed 01/23/2012  . Anemia 01/23/2012  . History of CVA (cerebrovascular accident) 01/23/2012  . SHORTNESS OF BREATH (SOB) 09/13/2009  . Essential hypertension, benign 06/17/2009  . Atrial fibrillation ([place]) 06/17/2009  . OSA (obstructive sleep apnea) 06/17/2009    Past Surgical History:  Procedure Laterality Date  . ABDOMINAL HYSTERECTOMY    . ATRIAL ABLATION SURGERY     s/p ablation for afib 07/29/09  . CYSTOSCOPY N/A 06/23/2015   Procedure: CYSTOSCOPY;  Surgeon: Alexis Frock, MD;  [facility];  Service: Urology;  Laterality: N/A;  . EP IMPLANTABLE DEVICE N/A 02/17/2015   MDT Advisa MRI PPM implanted by Dr Lovena Le for sick sinus and syncope  . EP IMPLANTABLE DEVICE N/A 02/17/2015   Procedure: Loop Recorder Removal;  Surgeon: Evans Lance, MD;  Location: [facility]  CV LAB;  Service: Cardiovascular;  Laterality: N/A;  . ESOPHAGOGASTRODUODENOSCOPY  01/24/2012   Procedure: ESOPHAGOGASTRODUODENOSCOPY (EGD);  Surgeon: Jeryl Columbia, MD;  Location: [facility] ENDOSCOPY;  Service: Endoscopy;  Laterality: N/A;  . KNEE ARTHROSCOPY     BILATERAL  . LOOP RECORDER IMPLANT N/A 03/12/2014   Procedure: LOOP RECORDER IMPLANT;  Surgeon: Thompson Grayer, MD;  Location: [facility] CATH LAB;  Service: Cardiovascular;  Laterality: N/A;  . LYSIS OF ADHESION N/A 06/23/2015   Procedure: LYSIS OF LABIAL ADHESION;  Surgeon: Alexis Frock, MD;  [facility];  Service: Urology;  Laterality: N/A;   . VIDEO BRONCHOSCOPY Bilateral 12/15/2016   Procedure: VIDEO BRONCHOSCOPY WITHOUT FLUORO;  Surgeon: Collene Gobble, MD;  [facility] ENDOS[facility];  Service: Cardiopulmonary;  Laterality: Bilateral;     OB History   No obstetric history on file.      Home Medications    Prior to Admission medications   Medication Sig Start Date End Date Taking? Authorizing Provider  calcitRIOL (ROCALTROL) 0.25 MCG capsule Take 0.25 mcg by mouth at bedtime.  05/07/17  Yes [provider]  Coenzyme Q10 (COQ10 PO) Take 1 capsule by mouth at bedtime.    Yes [provider]  estradiol (ESTRACE VAGINAL) 0.1 MG/GM vaginal cream Apply pea-sized amount to vagina / urethra 2-3 x weekly to prevent labial adhesions Patient taking differently: Place vaginally See admin instructions. Apply a pea-sized amount to vagina/urethra 2-3 times a week to prevent labial adhesions 06/23/15  Yes Alexis Frock, MD  furosemide (LASIX) 40 MG tablet Take 80 mg by mouth daily.    Yes [provider]  Iron-FA-B Cmp-C-Biot-Probiotic (FUSION PLUS) CAPS Take 1 capsule by mouth every 7 (seven) days.  03/26/18  Yes [provider]  KRILL OIL OMEGA-3 PO Take 1 capsule by mouth daily with breakfast.    Yes [provider]  levothyroxine (SYNTHROID, LEVOTHROID) 50 MCG tablet Take 50 mcg by mouth daily before breakfast.  05/13/18  Yes [provider]  Magnesium 100 MG CAPS Take 100 mg by mouth 2 (two) times daily.    Yes [provider]  metoprolol tartrate (LOPRESSOR) 25 MG tablet Take 25 mg by mouth 2 (two) times daily.   Yes [provider]  potassium chloride SA (K-DUR,KLOR-CON) 20 MEQ tablet Take 20 mEq by mouth daily.    Yes [provider]  rosuvastatin (CRESTOR) 10 MG tablet Take 10 mg by mouth at bedtime.  09/14/16  Yes [provider]  tiotropium (SPIRIVA) 18 MCG inhalation capsule Place 1 capsule (18 mcg total) into inhaler and inhale daily. 11/26/17  Yes  Collene Gobble, MD  Turmeric 500 MG CAPS Take 500 mg by mouth 2 (two) times daily.   Yes [provider]  warfarin (COUMADIN) 3 MG tablet Take 1.5-3 mg by mouth See admin instructions. Take 1.5 mg by mouth in the evening on Sun/Wed and 3 mg on Mon/Tues/Thurs/Fri/Sat   Yes [provider]  amiodarone (PACERONE) 200 MG tablet Take 0.5 tablets (100 mg total) by mouth daily. 06/13/18   Black, Lezlie Octave, NP  azithromycin (ZITHROMAX) 500 MG tablet Take 1 tablet (500 mg total) by mouth at bedtime. 06/13/18   Black, Lezlie Octave, NP  predniSONE (DELTASONE) 10 MG tablet Take 4 tabs for 3 days starting 06/14/18, then take 3 tabs for 3 days then take 2 tabs for 3 days then take 1 tab for 3 days. 06/13/18   Black, Lezlie Octave, NP    Family History Family History  Problem Relation Age of Onset  . Breast cancer Mother   . Brain cancer Father   . Breast cancer Other   . Brain cancer Other   . Stroke Maternal Aunt   . Hypertension Sister   . Heart attack Neg Hx     Social History Social History   Tobacco Use  . Smoking status: Never Smoker  . Smokeless tobacco: Never Used  Substance Use Topics  . Alcohol use: No  . Drug use: No     Allergies   Ace inhibitors; Albuterol; Albuterol sulfate; Oysters [shellfish allergy]; and Sulfa antibiotics   Review of Systems Review of Systems  Constitutional: Positive for fatigue. Negative for appetite change, chills and fever.  HENT: Negative for ear pain and sore throat.   Eyes: Negative for pain and visual disturbance.  Respiratory: Positive for shortness of breath. Negative for cough.   Cardiovascular: Positive for chest pain. Negative for palpitations.  Gastrointestinal: Negative for abdominal pain and vomiting.  Genitourinary: Negative for dysuria and hematuria.  Musculoskeletal: Negative for arthralgias and back pain.  Skin: Negative for color change and rash.  Neurological: Negative for dizziness, seizures and syncope.  All other systems  reviewed and are negative.    Physical Exam Updated Vital Signs BP (!) 158/70 (BP Location: Left Arm)   Pulse 62   Temp 97.6 F (36.4 C) (Oral)   Resp 17   LMP  (LMP Unknown)   SpO2 93%   Physical Exam Vitals signs and nursing note reviewed.  Constitutional:      Appearance: Normal appearance.  HENT:     Head: Normocephalic.  Eyes:     Conjunctiva/sclera: Conjunctivae normal.     Pupils: Pupils are equal, round, and reactive to light.  Cardiovascular:     Rate and Rhythm: Normal rate and regular rhythm.  Pulmonary:     Effort: Pulmonary effort is normal.     Breath sounds: Examination of the right-lower field reveals decreased breath sounds. Examination of the left-lower field reveals decreased breath sounds. Decreased breath sounds present. No wheezing, rhonchi or rales.  Abdominal:     Palpations: Abdomen is soft.  Musculoskeletal:     Right lower leg: Edema present.     Left lower leg: Edema present.  Skin:    General: Skin is dry.  Neurological:     Mental Status: She is alert.  Psychiatric:        Mood and Affect: Mood normal.      ED Treatments / Results  Labs (all labs ordered are listed, but only abnormal results are displayed) Labs Reviewed  BASIC METABOLIC PANEL - Abnormal; Notable for the following components:      Result Value   Glucose, Bld 116 (*)    Creatinine, Ser 1.56 (*)    GFR calc non Af Amer 30 (*)    GFR calc Af Amer 35 (*)    All other components within normal limits  CBC - Abnormal; Notable for the following components:   MCV 100.7 (*)    RDW 15.8 (*)    All other components within normal limits  CBC - Abnormal; Notable for the following components:   RBC 3.86 (*)    RDW 15.8 (*)    All other components within normal limits  BASIC METABOLIC PANEL - Abnormal; Notable for the following components:   Glucose, Bld 173 (*)    BUN 28 (*)    Creatinine, Ser 1.71 (*)    GFR calc non Af Wyvonnia Lora  27 (*)    GFR calc Af Amer 31 (*)    All  other components within normal limits  PROTIME-INR - Abnormal; Notable for the following components:   Prothrombin Time 31.6 (*)    INR 3.1 (*)    All other components within normal limits  PROTIME-INR - Abnormal; Notable for the following components:   Prothrombin Time 29.8 (*)    INR 2.9 (*)    All other components within normal limits  MRSA PCR SCREENING  TROPONIN I  TROPONIN I    EKG EKG Interpretation  Date/Time:  Wednesday June 12 2018 13:00:21 EDT Ventricular Rate:  66 PR Interval:    QRS Duration: 110 QT Interval:  451 QTC Calculation: 473 R Axis:   -134 Text Interpretation:  Sinus rhythm Short PR interval Markedly posterior QRS axis Anteroseptal infarct, old No significant change since last tracing Confirmed by Wandra Arthurs [id number]) on 06/12/2018 2:11:10 PM   Radiology Dg Chest Port 1 View  Result Date: 06/12/2018 CLINICAL DATA:  Chest pain EXAM: PORTABLE CHEST 1 VIEW COMPARISON:  09/30/2016 FINDINGS: Left pacer remains in place, unchanged. Cardiomegaly. No confluent airspace opacities or effusions. No acute bony abnormality. IMPRESSION: Cardiomegaly.  No active disease. Electronically Signed   By: Rolm Baptise M.D.   On: 06/12/2018 14:09    Procedures Procedures (including critical care time)  Medications Ordered in ED Medications  methylPREDNISolone sodium succinate (SOLU-MEDROL) 125 mg/2 mL injection 125 mg (125 mg Intravenous Given 06/12/18 1544)  azithromycin (ZITHROMAX) 500 mg in sodium chloride 0.9 % 250 mL IVPB (500 mg Intravenous New Bag/Given 06/12/18 1738)  warfarin (COUMADIN) tablet 1.5 mg (1.5 mg Oral Given 06/12/18 2129)     Initial Impression / Assessment and Plan / ED Course  I have reviewed the triage vital signs and the nursing notes.  Pertinent labs & imaging results that were available during my care of the patient were reviewed by me and considered in my medical decision making (see chart for details).  Clinical Course as of Jun 14 722  Wed Jun 12, 2018  1534 Patient with COPD exacerbation x3 days, hx of the same. Dsat to 80% with ambulation, not on home oxygen   [KM]  1551 Admit to hospitalist   [KM]    Clinical Course User Index [KM] Alveria Apley, PA-C         Final Clinical Impressions(s) / ED Diagnoses   Final diagnoses:  COPD exacerbation [facility])    ED Discharge Orders         Ordered    azithromycin (ZITHROMAX) 500 MG tablet  Daily at bedtime     06/13/18 1032    amiodarone (PACERONE) 200 MG tablet  Daily    Note to Pharmacy:  PLEASE SCHEDULE AN APPT FOR FUTURE REFILLS   06/13/18 1032    predniSONE (DELTASONE) 10 MG tablet     06/13/18 1032    Increase activity slowly     06/13/18 1040    Diet - low sodium heart healthy     06/13/18 1040    Ambulatory referral to Pulmonology    Comments:  Follow up visit 2-3 weeks.   06/13/18 1040           Alveria Apley, PA-C 06/14/18 0724    Drenda Freeze, MD 06/14/18 1005

## 2018-06-12 NOTE — ED Notes (Signed)
Pt put on Purewick upon arrival. EKG exported and printed.

## 2018-06-12 NOTE — Progress Notes (Addendum)
ANTICOAGULATION CONSULT NOTE - Initial Consult  Pharmacy Consult for warfarin Indication: atrial fibrillation  Allergies  Allergen Reactions  . Ace Inhibitors Cough  . Albuterol Palpitations and Other (See Comments)    Atrial fibrillation   . Albuterol Sulfate Palpitations and Other (See Comments)    Causes A-Fib  . Oysters [Shellfish Allergy] Nausea And Vomiting  . Sulfa Antibiotics Rash    Patient Measurements:     Vital Signs: Temp: 98.3 F (36.8 C) (04/08 1701) Temp Source: Oral (04/08 1701) BP: 182/80 (04/08 1701) Pulse Rate: 62 (04/08 1701)  Labs: Recent Labs    06/12/18 1317 06/12/18 1613  HGB 12.8  --   HCT 40.6  --   PLT 211  --   CREATININE 1.56*  --   TROPONINI <0.03 <0.03    CrCl cannot be calculated (Unknown ideal weight.).   Medical History: Past Medical History:  Diagnosis Date  . Anemia   . Anginal pain ([place]) 2011   had to take nitro   . Arthritis   . COPD (chronic obstructive pulmonary disease) ([place])   . Dysuria   . GERD (gastroesophageal reflux disease)   . Hematuria   . HTN (hypertension)   . Hx of echocardiogram    Echo (12/15):  Mild LVH, EF 60-65%, mild MR, mod LAE (LA 49 mm), PASP 43 mmHg  . Obesity   . OSA (obstructive sleep apnea)    cpap at home  . PAF (paroxysmal atrial fibrillation) ([place])    s/p ablation 07/29/09  . Sick sinus syndrome (HCC)    pauses with syncope  . Stroke ([place]) 6/09   NO RESIDUAL PROBLEMS   Assessment: 85 yo female with history of PAF on warfarin PTA. Home regimen of 1.5mg  on Sun and Wednesday, and 3mg  on all other days confirmed with patient. Last dose given 4/7 in PM. INR 3.1. Patient due to for lower dose of 1.5mg  tonight. Will proceed with lower home dose.  Goal of Therapy:  INR 2-3 Monitor platelets by anticoagulation protocol: Yes   Plan:  Warfarin 1.5mg  PO x 1 tonight Daily INR Monitor for s/sx of bleeding  Carlson Belland A. Levada Dy, PharmD, [place]  Please  utilize Amion for appropriate phone number to reach the unit pharmacist ([place])   06/12/2018,5:24 PM

## 2018-06-12 NOTE — ED Notes (Signed)
ED TO INPATIENT HANDOFF REPORT  ED Nurse Name and Phone #: [phone] Lynn Name/Age/Gender Lynn Dunn 85 y.o. female Room/Bed: 029C/029C  Code Status   Code Status: Prior  Home/SNF/Other [place] Patient oriented to: self, place, time and situation Is this baseline? Yes   Triage Complete: Triage complete  Chief Complaint CP  Triage Note [facility]- pt coming from [facility] she was seen at clinic with increasing shortness of breath and chest pain. Hx of afib. Has pacemaker. Pt in paced rhythm with EMS of 60 but would jump to 100 and have a sinus pause and complained of sense of impending doom. Pt alert and oriented on arrival. Pt reported doubling dose of amiodarone and beta blocker.   150/90 HR 60- paced   Allergies Allergies  Allergen Reactions  . Ace Inhibitors Cough  . Albuterol Palpitations and Other (See Comments)    Atrial fibrillation   . Albuterol Sulfate Palpitations and Other (See Comments)    Causes A-Fib  . Oysters [Shellfish Allergy] Nausea And Vomiting  . Sulfa Antibiotics Rash    Level of Care/Admitting Diagnosis ED Disposition    ED Disposition Condition Comment   Admit  Hospital Area: [facility] [[id number]]  Level of Care: Med-Surg [16]  I expect the patient will be discharged within 24 hours: Yes  LOW acuity---Tx typically complete <24 hrs---ACUTE conditions typically can be evaluated <24 hours---LABS likely to return to acceptable levels <24 hours---IS near functional baseline---EXPECTED to return to current living arrangement---NOT newly hypoxic: Meets criteria for 5C-Observation unit  Diagnosis: COPD exacerbation [facility]) [203559]  Admitting Physician: Lynn Dunn [id number]  Attending Physician: Lynn Dunn [phone]  PT Class (Do Not Modify): Observation [104]  PT Acc Code (Do Not Modify): Observation [10022]       B Medical/Surgery History Past Medical History:  Diagnosis Date  . Anemia   . Anginal  pain ([place]) 2011   had to take nitro   . Arthritis   . COPD (chronic obstructive pulmonary disease) ([place])   . Dysuria   . GERD (gastroesophageal reflux disease)   . Hematuria   . HTN (hypertension)   . Hx of echocardiogram    Echo (12/15):  Mild LVH, EF 60-65%, mild MR, mod LAE (LA 49 mm), PASP 43 mmHg  . Obesity   . OSA (obstructive sleep apnea)    cpap at home  . PAF (paroxysmal atrial fibrillation) ([place])    s/p ablation 07/29/09  . Sick sinus syndrome (HCC)    pauses with syncope  . Stroke ([place]) 6/09   NO RESIDUAL PROBLEMS   Past Surgical History:  Procedure Laterality Date  . ABDOMINAL HYSTERECTOMY    . ATRIAL ABLATION SURGERY     s/p ablation for afib 07/29/09  . CYSTOSCOPY N/A 06/23/2015   Procedure: CYSTOSCOPY;  Surgeon: Alexis Frock, MD;  [facility];  Service: Urology;  Laterality: N/A;  . EP IMPLANTABLE DEVICE N/A 02/17/2015   MDT Advisa MRI PPM implanted by Dr Lovena Le for sick sinus and syncope  . EP IMPLANTABLE DEVICE N/A 02/17/2015   Procedure: Loop Recorder Removal;  Surgeon: Evans Lance, MD;  Location: [facility] CV LAB;  Service: Cardiovascular;  Laterality: N/A;  . ESOPHAGOGASTRODUODENOSCOPY  01/24/2012   Procedure: ESOPHAGOGASTRODUODENOSCOPY (EGD);  Surgeon: Jeryl Columbia, MD;  Location: [facility] ENDOSCOPY;  Service: Endoscopy;  Laterality: N/A;  . KNEE ARTHROSCOPY     BILATERAL  . LOOP RECORDER IMPLANT N/A 03/12/2014   Procedure: LOOP RECORDER IMPLANT;  Surgeon: Thompson Grayer, MD;  [facility] C[facility] LAB;  Service: Cardiovascular;  Laterality: N/A;  . LYSIS OF ADHESION N/A 06/23/2015   Procedure: LYSIS OF LABIAL ADHESION;  Surgeon: Alexis Frock, MD;  [facility];  Service: Urology;  Laterality: N/A;  . VIDEO BRONCHOSCOPY Bilateral 12/15/2016   Procedure: VIDEO BRONCHOSCOPY WITHOUT FLUORO;  Surgeon: Collene Gobble, MD;  [facility];  Service: Cardiopulmonary;  Laterality: Bilateral;     A IV Location/Drains/Wounds Patient  Lines/Drains/Airways Status   Active Line/Drains/Airways    Name:   Placement date:   Placement time:   Site:   Days:   Peripheral IV 06/12/18 Right Antecubital   06/12/18    1301    Antecubital   less than 1          Intake/Output Last 24 hours  Intake/Output Summary (Last 24 hours) at 06/12/2018 1617 Last data filed at 06/12/2018 1446 Gross per 24 hour  Intake -  Output 700 ml  Net -700 ml    Labs/Imaging Results for orders placed or performed during the hospital encounter of 06/12/18 (from the past 48 hour(s))  Basic metabolic panel     Status: Abnormal   Collection Time: 06/12/18  1:17 PM  Result Value Ref Range   Sodium 140 135 - 145 mmol/L   Potassium 3.8 3.5 - 5.1 mmol/L   Chloride 99 98 - 111 mmol/L   CO2 27 22 - 32 mmol/L   Glucose, Bld 116 (H) 70 - 99 mg/dL   BUN 23 8 - 23 mg/dL   Creatinine, Ser 1.56 (H) 0.44 - 1.00 mg/dL   Calcium 9.8 8.9 - 10.3 mg/dL   GFR calc non Af Amer 30 (L) >60 mL/min   GFR calc Af Amer 35 (L) >60 mL/min   Anion gap 14 5 - 15    Comment: Performed at [facility], [place] [address], [place], [place] [address]  CBC     Status: Abnormal   Collection Time: 06/12/18  1:17 PM  Result Value Ref Range   WBC 6.1 4.0 - 10.5 K/uL   RBC 4.03 3.87 - 5.11 MIL/uL   Hemoglobin 12.8 12.0 - 15.0 g/dL   HCT 40.6 36.0 - 46.0 %   MCV 100.7 (H) 80.0 - 100.0 fL   MCH 31.8 26.0 - 34.0 pg   MCHC 31.5 30.0 - 36.0 g/dL   RDW 15.8 (H) 11.5 - 15.5 %   Platelets 211 150 - 400 K/uL   nRBC 0.0 0.0 - 0.2 %    Comment: Performed at [facility], [place] [address] [place], [place] [address]  Troponin I - Once     Status: None   Collection Time: 06/12/18  1:17 PM  Result Value Ref Range   Troponin I <0.03 <0.03 ng/mL    Comment: Performed at [facility] [address], [place],  [address]   Dg Chest Port 1 View  Result Date: 06/12/2018 CLINICAL DATA:  Chest pain EXAM: PORTABLE CHEST 1 VIEW COMPARISON:  09/30/2016 FINDINGS: Left pacer remains  in place, unchanged. Cardiomegaly. No confluent airspace opacities or effusions. No acute bony abnormality. IMPRESSION: Cardiomegaly.  No active disease. Electronically Signed   By: Rolm Baptise M.D.   On: 06/12/2018 14:09    Pending Labs Unresulted Labs (From admission, onward)    Start     Ordered   06/12/18 1600  Troponin I - Once-Timed  Once-Timed,   STAT     06/12/18 1447  Vitals/Pain Today's Vitals   06/12/18 1500 06/12/18 1515 06/12/18 1530 06/12/18 1546  BP: (!) 161/61 (!) 147/71 131/85   Pulse: 65 64 62 67  Resp: 15 19 20 20   Temp:      TempSrc:      SpO2: 94% 96% 100% 100%  PainSc:        Isolation Precautions No active isolations  Medications Medications  hydrochlorothiazide (HYDRODIURIL) tablet 25 mg (25 mg Oral Given 06/12/18 1438)  albuterol (PROVENTIL HFA;VENTOLIN HFA) 108 (90 Base) MCG/ACT inhaler 6 puff (6 puffs Inhalation Not Given 06/12/18 1544)  methylPREDNISolone sodium succinate (SOLU-MEDROL) 125 mg/2 mL injection 125 mg (125 mg Intravenous Given 06/12/18 1544)    Mobility walks Low fall risk   Focused Assessments Cardiac Assessment Handoff:    Lab Results  Component Value Date   CKTOTAL 31 12/25/2009   CKMB 0.8 12/25/2009   TROPONINI <0.03 06/12/2018   Lab Results  Component Value Date   DDIMER  04/12/2009    <0.22        AT THE INHOUSE ESTABLISHED CUTOFF VALUE OF 0.48 ug/mL FEU, THIS ASSAY HAS BEEN DOCUMENTED IN THE LITERATURE TO HAVE A SENSITIVITY AND NEGATIVE PREDICTIVE VALUE OF AT LEAST 98 TO 99%.  THE TEST RESULT SHOULD BE CORRELATED WITH AN ASSESSMENT OF THE CLINICAL PROBABILITY OF DVT / VTE.   Does the Patient currently have chest pain? Yes  4/10 pain   R Recommendations: See Admitting Provider Note  Report given to:   Additional Notes: Pt alert, oriented, and ambulatory on room air.

## 2018-06-12 NOTE — ED Notes (Signed)
ED Provider at bedside. 

## 2018-06-12 NOTE — ED Notes (Signed)
Ambulated Pt to the restroom while monitoring Pulse Oximetry. Pt had steady gait. Pulse did not go below 80% or above 92% while ambulating.

## 2018-06-12 NOTE — ED Triage Notes (Signed)
[facility]- pt [place] [facility] she was seen at clinic with increasing shortness of breath and chest pain. Hx of afib. Has pacemaker. Pt in paced rhythm with EMS of 60 but would jump to 100 and have a sinus pause and complained of sense of impending doom. Pt alert and oriented on arrival. Pt reported doubling dose of amiodarone and beta blocker.   150/90 HR 60- paced

## 2018-06-12 NOTE — ED Notes (Signed)
Ambulated Pt a second time on PulseOx. Pt's O2 dropped to 80% with a good pleth. Pt walked approximately 24ft, and was short of breath upon returning to room. Was placed on 1.5L O2 [place].

## 2018-06-12 NOTE — Telephone Encounter (Signed)
Holly with [facility] called to report that the pt has been having chest pain, increased SOB with exertion, elevated BP 168/70 today... pt has doubled up her Amiodarone and Metoprolol on her own 3 days ago... pt says her body kept giving out as she tried to walk to the main building to see the NP.Marland Kitchen they did an EKG and she was worried that it was showing Ischemia, O2SAT 94%, HR 82...  She felt it was best to call EMS and have pt taken to the ER..I thanked her for letting us know and appreciate her for taking care of the pt.

## 2018-06-12 NOTE — H&P (Signed)
History and Physical    [name] FXT:[id number] DOB: 04/17/1930 DOA: 06/12/2018  PCP: Kevan Ny, [place]  Patient coming from: [facility] facility  Chief Complaint: Shortness of breath with exertion  HPI: Lynn Dunn is a 85 y.o. female with medical history significant of COPD, paroxysmal atrial fibrillation status post ablation, OSA, who presents with a few week history of worsening shortness of breath with exertion.  She states that she has had exertional dyspnea for about 5 years due to her COPD but worsening in the past few weeks.  She admits to a dry cough due to her allergies.  No fevers at home or productive cough, had some indigestion chest pain which lasted for 20 minutes yesterday and resolved spontaneously.  Denies any nausea, vomiting, diarrhea or abdominal pain.  Denies any sick contacts.  ED Course: Creatinine 1.56, troponin negative, chest x-ray without acute active disease.  Patient given albuterol inhaler, IV Solu-Medrol.  And ambulated and SPO2 dropped to 80%.  Review of Systems: As per HPI otherwise 10 point review of systems negative.   Past Medical History:  Diagnosis Date  . Anemia   . Anginal pain ([place]) 2011   had to take nitro   . Arthritis   . COPD (chronic obstructive pulmonary disease) ([place])   . Dysuria   . GERD (gastroesophageal reflux disease)   . Hematuria   . HTN (hypertension)   . Hx of echocardiogram    Echo (12/15):  Mild LVH, EF 60-65%, mild MR, mod LAE (LA 49 mm), PASP 43 mmHg  . Obesity   . OSA (obstructive sleep apnea)    cpap at home  . PAF (paroxysmal atrial fibrillation) ([place])    s/p ablation 07/29/09  . Sick sinus syndrome (HCC)    pauses with syncope  . Stroke ([place]) 6/09   NO RESIDUAL PROBLEMS    Past Surgical History:  Procedure Laterality Date  . ABDOMINAL HYSTERECTOMY    . ATRIAL ABLATION SURGERY     s/p ablation for afib 07/29/09  . CYSTOSCOPY N/A 06/23/2015   Procedure: CYSTOSCOPY;  Surgeon: Alexis Frock, MD;  Location: [facility];  Service: Urology;  Laterality: N/A;  . EP IMPLANTABLE DEVICE N/A 02/17/2015   MDT Advisa MRI PPM implanted by Dr Lovena Le for sick sinus and syncope  . EP IMPLANTABLE DEVICE N/A 02/17/2015   Procedure: Loop Recorder Removal;  Surgeon: Evans Lance, MD;  Location: [facility] CV LAB;  Service: Cardiovascular;  Laterality: N/A;  . ESOPHAGOGASTRODUODENOSCOPY  01/24/2012   Procedure: ESOPHAGOGASTRODUODENOSCOPY (EGD);  Surgeon: Jeryl Columbia, MD;  Location: [facility] ENDOSCOPY;  Service: Endoscopy;  Laterality: N/A;  . KNEE ARTHROSCOPY     BILATERAL  . LOOP RECORDER IMPLANT N/A 03/12/2014   Procedure: LOOP RECORDER IMPLANT;  Surgeon: Thompson Grayer, MD;  Location: [facility] CATH LAB;  Service: Cardiovascular;  Laterality: N/A;  . LYSIS OF ADHESION N/A 06/23/2015   Procedure: LYSIS OF LABIAL ADHESION;  Surgeon: Alexis Frock, MD;  Location: [facility];  Service: Urology;  Laterality: N/A;  . VIDEO BRONCHOSCOPY Bilateral 12/15/2016   Procedure: VIDEO BRONCHOSCOPY WITHOUT FLUORO;  Surgeon: Collene Gobble, MD;  Location: [facility] ENDOS[facility];  Service: Cardiopulmonary;  Laterality: Bilateral;     reports that she has never smoked. She has never used smokeless tobacco. She reports that she does not drink alcohol or use drugs.  Allergies  Allergen Reactions  . Ace Inhibitors Cough  . Albuterol Palpitations and Other (See Comments)    Atrial fibrillation   .  Albuterol Sulfate Palpitations and Other (See Comments)    Causes A-Fib  . Oysters [Shellfish Allergy] Nausea And Vomiting  . Sulfa Antibiotics Rash    Family History  Problem Relation Age of Onset  . Breast cancer Mother   . Brain cancer Father   . Breast cancer Other   . Brain cancer Other   . Stroke Maternal Aunt   . Hypertension Sister   . Heart attack Neg Hx     Prior to Admission medications   Medication Sig Start Date End Date Taking? Authorizing Provider  amiodarone (PACERONE) 200 MG tablet TAKE 1/2 TABLET ONE TIME DAILY  Patient taking differently: Take 100 mg by mouth daily.  06/11/18  Yes Allred, Jeneen Rinks, MD  calcitRIOL (ROCALTROL) 0.25 MCG capsule Take 0.25 mcg by mouth at bedtime.  05/07/17  Yes [provider]  Coenzyme Q10 (COQ10 PO) Take 1 capsule by mouth at bedtime.    Yes [provider]  estradiol (ESTRACE VAGINAL) 0.1 MG/GM vaginal cream Apply pea-sized amount to vagina / urethra 2-3 x weekly to prevent labial adhesions Patient taking differently: Place vaginally See admin instructions. Apply a pea-sized amount to vagina/urethra 2-3 times a week to prevent labial adhesions 06/23/15  Yes Alexis Frock, MD  furosemide (LASIX) 40 MG tablet Take 80 mg by mouth daily.    Yes [provider]  Iron-FA-B Cmp-C-Biot-Probiotic (FUSION PLUS) CAPS Take 1 capsule by mouth every 7 (seven) days.  03/26/18  Yes [provider]  KRILL OIL OMEGA-3 PO Take 1 capsule by mouth daily with breakfast.    Yes [provider]  levothyroxine (SYNTHROID, LEVOTHROID) 50 MCG tablet Take 50 mcg by mouth daily before breakfast.  05/13/18  Yes [provider]  Magnesium 100 MG CAPS Take 100 mg by mouth 2 (two) times daily.    Yes [provider]  metoprolol tartrate (LOPRESSOR) 25 MG tablet Take 25 mg by mouth 2 (two) times daily.   Yes [provider]  potassium chloride SA (K-DUR,KLOR-CON) 20 MEQ tablet Take 20 mEq by mouth daily.    Yes [provider]  rosuvastatin (CRESTOR) 10 MG tablet Take 10 mg by mouth at bedtime.  09/14/16  Yes [provider]  tiotropium (SPIRIVA) 18 MCG inhalation capsule Place 1 capsule (18 mcg total) into inhaler and inhale daily. 11/26/17  Yes Collene Gobble, MD  Turmeric 500 MG CAPS Take 500 mg by mouth 2 (two) times daily.   Yes [provider]  warfarin (COUMADIN) 3 MG tablet Take 1.5-3 mg by mouth See admin instructions. Take 1.5 mg by mouth in the evening on Sun/Wed and 3 mg on Mon/Tues/Thurs/Fri/Sat   Yes  [provider]    Physical Exam: Vitals:   06/12/18 1530 06/12/18 1546 06/12/18 1600 06/12/18 1630  BP: 131/85  (!) 151/77   Pulse: 62 67 (!) 59 61  Resp: 20 20 14 15   Temp:      TempSrc:      SpO2: 100% 100% 100% 99%     Constitutional: NAD, calm, comfortable Eyes: PERRL, lids and conjunctivae normal ENMT: Wearing surgical mask Neck: normal, supple, no masses, no thyromegaly Respiratory: Diminished breath sounds, no wheezing, on room air at rest, no conversational dyspnea Cardiovascular: Regular rate and rhythm, no murmurs / rubs / gallops. No extremity edema.  Abdomen: no tenderness, no masses palpated. No hepatosplenomegaly. Bowel sounds positive.  Musculoskeletal: no clubbing / cyanosis. No joint deformity upper and lower extremities. Good ROM, no contractures. Normal muscle  tone.  Skin: no rashes, lesions, ulcers on exposed skin Neurologic: CN 2-12 grossly intact.  Speech clear, no focal deficits Psychiatric: Normal judgment and insight. Alert and oriented x 3. Normal mood.   Labs on Admission: I have personally reviewed following labs and imaging studies  CBC: Recent Labs  Lab 06/12/18 1317  WBC 6.1  HGB 12.8  HCT 40.6  MCV 100.7*  PLT 341   Basic Metabolic Panel: Recent Labs  Lab 06/12/18 1317  NA 140  K 3.8  CL 99  CO2 27  GLUCOSE 116*  BUN 23  CREATININE 1.56*  CALCIUM 9.8   GFR: CrCl cannot be calculated (Unknown ideal weight.). Liver Function Tests: No results for input(s): AST, ALT, ALKPHOS, BILITOT, PROT, ALBUMIN in the last 168 hours. No results for input(s): LIPASE, AMYLASE in the last 168 hours. No results for input(s): AMMONIA in the last 168 hours. Coagulation Profile: No results for input(s): INR, PROTIME in the last 168 hours. Cardiac Enzymes: Recent Labs  Lab 06/12/18 1317 06/12/18 1613  TROPONINI <0.03 <0.03   BNP (last 3 results) No results for input(s): PROBNP in the last 8760 hours. HbA1C: No results for  input(s): HGBA1C in the last 72 hours. CBG: No results for input(s): GLUCAP in the last 168 hours. Lipid Profile: No results for input(s): CHOL, HDL, LDLCALC, TRIG, CHOLHDL, LDLDIRECT in the last 72 hours. Thyroid Function Tests: No results for input(s): TSH, T4TOTAL, FREET4, T3FREE, THYROIDAB in the last 72 hours. Anemia Panel: No results for input(s): VITAMINB12, FOLATE, FERRITIN, TIBC, IRON, RETICCTPCT in the last 72 hours. Urine analysis:    Component Value Date/Time   COLORURINE YELLOW 09/30/2016 1848   APPEARANCEUR CLEAR 09/30/2016 1848   LABSPEC 1.017 09/30/2016 1848   PHURINE 5.0 09/30/2016 1848   GLUCOSEU NEGATIVE 09/30/2016 1848   HGBUR NEGATIVE 09/30/2016 1848   BILIRUBINUR NEGATIVE 09/30/2016 1848   KETONESUR NEGATIVE 09/30/2016 1848   PROTEINUR NEGATIVE 09/30/2016 1848   UROBILINOGEN 0.2 05/13/2012 0136   NITRITE NEGATIVE 09/30/2016 1848   LEUKOCYTESUR TRACE (A) 09/30/2016 1848   Sepsis Labs: !!!!!!!!!!!!!!!!!!!!!!!!!!!!!!!!!!!!!!!!!!!! @LABRCNTIP (procalcitonin:4,lacticidven:4) )No results found for this or any previous visit (from the past 240 hour(s)).   Radiological Exams on Admission: Dg Chest Port 1 View  Result Date: 06/12/2018 CLINICAL DATA:  Chest pain EXAM: PORTABLE CHEST 1 VIEW COMPARISON:  09/30/2016 FINDINGS: Left pacer remains in place, unchanged. Cardiomegaly. No confluent airspace opacities or effusions. No acute bony abnormality. IMPRESSION: Cardiomegaly.  No active disease. Electronically Signed   By: Rolm Baptise M.D.   On: 06/12/2018 14:09    EKG: Independently reviewed.  Normal sinus rhythm without acute ST changes  Assessment/Plan Active Problems:   COPD exacerbation (HCC)   COPD exacerbation  -Chest x-ray without any acute pulmonary consolidation or edema -Prednisone, azithromycin -Patient states that albuterol throws her into A. fib and refusing albuterol inhaler -Trial Xopenex, Spiriva, Dulera  Acute hypoxemic respiratory failure   -Does not require nasal cannula O2 at baseline, with ambulation in the emergency department, SPO2 dropped to 80%.  She was placed on 1.5 L nasal cannula O2 in the emergency department. -Continue to wean oxygen as able, evaluate for home oxygen prior to discharge  CKD stage 3 -Baseline creatinine 1.4 -Stable  Chronic atrial fibrillation status post ablation -Continue amiodarone, metoprolol.  Coumadin per pharmacy  Chronic diastolic heart failure -Previous echocardiogram EF 60 to 93%, grade 2 diastolic dysfunction -Does not appear to be fluid overloaded on examination today  -Continue Lasix  Hyperlipidemia -Continue Crestor  Hypothyroidism -Continue Synthroid   DVT prophylaxis: Coumadin Code Status: Full code, confirmed with patient at bedside today Family Communication: None.  Patient declined for me to call family members for update Disposition Plan: Pending improvement in her COPD exacerbation, hypoxemia.  Suspect she may return back to her independent living facility Consults called: None Admission status: Observation   Severity of Illness: The appropriate patient status for this patient is OBSERVATION. Observation status is judged to be reasonable and necessary in order to provide the required intensity of service to ensure the patient's safety. The patient's presenting symptoms, physical exam findings, and initial radiographic and laboratory data in the context of their medical condition is felt to place them at decreased risk for further clinical deterioration. Furthermore, it is anticipated that the patient will be medically stable for discharge from the hospital within 2 midnights of admission. The following factors support the patient status of observation.   " The patient's presenting symptoms include dyspnea with exertion. " The physical exam findings include diminished breath sounds. " The initial radiographic and laboratory data are unremarkable.   Dessa Phi, DO  [facility] 06/12/2018, 4:54 PM    How to contact the [facility] Attending or Consulting provider [place] or covering provider during after hours [place], for this patient?  1. Check the care team in [facility] and look for a) attending/consulting [facility] provider listed and b) the [facility] team listed 2. Log into [email or web address] and use [place]'s universal password to access. If you do not have the password, please contact the hospital operator. 3. Locate the [facility] provider you are looking for under [facility] and page to a number that you can be directly reached. 4. If you still have difficulty reaching the provider, please page the [facility] (Director on Call) for the Hospitalists listed on amion for assistance.

## 2018-06-12 NOTE — Telephone Encounter (Signed)
Earnest Bailey, NP calling to make [place] aware they just sent patient to ER with Chest pain they want to sent to triage nurse.

## 2018-06-12 NOTE — Progress Notes (Signed)
Patient trasfered from ED to [phone] via wheelchair; alert and oriented x 4; no complaints of pain; IV saline locked in RAC; skin intact. Orient patient to room and unit; gave patient care guide; instructed how to use the call bell and  fall risk precautions. Will continue to monitor the patient.

## 2018-06-12 NOTE — ED Notes (Signed)
EDPA stated she want trop at 16:00 not 14:00

## 2018-06-13 ENCOUNTER — Encounter (HOSPITAL_COMMUNITY): Payer: Self-pay | Admitting: Internal Medicine

## 2018-06-13 ENCOUNTER — Telehealth: Payer: Self-pay | Admitting: *Deleted

## 2018-06-13 ENCOUNTER — Telehealth: Payer: Self-pay | Admitting: Internal Medicine

## 2018-06-13 DIAGNOSIS — I480 Paroxysmal atrial fibrillation: Secondary | ICD-10-CM | POA: Diagnosis not present

## 2018-06-13 DIAGNOSIS — J9601 Acute respiratory failure with hypoxia: Secondary | ICD-10-CM | POA: Diagnosis present

## 2018-06-13 DIAGNOSIS — E782 Mixed hyperlipidemia: Secondary | ICD-10-CM

## 2018-06-13 DIAGNOSIS — I10 Essential (primary) hypertension: Secondary | ICD-10-CM | POA: Diagnosis not present

## 2018-06-13 DIAGNOSIS — J441 Chronic obstructive pulmonary disease with (acute) exacerbation: Secondary | ICD-10-CM | POA: Diagnosis not present

## 2018-06-13 DIAGNOSIS — R9431 Abnormal electrocardiogram [ECG] [EKG]: Secondary | ICD-10-CM | POA: Diagnosis present

## 2018-06-13 DIAGNOSIS — G4733 Obstructive sleep apnea (adult) (pediatric): Secondary | ICD-10-CM | POA: Diagnosis not present

## 2018-06-13 DIAGNOSIS — J398 Other specified diseases of upper respiratory tract: Secondary | ICD-10-CM | POA: Diagnosis present

## 2018-06-13 DIAGNOSIS — R079 Chest pain, unspecified: Secondary | ICD-10-CM | POA: Diagnosis present

## 2018-06-13 LAB — CBC
HCT: 37.2 % (ref 36.0–46.0)
Hemoglobin: 12.2 g/dL (ref 12.0–15.0)
MCH: 31.6 pg (ref 26.0–34.0)
MCHC: 32.8 g/dL (ref 30.0–36.0)
MCV: 96.4 fL (ref 80.0–100.0)
Platelets: 205 10*3/uL (ref 150–400)
RBC: 3.86 MIL/uL — ABNORMAL LOW (ref 3.87–5.11)
RDW: 15.8 % — ABNORMAL HIGH (ref 11.5–15.5)
WBC: 5.5 10*3/uL (ref 4.0–10.5)
nRBC: 0 % (ref 0.0–0.2)

## 2018-06-13 LAB — BASIC METABOLIC PANEL
Anion gap: 11 (ref 5–15)
BUN: 28 mg/dL — ABNORMAL HIGH (ref 8–23)
CO2: 29 mmol/L (ref 22–32)
Calcium: 9.1 mg/dL (ref 8.9–10.3)
Chloride: 100 mmol/L (ref 98–111)
Creatinine, Ser: 1.71 mg/dL — ABNORMAL HIGH (ref 0.44–1.00)
GFR calc Af Amer: 31 mL/min — ABNORMAL LOW (ref >60)
GFR calc non Af Amer: 27 mL/min — ABNORMAL LOW (ref >60)
Glucose, Bld: 173 mg/dL — ABNORMAL HIGH (ref 70–99)
Potassium: 3.5 mmol/L (ref 3.5–5.1)
Sodium: 140 mmol/L (ref 135–145)

## 2018-06-13 LAB — PROTIME-INR
INR: 2.9 — ABNORMAL HIGH (ref 0.8–1.2)
Prothrombin Time: 29.8 seconds — ABNORMAL HIGH (ref 11.4–15.2)

## 2018-06-13 MED ORDER — AMIODARONE HCL 200 MG PO TABS: 100.0000 mg | ORAL_TABLET | Freq: Every day | ORAL | 0 refills | Status: DC

## 2018-06-13 MED ORDER — WARFARIN SODIUM 1 MG PO TABS
1.5000 mg | ORAL_TABLET | Freq: Once | ORAL | Status: DC
  Filled 2018-06-13: qty 1

## 2018-06-13 MED ORDER — IPRATROPIUM BROMIDE 0.02 % IN SOLN: 0.5000 mg | Freq: Four times a day (QID) | RESPIRATORY_TRACT | Status: DC | PRN

## 2018-06-13 MED ORDER — ADULT MULTIVITAMIN W/MINERALS CH
1.0000 | ORAL_TABLET | Freq: Every day | ORAL | Status: DC
  Administered 2018-06-13: 1 via ORAL
  Filled 2018-06-13: qty 1

## 2018-06-13 MED ORDER — LEVALBUTEROL HCL 1.25 MG/0.5ML IN NEBU: 1.2500 mg | INHALATION_SOLUTION | Freq: Four times a day (QID) | RESPIRATORY_TRACT | Status: DC | PRN

## 2018-06-13 MED ORDER — GLUCERNA SHAKE PO LIQD
237.0000 mL | Freq: Two times a day (BID) | ORAL | Status: DC
  Administered 2018-06-13: 237 mL via ORAL

## 2018-06-13 MED ORDER — PREDNISONE 10 MG PO TABS: ORAL_TABLET | ORAL | 0 refills | Status: DC

## 2018-06-13 MED ORDER — AZITHROMYCIN 500 MG PO TABS: 500.0000 mg | ORAL_TABLET | Freq: Every day | ORAL | 0 refills | Status: DC

## 2018-06-13 NOTE — Discharge Summary (Addendum)
Physician Discharge Summary  Lynn Dunn OEV:[id number] DOB: [birth year] DOA: 06/12/2018  PCP: [place] date: 06/12/2018 Discharge date: 06/13/2018  Time spent: 45 minutes  Recommendations for Outpatient Follow-up:  1. Follow up with Dr. Rayann Heman as scheduled 4/22 regarding pacer. 2. Pulmonary office will contact patient for follow up appointment 2-3 weeks 3. Wear oxygen supplementation as directed.  4. Return [place] [facility] [place] with home oxygen and HH pt and rn   Discharge Diagnoses:  Principal Problem:   Acute respiratory failure with hypoxia ([place]) Active Problems:   COPD exacerbation (HCC)   Airway malacia   Abnormal EKG   Essential hypertension, benign   Atrial fibrillation (HCC)   OSA (obstructive sleep apnea)   Chest pain   Mixed hyperlipidemia   Discharge Condition: stable  Diet recommendation: heart healthy  There were no vitals filed for this visit.  History of present illness:  Patient with hx of tracheal malacia, osa, obesity, afib s/p ablation sent [facility] she was seen at clinic on 4/8 with increasing shortness of breath and chest pain.  Has pacemaker. Pt in paced rhythm with EMS of 60 but would jump to 100 and have a sinus pause.  Pt alert and oriented on arrival. Pt reported doubling dose of amiodarone and beta blocker.   Hospital Course:  COPD exacerbation Chest x-ray without any acute pulmonary consolidation or edema Prednisone, azithromycin provided in ed. Patient refused nebs as she reports that albuterol throws her into A. Fib. On discharge no increase work of breathing with conversation or eating. Oxygen saturation level greater 90 on room air at rest. Will discharge with slow prednisone taper and home oxygen. Have requested pulmonary follow up via epic in 2-3 weeks.   Acute hypoxemic respiratory failure.  Related to copd exacerbation? Vs worsening tracheomalacia. Has not required nasal cannula O2 at  baseline, with ambulation in the emergency department, SPO2 dropped to 80%. On day of discharge sats dropped to 86% with ambulation on room air. Will discharge with home oxygen. Have requested pulmonary follow up apt via epic. Slow prednisone taper. Continue home inhalers. Cant tolerate albuterol.  Chest pain/abnormal EKG. Resolved quickly. Hx cad, afib, hyperlipidemia, stroke. Troponin negative x2. ekg without acute changes. Ems reported ekg with afib and a pause with rate range 60-100. No events on tele. No further chest pain. Has appointment with Dr. Zenia Resides 4/22  CKD stage 3 -Baseline creatinine 1.4 -Stable  Chronic atrial fibrillation status post ablation -Continue amiodarone, metoprolol. inr 2.9 on day of discharge. See above  Chronic diastolic heart failure appears compensated. Echocardiogram EF 60 to 82%, grade 2 diastolic dysfunction 11/9369. Continue Lasix  Hyperlipidemia -Continue Crestor  Hypothyroidism -Continue Synthroid   Procedures:    Consultations:    Discharge Exam: Vitals:   06/13/18 0658 06/13/18 0817  BP: (!) 158/70   Pulse:    Resp:    Temp:    SpO2:  93%    General: sitting on side of bed in no acute distress Cardiovascular: irregularly irregular no mgr 2+ LE edema Respiratory: normal effort with conversation. bs clear with good air movement, BS slightly distant. No crackles or wheezes  Discharge Instructions   Discharge Instructions    Ambulatory referral to Pulmonology   Complete by:  As directed    Follow up visit 2-3 weeks.   Diet - low sodium heart healthy   Complete by:  As directed    Increase activity slowly  Complete by:  As directed      Allergies as of 06/13/2018      Reactions   Ace Inhibitors Cough   Albuterol Palpitations, Other (See Comments)   Atrial fibrillation   Albuterol Sulfate Palpitations, Other (See Comments)   Causes A-Fib   Oysters [shellfish Allergy] Nausea And Vomiting   Sulfa Antibiotics Rash       Medication List    TAKE these medications   amiodarone 200 MG tablet Commonly known as:  PACERONE Take 0.5 tablets (100 mg total) by mouth daily. What changed:  See the new instructions.   azithromycin 500 MG tablet Commonly known as:  ZITHROMAX Take 1 tablet (500 mg total) by mouth at bedtime.   calcitRIOL 0.25 MCG capsule Commonly known as:  ROCALTROL Take 0.25 mcg by mouth at bedtime.   COQ10 PO Take 1 capsule by mouth at bedtime.   estradiol 0.1 MG/GM vaginal cream Commonly known as:  ESTRACE VAGINAL Apply pea-sized amount to vagina / urethra 2-3 x weekly to prevent labial adhesions What changed:    how to take this  when to take this  additional instructions   furosemide 40 MG tablet Commonly known as:  LASIX Take 80 mg by mouth daily.   Fusion Plus Caps Take 1 capsule by mouth every 7 (seven) days.   KRILL OIL OMEGA-3 PO Take 1 capsule by mouth daily with breakfast.   levothyroxine 50 MCG tablet Commonly known as:  SYNTHROID, LEVOTHROID Take 50 mcg by mouth daily before breakfast.   Magnesium 100 MG Caps Take 100 mg by mouth 2 (two) times daily.   metoprolol tartrate 25 MG tablet Commonly known as:  LOPRESSOR Take 25 mg by mouth 2 (two) times daily.   potassium chloride SA 20 MEQ tablet Commonly known as:  K-DUR,KLOR-CON Take 20 mEq by mouth daily.   predniSONE 10 MG tablet Commonly known as:  DELTASONE Take 4 tabs for 3 days starting 06/14/18, then take 3 tabs for 3 days then take 2 tabs for 3 days then take 1 tab for 3 days.   rosuvastatin 10 MG tablet Commonly known as:  CRESTOR Take 10 mg by mouth at bedtime.   tiotropium 18 MCG inhalation capsule Commonly known as:  SPIRIVA Place 1 capsule (18 mcg total) into inhaler and inhale daily.   Turmeric 500 MG Caps Take 500 mg by mouth 2 (two) times daily.   warfarin 3 MG tablet Commonly known as:  COUMADIN Take 1.5-3 mg by mouth See admin instructions. Take 1.5 mg by mouth in the evening on  Sun/Wed and 3 mg on Mon/Tues/Thurs/Fri/Sat            Durable Medical Equipment  (From admission, onward)         Start     Ordered   06/13/18 1044  For home use only DME oxygen  Once    Comments:  With activity  Question Answer Comment  Mode or (Route) Nasal cannula   Liters per Minute 2   Frequency Continuous (stationary and portable oxygen unit needed)   Oxygen delivery system Gas      06/13/18 1043         Allergies  Allergen Reactions  . Ace Inhibitors Cough  . Albuterol Palpitations and Other (See Comments)    Atrial fibrillation   . Albuterol Sulfate Palpitations and Other (See Comments)    Causes A-Fib  . Oysters [Shellfish Allergy] Nausea And Vomiting  . Sulfa Antibiotics Rash   Follow-up Information  [facility] Follow up.   Why:  Home Oxygen arranged. Portable tank will be delivered to bedside prior to discharge home.  Contact information: [address], [place], [place] [address] [phone]       [facility] Follow up.   Why:  home health services arranged, RN,PT [phone]           The results of significant diagnostics from this hospitalization (including imaging, microbiology, ancillary and laboratory) are listed below for reference.    Significant Diagnostic Studies: Dg Chest Port 1 View  Result Date: 06/12/2018 CLINICAL DATA:  Chest pain EXAM: PORTABLE CHEST 1 VIEW COMPARISON:  09/30/2016 FINDINGS: Left pacer remains in place, unchanged. Cardiomegaly. No confluent airspace opacities or effusions. No acute bony abnormality. IMPRESSION: Cardiomegaly.  No active disease. Electronically Signed   By: Rolm Baptise M.D.   On: 06/12/2018 14:09    Microbiology: Recent Results (from the past 240 hour(s))  MRSA PCR Screening     Status: None   Collection Time: 06/12/18  5:04 PM  Result Value Ref Range Status   MRSA by PCR NEGATIVE NEGATIVE Final    Comment:        The GeneXpert MRSA Assay (FDA approved for NASAL specimens only),  is one component of a comprehensive MRSA colonization surveillance program. It is not intended to diagnose MRSA infection nor to guide or monitor treatment for MRSA infections. Performed at [facility], [place] [address] [place], [place] [address]      Labs: Basic Metabolic Panel: Recent Labs  Lab 06/12/18 1317 06/13/18 0236  NA 140 140  K 3.8 3.5  CL 99 100  CO2 27 29  GLUCOSE 116* 173*  BUN 23 28*  CREATININE 1.56* 1.71*  CALCIUM 9.8 9.1   Liver Function Tests: No results for input(s): AST, ALT, ALKPHOS, BILITOT, PROT, ALBUMIN in the last 168 hours. No results for input(s): LIPASE, AMYLASE in the last 168 hours. No results for input(s): AMMONIA in the last 168 hours. CBC: Recent Labs  Lab 06/12/18 1317 06/13/18 0236  WBC 6.1 5.5  HGB 12.8 12.2  HCT 40.6 37.2  MCV 100.7* 96.4  PLT 211 205   Cardiac Enzymes: Recent Labs  Lab 06/12/18 1317 06/12/18 1613  TROPONINI <0.03 <0.03   BNP: BNP (last 3 results) No results for input(s): BNP in the last 8760 hours.  ProBNP (last 3 results) No results for input(s): PROBNP in the last 8760 hours.  CBG: No results for input(s): GLUCAP in the last 168 hours.     SignedRadene Gunning NP [facility] 06/13/2018, 12:38 PM

## 2018-06-13 NOTE — Progress Notes (Signed)
Pacer interrogated per MD order. Results were placed in patient's chart

## 2018-06-13 NOTE — Telephone Encounter (Signed)
Spoke with Alvis Lemmings, RN. Dr. Tamala Julian has ordered a PPM interrogation before patient leaves the hospital today. Advised that I will reach out to [place], [place], for assistance as we do not currently have industry support in the hospital. Greta aware. Pt currently admitted to 5W14.  Spoke with [place], [place], who will investigate further.

## 2018-06-13 NOTE — Telephone Encounter (Signed)
Informed patient of compliance results and verbalized understanding was indicated. Patient is aware and agreeable to AHI being within range at 5.5. Patient is aware and agreeable to being in compliance with machine usage Patient is aware and agreeable to no change in current pressures

## 2018-06-13 NOTE — Evaluation (Signed)
Physical Therapy Evaluation Patient Details Name: Lynn Dunn MRN: [id number] DOB: 03-Jun-1930 Today's Date: 06/13/2018   History of Present Illness  Pt is an 85 y/o female admitted secondary to COPD exacerbation. PMH includes a fib, COPD, HTN, and CVA.   Clinical Impression  Pt admitted secondary to problem above with deficits below. Pt required supervision for gait and  Standing bathing tasks at sink. Educated about energy conservation techniques, pursed lip breathing, and safe management of oxygen tubing at home. Will continue to follow acutely to maximize functional mobility independence and safety.     Follow Up Recommendations Home health PT;Supervision for mobility/OOB(safety eval)    Equipment Recommendations  None recommended by PT    Recommendations for Other Services       Precautions / Restrictions Precautions Precautions: Fall Restrictions Weight Bearing Restrictions: No      Mobility  Bed Mobility               General bed mobility comments: Sitting at sink upon entry.   Transfers Overall transfer level: Needs assistance Equipment used: None Transfers: Sit to/from Stand Sit to Stand: Supervision         General transfer comment: Supervision for safety. Pt able to perform dynamic bathing tasks in standing with supervision.   Ambulation/Gait Ambulation/Gait assistance: Supervision Gait Distance (Feet): 100 Feet Assistive device: None Gait Pattern/deviations: Step-through pattern;Decreased stride length Gait velocity: Decreased    General Gait Details: Slow, overall steady gait. No overt LOB noted. Educated about energy conservation during mobility tasks and how to manage oxygen tubing at home during mobility.   Stairs            Wheelchair Mobility    Modified Rankin (Stroke Patients Only)       Balance Overall balance assessment: Needs assistance Sitting-balance support: No upper extremity supported;Feet supported Sitting  balance-Leahy Scale: Good     Standing balance support: No upper extremity supported;During functional activity Standing balance-Leahy Scale: Fair                               Pertinent Vitals/Pain Pain Assessment: No/denies pain    Home Living Family/patient expects to be discharged to:: Private residence Living Arrangements: Alone Available Help at Discharge: Friend(s);Available PRN/intermittently Type of Home: Independent living facility Home Access: Level entry     Home Layout: One level Home Equipment: Walker - 4 wheels;Shower seat Additional Comments: From [facility]    Prior Function Level of Independence: Independent with assistive device(s)         Comments: Reports using 3 wheel walker at baseline. Is independent with ADLs. Has her meals prepared at ILF.     Hand Dominance        Extremity/Trunk Assessment   Upper Extremity Assessment Upper Extremity Assessment: Defer to OT evaluation    Lower Extremity Assessment Lower Extremity Assessment: Generalized weakness    Cervical / Trunk Assessment Cervical / Trunk Assessment: Normal  Communication   Communication: No difficulties  Cognition Arousal/Alertness: Awake/alert Behavior During Therapy: WFL for tasks assessed/performed Overall Cognitive Status: Within Functional Limits for tasks assessed                                        General Comments General comments (skin integrity, edema, etc.): Educated about pursed lip breathing techniques for home.  Exercises     Assessment/Plan    PT Assessment Patient needs continued PT services  PT Problem List Cardiopulmonary status limiting activity;Decreased strength;Decreased activity tolerance;Decreased mobility;Decreased knowledge of precautions       PT Treatment Interventions Gait training;DME instruction;Functional mobility training;Therapeutic activities;Balance training;Therapeutic  exercise;Patient/family education    PT Goals (Current goals can be found in the Care Plan section)  Acute Rehab PT Goals Patient Stated Goal: to go home PT Goal Formulation: With patient Time For Goal Achievement: 06/27/18 Potential to Achieve Goals: Good    Frequency Min 3X/week   Barriers to discharge Decreased caregiver support      Co-evaluation               AM-PAC PT "6 Clicks" Mobility  Outcome Measure Help needed turning from your back to your side while in a flat bed without using bedrails?: A Little Help needed moving from lying on your back to sitting on the side of a flat bed without using bedrails?: A Little Help needed moving to and from a bed to a chair (including a wheelchair)?: None Help needed standing up from a chair using your arms (e.g., wheelchair or bedside chair)?: None Help needed to walk in hospital room?: None Help needed climbing 3-5 steps with a railing? : A Little 6 Click Score: 21    End of Session Equipment Utilized During Treatment: Oxygen Activity Tolerance: Patient tolerated treatment well Patient left: in bed;with call bell/phone within reach;with nursing/sitter in room(sitting EOB ) Nurse Communication: Mobility status PT Visit Diagnosis: Other abnormalities of gait and mobility (R26.89)    Time: 7342-8768 PT Time Calculation (min) (ACUTE ONLY): 15 min   Charges:   PT Evaluation $PT Eval Low Complexity: [place], PT, DPT  Acute Rehabilitation Services  Pager: [phone] Office: [phone]   Rudean Hitt 06/13/2018, 12:32 PM

## 2018-06-13 NOTE — Telephone Encounter (Signed)
[facility] transmission was performed per nursing progress notes in Epic.

## 2018-06-13 NOTE — ED Provider Notes (Cosign Needed)
[facility] Provider Note   CSN: [id number] Arrival date & time: 05/12/12  2356    History   Chief Complaint Chief Complaint  Patient presents with  . Irregular Heart Beat    HPI Lynn Dunn is a 85 y.o. female.     85 year old female with past medical history of obesity, COPD tracheomalacia, hyperlipidemia, A. fib with pacemaker presents emergency department for shortness of breath.  She reports this is been going on for last 3 days.  Reports that she has had elevated blood pressure as well so she saw her primary care doctor yesterday.  Reports that she doubled up on her blood pressure medication but it was still elevated.  Reports that her primary care doctor performed an EKG and that it was "abnormal" and so she was sent to the emergency department.  She reports that that shortness of breath is worse with exertion.  She denies any chest pain.  Denies any fevers, chills, sick contacts, nausea, vomiting.  Denies any leg swelling.  She has not tried anything for relief.  She takes a Spiriva inhaler and states that she gets a bad reaction to albuterol.     Past Medical History:  Diagnosis Date  . Anemia   . Anginal pain ([place]) 2011   had to take nitro   . Arthritis   . COPD (chronic obstructive pulmonary disease) ([place])   . Dysuria   . GERD (gastroesophageal reflux disease)   . Hematuria   . HTN (hypertension)   . Hx of echocardiogram    Echo (12/15):  Mild LVH, EF 60-65%, mild MR, mod LAE (LA 49 mm), PASP 43 mmHg  . Obesity   . OSA (obstructive sleep apnea)    cpap at home  . PAF (paroxysmal atrial fibrillation) ([place])    s/p ablation 07/29/09  . Sick sinus syndrome ([facility])    pauses with syncope  . Stroke ([place]) 6/09   NO RESIDUAL PROBLEMS    Patient Active Problem List   Diagnosis Date Noted  . COPD exacerbation ([place]) 06/12/2018  . Restrictive lung disease 01/18/2017  . 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/07/2016  . COPD (chronic obstructive  pulmonary disease) ([place]) 04/05/2016  . Subtherapeutic international normalized ratio (INR) 04/05/2016  . Atrial fibrillation with rapid ventricular response ([place]) 04/04/2016  . Carotid artery disease ([place]) 11/25/2015  . Pre-syncope 02/16/2015  . Sick sinus syndrome ([place]) 02/15/2015  . Swelling of right lower extremity 08/31/2014  . Mixed hyperlipidemia 12/27/2012  . Obesity 12/27/2012  . GI bleed 01/23/2012  . Anemia 01/23/2012  . History of CVA (cerebrovascular accident) 01/23/2012  . SHORTNESS OF BREATH (SOB) 09/13/2009  . Essential hypertension, benign 06/17/2009  . Atrial fibrillation ([employer]) 06/17/2009  . OSA (obstructive sleep apnea) 06/17/2009    Past Surgical History:  Procedure Laterality Date  . ABDOMINAL HYSTERECTOMY    . ATRIAL ABLATION SURGERY     s/p ablation for afib 07/29/09  . CYSTOSCOPY N/A 06/23/2015   Procedure: CYSTOSCOPY;  Surgeon: Alexis Frock, MD;  Location: [facility]S;  Service: Urology;  Laterality: N/A;  . EP IMPLANTABLE DEVICE N/A 02/17/2015   MDT Advisa MRI PPM implanted by Dr Lovena Le for sick sinus and syncope  . EP IMPLANTABLE DEVICE N/A 02/17/2015   Procedure: Loop Recorder Removal;  Surgeon: Evans Lance, MD;  Location: [facility] CV LAB;  Service: Cardiovascular;  Laterality: N/A;  . ESOPHAGOGASTRODUODENOSCOPY  01/24/2012   Procedure: ESOPHAGOGASTRODUODENOSCOPY (EGD);  Surgeon: Jeryl Columbia, MD;  Location: [facility] ENDOSCOPY;  Service: Endoscopy;  Laterality: N/A;  . KNEE ARTHROSCOPY     BILATERAL  . LOOP RECORDER IMPLANT N/A 03/12/2014   Procedure: LOOP RECORDER IMPLANT;  Surgeon: Thompson Grayer, MD;  Location: [facility] CATH LAB;  Service: Cardiovascular;  Laterality: N/A;  . LYSIS OF ADHESION N/A 06/23/2015   Procedure: LYSIS OF LABIAL ADHESION;  Surgeon: Alexis Frock, MD;  Location: [facility]S;  Service: Urology;  Laterality: N/A;  . VIDEO BRONCHOSCOPY Bilateral 12/15/2016   Procedure: VIDEO BRONCHOSCOPY WITHOUT FLUORO;  Surgeon: Collene Gobble, MD;  Location: [facility]  ENDOSCOPY;  Service: Cardiopulmonary;  Laterality: Bilateral;     OB History   No obstetric history on file.      Home Medications    Prior to Admission medications   Medication Sig Start Date End Date Taking? Authorizing Provider  Coenzyme Q10 (COQ10 PO) Take 1 capsule by mouth at bedtime.    Yes [provider]  amiodarone (PACERONE) 200 MG tablet TAKE 1/2 TABLET ONE TIME DAILY Patient taking differently: Take 100 mg by mouth daily.  06/11/18   Allred, Jeneen Rinks, MD  calcitRIOL (ROCALTROL) 0.25 MCG capsule Take 0.25 mcg by mouth at bedtime.  05/07/17   [provider]  estradiol (ESTRACE VAGINAL) 0.1 MG/GM vaginal cream Apply pea-sized amount to vagina / urethra 2-3 x weekly to prevent labial adhesions Patient taking differently: Place vaginally See admin instructions. Apply a pea-sized amount to vagina/urethra 2-3 times a week to prevent labial adhesions 06/23/15   Alexis Frock, MD  furosemide (LASIX) 40 MG tablet Take 80 mg by mouth daily.     [provider]  Iron-FA-B Cmp-C-Biot-Probiotic (FUSION PLUS) CAPS Take 1 capsule by mouth every 7 (seven) days.  03/26/18   [provider]  KRILL OIL OMEGA-3 PO Take 1 capsule by mouth daily with breakfast.     [provider]  levothyroxine (SYNTHROID, LEVOTHROID) 50 MCG tablet Take 50 mcg by mouth daily before breakfast.  05/13/18   [provider]  Magnesium 100 MG CAPS Take 100 mg by mouth 2 (two) times daily.     [provider]  metoprolol tartrate (LOPRESSOR) 25 MG tablet Take 25 mg by mouth 2 (two) times daily.    [provider]  potassium chloride SA (K-DUR,KLOR-CON) 20 MEQ tablet Take 20 mEq by mouth daily.     [provider]  rosuvastatin (CRESTOR) 10 MG tablet Take 10 mg by mouth at bedtime.  09/14/16   [provider]  tiotropium (SPIRIVA) 18 MCG inhalation capsule Place 1 capsule (18 mcg total) into inhaler and inhale daily. 11/26/17   Collene Gobble, MD  Turmeric 500 MG CAPS Take 500 mg by mouth 2 (two) times daily.    [provider]  warfarin (COUMADIN) 3 MG tablet Take 1.5-3 mg by mouth See admin instructions. Take 1.5 mg by mouth in the evening on Sun/Wed and 3 mg on Mon/Tues/Thurs/Fri/Sat    [provider]    Family History Family History  Problem Relation Age of Onset  . Breast cancer Mother   . Brain cancer Father   . Breast cancer Other   . Brain cancer Other   . Stroke Maternal Aunt   . Hypertension Sister   . Heart attack Neg Hx     Social History Social History   Tobacco Use  . Smoking status: Never Smoker  . Smokeless tobacco: Never Used  Substance Use Topics  . Alcohol use: No  . Drug use: No  Allergies   Ace inhibitors; Albuterol; Albuterol sulfate; Oysters [shellfish allergy]; and Sulfa antibiotics   Review of Systems Review of Systems  Constitutional: Positive for fatigue. Negative for chills and fever.  HENT: Negative for ear pain, rhinorrhea and sore throat.   Eyes: Negative for pain and visual disturbance.  Respiratory: Positive for shortness of breath. Negative for cough, chest tightness and wheezing.   Cardiovascular: Negative for chest pain and palpitations.  Gastrointestinal: Negative for abdominal pain, diarrhea, nausea and vomiting.  Genitourinary: Negative for dysuria and hematuria.  Musculoskeletal: Negative for arthralgias and back pain.  Skin: Negative for color change and rash.  Neurological: Negative for seizures and syncope.  All other systems reviewed and are negative.    Physical Exam Updated Vital Signs BP (!) 115/56 (BP Location: Left arm)   Pulse 67   Temp 98.1 F (36.7 C) (Oral)   Resp 16   Ht 5\' 2"  (1.575 m)   Wt 90.1 kg   SpO2 98%   BMI 36.32 kg/m   Physical Exam Vitals signs and nursing note reviewed.  Constitutional:      General: She is not in acute distress.    Appearance: Normal appearance. She is obese. She is not  ill-appearing, toxic-appearing or diaphoretic.  HENT:     Head: Normocephalic.     Mouth/Throat:     Mouth: Mucous membranes are moist.     Pharynx: Oropharynx is clear.  Eyes:     Conjunctiva/sclera: Conjunctivae normal.  Cardiovascular:     Rate and Rhythm: Normal rate.  Pulmonary:     Effort: Pulmonary effort is normal. No respiratory distress.     Breath sounds: Normal breath sounds.  Abdominal:     General: Abdomen is flat. Bowel sounds are normal.  Musculoskeletal:     Right lower leg: Edema present.     Left lower leg: Edema present.  Skin:    General: Skin is dry.  Neurological:     Mental Status: She is alert.  Psychiatric:        Mood and Affect: Mood normal.      ED Treatments / Results  Labs (all labs ordered are listed, but only abnormal results are displayed) Labs Reviewed  COMPREHENSIVE METABOLIC PANEL - Abnormal; Notable for the following components:      Result Value   Potassium 3.2 (*)    Glucose, Bld 135 (*)    BUN 24 (*)    GFR calc non Af Amer 52 (*)    GFR calc Af Amer 60 (*)    All other components within normal limits  PROTIME-INR - Abnormal; Notable for the following components:   Prothrombin Time 31.7 (*)    INR 3.30 (*)    All other components within normal limits  URINALYSIS, ROUTINE W REFLEX MICROSCOPIC - Abnormal; Notable for the following components:   Hgb urine dipstick TRACE (*)    Leukocytes, UA SMALL (*)    All other components within normal limits  GLUCOSE, CAPILLARY - Abnormal; Notable for the following components:   Glucose-Capillary 110 (*)    All other components within normal limits  CBC - Abnormal; Notable for the following components:   RBC 3.48 (*)    Hemoglobin 10.0 (*)    HCT 31.4 (*)    RDW 18.8 (*)    All other components within normal limits  BASIC METABOLIC PANEL - Abnormal; Notable for the following components:   Glucose, Bld 101 (*)    Calcium 8.3 (*)  GFR calc non Af Amer 65 (*)    GFR calc Af Amer 76  (*)    All other components within normal limits  PROTIME-INR - Abnormal; Notable for the following components:   Prothrombin Time 30.7 (*)    INR 3.16 (*)    All other components within normal limits  TROPONIN I  URINE MICROSCOPIC-ADD ON  GLUCOSE, CAPILLARY    EKG None  Radiology Dg Chest Port 1 View  Result Date: 06/12/2018 CLINICAL DATA:  Chest pain EXAM: PORTABLE CHEST 1 VIEW COMPARISON:  09/30/2016 FINDINGS: Left pacer remains in place, unchanged. Cardiomegaly. No confluent airspace opacities or effusions. No acute bony abnormality. IMPRESSION: Cardiomegaly.  No active disease. Electronically Signed   By: Rolm Baptise M.D.   On: 06/12/2018 14:09    Procedures Procedures (including critical care time)  Medications Ordered in ED Medications  ondansetron (ZOFRAN) injection 4 mg (has no administration in time range)  diltiazem (CARDIZEM) 30 MG tablet (30 mg  Given 05/13/12 0150)  acetaminophen (TYLENOL) 325 MG tablet (650 mg  Given 05/13/12 0223)  potassium chloride SA (K-DUR,KLOR-CON) CR tablet 40 mEq (40 mEq Oral Given 05/13/12 0345)  sodium chloride 0.9 % bolus 250 mL (250 mLs Intravenous Given 05/13/12 1015)     Initial Impression / Assessment and Plan / ED Course  I have reviewed the triage vital signs and the nursing notes.  Pertinent labs & imaging results that were available during my care of the patient were reviewed by me and considered in my medical decision making (see chart for details).          Final Clinical Impressions(s) / ED Diagnoses   Final diagnoses:  Atrial fibrillation with RVR  Near syncope  Dehydration    ED Discharge Orders         Ordered    metoprolol (LOPRESSOR) 25 MG tablet  2 times daily,   Status:  Discontinued     05/14/12 1237    furosemide (LASIX) 20 MG tablet  Daily,   Status:  Discontinued     05/14/12 1237    Increase activity slowly     05/14/12 1237    Diet - low sodium heart healthy     05/14/12 1237    Discharge  instructions    Comments:  Please be sure to follow up with your cardiologist for further evaluation and recommendations.   05/14/12 1237    Call MD for:  temperature >100.4     05/14/12 1237    Call MD for:  difficulty breathing, headache or visual disturbances     05/14/12 1237    Call MD for:  persistant dizziness or light-headedness     05/14/12 1237           Alveria Apley, PA-C 06/13/18 [phone]

## 2018-06-13 NOTE — Care Management Obs Status (Signed)
[facility] NOTIFICATION   Patient Details  Name: Lynn Dunn MRN: [id number] Date of Birth: 1930/07/02   Medicare Observation Status Notification Given:  Yes    Carles Collet, RN 06/13/2018, 10:31 AM

## 2018-06-13 NOTE — Progress Notes (Signed)
ANTICOAGULATION CONSULT NOTE - [place] for warfarin Indication: atrial fibrillation  Allergies  Allergen Reactions  . Ace Inhibitors Cough  . Albuterol Palpitations and Other (See Comments)    Atrial fibrillation   . Albuterol Sulfate Palpitations and Other (See Comments)    Causes A-Fib  . Oysters [Shellfish Allergy] Nausea And Vomiting  . Sulfa Antibiotics Rash    Patient Measurements:     Vital Signs: Temp: 97.6 F (36.4 C) (04/09 0618) Temp Source: Oral (04/09 0618) BP: 158/70 (04/09 0658) Pulse Rate: 62 (04/09 0618)  Labs: Recent Labs    06/12/18 1317 06/12/18 1613 06/12/18 1821 06/13/18 0236  HGB 12.8  --   --  12.2  HCT 40.6  --   --  37.2  PLT 211  --   --  205  LABPROT  --   --  31.6* 29.8*  INR  --   --  3.1* 2.9*  CREATININE 1.56*  --   --  1.71*  TROPONINI <0.03 <0.03  --   --     CrCl cannot be calculated (Unknown ideal weight.).   Medical History: Past Medical History:  Diagnosis Date  . Airway malacia   . Anemia   . Anginal pain ([place]) 2011   had to take nitro   . Arthritis   . COPD (chronic obstructive pulmonary disease) ([place])   . Dysuria   . GERD (gastroesophageal reflux disease)   . Hematuria   . HTN (hypertension)   . Hx of echocardiogram    Echo (12/15):  Mild LVH, EF 60-65%, mild MR, mod LAE (LA 49 mm), PASP 43 mmHg  . Obesity   . OSA (obstructive sleep apnea)    cpap at home  . PAF (paroxysmal atrial fibrillation) ([place])    s/p ablation 07/29/09  . Sick sinus syndrome ([facility])    pauses with syncope  . Stroke ([place]) 6/09   NO RESIDUAL PROBLEMS   Assessment: 85 yo female with history of PAF on warfarin PTA. Home regimen of 1.5mg  on Sun and Wednesday, and 3mg  on all other days confirmed with patient.   INR 2.9  Will continue with lower dose given possible DDI with Azithromycin.  Goal of Therapy:  INR 2-3 Monitor platelets by anticoagulation protocol: Yes   Plan:  Warfarin 1.5mg  PO x 1  tonight Daily INR Monitor for s/sx of bleeding  [employer], Pharm.D., BCPS Clinical Pharmacist Clinical phone for 06/13/2018 from 8:30-4:00 is [phone].  **Pharmacist phone directory can now be found on [email or web address] ([email or web address]).  Listed under [place].  06/13/2018 10:52 AM

## 2018-06-13 NOTE — Progress Notes (Signed)
SATURATION QUALIFICATIONS: (This note is used to comply with regulatory documentation for home oxygen)  Patient Saturations on Room Air at Rest = 96%  Patient Saturations on Room Air while Ambulating = 87%  Patient Saturations on 2 Liters of oxygen while Ambulating = 95%  Please briefly explain why patient needs home oxygen: when ambulating patient was complaining of SOB and sat O2 dropped under 90%.

## 2018-06-13 NOTE — Progress Notes (Signed)
Patient was discharged to [place] by MD order; discharged instructions review and give to patient with care notes; IV DIC; skin intact; patient will be escorted to the car by nurse tech via wheelchair.

## 2018-06-13 NOTE — Telephone Encounter (Signed)
-----   Message from Sueanne Margarita, MD sent at 06/13/2018 11:11 AM EDT ----- Good AHI and compliance.  Continue current PAP settings.

## 2018-06-13 NOTE — Telephone Encounter (Signed)
New Message:   Patient is in the hospital at [facility] and the nurse would like for some one to call concerning patient pacemaker. I tried calling triage could not get any one. Please call Ms. Lynn Dunn at [phone].

## 2018-06-13 NOTE — TOC Transition Note (Signed)
Transition of Care [facility]) - CM/SW Discharge Note   Patient Details  Name: Lynn Dunn MRN: [id number] Date of Birth: June 11, 1930  [facility] CM/SW Contact:  Sharin Mons, RN Phone Number: 06/13/2018, 11:39 AM   Clinical Narrative:    Transition to home, [employer]. Home health services to follow, [place]. Resides alone. Portable oxygen tank will be delivered to beside prior to discharge. Pt states has transportation to home by [employer].  Lind Covert (Sister) [place] [facility])    [phone]      Final next level of care: Home w Home Health Services Barriers to Discharge: Barriers Resolved   Patient Goals and CMS Choice Patient states their goals for this hospitalization and ongoing recovery are:: to go home and care for myself CMS Medicare.gov Compare Post Acute Care list provided to:: Patient Choice offered to / list presented to : Patient  Discharge Placement                       Discharge Plan and Services In-house Referral: NA Discharge Planning Services: CM Consult Post Acute Care Choice: Durable Medical Equipment, Home Health          DME Arranged: Oxygen DME Agency: [facility] HH Arranged: RN, PT [place] Agency: [facility] ([facility])   Social Determinants of Health (SDOH) Interventions     Readmission Risk Interventions No flowsheet data found.

## 2018-06-13 NOTE — Progress Notes (Signed)
Initial Nutrition Assessment  RD working remotely.  DOCUMENTATION CODES:   Morbid obesity  INTERVENTION:   -Glucerna Shake po BID, each supplement provides 220 kcal and 10 grams of protein -MVI with minerals daily  NUTRITION DIAGNOSIS:   Increased nutrient needs related to chronic illness(COPD) as evidenced by estimated needs.  GOAL:   Patient will meet greater than or equal to 90% of their needs  MONITOR:   PO intake, Supplement acceptance, Labs, Weight trends, Skin, I & O's  REASON FOR ASSESSMENT:   Consult Assessment of nutrition requirement/status, COPD Protocol  ASSESSMENT:   Lynn Dunn is a 85 y.o. female with medical history significant of COPD, paroxysmal atrial fibrillation status post ablation, OSA, who presents with a few week history of worsening shortness of breath with exertion.  She states that she has had exertional dyspnea for about 5 years due to her COPD but worsening in the past few weeks.  She admits to a dry cough due to her allergies.  No fevers at home or productive cough, had some indigestion chest pain which lasted for 20 minutes yesterday and resolved spontaneously.  Denies any nausea, vomiting, diarrhea or abdominal pain.  Denies any sick contacts.  Pt admitted with COPD exacerbation.  Reviewed I/O's: -210 ml x 24 hours  UOP: 700 ml x 24 hours  Attempted to speak with pt by phone, however, pt did not answer. Message reported "call cannot be completed as dialed". Unable to obtain further nutrition history at this time.   PTA, pt resided at [facility]. Per chart review, last documented wt was recorded in 08/2017. No recent wt hx available to assess weight changes.   No documented meal intake to assess.   Due to increased nutrient needs for COPD, pt would benefit from addition of nutritional supplements.   Medications reviewed and include prednisone.   Lab Results  Component Value Date   HGBA1C (H) 09/06/2007   6.2 (NOTE)   The ADA recommends the following therapeutic goal for glycemic   control related to Hgb A1C measurement:   Goal of Therapy:   < 7.0% Hgb A1C   Reference: American Diabetes Association: Clinical Practice   Recommendations 2008, Diabetes Care,  2008, 31:(Suppl 1).  PTA DM medications are none.   Labs reviewed.   NUTRITION - FOCUSED PHYSICAL EXAM:    Most Recent Value  Orbital Region  Unable to assess  Upper Arm Region  Unable to assess  Thoracic and Lumbar Region  Unable to assess  Buccal Region  Unable to assess  Temple Region  Unable to assess  Clavicle Bone Region  Unable to assess  Clavicle and Acromion Bone Region  Unable to assess  Scapular Bone Region  Unable to assess  Dorsal Hand  Unable to assess  Patellar Region  Unable to assess  Anterior Thigh Region  Unable to assess  Posterior Calf Region  Unable to assess  Edema (RD Assessment)  Unable to assess  Hair  Unable to assess  Eyes  Unable to assess  Mouth  Unable to assess  Skin  Unable to assess  Nails  Unable to assess       Diet Order:   Diet Order            Diet Heart Room service appropriate? Yes; Fluid consistency: Thin  Diet effective now              EDUCATION NEEDS:   No education needs have been identified at this  time  Skin:  Skin Assessment: Reviewed RN Assessment  Last BM:  06/11/18  Height:   Ht Readings from Last 1 Encounters:  08/28/17 5' (1.524 m)    Weight:   Wt Readings from Last 1 Encounters:  08/28/17 95.7 kg    Ideal Body Weight:  45.5 kg  BMI:  Estimated body mass index is 41.21 kg/m as calculated from the following:   Height as of 08/28/17: 5' (1.524 m).   Weight as of 08/28/17: 95.7 kg.  Estimated Nutritional Needs:   Kcal:  1600-1800  Protein:  75-90 grams  Fluid:  1.6-1.8 L    Afsa Meany A. Jimmye Norman, RD, LDN, [place] Registered Dietitian II Certified Diabetes Care and Education Specialist Pager: [phone] After hours Pager: [phone]

## 2018-06-13 NOTE — Evaluation (Signed)
Occupational Therapy Evaluation Patient Details Name: Lynn Dunn MRN: [id number] DOB: Aug 21, 1930 Today's Date: 06/13/2018    History of Present Illness Pt is an 85 y/o female admitted secondary to COPD exacerbation. PMH includes a fib, COPD, HTN, and CVA.    Clinical Impression   Pt PTA: living in ILF and performing all ADL with no assist and IADLs (partly) provided for her. Pt desatting to 85% on RA. Pt performing ADL as baseline with set-upA to modified independence. Pt does not require continued OT skilled services for ADL and mobility. OT signing off.    Follow Up Recommendations  No OT follow up;Supervision - Intermittent    Equipment Recommendations       Recommendations for Other Services       Precautions / Restrictions Precautions Precautions: Fall Restrictions Weight Bearing Restrictions: No      Mobility Bed Mobility Overal bed mobility: Modified Independent                Transfers Overall transfer level: Needs assistance Equipment used: None Transfers: Sit to/from Stand Sit to Stand: Supervision         General transfer comment: Supervision for safety. Pt able to perform dynamic bathing tasks in standing with supervision.     Balance Overall balance assessment: Needs assistance   Sitting balance-Leahy Scale: Good       Standing balance-Leahy Scale: Fair                             ADL either performed or assessed with clinical judgement   ADL Overall ADL's : At baseline                                       General ADL Comments: O2 sats desat to 85% on RA requiring 2L o2 to increase >90%.     Vision Baseline Vision/History: No visual deficits Vision Assessment?: No apparent visual deficits     Perception     Praxis      Pertinent Vitals/Pain Pain Assessment: No/denies pain     Hand Dominance     Extremity/Trunk Assessment Upper Extremity Assessment Upper Extremity Assessment: Generalized  weakness   Lower Extremity Assessment Lower Extremity Assessment: Generalized weakness   Cervical / Trunk Assessment Cervical / Trunk Assessment: Normal   Communication Communication Communication: No difficulties   Cognition Arousal/Alertness: Awake/alert Behavior During Therapy: WFL for tasks assessed/performed Overall Cognitive Status: Within Functional Limits for tasks assessed                                     General Comments       Exercises     Shoulder Instructions      Home Living Family/patient expects to be discharged to:: Private residence Living Arrangements: Alone Available Help at Discharge: Friend(s);Available PRN/intermittently Type of Home: Independent living facility Home Access: Level entry     Home Layout: One level     Bathroom Shower/Tub: Occupational psychologist: Handicapped height     Home Equipment: Environmental consultant - 4 wheels;Shower seat   Additional Comments: From [facility]      Prior Functioning/Environment Level of Independence: Independent with assistive device(s)        Comments: Reports using 3 wheel walker at baseline. Is  independent with ADLs. Has her meals prepared at ILF.        OT Problem List: Decreased strength;Decreased activity tolerance;Impaired balance (sitting and/or standing);Decreased coordination;Decreased safety awareness;Pain      OT Treatment/Interventions:      OT Goals(Current goals can be found in the care plan section) Acute Rehab OT Goals Patient Stated Goal: to go home  OT Frequency:     Barriers to D/C:            Co-evaluation              AM-PAC OT "6 Clicks" Daily Activity     Outcome Measure Help from another person eating meals?: None Help from another person taking care of personal grooming?: None Help from another person toileting, which includes using toliet, bedpan, or urinal?: None Help from another person bathing (including washing, rinsing,  drying)?: None Help from another person to put on and taking off regular upper body clothing?: A Little Help from another person to put on and taking off regular lower body clothing?: A Little 6 Click Score: 22   End of Session Equipment Utilized During Treatment: Gait belt Nurse Communication: Mobility status  Activity Tolerance: Patient tolerated treatment well Patient left: in bed;with call bell/phone within reach  OT Visit Diagnosis: Unsteadiness on feet (R26.81);Muscle weakness (generalized) (M62.81)                Time: 1594-5859 OT Time Calculation (min): 14 min Charges:  OT General Charges $OT Visit: 1 Visit OT Evaluation $OT Eval Moderate Complexity: 1 Mod  Darryl Nestle) Marsa Aris OTR/L Acute Rehabilitation Services Pager: [phone] Office: [place] 06/13/2018, 4:43 PM

## 2018-06-13 NOTE — Progress Notes (Signed)
EP asked to evaluate PPM secondary to reports by EMS of ?pause.  The patient admitted with SOB/CP felt 2/2 COPD +/- known tracheomalacia, neg Trop. EMS tracings reviewed, no bradycardia noted, patient has MDT dual chamber PPM in place,  remote transmission in January 2020 with good battery and lead measurements.  The patient is not pacer dependent. A paced 51%, V paces <1%   D/w Dr. Tamala Julian.  Reviewed with Dr. Lovena Le, suggest the patient send a remote transmission once home.  Tommye Standard, PA-C

## 2018-06-14 ENCOUNTER — Telehealth: Payer: Self-pay | Admitting: Internal Medicine

## 2018-06-14 DIAGNOSIS — J441 Chronic obstructive pulmonary disease with (acute) exacerbation: Secondary | ICD-10-CM | POA: Diagnosis not present

## 2018-06-14 DIAGNOSIS — J9601 Acute respiratory failure with hypoxia: Secondary | ICD-10-CM | POA: Diagnosis not present

## 2018-06-14 DIAGNOSIS — R262 Difficulty in walking, not elsewhere classified: Secondary | ICD-10-CM | POA: Diagnosis not present

## 2018-06-14 NOTE — Telephone Encounter (Signed)
New message    Called pt to offer mychart visit on 04.13.20 with Dr. Rayann Heman. Pt voicemail was full, could not leave message.

## 2018-06-17 DIAGNOSIS — R262 Difficulty in walking, not elsewhere classified: Secondary | ICD-10-CM | POA: Diagnosis not present

## 2018-06-17 DIAGNOSIS — J9601 Acute respiratory failure with hypoxia: Secondary | ICD-10-CM | POA: Diagnosis not present

## 2018-06-17 DIAGNOSIS — J441 Chronic obstructive pulmonary disease with (acute) exacerbation: Secondary | ICD-10-CM | POA: Diagnosis not present

## 2018-06-19 DIAGNOSIS — Z7901 Long term (current) use of anticoagulants: Secondary | ICD-10-CM | POA: Diagnosis not present

## 2018-06-19 DIAGNOSIS — J441 Chronic obstructive pulmonary disease with (acute) exacerbation: Secondary | ICD-10-CM | POA: Diagnosis not present

## 2018-06-19 DIAGNOSIS — I4891 Unspecified atrial fibrillation: Secondary | ICD-10-CM | POA: Diagnosis not present

## 2018-06-19 DIAGNOSIS — R262 Difficulty in walking, not elsewhere classified: Secondary | ICD-10-CM | POA: Diagnosis not present

## 2018-06-19 DIAGNOSIS — J9601 Acute respiratory failure with hypoxia: Secondary | ICD-10-CM | POA: Diagnosis not present

## 2018-06-21 ENCOUNTER — Ambulatory Visit: Payer: Medicare Other | Admitting: Cardiology

## 2018-06-21 DIAGNOSIS — J9601 Acute respiratory failure with hypoxia: Secondary | ICD-10-CM | POA: Diagnosis not present

## 2018-06-21 DIAGNOSIS — J441 Chronic obstructive pulmonary disease with (acute) exacerbation: Secondary | ICD-10-CM | POA: Diagnosis not present

## 2018-06-21 DIAGNOSIS — R262 Difficulty in walking, not elsewhere classified: Secondary | ICD-10-CM | POA: Diagnosis not present

## 2018-06-24 DIAGNOSIS — R262 Difficulty in walking, not elsewhere classified: Secondary | ICD-10-CM | POA: Diagnosis not present

## 2018-06-24 DIAGNOSIS — J441 Chronic obstructive pulmonary disease with (acute) exacerbation: Secondary | ICD-10-CM | POA: Diagnosis not present

## 2018-06-24 DIAGNOSIS — J9601 Acute respiratory failure with hypoxia: Secondary | ICD-10-CM | POA: Diagnosis not present

## 2018-06-26 ENCOUNTER — Encounter: Payer: Medicare Other | Admitting: Internal Medicine

## 2018-06-26 DIAGNOSIS — R262 Difficulty in walking, not elsewhere classified: Secondary | ICD-10-CM | POA: Diagnosis not present

## 2018-06-26 DIAGNOSIS — J9601 Acute respiratory failure with hypoxia: Secondary | ICD-10-CM | POA: Diagnosis not present

## 2018-06-26 DIAGNOSIS — J441 Chronic obstructive pulmonary disease with (acute) exacerbation: Secondary | ICD-10-CM | POA: Diagnosis not present

## 2018-06-28 DIAGNOSIS — J441 Chronic obstructive pulmonary disease with (acute) exacerbation: Secondary | ICD-10-CM | POA: Diagnosis not present

## 2018-06-28 DIAGNOSIS — J9601 Acute respiratory failure with hypoxia: Secondary | ICD-10-CM | POA: Diagnosis not present

## 2018-06-28 DIAGNOSIS — R262 Difficulty in walking, not elsewhere classified: Secondary | ICD-10-CM | POA: Diagnosis not present

## 2018-07-01 DIAGNOSIS — R262 Difficulty in walking, not elsewhere classified: Secondary | ICD-10-CM | POA: Diagnosis not present

## 2018-07-01 DIAGNOSIS — J9601 Acute respiratory failure with hypoxia: Secondary | ICD-10-CM | POA: Diagnosis not present

## 2018-07-01 DIAGNOSIS — J441 Chronic obstructive pulmonary disease with (acute) exacerbation: Secondary | ICD-10-CM | POA: Diagnosis not present

## 2018-07-03 DIAGNOSIS — J9601 Acute respiratory failure with hypoxia: Secondary | ICD-10-CM | POA: Diagnosis not present

## 2018-07-03 DIAGNOSIS — J441 Chronic obstructive pulmonary disease with (acute) exacerbation: Secondary | ICD-10-CM | POA: Diagnosis not present

## 2018-07-03 DIAGNOSIS — R262 Difficulty in walking, not elsewhere classified: Secondary | ICD-10-CM | POA: Diagnosis not present

## 2018-07-04 ENCOUNTER — Encounter: Payer: Medicare Other | Admitting: *Deleted

## 2018-07-04 ENCOUNTER — Other Ambulatory Visit: Payer: Self-pay

## 2018-07-05 DIAGNOSIS — J9601 Acute respiratory failure with hypoxia: Secondary | ICD-10-CM | POA: Diagnosis not present

## 2018-07-05 DIAGNOSIS — J441 Chronic obstructive pulmonary disease with (acute) exacerbation: Secondary | ICD-10-CM | POA: Diagnosis not present

## 2018-07-05 DIAGNOSIS — R262 Difficulty in walking, not elsewhere classified: Secondary | ICD-10-CM | POA: Diagnosis not present

## 2018-07-10 ENCOUNTER — Ambulatory Visit (INDEPENDENT_AMBULATORY_CARE_PROVIDER_SITE_OTHER): Payer: Medicare Other | Admitting: Primary Care

## 2018-07-10 ENCOUNTER — Other Ambulatory Visit: Payer: Self-pay

## 2018-07-10 ENCOUNTER — Encounter: Payer: Self-pay | Admitting: Primary Care

## 2018-07-10 VITALS — BP 130/78 | HR 91 | Temp 98.3°F | Ht 60.0 in | Wt 219.2 lb

## 2018-07-10 DIAGNOSIS — G4733 Obstructive sleep apnea (adult) (pediatric): Secondary | ICD-10-CM

## 2018-07-10 DIAGNOSIS — J398 Other specified diseases of upper respiratory tract: Secondary | ICD-10-CM

## 2018-07-10 DIAGNOSIS — R0602 Shortness of breath: Secondary | ICD-10-CM | POA: Diagnosis not present

## 2018-07-10 DIAGNOSIS — I4891 Unspecified atrial fibrillation: Secondary | ICD-10-CM | POA: Diagnosis not present

## 2018-07-10 DIAGNOSIS — J984 Other disorders of lung: Secondary | ICD-10-CM | POA: Diagnosis not present

## 2018-07-10 DIAGNOSIS — I5189 Other ill-defined heart diseases: Secondary | ICD-10-CM

## 2018-07-10 DIAGNOSIS — Z7901 Long term (current) use of anticoagulants: Secondary | ICD-10-CM | POA: Diagnosis not present

## 2018-07-10 LAB — CBC
HCT: 35.9 % — ABNORMAL LOW (ref 36.0–46.0)
Hemoglobin: 12 g/dL (ref 12.0–15.0)
MCHC: 33.4 g/dL (ref 30.0–36.0)
MCV: 97.1 fl (ref 78.0–100.0)
Platelets: 228 10*3/uL (ref 150.0–400.0)
RBC: 3.7 Mil/uL — ABNORMAL LOW (ref 3.87–5.11)
RDW: 17.2 % — ABNORMAL HIGH (ref 11.5–15.5)
WBC: 6.9 10*3/uL (ref 4.0–10.5)

## 2018-07-10 LAB — BRAIN NATRIURETIC PEPTIDE: Pro B Natriuretic peptide (BNP): 340 pg/mL — ABNORMAL HIGH (ref 0.0–100.0)

## 2018-07-10 MED ORDER — TIOTROPIUM BROMIDE MONOHYDRATE 18 MCG IN CAPS: 18.0000 ug | ORAL_CAPSULE | Freq: Every day | RESPIRATORY_TRACT | 3 refills | Status: DC

## 2018-07-10 NOTE — Progress Notes (Signed)
'@Patient'  ID: Lynn Dunn, female    DOB: 28-Mar-1930, 85 y.o.   MRN: [id number]  Chief Complaint  Patient presents with  . Follow-up    SOB with exertion, occasional chest tightness and wheeze    Referring provider: [place], [place] [facility]*  HPI: 85 year old female, never smoked. PMH significant for afib, CAD, HTN, sick sinus syndrome, CVA, COPD, OSA on CPAP, tracheomalacia. Patient of Dr. Lamonte Sakai, last seen on 08/28/17. Felt that risks outweigh benefits from stenting airway. Trial Spiriva respimat.   Previous [place] encounters: ROV 01/18/17 --this is a follow-up visit for 85 year old woman with a history of obstructive sleep apnea (on CPAP), sick sinus syndrome and CVA.  I met her to evaluate long-standing dyspnea.  A CT scan of her chest showed some involution of the posterior wall of her trachea that was concerning for possible bronchotracheal malacia.  This prompted a bronchoscopy that was done on 10/12 and which revealed some laryngeal edema and significant partially obstructing proximal tracheomalacia that extended down into the bilateral mainstem airways.  Distal to those airways the lobar and segmental bronchi were widely patent.  There were no endobronchial lesions or abnormal secretions.  Pulmonary function testing was done on 01/18/17 that I have personally reviewed.  This shows evidence for mixed restriction and obstruction without a significant bronchodilator response.  Her lung volumes are normal and her diffusion capacity is decreased but corrects to the normal range when adjusted for alveolar volume.  ROV 08/28/17 --patient is an 85 year old woman who follows up today for her history of obstructive lung disease.  Her evaluation including bronchoscopy has revealed significant bronchotracheal malacia.  Her distal airways are widely patent.  She has never really responded to bronchodilators which suggest that her obstruction is mainly large airway in nature.  She has a history of  obstructive sleep apnea and uses CPAP. She saw Dr Kerin Ransom at [facility] to discuss possible stenting, ultimately decided that she would not pursue.   07/10/2018 Patient presents today for 1 year follow-up. Complains of worsening shortness of breath. Reports some improvement with Spiriva. Occasional dry cough, rare wheezing. Reports leg swelling. Currently taking lasix 5m daily; states that she was previously taking 80-1278mdaily but tapered down d/t dehydration symptoms. States that she does not feel like it's related to her tracheomalacia. Long standing history of dyspnea. Gained over 50lb the last few yearss. Current weight is 219 lbs. CXR showed cardiomegaly, she had an apt with cardiology but it was rescheduled to August d/t COVID restrictions.   PFTs showed evidence for mixed restriction and obstruction without a significant bronchodilator response.  Her lung volumes are normal and her diffusion capacity is decreased but corrects to the normal range when adjusted for alveolar volume.  [place] - 89/90 days; 97% >4 hours - Pressure 15 cm H20 - AHI 4.0    [place]  Allergies  Allergen Reactions  . Ace Inhibitors Cough  . Albuterol Palpitations and Other (See Comments)    Atrial fibrillation   . Albuterol Sulfate Palpitations and Other (See Comments)    Causes A-Fib  . Oysters [Shellfish Allergy] Nausea And Vomiting  . Sulfa Antibiotics Rash    Immunization History  Administered Date(s) Administered  . Influenza Whole 12/07/2008  . Influenza, High Dose Seasonal PF 11/29/2016  . Influenza-Unspecified 12/02/2014  . Pneumococcal Polysaccharide-23 01/06/2008    Past Medical History:  Diagnosis Date  . Airway malacia   . Anemia   . Anginal pain ([facility]   had  to take nitro   . Arthritis   . COPD (chronic obstructive pulmonary disease) ([place])   . Dysuria   . GERD (gastroesophageal reflux disease)   . Hematuria   . HTN (hypertension)   . Hx of echocardiogram    Echo (12/15):   Mild LVH, EF 60-65%, mild MR, mod LAE (LA 49 mm), PASP 43 mmHg  . Obesity   . OSA (obstructive sleep apnea)    cpap at home  . PAF (paroxysmal atrial fibrillation) ([place])    s/p ablation 07/29/09  . Sick sinus syndrome ([facility])    pauses with syncope  . Stroke ([place]) 6/09   NO RESIDUAL PROBLEMS    Tobacco History: Social History   Tobacco Use  Smoking Status Never Smoker  Smokeless Tobacco Never Used   Counseling given: Not Answered   Outpatient Medications Prior to Visit  Medication Sig Dispense Refill  . amiodarone (PACERONE) 200 MG tablet Take 0.5 tablets (100 mg total) by mouth daily. 30 tablet 0  . calcitRIOL (ROCALTROL) 0.25 MCG capsule Take 0.25 mcg by mouth at bedtime.     . Coenzyme Q10 (COQ10 PO) Take 1 capsule by mouth at bedtime.     Marland Kitchen estradiol (ESTRACE VAGINAL) 0.1 MG/GM vaginal cream Apply pea-sized amount to vagina / urethra 2-3 x weekly to prevent labial adhesions (Patient taking differently: Place vaginally See admin instructions. Apply a pea-sized amount to vagina/urethra 2-3 times a week to prevent labial adhesions) 42.5 g 12  . furosemide (LASIX) 40 MG tablet Take 80 mg by mouth daily.     . Iron-FA-B Cmp-C-Biot-Probiotic (FUSION PLUS) CAPS Take 1 capsule by mouth every 7 (seven) days.     Marland Kitchen KRILL OIL OMEGA-3 PO Take 1 capsule by mouth daily with breakfast.     . levothyroxine (SYNTHROID, LEVOTHROID) 50 MCG tablet Take 50 mcg by mouth daily before breakfast.     . Magnesium 100 MG CAPS Take 100 mg by mouth 2 (two) times daily.     . metoprolol tartrate (LOPRESSOR) 25 MG tablet Take 50 mg by mouth 2 (two) times daily.     . potassium chloride SA (K-DUR,KLOR-CON) 20 MEQ tablet Take 20 mEq by mouth daily.     . rosuvastatin (CRESTOR) 10 MG tablet Take 10 mg by mouth at bedtime.     . Turmeric 500 MG CAPS Take 500 mg by mouth 2 (two) times daily.    Marland Kitchen warfarin (COUMADIN) 3 MG tablet Take 1.5-3 mg by mouth See admin instructions. Take 1.5 mg by mouth in the evening on  Sun/Wed and 3 mg on Mon/Tues/Thurs/Fri/Sat    . predniSONE (DELTASONE) 10 MG tablet Take 4 tabs for 3 days starting 06/14/18, then take 3 tabs for 3 days then take 2 tabs for 3 days then take 1 tab for 3 days. 30 tablet 0  . tiotropium (SPIRIVA) 18 MCG inhalation capsule Place 1 capsule (18 mcg total) into inhaler and inhale daily. 30 capsule 6  . azithromycin (ZITHROMAX) 500 MG tablet Take 1 tablet (500 mg total) by mouth at bedtime. (Patient not taking: Reported on 07/10/2018) 4 tablet 0   No facility-administered medications prior to visit.     Review of Systems  Review of Systems  Constitutional: Positive for fatigue.  HENT: Negative.   Respiratory: Positive for cough, shortness of breath and wheezing.   Cardiovascular: Positive for leg swelling. Negative for chest pain.    Physical Exam  BP 130/78 (BP Location: Left Arm, Cuff Size: Normal)  Pulse 91   Temp 98.3 F (36.8 C)   Ht 5' (1.524 m)   Wt 219 lb 3.2 oz (99.4 kg)   LMP  (LMP Unknown)   SpO2 91%   BMI 42.81 kg/m  Physical Exam Constitutional:      General: She is not in acute distress.    Appearance: Normal appearance. She is obese. She is not ill-appearing.  HENT:     Right Ear: Tympanic membrane normal.     Left Ear: Tympanic membrane normal.     Mouth/Throat:     Mouth: Mucous membranes are moist.     Pharynx: Oropharynx is clear.  Neck:     Musculoskeletal: Normal range of motion and neck supple.  Cardiovascular:     Rate and Rhythm: Normal rate and regular rhythm.  Pulmonary:     Breath sounds: No stridor. No wheezing.     Comments: CTA, no wheezing or stridor Musculoskeletal: Normal range of motion.  Neurological:     General: No focal deficit present.     Mental Status: She is alert. Mental status is at baseline.  Psychiatric:        Mood and Affect: Mood normal.        Behavior: Behavior normal.        Thought Content: Thought content normal.        Judgment: Judgment normal.      Lab Results:   CBC    Component Value Date/Time   WBC 6.9 07/10/2018 1528   RBC 3.70 (L) 07/10/2018 1528   HGB 12.0 07/10/2018 1528   HCT 35.9 (L) 07/10/2018 1528   PLT 228.0 07/10/2018 1528   MCV 97.1 07/10/2018 1528   MCH 31.6 06/13/2018 0236   MCHC 33.4 07/10/2018 1528   RDW 17.2 (H) 07/10/2018 1528   LYMPHSABS 2.2 02/15/2015 1047   MONOABS 0.6 02/15/2015 1047   EOSABS 0.4 02/15/2015 1047   BASOSABS 0.1 02/15/2015 1047    BMET    Component Value Date/Time   NA 140 06/13/2018 0236   NA 141 09/01/2016 0935   K 3.5 06/13/2018 0236   CL 100 06/13/2018 0236   CO2 29 06/13/2018 0236   GLUCOSE 173 (H) 06/13/2018 0236   BUN 28 (H) 06/13/2018 0236   BUN 35 (H) 09/01/2016 0935   CREATININE 1.71 (H) 06/13/2018 0236   CREATININE 1.08 (H) 11/25/2015 1125   CALCIUM 9.1 06/13/2018 0236   GFRNONAA 27 (L) 06/13/2018 0236   GFRAA 31 (L) 06/13/2018 0236    BNP    Component Value Date/Time   BNP 99.5 11/25/2015 1125    ProBNP    Component Value Date/Time   PROBNP 340.0 (H) 07/10/2018 1528    Imaging: Dg Chest Port 1 View  Result Date: 06/12/2018 CLINICAL DATA:  Chest pain EXAM: PORTABLE CHEST 1 VIEW COMPARISON:  09/30/2016 FINDINGS: Left pacer remains in place, unchanged. Cardiomegaly. No confluent airspace opacities or effusions. No acute bony abnormality. IMPRESSION: Cardiomegaly.  No active disease. Electronically Signed   By: Rolm Baptise M.D.   On: 06/12/2018 14:09     Assessment & Plan:   SHORTNESS OF BREATH (SOB) - Long standing hx of dyspnea, worse with exertion but can also occur at rest. Unclear if tracheal malacia is contributing to perceived shortness of breath - CXR in April showed cardiomegaly, no active disease - ProBNP 349  Tracheomalacia - Dr. Lamonte Sakai and patient decided that risk outweigh benefits from stenting  - Continue CPAP at night  COPD (chronic obstructive  pulmonary disease) ([place]) - Never smoked. PFTs showed evidence for mixed restriction and obstruction  without BD response. Lung volumes normal.  - Obstruction felt to be due to large airway in nature and stenting is not recommended  - Continue Spiriva respimat for symptomatic management   OSA (obstructive sleep apnea) - 100% compliant with CPAP and reports benefit from use - Pressure 15cm H20 - AHI 4.0  - No changes  Diastolic dysfunction - Echocardiogram in 2018 showed grade 2 DD, PA normal size and systolic pressure within normal range - Reports worsening sob with assocaited leg swelling - Pro- BNP >300 - Instructed patient to take furosemide 52m daily; alternating 1230mon MWF - Recheck BNP in 2 weeks - Televisit in June with cardiology   Restrictive lung disease - Discussed and encourage benefits from weight loss and staying active   Follow up in 6 months with Dr. ByGigi GinNP 07/11/2018

## 2018-07-10 NOTE — Patient Instructions (Addendum)
Continue Spiriva inhaler daily  Continue to use CPAP every night, great work using this!   Continue lasix 80mg  daily- until labs back and unless told otherwise   Checking labs today (cbc and bnp)   Will try to get you an earlier cardiology appointment   Continue with work on weight loss and staying active   Follow up with Dr. Brock Ra in 6 months or sooner if needed

## 2018-07-11 ENCOUNTER — Encounter: Payer: Self-pay | Admitting: Primary Care

## 2018-07-11 DIAGNOSIS — I5189 Other ill-defined heart diseases: Secondary | ICD-10-CM | POA: Insufficient documentation

## 2018-07-11 NOTE — Assessment & Plan Note (Signed)
-   Never smoked. PFTs showed evidence for mixed restriction and obstruction without BD response. Lung volumes normal.  - Obstruction felt to be due to large airway in nature and stenting is not recommended  - Continue Spiriva respimat for symptomatic management

## 2018-07-11 NOTE — Assessment & Plan Note (Signed)
-   100% compliant with CPAP and reports benefit from use - Pressure 15cm H20 - AHI 4.0  - No changes

## 2018-07-11 NOTE — Assessment & Plan Note (Signed)
-   Long standing hx of dyspnea, worse with exertion but can also occur at rest. Unclear if tracheal malacia is contributing to perceived shortness of breath - CXR in April showed cardiomegaly, no active disease - ProBNP 349

## 2018-07-11 NOTE — Assessment & Plan Note (Signed)
-   Discussed and encourage benefits from weight loss and staying active

## 2018-07-11 NOTE — Assessment & Plan Note (Addendum)
-   Dr. Lamonte Sakai and patient decided that risk outweigh benefits from stenting  - Continue CPAP at night

## 2018-07-11 NOTE — Assessment & Plan Note (Signed)
-   Echocardiogram in 2018 showed grade 2 DD, PA normal size and systolic pressure within normal range - Reports worsening sob with assocaited leg swelling - Pro- BNP >300 - Instructed patient to take furosemide 80mg  daily; alternating 120mg  on MWF - Recheck BNP in 2 weeks - Televisit in June with cardiology

## 2018-07-12 ENCOUNTER — Telehealth: Payer: Self-pay

## 2018-07-12 NOTE — Telephone Encounter (Signed)
Spoke with pt regarding appt on 07/15/18. Pt stated she does not have access to a smart device. Pt was advise to keep televisit and check vitals prior to appt. Pt questions were address.

## 2018-07-15 ENCOUNTER — Telehealth (INDEPENDENT_AMBULATORY_CARE_PROVIDER_SITE_OTHER): Payer: Medicare Other | Admitting: Internal Medicine

## 2018-07-15 ENCOUNTER — Other Ambulatory Visit: Payer: Self-pay

## 2018-07-15 DIAGNOSIS — R0602 Shortness of breath: Secondary | ICD-10-CM | POA: Diagnosis not present

## 2018-07-15 DIAGNOSIS — I779 Disorder of arteries and arterioles, unspecified: Secondary | ICD-10-CM

## 2018-07-15 DIAGNOSIS — I739 Peripheral vascular disease, unspecified: Secondary | ICD-10-CM

## 2018-07-15 DIAGNOSIS — I480 Paroxysmal atrial fibrillation: Secondary | ICD-10-CM

## 2018-07-15 DIAGNOSIS — I10 Essential (primary) hypertension: Secondary | ICD-10-CM

## 2018-07-15 DIAGNOSIS — J398 Other specified diseases of upper respiratory tract: Secondary | ICD-10-CM | POA: Diagnosis not present

## 2018-07-15 NOTE — Progress Notes (Signed)
Electrophysiology TeleHealth Note   Due to national recommendations of social distancing due to [place] 19, an audio telehealth visit is felt to be most appropriate for this patient at this time. Verbal consent was obtained from the patient today.  She does not have technology that will allow for a virtual visit.   Date:  07/15/2018   ID:  Lynn Dunn, DOB Jul 05, 1930, MRN [id number]  Location: patient's home  Provider location: [address], [place]  Evaluation Performed: Follow-up visit  PCP:  [place], [place]  Cardiologist:  No primary care provider on file.  Electrophysiologist:  Dr Rayann Heman  Chief Complaint:  Shortness of breath  History of Present Illness:    Lynn Dunn is a 85 y.o. female who presents via audio/video conferencing for a telehealth visit today. She continues to struggle with SOB.   This occurs with minimal exertion.  She is followed by pulmonary team.  She was seen at [facility] for [place].  Today, she denies symptoms of palpitations, chest pain, lower extremity edema, dizziness, presyncope, or syncope.  The patient is otherwise without complaint today.  The patient denies symptoms of fevers, chills, cough, or new SOB worrisome for COVID 19.  Past Medical History:  Diagnosis Date   Airway malacia    Anemia    Anginal pain ([place]) 2011   had to take nitro    Arthritis    COPD (chronic obstructive pulmonary disease) (HCC)    Dysuria    GERD (gastroesophageal reflux disease)    Hematuria    HTN (hypertension)    Hx of echocardiogram    Echo (12/15):  Mild LVH, EF 60-65%, mild MR, mod LAE (LA 49 mm), PASP 43 mmHg   Obesity    OSA (obstructive sleep apnea)    cpap at home   PAF (paroxysmal atrial fibrillation) ([place])    s/p ablation 07/29/09   Sick sinus syndrome (HCC)    pauses with syncope   Stroke ([place]) 6/09   NO RESIDUAL PROBLEMS    Past Surgical History:  Procedure Laterality Date   ABDOMINAL  HYSTERECTOMY     ATRIAL ABLATION SURGERY     s/p ablation for afib 07/29/09   CYSTOSCOPY N/A 06/23/2015   Procedure: CYSTOSCOPY;  Surgeon: Alexis Frock, MD;  Location: [facility];  Service: Urology;  Laterality: N/A;   EP IMPLANTABLE DEVICE N/A 02/17/2015   MDT Advisa MRI PPM implanted by Dr Lovena Le for sick sinus and syncope   EP IMPLANTABLE DEVICE N/A 02/17/2015   Procedure: Loop Recorder Removal;  Surgeon: Evans Lance, MD;  Location: [facility] CV LAB;  Service: Cardiovascular;  Laterality: N/A;   ESOPHAGOGASTRODUODENOSCOPY  01/24/2012   Procedure: ESOPHAGOGASTRODUODENOSCOPY (EGD);  Surgeon: Jeryl Columbia, MD;  Location: [facility] ENDOSCOPY;  Service: Endoscopy;  Laterality: N/A;   KNEE ARTHROSCOPY     BILATERAL   LOOP RECORDER IMPLANT N/A 03/12/2014   Procedure: LOOP RECORDER IMPLANT;  Surgeon: Thompson Grayer, MD;  Location: [facility] CATH LAB;  Service: Cardiovascular;  Laterality: N/A;   LYSIS OF ADHESION N/A 06/23/2015   Procedure: LYSIS OF LABIAL ADHESION;  Surgeon: Alexis Frock, MD;  [facility];  Service: Urology;  Laterality: N/A;   VIDEO BRONCHOSCOPY Bilateral 12/15/2016   Procedure: VIDEO BRONCHOSCOPY WITHOUT FLUORO;  Surgeon: Collene Gobble, MD;  [facility] ENDOSCOPY;  Service: Cardiopulmonary;  Laterality: Bilateral;    Current Outpatient Medications  Medication Sig Dispense Refill   amiodarone (PACERONE) 200 MG tablet Take  0.5 tablets (100 mg total) by mouth daily. 30 tablet 0   calcitRIOL (ROCALTROL) 0.25 MCG capsule Take 0.25 mcg by mouth at bedtime.      Coenzyme Q10 (COQ10 PO) Take 1 capsule by mouth at bedtime.      estradiol (ESTRACE VAGINAL) 0.1 MG/GM vaginal cream Apply pea-sized amount to vagina / urethra 2-3 x weekly to prevent labial adhesions (Patient taking differently: Place vaginally See admin instructions. Apply a pea-sized amount to vagina/urethra 2-3 times a week to prevent labial adhesions) 42.5 g 12   furosemide (LASIX) 40 MG tablet Take 80 mg by  mouth daily.      Iron-FA-B Cmp-C-Biot-Probiotic (FUSION PLUS) CAPS Take 1 capsule by mouth every 7 (seven) days.      KRILL OIL OMEGA-3 PO Take 1 capsule by mouth daily with breakfast.      levothyroxine (SYNTHROID, LEVOTHROID) 50 MCG tablet Take 50 mcg by mouth daily before breakfast.      Magnesium 100 MG CAPS Take 100 mg by mouth 2 (two) times daily.      metoprolol tartrate (LOPRESSOR) 25 MG tablet Take 50 mg by mouth 2 (two) times daily.      potassium chloride SA (K-DUR,KLOR-CON) 20 MEQ tablet Take 20 mEq by mouth daily.      rosuvastatin (CRESTOR) 10 MG tablet Take 10 mg by mouth at bedtime.      tiotropium (SPIRIVA) 18 MCG inhalation capsule Place 1 capsule (18 mcg total) into inhaler and inhale daily. 90 capsule 3   Turmeric 500 MG CAPS Take 500 mg by mouth 2 (two) times daily.     warfarin (COUMADIN) 3 MG tablet Take 1.5-3 mg by mouth See admin instructions. Take 1.5 mg by mouth in the evening on Sun/Wed and 3 mg on Mon/Tues/Thurs/Fri/Sat     No current facility-administered medications for this visit.     Allergies:   Ace inhibitors; Albuterol; Albuterol sulfate; Oysters [shellfish allergy]; and Sulfa antibiotics   Social History:  The patient  reports that she has never smoked. She has never used smokeless tobacco. She reports that she does not drink alcohol or use drugs.   Family History:  The patient's  family history includes Brain cancer in her father and another family member; Breast cancer in her mother and another family member; Hypertension in her sister; Stroke in her maternal aunt.   ROS:  Please see the history of present illness.   All other systems are personally reviewed and negative.    Exam:    Vital Signs:  LMP  (LMP Unknown)   Well sounding   Labs/Other Tests and Data Reviewed:    Recent Labs: 06/13/2018: BUN 28; Creatinine, Ser 1.71; Potassium 3.5; Sodium 140 07/10/2018: Hemoglobin 12.0; Platelets 228.0; Pro B Natriuretic peptide (BNP) 340.0    Wt Readings from Last 3 Encounters:  07/10/18 219 lb 3.2 oz (99.4 kg)  08/28/17 211 lb (95.7 kg)  05/23/17 207 lb (93.9 kg)     Other studies personally reviewed: Additional studies/ records that were reviewed today include: my prior notes, pulmonary clinic notes Review of the above records today demonstrates: as above Prior radiographs: 09/24/16- no PV stensis by cardiac CT    Last device remote is reviewed from [facility] PDF dated 04/04/18 which reveals normal device function, no arrhythmias    ASSESSMENT & PLAN:    1.  Sick sinus syndrome Normal pacemaker function See Pace Art report No changes today she is not device dependant today  2. afib Well controlled  On coumadin for chads2vasc score of 6 Stop amiodarone (see below)  3. SOB multifactoral Primarily due to tracheomalacia Reviewed cardiac CT (no cause detected) CPX also reviewed I did offer stopping amiodarone at this time. She is now willing to stop it.   4. HTN Stable No change required today  5. Obesity Lifestyle modification is encouraged  6. COVID 19 screen The patient denies symptoms of COVID 19 at this time.  The importance of social distancing was discussed today.  Follow-up:  AF clinic in 4 months Next remote: due  Current medicines are reviewed at length with the patient today.   The patient does not have concerns regarding her medicines.  The following changes were made today:  none  Labs/ tests ordered today include:  No orders of the defined types were placed in this encounter.   Patient Risk:  after full review of this patients clinical status, I feel that they are at moderate risk at this time.  Today, I have spent 22 minutes with the patient with telehealth technology discussing afib and SOB .    Army Fossa, MD  07/15/2018 10:55 AM     [facility] [address] [place] [address] [phone] (office) [phone] (fax)

## 2018-07-16 ENCOUNTER — Ambulatory Visit (INDEPENDENT_AMBULATORY_CARE_PROVIDER_SITE_OTHER): Payer: Medicare Other | Admitting: *Deleted

## 2018-07-16 ENCOUNTER — Other Ambulatory Visit: Payer: Self-pay

## 2018-07-16 DIAGNOSIS — I495 Sick sinus syndrome: Secondary | ICD-10-CM

## 2018-07-16 DIAGNOSIS — R0602 Shortness of breath: Secondary | ICD-10-CM

## 2018-07-16 DIAGNOSIS — I480 Paroxysmal atrial fibrillation: Secondary | ICD-10-CM

## 2018-07-17 DIAGNOSIS — Z7901 Long term (current) use of anticoagulants: Secondary | ICD-10-CM | POA: Diagnosis not present

## 2018-07-17 DIAGNOSIS — I4891 Unspecified atrial fibrillation: Secondary | ICD-10-CM | POA: Diagnosis not present

## 2018-07-17 LAB — CUP PACEART REMOTE DEVICE CHECK
Battery Remaining Longevity: 88 mo
Battery Voltage: 3.01 V
Brady Statistic AP VP Percent: 0.07 %
Brady Statistic AP VS Percent: 85.93 %
Brady Statistic AS VP Percent: 0.02 %
Brady Statistic AS VS Percent: 13.98 %
Brady Statistic RA Percent Paced: 85.99 %
Brady Statistic RV Percent Paced: 0.09 %
Date Time Interrogation Session: 20200511155449
Implantable Lead Implant Date: 20161214
Implantable Lead Implant Date: 20161214
Implantable Lead Location: 753859
Implantable Lead Location: 753860
Implantable Lead Model: 5076
Implantable Lead Model: 5076
Implantable Pulse Generator Implant Date: 20161214
Lead Channel Impedance Value: 399 Ohm
Lead Channel Impedance Value: 418 Ohm
Lead Channel Impedance Value: 437 Ohm
Lead Channel Impedance Value: 513 Ohm
Lead Channel Pacing Threshold Amplitude: 1.125 V
Lead Channel Pacing Threshold Amplitude: 1.375 V
Lead Channel Pacing Threshold Pulse Width: 0.4 ms
Lead Channel Pacing Threshold Pulse Width: 0.4 ms
Lead Channel Sensing Intrinsic Amplitude: 10 mV
Lead Channel Sensing Intrinsic Amplitude: 10 mV
Lead Channel Sensing Intrinsic Amplitude: 3.125 mV
Lead Channel Sensing Intrinsic Amplitude: 3.125 mV
Lead Channel Setting Pacing Amplitude: 2.25 V
Lead Channel Setting Pacing Amplitude: 2.75 V
Lead Channel Setting Pacing Pulse Width: 0.4 ms
Lead Channel Setting Sensing Sensitivity: 2.8 mV

## 2018-07-30 ENCOUNTER — Ambulatory Visit: Payer: Medicare Other | Admitting: Primary Care

## 2018-07-31 DIAGNOSIS — I4891 Unspecified atrial fibrillation: Secondary | ICD-10-CM | POA: Diagnosis not present

## 2018-07-31 DIAGNOSIS — Z7901 Long term (current) use of anticoagulants: Secondary | ICD-10-CM | POA: Diagnosis not present

## 2018-07-31 NOTE — Progress Notes (Signed)
Remote pacemaker transmission.   

## 2018-08-13 DIAGNOSIS — Z950 Presence of cardiac pacemaker: Secondary | ICD-10-CM | POA: Diagnosis not present

## 2018-08-13 DIAGNOSIS — N2581 Secondary hyperparathyroidism of renal origin: Secondary | ICD-10-CM | POA: Diagnosis not present

## 2018-08-13 DIAGNOSIS — I129 Hypertensive chronic kidney disease with stage 1 through stage 4 chronic kidney disease, or unspecified chronic kidney disease: Secondary | ICD-10-CM | POA: Diagnosis not present

## 2018-08-13 DIAGNOSIS — R319 Hematuria, unspecified: Secondary | ICD-10-CM | POA: Diagnosis not present

## 2018-08-13 DIAGNOSIS — E1122 Type 2 diabetes mellitus with diabetic chronic kidney disease: Secondary | ICD-10-CM | POA: Diagnosis not present

## 2018-08-13 DIAGNOSIS — E669 Obesity, unspecified: Secondary | ICD-10-CM | POA: Diagnosis not present

## 2018-08-13 DIAGNOSIS — I4891 Unspecified atrial fibrillation: Secondary | ICD-10-CM | POA: Diagnosis not present

## 2018-08-13 DIAGNOSIS — D631 Anemia in chronic kidney disease: Secondary | ICD-10-CM | POA: Diagnosis not present

## 2018-08-13 DIAGNOSIS — N189 Chronic kidney disease, unspecified: Secondary | ICD-10-CM | POA: Diagnosis not present

## 2018-08-13 DIAGNOSIS — I495 Sick sinus syndrome: Secondary | ICD-10-CM | POA: Diagnosis not present

## 2018-08-13 DIAGNOSIS — Z8673 Personal history of transient ischemic attack (TIA), and cerebral infarction without residual deficits: Secondary | ICD-10-CM | POA: Diagnosis not present

## 2018-08-13 DIAGNOSIS — Z7901 Long term (current) use of anticoagulants: Secondary | ICD-10-CM | POA: Diagnosis not present

## 2018-08-13 DIAGNOSIS — N183 Chronic kidney disease, stage 3 (moderate): Secondary | ICD-10-CM | POA: Diagnosis not present

## 2018-08-14 DIAGNOSIS — Z7901 Long term (current) use of anticoagulants: Secondary | ICD-10-CM | POA: Diagnosis not present

## 2018-08-14 DIAGNOSIS — I4891 Unspecified atrial fibrillation: Secondary | ICD-10-CM | POA: Diagnosis not present

## 2018-08-20 ENCOUNTER — Telehealth: Payer: Self-pay

## 2018-08-20 NOTE — Telephone Encounter (Signed)
Returned call to [place], [employer].  Medical release form was received and is available in scan.  Nothing further at this time.

## 2018-08-30 IMAGING — CT CT HEART MORP W/ CTA COR W/ SCORE W/ CA W/CM &/OR W/O CM
1 series · 3 of 5 positions shown, 4 images · non-contrast
Comparison: Chest CT 09/01/2009.

CLINICAL DATA: 85 -year-old female with shortness of breath.
Evaluate for CAD and pulmonary stenosis.

EXAM:
Cardiac/Coronary CTA, Pulmonary veins evaluation
TECHNIQUE: The patient was scanned on a Philips 256 scanner.

[Series 693: (person_name) - pv · portal-venous · 0.33mm/px · 3 of 5 slices shown, 4 images]
[im 2/5  vessel]
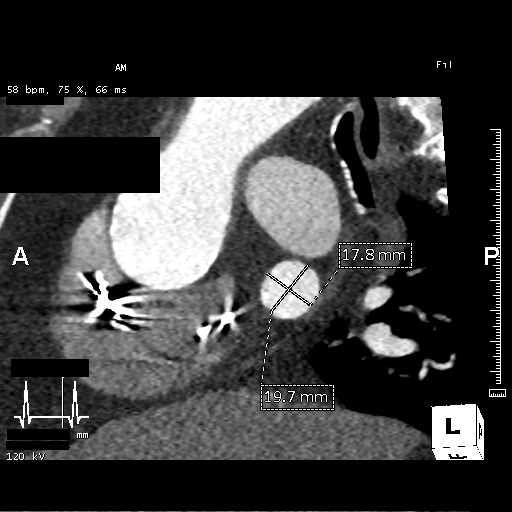
[im 2/5  lung]
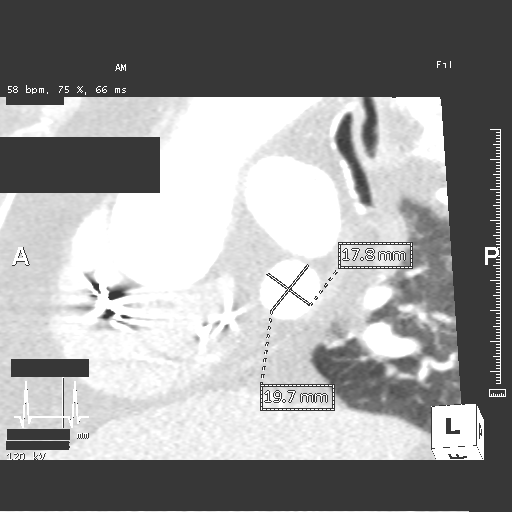
[im 3/5  vessel]
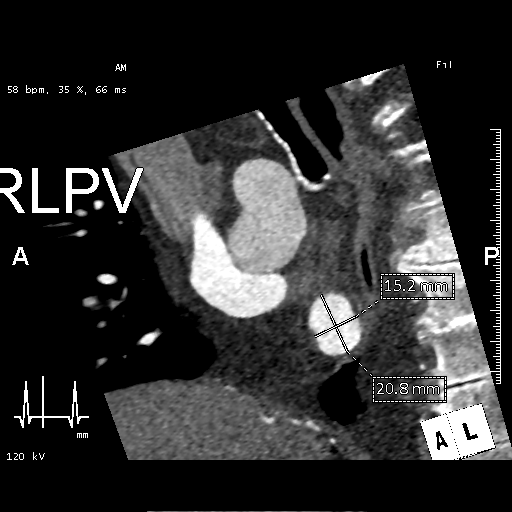
[im 4/5  vessel]
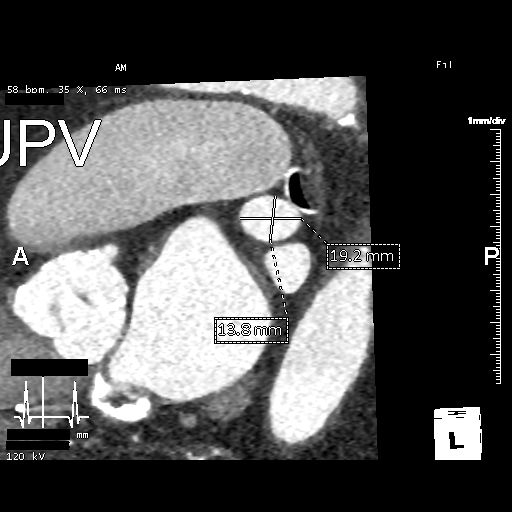

[3 of 5 positions shown; findings below may reference images not displayed]

FINDINGS: A 120 kV prospective scan was triggered in the descending thoracic
aorta at 111 HU's. Axial non-contrast 3 mm slices were carried out
through the heart. The data set was analyzed on a dedicated work
station and scored using the Agatson method. 5 mg of iv Metoprolol
and 0.8 mg of sl NTG was given. The 3D data set was reconstructed in
5% intervals of the 67-82 % of the R-R cycle. Diastolic phases were
analyzed on a dedicated work station using MPR, MIP and VRT modes.
The patient received 80 cc of contrast.

Aorta:  Normal size.  Mild diffuse calcifications.  No dissection.

Aortic Valve:  Trileaflet.  Mild diffuse calcifications.

Coronary Arteries:  Normal coronary origin.  Right dominance.

RCA is a large dominant artery that gives rise to PDA and PLVB.
There severe circumferential plaque in the ostial RCA associated
with focal 50-69% stenosis but possibly > 70% stenosis.

Proximal/mid RCA have mild diffuse calcifications with no
significant stenosis. PDA is small and difficult to evaluate, there
is no obvious plaque.

Left main is a large artery that gives rise to LAD and LCX arteries.

LAD is a medium size vessel that gives rise to one diagonal branch.
There is a mild mixed plaque at the D1 takeoff associated with
25-50% stenosis. Caliber of the distal LAD is very small.

D1 has no obvious stenosis.

LCX is a medium size non-dominant artery that gives rise to one OM1
branch. There is mild calcified plaque in the proximal LCX artery
associated with 25-50% stenosis.

OM1 has no obvious stenosis.

Other findings:

Normal pulmonary vein drainage into the left atrium.

Normal let atrial appendage without a thrombus.

Mildly dilated pulmonary artery measuring 32 x 31 mm suspicious for
pulmonary hypertension.

There is normal pulmonary vein drainage into the left atrium (2 on
the right and 2 on the left) with ostial measurements as follows:

RUPV:  20 x 18 mm

RLPV:  21 x 15 mm

LUPV:  19 x 14 mm

LLPV:  15 x 14 mm
IMPRESSION: 1. There is normal pulmonary vein drainage into the left atrium with
no evidence for pulmonary vein stenosis.

2. Mildly dilated pulmonary artery measuring 31 x 27 mm suspicious
for pulmonary hypertension

3. Coronary calcium score of 7177. This was 89 percentile for age
and sex matched control.

4. Normal coronary origin with right dominance.

5. Moderate calcified plaque in the ostial RCA with focal 50-69%
stenosis but possibly > 70% stenosis. Mild CAD in LAD and LCX
arteries. Further evaluation with CT FFR is recommended.

6. Normal let atrial appendage without a thrombus.

Asloma Gordab

EXAM:
OVER-READ INTERPRETATION  CT CHEST

The following report is an over-read performed by radiologist Dr.
over-read does not include interpretation of cardiac or coronary
anatomy or pathology. The coronary calcium score/coronary CTA
interpretation by the cardiologist is attached.
FINDINGS: Aortic atherosclerosis. Areas of mild linear scarring in the
inferior segment of the lingula and in the right middle lobe.
Visualized portions of the trachea appear partially collapsed, which
could suggest tracheomalacia. Within the visualized portions of the
thorax there are no suspicious appearing pulmonary nodules or
masses, there is no acute consolidative airspace disease, no pleural
effusions, no pneumothorax and no lymphadenopathy. Subcentimeter
low-attenuation lesion in the periphery of segment 4A of the liver
is too small to characterize, but is statistically likely a tiny
cyst. Visualized portions of the upper abdomen are otherwise
unremarkable. There are no aggressive appearing lytic or blastic
lesions noted in the visualized portions of the skeleton.
IMPRESSION: 1. Aortic atherosclerosis.
2. Although incompletely visualized, the visualized portions of the
trachea appear partially collapsed, which could indicate
tracheomalacia.

## 2018-09-04 DIAGNOSIS — I4891 Unspecified atrial fibrillation: Secondary | ICD-10-CM | POA: Diagnosis not present

## 2018-09-04 DIAGNOSIS — Z7901 Long term (current) use of anticoagulants: Secondary | ICD-10-CM | POA: Diagnosis not present

## 2018-09-09 DIAGNOSIS — Z803 Family history of malignant neoplasm of breast: Secondary | ICD-10-CM | POA: Diagnosis not present

## 2018-09-09 DIAGNOSIS — Z1231 Encounter for screening mammogram for malignant neoplasm of breast: Secondary | ICD-10-CM | POA: Diagnosis not present

## 2018-09-17 DIAGNOSIS — Z7901 Long term (current) use of anticoagulants: Secondary | ICD-10-CM | POA: Diagnosis not present

## 2018-09-17 DIAGNOSIS — I4891 Unspecified atrial fibrillation: Secondary | ICD-10-CM | POA: Diagnosis not present

## 2018-09-20 DIAGNOSIS — I129 Hypertensive chronic kidney disease with stage 1 through stage 4 chronic kidney disease, or unspecified chronic kidney disease: Secondary | ICD-10-CM | POA: Diagnosis not present

## 2018-09-20 DIAGNOSIS — N183 Chronic kidney disease, stage 3 (moderate): Secondary | ICD-10-CM | POA: Diagnosis not present

## 2018-09-20 DIAGNOSIS — E1122 Type 2 diabetes mellitus with diabetic chronic kidney disease: Secondary | ICD-10-CM | POA: Diagnosis not present

## 2018-09-20 DIAGNOSIS — R0602 Shortness of breath: Secondary | ICD-10-CM | POA: Diagnosis not present

## 2018-10-02 DIAGNOSIS — M1712 Unilateral primary osteoarthritis, left knee: Secondary | ICD-10-CM | POA: Diagnosis not present

## 2018-10-02 DIAGNOSIS — M1711 Unilateral primary osteoarthritis, right knee: Secondary | ICD-10-CM | POA: Diagnosis not present

## 2018-10-09 DIAGNOSIS — I4891 Unspecified atrial fibrillation: Secondary | ICD-10-CM | POA: Diagnosis not present

## 2018-10-09 DIAGNOSIS — Z7901 Long term (current) use of anticoagulants: Secondary | ICD-10-CM | POA: Diagnosis not present

## 2018-10-15 ENCOUNTER — Ambulatory Visit (INDEPENDENT_AMBULATORY_CARE_PROVIDER_SITE_OTHER): Payer: Medicare Other | Admitting: *Deleted

## 2018-10-15 DIAGNOSIS — M1711 Unilateral primary osteoarthritis, right knee: Secondary | ICD-10-CM | POA: Diagnosis not present

## 2018-10-15 DIAGNOSIS — M1712 Unilateral primary osteoarthritis, left knee: Secondary | ICD-10-CM | POA: Diagnosis not present

## 2018-10-15 DIAGNOSIS — I495 Sick sinus syndrome: Secondary | ICD-10-CM

## 2018-10-15 DIAGNOSIS — I480 Paroxysmal atrial fibrillation: Secondary | ICD-10-CM

## 2018-10-17 ENCOUNTER — Ambulatory Visit: Payer: Medicare Other | Admitting: Cardiology

## 2018-10-17 LAB — CUP PACEART REMOTE DEVICE CHECK
Battery Remaining Longevity: 87 mo
Battery Voltage: 3.01 V
Brady Statistic AP VP Percent: 0.21 %
Brady Statistic AP VS Percent: 86.65 %
Brady Statistic AS VP Percent: 0.03 %
Brady Statistic AS VS Percent: 13.1 %
Brady Statistic RA Percent Paced: 86.85 %
Brady Statistic RV Percent Paced: 0.25 %
Date Time Interrogation Session: 20200813030443
Implantable Lead Implant Date: 20161214
Implantable Lead Implant Date: 20161214
Implantable Lead Location: 753859
Implantable Lead Location: 753860
Implantable Lead Model: 5076
Implantable Lead Model: 5076
Implantable Pulse Generator Implant Date: 20161214
Lead Channel Impedance Value: 399 Ohm
Lead Channel Impedance Value: 399 Ohm
Lead Channel Impedance Value: 437 Ohm
Lead Channel Impedance Value: 513 Ohm
Lead Channel Pacing Threshold Amplitude: 1 V
Lead Channel Pacing Threshold Amplitude: 1.25 V
Lead Channel Pacing Threshold Pulse Width: 0.4 ms
Lead Channel Pacing Threshold Pulse Width: 0.4 ms
Lead Channel Sensing Intrinsic Amplitude: 2.75 mV
Lead Channel Sensing Intrinsic Amplitude: 2.75 mV
Lead Channel Sensing Intrinsic Amplitude: 8.625 mV
Lead Channel Sensing Intrinsic Amplitude: 8.625 mV
Lead Channel Setting Pacing Amplitude: 2.25 V
Lead Channel Setting Pacing Amplitude: 2.75 V
Lead Channel Setting Pacing Pulse Width: 0.4 ms
Lead Channel Setting Sensing Sensitivity: 2.8 mV

## 2018-10-21 ENCOUNTER — Telehealth: Payer: Self-pay

## 2018-10-21 NOTE — Telephone Encounter (Signed)
Left message regarding appt on 10/23/18.

## 2018-10-22 DIAGNOSIS — M1712 Unilateral primary osteoarthritis, left knee: Secondary | ICD-10-CM | POA: Diagnosis not present

## 2018-10-22 DIAGNOSIS — M1711 Unilateral primary osteoarthritis, right knee: Secondary | ICD-10-CM | POA: Diagnosis not present

## 2018-10-23 ENCOUNTER — Telehealth: Payer: Medicare Other | Admitting: Internal Medicine

## 2018-10-23 ENCOUNTER — Other Ambulatory Visit: Payer: Self-pay

## 2018-10-25 NOTE — Progress Notes (Signed)
Remote pacemaker transmission.   

## 2018-10-30 DIAGNOSIS — Z7901 Long term (current) use of anticoagulants: Secondary | ICD-10-CM | POA: Diagnosis not present

## 2018-10-30 DIAGNOSIS — I4891 Unspecified atrial fibrillation: Secondary | ICD-10-CM | POA: Diagnosis not present

## 2018-10-31 NOTE — Progress Notes (Signed)
Cardiology Office Note   Date:  11/01/2018   ID:  Lynn Dunn, Lynn Dunn 03-09-1930, MRN [id number]  PCP:  Javier Glazier, MD    No chief complaint on file.  PAF  Wt Readings from Last 3 Encounters:  11/01/18 209 lb 9.6 oz (95.1 kg)  07/10/18 219 lb 3.2 oz (99.4 kg)  08/28/17 211 lb (95.7 kg)       History of Present Illness: Lynn Dunn is a 85 y.o. female  Who has had AFib and CVA in the past, treated with TPA. She has had atrial fibrillation ablation in the past, and had distinct palpitations with AFib.   She hada loop monitor in place due to syncope/near syncope. She had a pacer placed.  She had knee surgery in 06/12/2014.   Unfortunately, her husband passed away in 2015-06-12 from aortic stenosis.   She has had chronic shortness of breath and was seen in 6/18 by Dr. Rayann Heman. His plan was : "Unclear etiology (of SHOB) I have discussed at length by phone with Dr Irish Lack today Will obtain cardiac CT to evaluate for pulmonary vein stenosis. If CT is unrevealing, would refer to pulmonary for further assessment. As findings are chronic and prior cath did not reveal CAD, Dr Irish Lack does not feel that cath is indicated currently.  Reduce metoprolol to 25mg  BID and reduce amiodarone to 100mg  daily Hopefully we can wean Amiodarone off in the future  Consider CPX testing if SOB cause is not found.  Since the last visit, she states that her [facility] is worse.  Her HR goes up to the high 90s.  Oxygen sats are in the low 90s typically.  She has decreased the Amio without any benefit.    Denies : Chest pain. Dizziness. Leg edema. Nitroglycerin use. Orthopnea. Paroxysmal nocturnal dyspnea. Shortness of breath. Syncope.     Past Medical History:  Diagnosis Date  . Airway malacia   . Anemia   . Anginal pain ([place]) 06-11-09   had to take nitro   . Arthritis   . COPD (chronic obstructive pulmonary disease) ([place])   . Dysuria   . GERD (gastroesophageal reflux disease)   .  Hematuria   . HTN (hypertension)   . Hx of echocardiogram    Echo (12/15):  Mild LVH, EF 60-65%, mild MR, mod LAE (LA 49 mm), PASP 43 mmHg  . Obesity   . OSA (obstructive sleep apnea)    cpap at home  . PAF (paroxysmal atrial fibrillation) ([place])    s/p ablation 07/29/09  . Sick sinus syndrome (HCC)    pauses with syncope  . Stroke ([place]) 6/09   NO RESIDUAL PROBLEMS    Past Surgical History:  Procedure Laterality Date  . ABDOMINAL HYSTERECTOMY    . ATRIAL ABLATION SURGERY     s/p ablation for afib 07/29/09  . CYSTOSCOPY N/A 06/23/2015   Procedure: CYSTOSCOPY;  Surgeon: Alexis Frock, MD;  [facility];  Service: Urology;  Laterality: N/A;  . EP IMPLANTABLE DEVICE N/A 02/17/2015   MDT Advisa MRI PPM implanted by Dr Lovena Le for sick sinus and syncope  . EP IMPLANTABLE DEVICE N/A 02/17/2015   Procedure: Loop Recorder Removal;  Surgeon: Evans Lance, MD;  Location: [facility] CV LAB;  Service: Cardiovascular;  Laterality: N/A;  . ESOPHAGOGASTRODUODENOSCOPY  01/24/2012   Procedure: ESOPHAGOGASTRODUODENOSCOPY (EGD);  Surgeon: Jeryl Columbia, MD;  Location: [facility] ENDOSCOPY;  Service: Endoscopy;  Laterality: N/A;  . KNEE ARTHROSCOPY  BILATERAL  . LOOP RECORDER IMPLANT N/A 03/12/2014   Procedure: LOOP RECORDER IMPLANT;  Surgeon: Thompson Grayer, MD;  Location: [facility] CATH LAB;  Service: Cardiovascular;  Laterality: N/A;  . LYSIS OF ADHESION N/A 06/23/2015   Procedure: LYSIS OF LABIAL ADHESION;  Surgeon: Alexis Frock, MD;  [facility]: [facility];  Service: Urology;  Laterality: N/A;  . VIDEO BRONCHOSCOPY Bilateral 12/15/2016   Procedure: VIDEO BRONCHOSCOPY WITHOUT FLUORO;  Surgeon: Collene Gobble, MD;  [facility] ENDOSCOPY;  Service: Cardiopulmonary;  Laterality: Bilateral;     Current Outpatient Medications  Medication Sig Dispense Refill  . amiodarone (PACERONE) 200 MG tablet Take 0.5 tablets (100 mg total) by mouth daily. (Patient taking differently: Take 50 mg by mouth daily. Take one quarter  tablet by mouth daily.) 30 tablet 0  . calcitRIOL (ROCALTROL) 0.25 MCG capsule Take 0.25 mcg by mouth at bedtime.     . Coenzyme Q10 (COQ10 PO) Take 1 capsule by mouth at bedtime.     Marland Kitchen estradiol (ESTRACE VAGINAL) 0.1 MG/GM vaginal cream Apply pea-sized amount to vagina / urethra 2-3 x weekly to prevent labial adhesions (Patient taking differently: Place vaginally See admin instructions. Apply a pea-sized amount to vagina/urethra 2-3 times a week to prevent labial adhesions) 42.5 g 12  . furosemide (LASIX) 40 MG tablet Take 120 mg by mouth daily. Take 3 tablets by mouth daily.    . Iron-FA-B Cmp-C-Biot-Probiotic (FUSION PLUS) CAPS Take 1 capsule by mouth every 7 (seven) days.     Marland Kitchen levothyroxine (SYNTHROID, LEVOTHROID) 50 MCG tablet Take 50 mcg by mouth daily before breakfast.     . Magnesium 100 MG CAPS Take 100 mg by mouth 2 (two) times daily.     . metoprolol tartrate (LOPRESSOR) 25 MG tablet Take 50 mg by mouth 2 (two) times daily.     . potassium chloride SA (K-DUR,KLOR-CON) 20 MEQ tablet Take 20 mEq by mouth 3 (three) times a week.     . rosuvastatin (CRESTOR) 10 MG tablet Take 10 mg by mouth at bedtime.     Marland Kitchen tiotropium (SPIRIVA) 18 MCG inhalation capsule Place 1 capsule (18 mcg total) into inhaler and inhale daily. 90 capsule 3  . Turmeric 500 MG CAPS Take 500 mg by mouth 2 (two) times daily.    Marland Kitchen warfarin (COUMADIN) 3 MG tablet Take 1.5-3 mg by mouth See admin instructions. Take 1.5 mg by mouth in the evening on Sun/Wed and 3 mg on Mon/Tues/Thurs/Fri/Sat     No current facility-administered medications for this visit.     Allergies:   Ace inhibitors, Albuterol, Albuterol sulfate, Oysters [shellfish allergy], and Sulfa antibiotics    Social History:  The patient  reports that she has never smoked. She has never used smokeless tobacco. She reports that she does not drink alcohol or use drugs.   Family History:  The patient's family history includes Brain cancer in her father and another  family member; Breast cancer in her mother and another family member; Hypertension in her sister; Stroke in her maternal aunt.    ROS:  Please see the history of present illness.   Otherwise, review of systems are positive for DOE.   All other systems are reviewed and negative.    PHYSICAL EXAM: VS:  BP (!) 150/60   Pulse 85   Ht 5' (1.524 m)   Wt 209 lb 9.6 oz (95.1 kg)   LMP  (LMP Unknown)   SpO2 99%   BMI 40.93 kg/m  , BMI Body  mass index is 40.93 kg/m. GEN: Well nourished, well developed, in no acute distress  HEENT: normal  Neck: no JVD, carotid bruits, or masses Cardiac: RRR; 2/6 systolic murmur, norubs, or gallops,no edema  Respiratory:  clear to auscultation bilaterally, normal work of breathing GI: soft, nontender, nondistended, + BS, obese MS: no deformity or atrophy  Skin: warm and dry, no rash Neuro:  Strength and sensation are intact Psych: euthymic mood, full affect   EKG:   The ekg ordered today demonstrates    Recent Labs: 06/13/2018: BUN 28; Creatinine, Ser 1.71; Potassium 3.5; Sodium 140 07/10/2018: Hemoglobin 12.0; Platelets 228.0; Pro B Natriuretic peptide (BNP) 340.0   Lipid Panel    Component Value Date/Time   CHOL 170 08/31/2014 0941   TRIG 162.0 (H) 08/31/2014 0941   HDL 51.80 08/31/2014 0941   CHOLHDL 3 08/31/2014 0941   VLDL 32.4 08/31/2014 0941   LDLCALC 86 08/31/2014 0941     Other studies Reviewed: Additional studies/ records that were reviewed today with results demonstrating: labs reviewed.   ASSESSMENT AND PLAN:  1. PAF: No AFib recently.  In NSR. Amio to help maintain NSR.  2. Hyperlipidemia: Continue statin. 3. CArotid artery disease: Minimal plaque in 2019 4. SHOB: Getting worse.  I have reviewed the note from [facility] regarding tracheobronchomalacia.  THey recommended weight loss and considered stenting procedure.  SHe has not seen them sinceJan 2019. Check echo and labs.    Current medicines are reviewed at length with the  patient today.  The patient concerns regarding her medicines were addressed.  The following changes have been made:  No change  Labs/ tests ordered today include: BNP, BMet No orders of the defined types were placed in this encounter.   Recommend 150 minutes/week of aerobic exercise Low fat, low carb, high fiber diet recommended  Disposition:   FU in 6 months   Signed, Larae Grooms, MD  11/01/2018 3:15 PM    [facility] [place], [place], [place]  [address] Phone: [phone]; Fax: [phone]

## 2018-11-01 ENCOUNTER — Ambulatory Visit (INDEPENDENT_AMBULATORY_CARE_PROVIDER_SITE_OTHER): Payer: Medicare Other | Admitting: Interventional Cardiology

## 2018-11-01 ENCOUNTER — Other Ambulatory Visit: Payer: Self-pay

## 2018-11-01 ENCOUNTER — Encounter: Payer: Self-pay | Admitting: Interventional Cardiology

## 2018-11-01 VITALS — BP 150/60 | HR 85 | Ht 60.0 in | Wt 209.6 lb

## 2018-11-01 DIAGNOSIS — I480 Paroxysmal atrial fibrillation: Secondary | ICD-10-CM

## 2018-11-01 DIAGNOSIS — I6529 Occlusion and stenosis of unspecified carotid artery: Secondary | ICD-10-CM | POA: Diagnosis not present

## 2018-11-01 DIAGNOSIS — I10 Essential (primary) hypertension: Secondary | ICD-10-CM | POA: Diagnosis not present

## 2018-11-01 DIAGNOSIS — I495 Sick sinus syndrome: Secondary | ICD-10-CM | POA: Diagnosis not present

## 2018-11-01 DIAGNOSIS — R0602 Shortness of breath: Secondary | ICD-10-CM

## 2018-11-01 NOTE — Patient Instructions (Signed)
Medication Instructions:  Your physician recommends that you continue on your current medications as directed. Please refer to the Current Medication list given to you today.  If you need a refill on your cardiac medications before your next appointment, please call your pharmacy.   Lab work: Your physician recommends that you have labs drawn today: Pro BNP BMET   If you have labs (blood work) drawn today and your tests are completely normal, you will receive your results only by: Marland Kitchen MyChart Message (if you have MyChart) OR . A paper copy in the mail If you have any lab test that is abnormal or we need to change your treatment, we will call you to review the results.  Testing/Procedures: Your physician has requested that you have an echocardiogram. Echocardiography is a painless test that uses sound waves to create images of your heart. It provides your doctor with information about the size and shape of your heart and how well your heart's chambers and valves are working. This procedure takes approximately one hour. There are no restrictions for this procedure.    Follow-Up: At [facility], you and your health needs are our priority.  As part of our continuing mission to provide you with exceptional heart care, we have created designated Provider Care Teams.  These Care Teams include your primary Cardiologist (physician) and Advanced Practice Providers (APPs -  Physician Assistants and Nurse Practitioners) who all work together to provide you with the care you need, when you need it. You will need a follow up appointment in 6 months.  Please call our office 2 months in advance to schedule this appointment.  You may see Dr. Irish Lack or one of the following Advanced Practice Providers on your designated Care Team:   [place], PA-C Melina Copa, PA-C . Ermalinda Barrios, PA-C  Any Other Special Instructions Will Be Listed Below (If Applicable).  Check and record BP routinely at  home

## 2018-11-02 LAB — BASIC METABOLIC PANEL
BUN/Creatinine Ratio: 19 (ref 12–28)
BUN: 25 mg/dL (ref 8–27)
CO2: 29 mmol/L (ref 20–29)
Calcium: 9 mg/dL (ref 8.7–10.3)
Chloride: 100 mmol/L (ref 96–106)
Creatinine, Ser: 1.32 mg/dL — ABNORMAL HIGH (ref 0.57–1.00)
GFR calc Af Amer: 42 mL/min/{1.73_m2} — ABNORMAL LOW (ref >59)
GFR calc non Af Amer: 37 mL/min/{1.73_m2} — ABNORMAL LOW (ref >59)
Glucose: 113 mg/dL — ABNORMAL HIGH (ref 65–99)
Potassium: 4 mmol/L (ref 3.5–5.2)
Sodium: 143 mmol/L (ref 134–144)

## 2018-11-02 LAB — PRO B NATRIURETIC PEPTIDE: NT-Pro BNP: 595 pg/mL (ref 0–738)

## 2018-11-07 ENCOUNTER — Ambulatory Visit (HOSPITAL_COMMUNITY): Payer: Medicare Other | Attending: Interventional Cardiology

## 2018-11-07 ENCOUNTER — Other Ambulatory Visit: Payer: Self-pay

## 2018-11-07 DIAGNOSIS — I6529 Occlusion and stenosis of unspecified carotid artery: Secondary | ICD-10-CM | POA: Insufficient documentation

## 2018-11-07 DIAGNOSIS — I10 Essential (primary) hypertension: Secondary | ICD-10-CM | POA: Diagnosis not present

## 2018-11-07 DIAGNOSIS — R0602 Shortness of breath: Secondary | ICD-10-CM | POA: Diagnosis not present

## 2018-11-07 DIAGNOSIS — I495 Sick sinus syndrome: Secondary | ICD-10-CM | POA: Insufficient documentation

## 2018-11-07 DIAGNOSIS — I480 Paroxysmal atrial fibrillation: Secondary | ICD-10-CM | POA: Insufficient documentation

## 2018-11-13 DIAGNOSIS — N183 Chronic kidney disease, stage 3 (moderate): Secondary | ICD-10-CM | POA: Diagnosis not present

## 2018-11-13 DIAGNOSIS — Z9989 Dependence on other enabling machines and devices: Secondary | ICD-10-CM | POA: Diagnosis not present

## 2018-11-13 DIAGNOSIS — N2581 Secondary hyperparathyroidism of renal origin: Secondary | ICD-10-CM | POA: Diagnosis not present

## 2018-11-13 DIAGNOSIS — M1990 Unspecified osteoarthritis, unspecified site: Secondary | ICD-10-CM | POA: Diagnosis not present

## 2018-11-13 DIAGNOSIS — E669 Obesity, unspecified: Secondary | ICD-10-CM | POA: Diagnosis not present

## 2018-11-13 DIAGNOSIS — Z0000 Encounter for general adult medical examination without abnormal findings: Secondary | ICD-10-CM | POA: Diagnosis not present

## 2018-11-13 DIAGNOSIS — Z7901 Long term (current) use of anticoagulants: Secondary | ICD-10-CM | POA: Diagnosis not present

## 2018-11-13 DIAGNOSIS — E1122 Type 2 diabetes mellitus with diabetic chronic kidney disease: Secondary | ICD-10-CM | POA: Diagnosis not present

## 2018-11-13 DIAGNOSIS — G4733 Obstructive sleep apnea (adult) (pediatric): Secondary | ICD-10-CM | POA: Diagnosis not present

## 2018-11-13 DIAGNOSIS — J398 Other specified diseases of upper respiratory tract: Secondary | ICD-10-CM | POA: Diagnosis not present

## 2018-11-13 DIAGNOSIS — D631 Anemia in chronic kidney disease: Secondary | ICD-10-CM | POA: Diagnosis not present

## 2018-11-13 DIAGNOSIS — I129 Hypertensive chronic kidney disease with stage 1 through stage 4 chronic kidney disease, or unspecified chronic kidney disease: Secondary | ICD-10-CM | POA: Diagnosis not present

## 2018-11-20 DIAGNOSIS — I4891 Unspecified atrial fibrillation: Secondary | ICD-10-CM | POA: Diagnosis not present

## 2018-11-20 DIAGNOSIS — Z7901 Long term (current) use of anticoagulants: Secondary | ICD-10-CM | POA: Diagnosis not present

## 2018-11-20 DIAGNOSIS — R0602 Shortness of breath: Secondary | ICD-10-CM | POA: Diagnosis not present

## 2018-11-22 DIAGNOSIS — I10 Essential (primary) hypertension: Secondary | ICD-10-CM | POA: Diagnosis not present

## 2018-11-25 DIAGNOSIS — L821 Other seborrheic keratosis: Secondary | ICD-10-CM | POA: Diagnosis not present

## 2018-11-25 DIAGNOSIS — D485 Neoplasm of uncertain behavior of skin: Secondary | ICD-10-CM | POA: Diagnosis not present

## 2018-11-25 DIAGNOSIS — Z08 Encounter for follow-up examination after completed treatment for malignant neoplasm: Secondary | ICD-10-CM | POA: Diagnosis not present

## 2018-11-25 DIAGNOSIS — D0471 Carcinoma in situ of skin of right lower limb, including hip: Secondary | ICD-10-CM | POA: Diagnosis not present

## 2018-11-25 DIAGNOSIS — L570 Actinic keratosis: Secondary | ICD-10-CM | POA: Diagnosis not present

## 2018-11-25 DIAGNOSIS — Z85828 Personal history of other malignant neoplasm of skin: Secondary | ICD-10-CM | POA: Diagnosis not present

## 2018-11-26 DIAGNOSIS — R0602 Shortness of breath: Secondary | ICD-10-CM | POA: Diagnosis not present

## 2018-11-26 DIAGNOSIS — I495 Sick sinus syndrome: Secondary | ICD-10-CM | POA: Diagnosis not present

## 2018-11-26 DIAGNOSIS — Z950 Presence of cardiac pacemaker: Secondary | ICD-10-CM | POA: Diagnosis not present

## 2018-11-26 DIAGNOSIS — I10 Essential (primary) hypertension: Secondary | ICD-10-CM | POA: Diagnosis not present

## 2018-11-26 DIAGNOSIS — J984 Other disorders of lung: Secondary | ICD-10-CM | POA: Diagnosis not present

## 2018-12-18 DIAGNOSIS — I4891 Unspecified atrial fibrillation: Secondary | ICD-10-CM | POA: Diagnosis not present

## 2018-12-18 DIAGNOSIS — Z7901 Long term (current) use of anticoagulants: Secondary | ICD-10-CM | POA: Diagnosis not present

## 2018-12-24 ENCOUNTER — Telehealth: Payer: Self-pay

## 2018-12-24 NOTE — Telephone Encounter (Signed)
Left message for patient to call back on Friday.  Dr. Irish Lack reviewed the letter she sent him. Patient with normal echo, no HF on CPX (Sx most likely respiratory or deconditioning), and is already on lasix 120 mg QD. Per Dr. Irish Lack we can try to increase metoprolol to 75 mg BID for elevated HRs. No other changes at this time.

## 2018-12-26 DIAGNOSIS — Z23 Encounter for immunization: Secondary | ICD-10-CM | POA: Diagnosis not present

## 2018-12-31 DIAGNOSIS — Z5189 Encounter for other specified aftercare: Secondary | ICD-10-CM | POA: Diagnosis not present

## 2019-01-08 DIAGNOSIS — I4891 Unspecified atrial fibrillation: Secondary | ICD-10-CM | POA: Diagnosis not present

## 2019-01-08 DIAGNOSIS — Z7901 Long term (current) use of anticoagulants: Secondary | ICD-10-CM | POA: Diagnosis not present

## 2019-01-09 MED ORDER — METOPROLOL TARTRATE 25 MG PO TABS: 75.0000 mg | ORAL_TABLET | Freq: Two times a day (BID) | ORAL | 11 refills | Status: DC

## 2019-01-09 NOTE — Telephone Encounter (Signed)
Patient is returning call.  °

## 2019-01-09 NOTE — Telephone Encounter (Signed)
Left message for patient to call back  

## 2019-01-09 NOTE — Telephone Encounter (Signed)
Called and made patient aware of recommendations to increase metoprolol to 75 mg BID. She verbalized understanding and thanked me for the call.

## 2019-01-10 ENCOUNTER — Other Ambulatory Visit: Payer: Self-pay

## 2019-01-10 ENCOUNTER — Emergency Department (HOSPITAL_COMMUNITY)
Admission: EM | Admit: 2019-01-10 | Discharge: 2019-01-10 | Disposition: A | Discharge status: Home / Self Care | Payer: Medicare Other | Attending: Emergency Medicine | Admitting: Emergency Medicine

## 2019-01-10 ENCOUNTER — Emergency Department (HOSPITAL_COMMUNITY): Payer: Medicare Other

## 2019-01-10 DIAGNOSIS — I2510 Atherosclerotic heart disease of native coronary artery without angina pectoris: Secondary | ICD-10-CM | POA: Diagnosis not present

## 2019-01-10 DIAGNOSIS — R42 Dizziness and giddiness: Secondary | ICD-10-CM | POA: Insufficient documentation

## 2019-01-10 DIAGNOSIS — R531 Weakness: Secondary | ICD-10-CM

## 2019-01-10 DIAGNOSIS — J449 Chronic obstructive pulmonary disease, unspecified: Secondary | ICD-10-CM | POA: Insufficient documentation

## 2019-01-10 DIAGNOSIS — Z8673 Personal history of transient ischemic attack (TIA), and cerebral infarction without residual deficits: Secondary | ICD-10-CM | POA: Insufficient documentation

## 2019-01-10 DIAGNOSIS — I4891 Unspecified atrial fibrillation: Secondary | ICD-10-CM | POA: Insufficient documentation

## 2019-01-10 DIAGNOSIS — I10 Essential (primary) hypertension: Secondary | ICD-10-CM | POA: Diagnosis not present

## 2019-01-10 DIAGNOSIS — Z7901 Long term (current) use of anticoagulants: Secondary | ICD-10-CM | POA: Diagnosis not present

## 2019-01-10 DIAGNOSIS — R55 Syncope and collapse: Secondary | ICD-10-CM | POA: Diagnosis not present

## 2019-01-10 DIAGNOSIS — R9431 Abnormal electrocardiogram [ECG] [EKG]: Secondary | ICD-10-CM | POA: Diagnosis not present

## 2019-01-10 DIAGNOSIS — Z79899 Other long term (current) drug therapy: Secondary | ICD-10-CM | POA: Insufficient documentation

## 2019-01-10 DIAGNOSIS — R0602 Shortness of breath: Secondary | ICD-10-CM | POA: Diagnosis not present

## 2019-01-10 LAB — CBC WITH DIFFERENTIAL/PLATELET
Abs Immature Granulocytes: 0.01 10*3/uL (ref 0.00–0.07)
Basophils Absolute: 0 10*3/uL (ref 0.0–0.1)
Basophils Relative: 1 %
Eosinophils Absolute: 0.1 10*3/uL (ref 0.0–0.5)
Eosinophils Relative: 2 %
HCT: 39.2 % (ref 36.0–46.0)
Hemoglobin: 12.5 g/dL (ref 12.0–15.0)
Immature Granulocytes: 0 %
Lymphocytes Relative: 28 %
Lymphs Abs: 1.5 10*3/uL (ref 0.7–4.0)
MCH: 32.5 pg (ref 26.0–34.0)
MCHC: 31.9 g/dL (ref 30.0–36.0)
MCV: 101.8 fL — ABNORMAL HIGH (ref 80.0–100.0)
Monocytes Absolute: 0.5 10*3/uL (ref 0.1–1.0)
Monocytes Relative: 8 %
Neutro Abs: 3.3 10*3/uL (ref 1.7–7.7)
Neutrophils Relative %: 61 %
Platelets: 240 10*3/uL (ref 150–400)
RBC: 3.85 MIL/uL — ABNORMAL LOW (ref 3.87–5.11)
RDW: 15 % (ref 11.5–15.5)
WBC: 5.4 10*3/uL (ref 4.0–10.5)
nRBC: 0 % (ref 0.0–0.2)

## 2019-01-10 LAB — COMPREHENSIVE METABOLIC PANEL
ALT: 15 U/L (ref 0–44)
AST: 19 U/L (ref 15–41)
Albumin: 4.3 g/dL (ref 3.5–5.0)
Alkaline Phosphatase: 67 U/L (ref 38–126)
Anion gap: 14 (ref 5–15)
BUN: 28 mg/dL — ABNORMAL HIGH (ref 8–23)
CO2: 30 mmol/L (ref 22–32)
Calcium: 9.7 mg/dL (ref 8.9–10.3)
Chloride: 92 mmol/L — ABNORMAL LOW (ref 98–111)
Creatinine, Ser: 1.5 mg/dL — ABNORMAL HIGH (ref 0.44–1.00)
GFR calc Af Amer: 36 mL/min — ABNORMAL LOW (ref >60)
GFR calc non Af Amer: 31 mL/min — ABNORMAL LOW (ref >60)
Glucose, Bld: 123 mg/dL — ABNORMAL HIGH (ref 70–99)
Potassium: 3.7 mmol/L (ref 3.5–5.1)
Sodium: 136 mmol/L (ref 135–145)
Total Bilirubin: 0.7 mg/dL (ref 0.3–1.2)
Total Protein: 7.1 g/dL (ref 6.5–8.1)

## 2019-01-10 LAB — MAGNESIUM: Magnesium: 2.4 mg/dL (ref 1.7–2.4)

## 2019-01-10 LAB — PROTIME-INR
INR: 1.5 — ABNORMAL HIGH (ref 0.8–1.2)
Prothrombin Time: 17.8 seconds — ABNORMAL HIGH (ref 11.4–15.2)

## 2019-01-10 LAB — BRAIN NATRIURETIC PEPTIDE: B Natriuretic Peptide: 171 pg/mL — ABNORMAL HIGH (ref 0.0–100.0)

## 2019-01-10 MED ORDER — PREDNISONE 20 MG PO TABS: 40.0000 mg | ORAL_TABLET | Freq: Every day | ORAL | 0 refills | Status: DC

## 2019-01-10 MED ORDER — SODIUM CHLORIDE 0.9 % IV SOLN: INTRAVENOUS | Status: DC

## 2019-01-10 MED ORDER — PREDNISONE 20 MG PO TABS
40.0000 mg | ORAL_TABLET | ORAL | Status: AC
  Administered 2019-01-10: 40 mg via ORAL
  Filled 2019-01-10: qty 2

## 2019-01-10 NOTE — ED Provider Notes (Signed)
[facility] EMERGENCY DEPARTMENT Provider Note   CSN: [id number] Arrival date & time: 01/10/19  1120     History   Chief Complaint Chief Complaint  Patient presents with  . Near Syncope  . Dizziness    HPI Lynn Dunn is a 85 y.o. female.     HPI Patient presents from nursing facility after episode of near syncope. Patient states that she is been doing generally well. She acknowledges a history of multiple medical issues including hypertension for which she takes beta-blocker. She also has a history of pulmonary disease, but where the chart has her diagnosis of COPD, she refutes this diagnosis, states that she has tracheomalacia. She has been doing generally well, but today, an episode of lightheadedness, near syncope, without complete loss of consciousness, or falling. Currently she has no ongoing complaints beyond feeling generally poor. She notes that she has felt poorly for about 1 year, has seen her physicians, primary care, pulmonology, cardiology during this episode. No clear etiology for her generalized discomfort. However, no focal weakness, no fever, no current pain, no current increased dyspnea.  EMS reports that the patient had increase of 25 mg in her beta-blocker, and after taking today's medication, not long thereafter, had an episode of near syncope. She was subsequently found to be hypertensive, with systolic greater than 938, and in transport had no ongoing complaints.  Past Medical History:  Diagnosis Date  . Airway malacia   . Anemia   . Anginal pain ([place]) 2011   had to take nitro   . Arthritis   . COPD (chronic obstructive pulmonary disease) ([place])   . Dysuria   . GERD (gastroesophageal reflux disease)   . Hematuria   . HTN (hypertension)   . Hx of echocardiogram    Echo (12/15):  Mild LVH, EF 60-65%, mild MR, mod LAE (LA 49 mm), PASP 43 mmHg  . Obesity   . OSA (obstructive sleep apnea)    cpap at home  . PAF (paroxysmal  atrial fibrillation) ([place])    s/p ablation 07/29/09  . Sick sinus syndrome ([facility])    pauses with syncope  . Stroke ([place]) 6/09   NO RESIDUAL PROBLEMS    Patient Active Problem List   Diagnosis Date Noted  . Diastolic dysfunction 18/29/9371  . Acute respiratory failure with hypoxia ([place]) 06/13/2018  . Chest pain 06/13/2018  . Abnormal EKG 06/13/2018  . Airway malacia   . COPD exacerbation ([place]) 06/12/2018  . Restrictive lung disease 01/18/2017  . 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/07/2016  . COPD (chronic obstructive pulmonary disease) ([place]) 04/05/2016  . Subtherapeutic international normalized ratio (INR) 04/05/2016  . Atrial fibrillation with rapid ventricular response ([place]) 04/04/2016  . Carotid artery disease ([place]) 11/25/2015  . Pre-syncope 02/16/2015  . Sick sinus syndrome ([place]) 02/15/2015  . Swelling of right lower extremity 08/31/2014  . Mixed hyperlipidemia 12/27/2012  . Obesity 12/27/2012  . GI bleed 01/23/2012  . Anemia 01/23/2012  . History of CVA (cerebrovascular accident) 01/23/2012  . SHORTNESS OF BREATH (SOB) 09/13/2009  . Essential hypertension, benign 06/17/2009  . Atrial fibrillation ([place]) 06/17/2009  . OSA (obstructive sleep apnea) 06/17/2009    Past Surgical History:  Procedure Laterality Date  . ABDOMINAL HYSTERECTOMY    . ATRIAL ABLATION SURGERY     s/p ablation for afib 07/29/09  . CYSTOSCOPY N/A 06/23/2015   Procedure: CYSTOSCOPY;  Surgeon: Alexis Frock, MD;  Location: [facility];  Service: Urology;  Laterality: N/A;  . EP IMPLANTABLE DEVICE N/A 02/17/2015  MDT Advisa MRI PPM implanted by Dr Lovena Le for sick sinus and syncope  . EP IMPLANTABLE DEVICE N/A 02/17/2015   Procedure: Loop Recorder Removal;  Surgeon: Evans Lance, MD;  Location: [facility];  Service: Cardiovascular;  Laterality: N/A;  . ESOPHAGOGASTRODUODENOSCOPY  01/24/2012   Procedure: ESOPHAGOGASTRODUODENOSCOPY (EGD);  Surgeon: Jeryl Columbia, MD;  Location[facility] ENDOSCOPY;  Service: Endoscopy;   Laterality: N/A;  . KNEE ARTHROSCOPY     BILATERAL  . LOOP RECORDER IMPLANT N/A 03/12/2014   Procedure: LOOP RECORDER IMPLANT;  Surgeon: Thompson Grayer, MD;  Location: [facility] CATH LAB;  Service: Cardiovascular;  Laterality: N/A;  . LYSIS OF ADHESION N/A 06/23/2015   Procedure: LYSIS OF LABIAL ADHESION;  Surgeon: Alexis Frock, MD;  Location: [facility];  Service: Urology;  Laterality: N/A;  . VIDEO BRONCHOSCOPY Bilateral 12/15/2016   Procedure: VIDEO BRONCHOSCOPY WITHOUT FLUORO;  Surgeon: Collene Gobble, MD;  Location: [facility] ENDOSCOPY;  Service: Cardiopulmonary;  Laterality: Bilateral;     OB History   No obstetric history on file.      Home Medications    Prior to Admission medications   Medication Sig Start Date End Date Taking? Authorizing Provider  amiodarone (PACERONE) 200 MG tablet Take 0.5 tablets (100 mg total) by mouth daily. Patient taking differently: Take 50 mg by mouth daily. Take one quarter tablet by mouth daily. 06/13/18   Black, Lezlie Octave, NP  calcitRIOL (ROCALTROL) 0.25 MCG capsule Take 0.25 mcg by mouth at bedtime.  05/07/17   [provider]  Coenzyme Q10 (COQ10 PO) Take 1 capsule by mouth at bedtime.     [provider]  estradiol (ESTRACE VAGINAL) 0.1 MG/GM vaginal cream Apply pea-sized amount to vagina / urethra 2-3 x weekly to prevent labial adhesions Patient taking differently: Place vaginally See admin instructions. Apply a pea-sized amount to vagina/urethra 2-3 times a week to prevent labial adhesions 06/23/15   Alexis Frock, MD  furosemide (LASIX) 40 MG tablet Take 120 mg by mouth daily. Take 3 tablets by mouth daily.    [provider]  Iron-FA-B Cmp-C-Biot-Probiotic (FUSION PLUS) CAPS Take 1 capsule by mouth every 7 (seven) days.  03/26/18   [provider]  levothyroxine (SYNTHROID, LEVOTHROID) 50 MCG tablet Take 50 mcg by mouth daily before breakfast.  05/13/18   [provider]  Magnesium 100 MG CAPS Take 100 mg by mouth 2 (two)  times daily.     [provider]  metoprolol tartrate (LOPRESSOR) 25 MG tablet Take 3 tablets (75 mg total) by mouth 2 (two) times daily. 01/09/19   Jettie Booze, MD  potassium chloride SA (K-DUR,KLOR-CON) 20 MEQ tablet Take 20 mEq by mouth 3 (three) times a week.     [provider]  rosuvastatin (CRESTOR) 10 MG tablet Take 10 mg by mouth at bedtime.  09/14/16   [provider]  tiotropium (SPIRIVA) 18 MCG inhalation capsule Place 1 capsule (18 mcg total) into inhaler and inhale daily. 07/10/18   Martyn Ehrich, NP  Turmeric 500 MG CAPS Take 500 mg by mouth 2 (two) times daily.    [provider]  warfarin (COUMADIN) 3 MG tablet Take 1.5-3 mg by mouth See admin instructions. Take 1.5 mg by mouth in the evening on Sun/Wed and 3 mg on Mon/Tues/Thurs/Fri/Sat    [provider]    Family History Family History  Problem Relation Age of Onset  . Breast cancer Mother   . Brain cancer Father   .  Breast cancer Other   . Brain cancer Other   . Stroke Maternal Aunt   . Hypertension Sister   . Heart attack Neg Hx     Social History Social History   Tobacco Use  . Smoking status: Never Smoker  . Smokeless tobacco: Never Used  Substance Use Topics  . Alcohol use: No  . Drug use: No     Allergies   Ace inhibitors, Albuterol, Albuterol sulfate, Oysters [shellfish allergy], and Sulfa antibiotics   Review of Systems Review of Systems  Constitutional:       Per HPI, otherwise negative  HENT:       Per HPI, otherwise negative  Respiratory:       Per HPI, otherwise negative  Cardiovascular:       Per HPI, otherwise negative  Gastrointestinal: Negative for vomiting.  Endocrine:       Negative aside from HPI  Genitourinary:       Neg aside from HPI   Musculoskeletal:       Per HPI, otherwise negative  Skin: Negative.   Neurological: Positive for weakness and light-headedness. Negative for syncope.     Physical Exam Updated  Vital Signs BP (!) 158/67   Pulse 78   Temp 97.6 F (36.4 C) (Oral)   Resp 19   LMP  (LMP Unknown)   SpO2 99%   Physical Exam Vitals signs and nursing note reviewed.  Constitutional:      General: She is not in acute distress.    Appearance: She is well-developed. She is obese. She is ill-appearing. She is not diaphoretic.  HENT:     Head: Normocephalic and atraumatic.  Eyes:     Conjunctiva/sclera: Conjunctivae normal.  Cardiovascular:     Rate and Rhythm: Normal rate and regular rhythm.  Pulmonary:     Effort: Pulmonary effort is normal. No respiratory distress.     Breath sounds: Normal breath sounds. No stridor. No wheezing or rhonchi.  Abdominal:     General: There is no distension.  Skin:    General: Skin is warm and dry.  Neurological:     Mental Status: She is alert and oriented to person, place, and time.     Cranial Nerves: No cranial nerve deficit.      ED Treatments / Results  Labs (all labs ordered are listed, but only abnormal results are displayed) Labs Reviewed  COMPREHENSIVE METABOLIC PANEL - Abnormal; Notable for the following components:      Result Value   Chloride 92 (*)    Glucose, Bld 123 (*)    BUN 28 (*)    Creatinine, Ser 1.50 (*)    GFR calc non Af Amer 31 (*)    GFR calc Af Amer 36 (*)    All other components within normal limits  CBC WITH DIFFERENTIAL/PLATELET - Abnormal; Notable for the following components:   RBC 3.85 (*)    MCV 101.8 (*)    All other components within normal limits  BRAIN NATRIURETIC PEPTIDE - Abnormal; Notable for the following components:   B Natriuretic Peptide 171.0 (*)    All other components within normal limits  PROTIME-INR - Abnormal; Notable for the following components:   Prothrombin Time 17.8 (*)    INR 1.5 (*)    All other components within normal limits  MAGNESIUM  CBG MONITORING, ED    EKG EKG Interpretation  Date/Time:  Friday January 10 2019 11:32:09 EST Ventricular Rate:  74 PR  Interval:  QRS Duration: 106 QT Interval:  428 QTC Calculation: 475 R Axis:   -59 Text Interpretation: Sinus rhythm Prolonged PR interval Left anterior fascicular block Anteroseptal infarct, age indeterminate No significant change since last tracing Abnormal ECG Confirmed by Carmin Muskrat [phone]) on 01/10/2019 11:51:52 AM   Radiology Dg Chest Port 1 View  Result Date: 01/10/2019 CLINICAL DATA:  Near syncope.  Dizziness EXAM: PORTABLE CHEST 1 VIEW COMPARISON:  09/30/2016 FINDINGS: Left chest wall pacer device is noted with lead in the right atrial appendage and right ventricle. The heart size is normal. Aortic atherosclerosis. The lungs are clear. No pleural effusion or edema. IMPRESSION: 1. No acute cardiopulmonary abnormalities. Electronically Signed   By: Kerby Moors M.D.   On: 01/10/2019 11:49    Procedures Procedures (including critical care time)  Medications Ordered in ED Medications  0.9 %  sodium chloride infusion (has no administration in time range)     Initial Impression / Assessment and Plan / ED Course  I have reviewed the triage vital signs and the nursing notes.  Pertinent labs & imaging results that were available during my care of the patient were reviewed by me and considered in my medical decision making (see chart for details).        1:46 PM Patient in no distress, awake, alert, hemodynamically unremarkable. We discussed all findings at length, and her x-ray, all reassuring, no evidence for acute new pathology, labs consistent with prior studies.  She has no ongoing complaints, has had no recurrence of her near syncope. With some suspicion for iatrogenic episode given her recent increase in beta-blocker dosing, the patient will reduce her dosing back to prior levels. We discussed possibilities for her ongoing, year-long discomfort, including progression of disease, medication interactions and/or persistency of pulmonary disease, though her diagnosis of  COPD seems less than definite. Patient amenable to a short course of steroids for concern for her ongoing mild dyspnea, weakness. She will follow up with primary care.  Final Clinical Impressions(s) / ED Diagnoses   Final diagnoses:  Near syncope  Weakness    ED Discharge Orders         Ordered    predniSONE (DELTASONE) 20 MG tablet  Daily with breakfast     01/10/19 1349           Carmin Muskrat, MD 01/10/19 1349

## 2019-01-10 NOTE — Discharge Instructions (Signed)
As discussed, your evaluation today has been largely reassuring.  But, it is important that you monitor your condition carefully, and do not hesitate to return to the ED if you develop new, or concerning changes in your condition. ? ?Otherwise, please follow-up with your physician for appropriate ongoing care. ? ?

## 2019-01-10 NOTE — ED Notes (Signed)
Pt from [facility]

## 2019-01-10 NOTE — ED Notes (Signed)
Pt reports her ride is here. Pt wheeled out of ED. A&OX4, Pt verbalized understanding of d/c instructions and follow up care.

## 2019-01-10 NOTE — ED Notes (Signed)
Pt trying to get in contact with staff at [facility] to arrange transportation back to her home there at [facility]. Pt moved to hallway room until we have confirmed she has a ride home.

## 2019-01-11 ENCOUNTER — Encounter (HOSPITAL_BASED_OUTPATIENT_CLINIC_OR_DEPARTMENT_OTHER): Payer: Self-pay | Admitting: Emergency Medicine

## 2019-01-11 ENCOUNTER — Emergency Department (HOSPITAL_BASED_OUTPATIENT_CLINIC_OR_DEPARTMENT_OTHER)
Admission: EM | Admit: 2019-01-11 | Discharge: 2019-01-11 | Disposition: A | Discharge status: Home / Self Care | Payer: Medicare Other | Attending: Emergency Medicine | Admitting: Emergency Medicine

## 2019-01-11 ENCOUNTER — Emergency Department (HOSPITAL_BASED_OUTPATIENT_CLINIC_OR_DEPARTMENT_OTHER): Payer: Medicare Other

## 2019-01-11 ENCOUNTER — Other Ambulatory Visit: Payer: Self-pay

## 2019-01-11 DIAGNOSIS — N184 Chronic kidney disease, stage 4 (severe): Secondary | ICD-10-CM | POA: Diagnosis not present

## 2019-01-11 DIAGNOSIS — R42 Dizziness and giddiness: Secondary | ICD-10-CM | POA: Diagnosis not present

## 2019-01-11 DIAGNOSIS — Z7901 Long term (current) use of anticoagulants: Secondary | ICD-10-CM | POA: Diagnosis not present

## 2019-01-11 DIAGNOSIS — R5381 Other malaise: Secondary | ICD-10-CM | POA: Diagnosis not present

## 2019-01-11 DIAGNOSIS — J449 Chronic obstructive pulmonary disease, unspecified: Secondary | ICD-10-CM | POA: Insufficient documentation

## 2019-01-11 DIAGNOSIS — Z8673 Personal history of transient ischemic attack (TIA), and cerebral infarction without residual deficits: Secondary | ICD-10-CM | POA: Insufficient documentation

## 2019-01-11 DIAGNOSIS — I129 Hypertensive chronic kidney disease with stage 1 through stage 4 chronic kidney disease, or unspecified chronic kidney disease: Secondary | ICD-10-CM | POA: Insufficient documentation

## 2019-01-11 DIAGNOSIS — I10 Essential (primary) hypertension: Secondary | ICD-10-CM

## 2019-01-11 DIAGNOSIS — Z79899 Other long term (current) drug therapy: Secondary | ICD-10-CM | POA: Insufficient documentation

## 2019-01-11 LAB — CBC WITH DIFFERENTIAL/PLATELET
Abs Immature Granulocytes: 0.02 10*3/uL (ref 0.00–0.07)
Basophils Absolute: 0 10*3/uL (ref 0.0–0.1)
Basophils Relative: 0 %
Eosinophils Absolute: 0 10*3/uL (ref 0.0–0.5)
Eosinophils Relative: 1 %
HCT: 38.2 % (ref 36.0–46.0)
Hemoglobin: 12.2 g/dL (ref 12.0–15.0)
Immature Granulocytes: 0 %
Lymphocytes Relative: 21 %
Lymphs Abs: 1.7 10*3/uL (ref 0.7–4.0)
MCH: 32.5 pg (ref 26.0–34.0)
MCHC: 31.9 g/dL (ref 30.0–36.0)
MCV: 101.9 fL — ABNORMAL HIGH (ref 80.0–100.0)
Monocytes Absolute: 0.7 10*3/uL (ref 0.1–1.0)
Monocytes Relative: 8 %
Neutro Abs: 5.8 10*3/uL (ref 1.7–7.7)
Neutrophils Relative %: 70 %
Platelets: 230 10*3/uL (ref 150–400)
RBC: 3.75 MIL/uL — ABNORMAL LOW (ref 3.87–5.11)
RDW: 15.1 % (ref 11.5–15.5)
WBC: 8.3 10*3/uL (ref 4.0–10.5)
nRBC: 0 % (ref 0.0–0.2)

## 2019-01-11 LAB — URINALYSIS, ROUTINE W REFLEX MICROSCOPIC
Bilirubin Urine: NEGATIVE
Glucose, UA: NEGATIVE mg/dL
Hgb urine dipstick: NEGATIVE
Ketones, ur: NEGATIVE mg/dL
Nitrite: NEGATIVE
Protein, ur: NEGATIVE mg/dL
Specific Gravity, Urine: <1.005 — ABNORMAL LOW (ref 1.005–1.030)
pH: 7.5 (ref 5.0–8.0)

## 2019-01-11 LAB — BASIC METABOLIC PANEL
Anion gap: 13 (ref 5–15)
BUN: 39 mg/dL — ABNORMAL HIGH (ref 8–23)
CO2: 30 mmol/L (ref 22–32)
Calcium: 9.1 mg/dL (ref 8.9–10.3)
Chloride: 95 mmol/L — ABNORMAL LOW (ref 98–111)
Creatinine, Ser: 1.72 mg/dL — ABNORMAL HIGH (ref 0.44–1.00)
GFR calc Af Amer: 31 mL/min — ABNORMAL LOW (ref >60)
GFR calc non Af Amer: 27 mL/min — ABNORMAL LOW (ref >60)
Glucose, Bld: 125 mg/dL — ABNORMAL HIGH (ref 70–99)
Potassium: 3.3 mmol/L — ABNORMAL LOW (ref 3.5–5.1)
Sodium: 138 mmol/L (ref 135–145)

## 2019-01-11 LAB — URINALYSIS, MICROSCOPIC (REFLEX): RBC / HPF: NONE SEEN RBC/hpf (ref 0–5)

## 2019-01-11 MED ORDER — SODIUM CHLORIDE 0.9 % IV BOLUS
500.0000 mL | Freq: Once | INTRAVENOUS | Status: AC
  Administered 2019-01-11: 500 mL via INTRAVENOUS

## 2019-01-11 MED ORDER — POTASSIUM CHLORIDE CRYS ER 20 MEQ PO TBCR
20.0000 meq | EXTENDED_RELEASE_TABLET | Freq: Once | ORAL | Status: AC
  Administered 2019-01-11: 20 meq via ORAL
  Filled 2019-01-11: qty 1

## 2019-01-11 NOTE — ED Notes (Signed)
Pt reports multiple increased BP readings today. Reports taking 2 extra doses of metoprolol. Had work-up yesterday at [facility].

## 2019-01-11 NOTE — ED Notes (Signed)
PA at bedside.

## 2019-01-11 NOTE — ED Notes (Signed)
Lab informed of urine specimen.

## 2019-01-11 NOTE — ED Provider Notes (Signed)
[facility] EMERGENCY DEPARTMENT Provider Note   CSN: [id number] Arrival date & time: 01/11/19  1617     History   Chief Complaint Chief Complaint  Patient presents with  . Hypertension    HPI Lynn Dunn is a 85 y.o. female with a past medical history of hypertension, COPD, GERD presenting to the ED with a chief complaint of "feeling strange."  Patient was seen and evaluated yesterday for hypertension and a similar sensation of feeling "strange."  She was told to increase her metoprolol 2 days ago by her PCP from 50mg  twice daily to 75 mg twice daily.  States that she took the 53 as prescribed and felt like "everything bottomed out."  After yesterday's evaluation in the ED and reassuring work-up, she was told to go back to the 50 mg.  She woke up this morning and checked her blood pressure was in the 976B systolic.  She then took 100 mg of metoprolol and presented to the ED.  She has no specific complaint, states that she has a "high blood pressure headache" that has gradually improved since being in the ED.  Reports shortness of breath for the past several years due to her COPD but denies any changes from prior.  Denies any chest pain, vision changes, numbness in arms or legs, urinary symptoms, vomiting, abdominal pain.     HPI  Past Medical History:  Diagnosis Date  . Airway malacia   . Anemia   . Anginal pain ([place]) 2011   had to take nitro   . Arthritis   . COPD (chronic obstructive pulmonary disease) ([place])   . Dysuria   . GERD (gastroesophageal reflux disease)   . Hematuria   . HTN (hypertension)   . Hx of echocardiogram    Echo (12/15):  Mild LVH, EF 60-65%, mild MR, mod LAE (LA 49 mm), PASP 43 mmHg  . Obesity   . OSA (obstructive sleep apnea)    cpap at home  . PAF (paroxysmal atrial fibrillation) ([place])    s/p ablation 07/29/09  . Sick sinus syndrome (H[facility])    pauses with syncope  . Stroke ([place]) 6/09   NO RESIDUAL PROBLEMS    Patient Active Problem  List   Diagnosis Date Noted  . Diastolic dysfunction 34/19/3790  . Acute respiratory failure with hypoxia ([place]) 06/13/2018  . Chest pain 06/13/2018  . Abnormal EKG 06/13/2018  . Airway malacia   . COPD exacerbation ([place]) 06/12/2018  . Restrictive lung disease 01/18/2017  . 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/07/2016  . COPD (chronic obstructive pulmonary disease) ([place]) 04/05/2016  . Subtherapeutic international normalized ratio (INR) 04/05/2016  . Atrial fibrillation with rapid ventricular response ([place]) 04/04/2016  . Carotid artery disease ([place]) 11/25/2015  . Pre-syncope 02/16/2015  . Sick sinus syndrome ([place]) 02/15/2015  . Swelling of right lower extremity 08/31/2014  . Mixed hyperlipidemia 12/27/2012  . Obesity 12/27/2012  . GI bleed 01/23/2012  . Anemia 01/23/2012  . History of CVA (cerebrovascular accident) 01/23/2012  . SHORTNESS OF BREATH (SOB) 09/13/2009  . Essential hypertension, benign 06/17/2009  . Atrial fibrillation ([place]) 06/17/2009  . OSA (obstructive sleep apnea) 06/17/2009    Past Surgical History:  Procedure Laterality Date  . ABDOMINAL HYSTERECTOMY    . ATRIAL ABLATION SURGERY     s/p ablation for afib 07/29/09  . CYSTOSCOPY N/A 06/23/2015   Procedure: CYSTOSCOPY;  Surgeon: Alexis Frock, MD;  Location: [facility] ORS;  Service: Urology;  Laterality: N/A;  . EP IMPLANTABLE DEVICE N/A 02/17/2015  MDT Advisa MRI PPM implanted by Dr Lovena Le for sick sinus and syncope  . EP IMPLANTABLE DEVICE N/A 02/17/2015   Procedure: Loop Recorder Removal;  Surgeon: Evans Lance, MD;  Location: [facility] CV LAB;  Service: Cardiovascular;  Laterality: N/A;  . ESOPHAGOGASTRODUODENOSCOPY  01/24/2012   Procedure: ESOPHAGOGASTRODUODENOSCOPY (EGD);  Surgeon: Jeryl Columbia, MD;  Location: [facility] ENDOSCOPY;  Service: Endoscopy;  Laterality: N/A;  . KNEE ARTHROSCOPY     BILATERAL  . LOOP RECORDER IMPLANT N/A 03/12/2014   Procedure: LOOP RECORDER IMPLANT;  Surgeon: Thompson Grayer, MD;  Location: [facility] CATH LAB;   Service: Cardiovascular;  Laterality: N/A;  . LYSIS OF ADHESION N/A 06/23/2015   Procedure: LYSIS OF LABIAL ADHESION;  Surgeon: Alexis Frock, MD;  Location: [facility];  Service: Urology;  Laterality: N/A;  . VIDEO BRONCHOSCOPY Bilateral 12/15/2016   Procedure: VIDEO BRONCHOSCOPY WITHOUT FLUORO;  Surgeon: Collene Gobble, MD;  Location: [facility] ENDOSCOPY;  Service: Cardiopulmonary;  Laterality: Bilateral;     OB History   No obstetric history on file.      Home Medications    Prior to Admission medications   Medication Sig Start Date End Date Taking? Authorizing Provider  amiodarone (PACERONE) 200 MG tablet Take 0.5 tablets (100 mg total) by mouth daily. Patient taking differently: Take 50 mg by mouth daily. Take one quarter tablet by mouth daily. 06/13/18   Black, Lezlie Octave, NP  calcitRIOL (ROCALTROL) 0.25 MCG capsule Take 0.25 mcg by mouth at bedtime.  05/07/17   [provider]  Coenzyme Q10 (COQ10 PO) Take 1 capsule by mouth at bedtime.     [provider]  estradiol (ESTRACE VAGINAL) 0.1 MG/GM vaginal cream Apply pea-sized amount to vagina / urethra 2-3 x weekly to prevent labial adhesions Patient taking differently: Place vaginally See admin instructions. Apply a pea-sized amount to vagina/urethra 2-3 times a week to prevent labial adhesions 06/23/15   Alexis Frock, MD  furosemide (LASIX) 40 MG tablet Take 120 mg by mouth daily. Take 3 tablets by mouth daily.    [provider]  Iron-FA-B Cmp-C-Biot-Probiotic (FUSION PLUS) CAPS Take 1 capsule by mouth every 7 (seven) days.  03/26/18   [provider]  levothyroxine (SYNTHROID, LEVOTHROID) 50 MCG tablet Take 50 mcg by mouth daily before breakfast.  05/13/18   [provider]  Magnesium 100 MG CAPS Take 100 mg by mouth 2 (two) times daily.     [provider]  metoprolol tartrate (LOPRESSOR) 25 MG tablet Take 3 tablets (75 mg total) by mouth 2 (two) times daily. 01/09/19   Jettie Booze, MD   potassium chloride SA (K-DUR,KLOR-CON) 20 MEQ tablet Take 20 mEq by mouth 3 (three) times a week.     [provider]  predniSONE (DELTASONE) 20 MG tablet Take 2 tablets (40 mg total) by mouth daily with breakfast. For the next four days 01/10/19   Carmin Muskrat, MD  rosuvastatin (CRESTOR) 10 MG tablet Take 10 mg by mouth at bedtime.  09/14/16   [provider]  tiotropium (SPIRIVA) 18 MCG inhalation capsule Place 1 capsule (18 mcg total) into inhaler and inhale daily. 07/10/18   Martyn Ehrich, NP  Turmeric 500 MG CAPS Take 500 mg by mouth 2 (two) times daily.    [provider]  warfarin (COUMADIN) 3 MG tablet Take 1.5-3 mg by mouth See admin instructions. Take 1.5 mg by mouth in the evening on Sun/Wed and 3 mg on Mon/Tues/Thurs/Fri/Sat    [provider]    Family History Family History  Problem Relation Age of Onset  . Breast cancer Mother   . Brain cancer Father   . Breast cancer Other   . Brain cancer Other   . Stroke Maternal Aunt   . Hypertension Sister   . Heart attack Neg Hx     Social History Social History   Tobacco Use  . Smoking status: Never Smoker  . Smokeless tobacco: Never Used  Substance Use Topics  . Alcohol use: No  . Drug use: No     Allergies   Ace inhibitors, Albuterol, Albuterol sulfate, Oysters [shellfish allergy], and Sulfa antibiotics   Review of Systems Review of Systems  Constitutional: Negative for appetite change, chills and fever.  HENT: Negative for ear pain, rhinorrhea, sneezing and sore throat.   Eyes: Negative for photophobia and visual disturbance.  Respiratory: Negative for cough, chest tightness, shortness of breath and wheezing.   Cardiovascular: Negative for chest pain and palpitations.  Gastrointestinal: Negative for abdominal pain, blood in stool, constipation, diarrhea, nausea and vomiting.  Genitourinary: Negative for dysuria, hematuria and urgency.  Musculoskeletal: Negative for  myalgias.  Skin: Negative for rash.  Neurological: Negative for dizziness, weakness and light-headedness.     Physical Exam Updated Vital Signs BP (!) 158/72   Pulse 71   Temp 99.4 F (37.4 C) (Oral)   Resp (!) 28   Ht 5' (1.524 m)   Wt 93 kg   LMP  (LMP Unknown)   SpO2 98%   BMI 40.04 kg/m   Physical Exam Vitals signs and nursing note reviewed.  Constitutional:      General: She is not in acute distress.    Appearance: She is well-developed.     Comments: Speaking in complete sentences without difficulty.  HENT:     Head: Normocephalic and atraumatic.     Nose: Nose normal.  Eyes:     General: No scleral icterus.       Left eye: No discharge.     Conjunctiva/sclera: Conjunctivae normal.  Neck:     Musculoskeletal: Normal range of motion and neck supple.  Cardiovascular:     Rate and Rhythm: Normal rate and regular rhythm.     Heart sounds: Normal heart sounds. No murmur. No friction rub. No gallop.   Pulmonary:     Effort: Pulmonary effort is normal. No respiratory distress.     Breath sounds: Normal breath sounds.  Abdominal:     General: Bowel sounds are normal. There is no distension.     Palpations: Abdomen is soft.     Tenderness: There is no abdominal tenderness. There is no guarding.  Musculoskeletal: Normal range of motion.  Skin:    General: Skin is warm and dry.     Findings: No rash.  Neurological:     General: No focal deficit present.     Mental Status: She is alert and oriented to person, place, and time.     Cranial Nerves: No cranial nerve deficit.     Sensory: No sensory deficit.     Motor: No weakness or abnormal muscle tone.     Coordination: Coordination normal.      ED Treatments / Results  Labs (all labs ordered are listed, but only abnormal results are displayed) Labs Reviewed  BASIC METABOLIC PANEL  CBC WITH DIFFERENTIAL/PLATELET    EKG EKG Interpretation  Date/Time:  Saturday January 11 2019 16:50:52 EST Ventricular  Rate:  62 PR Interval:  QRS Duration: 109 QT Interval:  467 QTC Calculation: 475 R Axis:   7 Text Interpretation: Sinus rhythm Prolonged PR interval Consider anterior infarct Baseline wander in lead(s) II III aVF Confirmed by Quintella Reichert [phone]) on 01/11/2019 5:18:38 PM   Radiology Dg Chest Port 1 View  Result Date: 01/10/2019 CLINICAL DATA:  Near syncope.  Dizziness EXAM: PORTABLE CHEST 1 VIEW COMPARISON:  09/30/2016 FINDINGS: Left chest wall pacer device is noted with lead in the right atrial appendage and right ventricle. The heart size is normal. Aortic atherosclerosis. The lungs are clear. No pleural effusion or edema. IMPRESSION: 1. No acute cardiopulmonary abnormalities. Electronically Signed   By: Kerby Moors M.D.   On: 01/10/2019 11:49    Procedures Procedures (including critical care time)  Medications Ordered in ED Medications - No data to display   Initial Impression / Assessment and Plan / ED Course  I have reviewed the triage vital signs and the nursing notes.  Pertinent labs & imaging results that were available during my care of the patient were reviewed by me and considered in my medical decision making (see chart for details).        85 year old female with a history of hypertension currently on metoprolol presents to the ED for high blood pressure reading.  She was seen and evaluated yesterday with reassuring work-up for similar symptoms.  She woke up this morning and blood pressures were in the 863O systolic. She took a total of 100mg  of metoprolol today after she checked her BP.  States that she has no specific complaints but "feels strange."  Patient well-appearing on exam.  No deficits neurological exam noted.  Speaking complete sentences without difficulty.  Blood pressure improved here to 158/72.  Other vital signs are within normal limits. Will obtain baseline labwork and CT head to evaluate for acute cause of HTN. Care handed off to oncoming provider  pending final disposition.  Final Clinical Impressions(s) / ED Diagnoses   Final diagnoses:  Essential hypertension    ED Discharge Orders    None     Portions of this note were generated with Dragon dictation software. Dictation errors may occur despite best attempts at proofreading.    Delia Heady, PA-C 01/11/19 1738    Quintella Reichert, MD 01/12/19 [phone]

## 2019-01-11 NOTE — ED Triage Notes (Signed)
Pt reports high blood pressure readings today. She has been taking extra doses of BP meds per her PCP. She was seen at [facility] yesterday for lightheadedness. States she "feels bad"

## 2019-01-13 DIAGNOSIS — Z5189 Encounter for other specified aftercare: Secondary | ICD-10-CM | POA: Diagnosis not present

## 2019-01-14 ENCOUNTER — Ambulatory Visit (INDEPENDENT_AMBULATORY_CARE_PROVIDER_SITE_OTHER): Payer: Medicare Other | Admitting: Primary Care

## 2019-01-14 ENCOUNTER — Other Ambulatory Visit: Payer: Self-pay

## 2019-01-14 ENCOUNTER — Encounter: Payer: Self-pay | Admitting: Primary Care

## 2019-01-14 VITALS — BP 126/70 | HR 69 | Temp 97.4°F | Ht 60.0 in | Wt 206.2 lb

## 2019-01-14 DIAGNOSIS — J849 Interstitial pulmonary disease, unspecified: Secondary | ICD-10-CM

## 2019-01-14 DIAGNOSIS — R0602 Shortness of breath: Secondary | ICD-10-CM | POA: Diagnosis not present

## 2019-01-14 MED ORDER — ANORO ELLIPTA 62.5-25 MCG/INH IN AEPB: 1.0000 | INHALATION_SPRAY | Freq: Every day | RESPIRATORY_TRACT | 0 refills | Status: DC

## 2019-01-14 NOTE — Progress Notes (Signed)
_0  ID: Lynn Dunn, female    DOB: 1930/11/15, 85 y.o.   MRN: [id number]  Chief Complaint  Patient presents with  . Follow-up    pt c/o worsening sob, wheezing with any exertion Xseveral weeks.  Seen in ED X2 in the past week for weakness, sob.      Referring provider: Javier Glazier, MD  HPI: 85 year old female, never smoked. PMH significant for afib, CAD, HTN, sick sinus syndrome, CVA, COPD, OSA on CPAP, tracheomalacia. Patient of Dr. Lamonte Sakai, last seen by pulmonary NP on 07/10/18. Felt that risks outweigh benefits from stenting airway. Patient does not want to undergo surgery.  Previous [place] encounters: ROV 01/18/17 --this is a follow-up visit for 85 year old woman with a history of obstructive sleep apnea (on CPAP), sick sinus syndrome and CVA.  I met her to evaluate long-standing dyspnea.  A CT scan of her chest showed some involution of the posterior wall of her trachea that was concerning for possible bronchotracheal malacia.  This prompted a bronchoscopy that was done on 10/12 and which revealed some laryngeal edema and significant partially obstructing proximal tracheomalacia that extended down into the bilateral mainstem airways.  Distal to those airways the lobar and segmental bronchi were widely patent.  There were no endobronchial lesions or abnormal secretions.  Pulmonary function testing was done on 01/18/17 that I have personally reviewed.  This shows evidence for mixed restriction and obstruction without a significant bronchodilator response.  Her lung volumes are normal and her diffusion capacity is decreased but corrects to the normal range when adjusted for alveolar volume.  ROV 08/28/17 --patient is an 85 year old woman who follows up today for her history of obstructive lung disease.  Her evaluation including bronchoscopy has revealed significant bronchotracheal malacia.  Her distal airways are widely patent.  She has never really responded to bronchodilators which  suggest that her obstruction is mainly large airway in nature.  She has a history of obstructive sleep apnea and uses CPAP. She saw Dr Kerin Ransom at [facility] to discuss possible stenting, ultimately decided that she would not pursue.   07/10/2018 Patient presents today for 1 year follow-up. Complains of worsening shortness of breath. Reports some improvement with Spiriva. Occasional dry cough, rare wheezing. Reports leg swelling. Currently taking lasix 75m daily; states that she was previously taking 80-1251mdaily but tapered down d/t dehydration symptoms. States that she does not feel like it's related to her tracheomalacia. Long standing history of dyspnea. Gained over 50lb the last few yearss. Current weight is 219 lbs. CXR showed cardiomegaly, she had an apt with cardiology but it was rescheduled to August d/t COVID restrictions.   PFTs showed evidence for mixed restriction and obstruction without a significant bronchodilator response.  Her lung volumes are normal and her diffusion capacity is decreased but corrects to the normal range when adjusted for alveolar volume.  01/14/2019 Patient presents today for 6 month follow-up. Seen in ED twice with hypertension, syncope and shortness of breath. CXR showed clear lungs with no evidence of cardiopulmonary abnormality. States that she get out of breath with minimal exertion. Associated tachycardia. Not wearing supplemental oxygen. States that it does not help with her shortness of breath. She will occasionally wear oxygen when she gets up and walks the halls. Tried Spiriva with no improvement. Ordered it from [facility]/t cost. Currently taking 12078mf lasix. She has chronic LE edema. Shortness of breath largely felt to be multifactorial and d/t tracheomalacia. She had a normal cardiac  CT. Echocardiogram showed normal LV and valvular function. Mild aortic stenosis. Pro-BNP normal in August. Amiodarone decreased by cardiology (goal wean off amiodarone completely).  Remains anticoagulated with coumadin.   Allergies  Allergen Reactions  . Ace Inhibitors Cough  . Albuterol Palpitations and Other (See Comments)    Atrial fibrillation   . Albuterol Sulfate Palpitations and Other (See Comments)    Causes A-Fib  . Oysters [Shellfish Allergy] Nausea And Vomiting  . Sulfa Antibiotics Rash    Immunization History  Administered Date(s) Administered  . Influenza Whole 12/07/2008  . Influenza, High Dose Seasonal PF 11/29/2016, 11/21/2017, 12/24/2018  . Influenza-Unspecified 12/02/2014  . Pneumococcal Polysaccharide-23 01/06/2008, 10/05/2017  . Zoster Recombinat (Shingrix) 12/31/2017, 04/01/2018    Past Medical History:  Diagnosis Date  . Airway malacia   . Anemia   . Anginal pain ([place]) 2011   had to take nitro   . Arthritis   . COPD (chronic obstructive pulmonary disease) ([place])   . Dysuria   . GERD (gastroesophageal reflux disease)   . Hematuria   . HTN (hypertension)   . Hx of echocardiogram    Echo (12/15):  Mild LVH, EF 60-65%, mild MR, mod LAE (LA 49 mm), PASP 43 mmHg  . Obesity   . OSA (obstructive sleep apnea)    cpap at home  . PAF (paroxysmal atrial fibrillation) ([place])    s/p ablation 07/29/09  . Sick sinus syndrome (H[facility])    pauses with syncope  . Stroke ([place]) 6/09   NO RESIDUAL PROBLEMS    Tobacco History: Social History   Tobacco Use  Smoking Status Never Smoker  Smokeless Tobacco Never Used   Counseling given: Not Answered   Outpatient Medications Prior to Visit  Medication Sig Dispense Refill  . amiodarone (PACERONE) 200 MG tablet Take 0.5 tablets (100 mg total) by mouth daily. (Patient taking differently: Take 100 mg by mouth daily. Take one half tablet by mouth daily.) 30 tablet 0  . calcitRIOL (ROCALTROL) 0.25 MCG capsule Take 0.25 mcg by mouth at bedtime.     . Coenzyme Q10 (COQ10 PO) Take 1 capsule by mouth at bedtime.     Marland Kitchen estradiol (ESTRACE VAGINAL) 0.1 MG/GM vaginal cream Apply pea-sized amount to vagina /  urethra 2-3 x weekly to prevent labial adhesions (Patient taking differently: Place vaginally See admin instructions. Apply a pea-sized amount to vagina/urethra 2-3 times a week to prevent labial adhesions) 42.5 g 12  . furosemide (LASIX) 40 MG tablet Take 120 mg by mouth daily. Take 3 tablets by mouth daily.    Marland Kitchen levothyroxine (SYNTHROID, LEVOTHROID) 50 MCG tablet Take 50 mcg by mouth daily before breakfast.     . Magnesium 100 MG CAPS Take 100 mg by mouth 2 (two) times daily.     . metoprolol tartrate (LOPRESSOR) 25 MG tablet Take 3 tablets (75 mg total) by mouth 2 (two) times daily. 180 tablet 11  . potassium chloride SA (K-DUR,KLOR-CON) 20 MEQ tablet Take 20 mEq by mouth 3 (three) times a week.     . rosuvastatin (CRESTOR) 10 MG tablet Take 10 mg by mouth at bedtime.     . Turmeric 500 MG CAPS Take 500 mg by mouth 2 (two) times daily.    Marland Kitchen warfarin (COUMADIN) 3 MG tablet Take 1.5-3 mg by mouth See admin instructions. Take 1.5 mg by mouth in the evening on Sun/Wed and 3 mg on Mon/Tues/Thurs/Fri/Sat    . tiotropium (SPIRIVA) 18 MCG inhalation capsule Place 1 capsule (18 mcg  total) into inhaler and inhale daily. 90 capsule 3  . predniSONE (DELTASONE) 20 MG tablet Take 2 tablets (40 mg total) by mouth daily with breakfast. For the next four days (Patient not taking: Reported on 01/14/2019) 8 tablet 0  . Iron-FA-B Cmp-C-Biot-Probiotic (FUSION PLUS) CAPS Take 1 capsule by mouth every 7 (seven) days.      No facility-administered medications prior to visit.    Review of Systems  Review of Systems  Constitutional: Positive for fatigue.  Respiratory: Positive for shortness of breath. Negative for wheezing.   Cardiovascular: Positive for leg swelling.   Physical Exam  BP 126/70 (BP Location: Left Leg, Cuff Size: Normal)   Pulse 69   Temp (!) 97.4 F (36.3 C) (Temporal)   Ht 5' (1.524 m)   Wt 206 lb 3.2 oz (93.5 kg)   LMP  (LMP Unknown)   SpO2 96%   BMI 40.27 kg/m  Physical Exam  Constitutional:      General: She is not in acute distress.    Appearance: Normal appearance. She is not ill-appearing.  HENT:     Head: Normocephalic and atraumatic.  Cardiovascular:     Rate and Rhythm: Normal rate and regular rhythm.     Heart sounds: Murmur present.     Comments: + chronic LE edema  Pulmonary:     Effort: Pulmonary effort is normal.     Breath sounds: Rales present. No wheezing or rhonchi.  Musculoskeletal: Normal range of motion.  Skin:    General: Skin is warm and dry.     Comments: Scattered bruising   Neurological:     General: No focal deficit present.     Mental Status: She is alert and oriented to person, place, and time. Mental status is at baseline.  Psychiatric:        Thought Content: Thought content normal.        Judgment: Judgment normal.      Lab Results:  CBC    Component Value Date/Time   WBC 8.3 01/11/2019 1746   RBC 3.75 (L) 01/11/2019 1746   HGB 12.2 01/11/2019 1746   HCT 38.2 01/11/2019 1746   PLT 230 01/11/2019 1746   MCV 101.9 (H) 01/11/2019 1746   MCH 32.5 01/11/2019 1746   MCHC 31.9 01/11/2019 1746   RDW 15.1 01/11/2019 1746   LYMPHSABS 1.7 01/11/2019 1746   MONOABS 0.7 01/11/2019 1746   EOSABS 0.0 01/11/2019 1746   BASOSABS 0.0 01/11/2019 1746    BMET    Component Value Date/Time   NA 138 01/11/2019 1746   NA 143 11/01/2018 1547   K 3.3 (L) 01/11/2019 1746   CL 95 (L) 01/11/2019 1746   CO2 30 01/11/2019 1746   GLUCOSE 125 (H) 01/11/2019 1746   BUN 39 (H) 01/11/2019 1746   BUN 25 11/01/2018 1547   CREATININE 1.72 (H) 01/11/2019 1746   CREATININE 1.08 (H) 11/25/2015 1125   CALCIUM 9.1 01/11/2019 1746   GFRNONAA 27 (L) 01/11/2019 1746   GFRAA 31 (L) 01/11/2019 1746    BNP    Component Value Date/Time   BNP 171.0 (H) 01/10/2019 1145   BNP 99.5 11/25/2015 1125    ProBNP    Component Value Date/Time   PROBNP 595 11/01/2018 1547   PROBNP 340.0 (H) 07/10/2018 1528    Imaging: Ct Head Wo Contrast   Result Date: 01/11/2019 CLINICAL DATA:  Hypertension, dizziness EXAM: CT HEAD WITHOUT CONTRAST TECHNIQUE: Contiguous axial images were obtained from the base of the  skull through the vertex without intravenous contrast. COMPARISON:  None. FINDINGS: Brain: No evidence of acute infarction, hemorrhage, hydrocephalus, extra-axial collection or mass lesion/mass effect. Extensive periventricular and deep white matter hypodensity. Vascular: No hyperdense vessel or unexpected calcification. Skull: Normal. Negative for fracture or focal lesion. Sinuses/Orbits: No acute finding. Other: None. IMPRESSION: No acute intracranial pathology. Advanced small-vessel white matter disease. Electronically Signed   By: Eddie Candle M.D.   On: 01/11/2019 18:17   Dg Chest Port 1 View  Result Date: 01/10/2019 CLINICAL DATA:  Near syncope.  Dizziness EXAM: PORTABLE CHEST 1 VIEW COMPARISON:  09/30/2016 FINDINGS: Left chest wall pacer device is noted with lead in the right atrial appendage and right ventricle. The heart size is normal. Aortic atherosclerosis. The lungs are clear. No pleural effusion or edema. IMPRESSION: 1. No acute cardiopulmonary abnormalities. Electronically Signed   By: Kerby Moors M.D.   On: 01/10/2019 11:49     Assessment & Plan:   SHORTNESS OF BREATH (SOB) - Shortness of breath largely felt to be multifactorial and d/t tracheomalacia - Echocardiogram showed mild aortic stenosis - Amiodarone decreased by cardiology (goal wean off amiodarone completely) - Checking HRCT d/t amiodarone use and decreased diffusion capacity on PFTs - If shortness of breath with no clear cause, consider cardiopulmonary exercise test  COPD (chronic obstructive pulmonary disease) ([place]) - Never smoked, PFTs showed evidence of mixed restriction and obstruction without BD response - Obstruction felt to be d/t large airway in nature and stenting is not recommended  - Repeat Spirometry prior to next visit - She has tried  Spiriva in the past with mild symptomatic improvement  - Trial Anoro 1 puff daily  - FU in 4-6 weeks    Martyn Ehrich, NP 01/15/2019

## 2019-01-14 NOTE — Patient Instructions (Addendum)
  Recommendations: Trial Anoro- take 1 puff daily  (This has a long acting bronchodilator and anticholinergic similar to Spiriva)  Orders: Spirometry prior to next visit  Labs today   Orders: HRCT re: r/o ILD   Follow-up: 4-6 weeks with Dr. Lamonte Sakai

## 2019-01-15 ENCOUNTER — Encounter: Payer: Medicare Other | Admitting: *Deleted

## 2019-01-15 ENCOUNTER — Ambulatory Visit (HOSPITAL_COMMUNITY)
Admission: RE | Admit: 2019-01-15 | Discharge: 2019-01-15 | Disposition: A | Discharge status: Home / Self Care | Payer: Medicare Other | Source: Ambulatory Visit | Attending: Nurse Practitioner | Admitting: Nurse Practitioner

## 2019-01-15 ENCOUNTER — Encounter (HOSPITAL_COMMUNITY): Payer: Self-pay | Admitting: Nurse Practitioner

## 2019-01-15 ENCOUNTER — Other Ambulatory Visit: Payer: Self-pay

## 2019-01-15 ENCOUNTER — Encounter: Payer: Self-pay | Admitting: Primary Care

## 2019-01-15 VITALS — BP 120/60 | HR 69 | Ht 60.0 in | Wt 207.2 lb

## 2019-01-15 DIAGNOSIS — I2510 Atherosclerotic heart disease of native coronary artery without angina pectoris: Secondary | ICD-10-CM | POA: Diagnosis not present

## 2019-01-15 DIAGNOSIS — D6869 Other thrombophilia: Secondary | ICD-10-CM

## 2019-01-15 DIAGNOSIS — F329 Major depressive disorder, single episode, unspecified: Secondary | ICD-10-CM | POA: Diagnosis not present

## 2019-01-15 DIAGNOSIS — I495 Sick sinus syndrome: Secondary | ICD-10-CM | POA: Diagnosis not present

## 2019-01-15 DIAGNOSIS — I10 Essential (primary) hypertension: Secondary | ICD-10-CM | POA: Insufficient documentation

## 2019-01-15 DIAGNOSIS — M25561 Pain in right knee: Secondary | ICD-10-CM | POA: Diagnosis not present

## 2019-01-15 DIAGNOSIS — G4733 Obstructive sleep apnea (adult) (pediatric): Secondary | ICD-10-CM | POA: Diagnosis not present

## 2019-01-15 DIAGNOSIS — I480 Paroxysmal atrial fibrillation: Secondary | ICD-10-CM | POA: Diagnosis not present

## 2019-01-15 DIAGNOSIS — D649 Anemia, unspecified: Secondary | ICD-10-CM | POA: Diagnosis not present

## 2019-01-15 DIAGNOSIS — R0602 Shortness of breath: Secondary | ICD-10-CM | POA: Diagnosis not present

## 2019-01-15 DIAGNOSIS — I4891 Unspecified atrial fibrillation: Secondary | ICD-10-CM | POA: Insufficient documentation

## 2019-01-15 DIAGNOSIS — T502X5A Adverse effect of carbonic-anhydrase inhibitors, benzothiadiazides and other diuretics, initial encounter: Secondary | ICD-10-CM | POA: Diagnosis not present

## 2019-01-15 DIAGNOSIS — Z950 Presence of cardiac pacemaker: Secondary | ICD-10-CM | POA: Diagnosis not present

## 2019-01-15 DIAGNOSIS — F419 Anxiety disorder, unspecified: Secondary | ICD-10-CM | POA: Diagnosis not present

## 2019-01-15 DIAGNOSIS — J449 Chronic obstructive pulmonary disease, unspecified: Secondary | ICD-10-CM | POA: Insufficient documentation

## 2019-01-15 DIAGNOSIS — E876 Hypokalemia: Secondary | ICD-10-CM | POA: Diagnosis not present

## 2019-01-15 LAB — COMPREHENSIVE METABOLIC PANEL
ALT: 16 U/L (ref 0–44)
AST: 19 U/L (ref 15–41)
Albumin: 4 g/dL (ref 3.5–5.0)
Alkaline Phosphatase: 70 U/L (ref 38–126)
Anion gap: 12 (ref 5–15)
BUN: 30 mg/dL — ABNORMAL HIGH (ref 8–23)
CO2: 28 mmol/L (ref 22–32)
Calcium: 9.3 mg/dL (ref 8.9–10.3)
Chloride: 98 mmol/L (ref 98–111)
Creatinine, Ser: 1.6 mg/dL — ABNORMAL HIGH (ref 0.44–1.00)
GFR calc Af Amer: 34 mL/min — ABNORMAL LOW (ref >60)
GFR calc non Af Amer: 29 mL/min — ABNORMAL LOW (ref >60)
Glucose, Bld: 122 mg/dL — ABNORMAL HIGH (ref 70–99)
Potassium: 4.2 mmol/L (ref 3.5–5.1)
Sodium: 138 mmol/L (ref 135–145)
Total Bilirubin: 1.1 mg/dL (ref 0.3–1.2)
Total Protein: 6.6 g/dL (ref 6.5–8.1)

## 2019-01-15 LAB — TSH: TSH: 2.208 u[IU]/mL (ref 0.350–4.500)

## 2019-01-15 NOTE — Progress Notes (Signed)
Primary Care Physician: Javier Glazier, MD Referring Physician: Dr. Karene Fry is a 85 y.o. female with a h/o CAD, HTN, afib, SSS s/p PPM, in the afib clinic for f/u. When she saw Dr. Ky Barban in May, he suggested  that she stop amiodarone since her Afib was well controlled and pt overall was  c/o of feeling poorly. She has chronic shortness of breath 2/2 tracheomalacia as well as other multifactorial reasons.   She states that she was off amiodarone x one week and started having  episodes of afib. She restarted drug. She did not feel improved off amiodarone. She is pending Chest CT 11/17. She continues on warfarin for a CHA2DS2VASc score of at least 8.  Today, she denies symptoms of palpitations, chest pain, shortness of breath, orthopnea, PND, lower extremity edema, dizziness, presyncope, syncope, or neurologic sequela. The patient is tolerating medications without difficulties and is otherwise without complaint today.   Past Medical History:  Diagnosis Date  . Airway malacia   . Anemia   . Anginal pain ([place]) 2011   had to take nitro   . Arthritis   . COPD (chronic obstructive pulmonary disease) ([place])   . Dysuria   . GERD (gastroesophageal reflux disease)   . Hematuria   . HTN (hypertension)   . Hx of echocardiogram    Echo (12/15):  Mild LVH, EF 60-65%, mild MR, mod LAE (LA 49 mm), PASP 43 mmHg  . Obesity   . OSA (obstructive sleep apnea)    cpap at home  . PAF (paroxysmal atrial fibrillation) ([place])    s/p ablation 07/29/09  . Sick sinus syndrome (HCC)    pauses with syncope  . Stroke ([place]) 6/09   NO RESIDUAL PROBLEMS   Past Surgical History:  Procedure Laterality Date  . ABDOMINAL HYSTERECTOMY    . ATRIAL ABLATION SURGERY     s/p ablation for afib 07/29/09  . CYSTOSCOPY N/A 06/23/2015   Procedure: CYSTOSCOPY;  Surgeon: Alexis Frock, MD;  [facility]: [facility];  Service: Urology;  Laterality: N/A;  . EP IMPLANTABLE DEVICE N/A 02/17/2015   MDT Advisa MRI PPM  implanted by Dr Lovena Le for sick sinus and syncope  . EP IMPLANTABLE DEVICE N/A 02/17/2015   Procedure: Loop Recorder Removal;  Surgeon: Evans Lance, MD;  Location: [place] CV LAB;  Service: Cardiovascular;  Laterality: N/A;  . ESOPHAGOGASTRODUODENOSCOPY  01/24/2012   Procedure: ESOPHAGOGASTRODUODENOSCOPY (EGD);  Surgeon: Jeryl Columbia, MD;  Location: [facility] ENDOSCOPY;  Service: Endoscopy;  Laterality: N/A;  . KNEE ARTHROSCOPY     BILATERAL  . LOOP RECORDER IMPLANT N/A 03/12/2014   Procedure: LOOP RECORDER IMPLANT;  Surgeon: Thompson Grayer, MD;  Location: [facility] CATH LAB;  Service: Cardiovascular;  Laterality: N/A;  . LYSIS OF ADHESION N/A 06/23/2015   Procedure: LYSIS OF LABIAL ADHESION;  Surgeon: Alexis Frock, MD;  [facility];  Service: Urology;  Laterality: N/A;  . VIDEO BRONCHOSCOPY Bilateral 12/15/2016   Procedure: VIDEO BRONCHOSCOPY WITHOUT FLUORO;  Surgeon: Collene Gobble, MD;  [facility];  Service: Cardiopulmonary;  Laterality: Bilateral;    Current Outpatient Medications  Medication Sig Dispense Refill  . amiodarone (PACERONE) 200 MG tablet Take 0.5 tablets (100 mg total) by mouth daily. (Patient taking differently: Take 100 mg by mouth daily. Take one half tablet by mouth daily.) 30 tablet 0  . calcitRIOL (ROCALTROL) 0.25 MCG capsule Take 0.25 mcg by mouth at bedtime.     . Coenzyme Q10 (COQ10 PO)  Take 1 capsule by mouth at bedtime.     Marland Kitchen estradiol (ESTRACE VAGINAL) 0.1 MG/GM vaginal cream Apply pea-sized amount to vagina / urethra 2-3 x weekly to prevent labial adhesions (Patient taking differently: Place vaginally See admin instructions. Apply a pea-sized amount to vagina/urethra 2-3 times a week to prevent labial adhesions) 42.5 g 12  . furosemide (LASIX) 40 MG tablet Take 120 mg by mouth daily. Take 3 tablets by mouth daily.    Marland Kitchen levothyroxine (SYNTHROID, LEVOTHROID) 50 MCG tablet Take 50 mcg by mouth daily before breakfast.     . Magnesium 100 MG CAPS Take 100 mg by  mouth 2 (two) times daily.     . metoprolol tartrate (LOPRESSOR) 25 MG tablet Take 3 tablets (75 mg total) by mouth 2 (two) times daily. 180 tablet 11  . potassium chloride SA (K-DUR,KLOR-CON) 20 MEQ tablet Take 20 mEq by mouth 3 (three) times a week.     . predniSONE (DELTASONE) 20 MG tablet Take 2 tablets (40 mg total) by mouth daily with breakfast. For the next four days 8 tablet 0  . rosuvastatin (CRESTOR) 10 MG tablet Take 10 mg by mouth at bedtime.     . Turmeric 500 MG CAPS Take 500 mg by mouth 2 (two) times daily.    Marland Kitchen umeclidinium-vilanterol (ANORO ELLIPTA) 62.5-25 MCG/INH AEPB Inhale 1 puff into the lungs daily. 1 each 0  . warfarin (COUMADIN) 3 MG tablet Take 1.5-3 mg by mouth See admin instructions. Take 1.5 mg by mouth in the evening on Sun/Wed and 3 mg on Mon/Tues/Thurs/Fri/Sat     No current facility-administered medications for this encounter.     Allergies  Allergen Reactions  . Ace Inhibitors Cough  . Albuterol Palpitations and Other (See Comments)    Atrial fibrillation   . Albuterol Sulfate Palpitations and Other (See Comments)    Causes A-Fib  . Oysters [Shellfish Allergy] Nausea And Vomiting  . Sulfa Antibiotics Rash    Social History   Socioeconomic History  . Marital status: Widowed    Spouse name: Not on file  . Number of children: Not on file  . Years of education: Not on file  . Highest education level: Not on file  Occupational History  . Not on file  Social Needs  . Financial resource strain: Not on file  . Food insecurity    Worry: Not on file    Inability: Not on file  . Transportation needs    Medical: Not on file    Non-medical: Not on file  Tobacco Use  . Smoking status: Never Smoker  . Smokeless tobacco: Never Used  Substance and Sexual Activity  . Alcohol use: Yes    Alcohol/week: 2.0 standard drinks    Types: 2 Glasses of wine per week    Comment: occassionally  . Drug use: No  . Sexual activity: Not on file  Lifestyle  .  Physical activity    Days per week: Not on file    Minutes per session: Not on file  . Stress: Not on file  Relationships  . Social Herbalist on phone: Not on file    Gets together: Not on file    Attends religious service: Not on file    Active member of club or organization: Not on file    Attends meetings of clubs or organizations: Not on file    Relationship status: Not on file  . Intimate partner violence    Fear of  current or ex partner: Not on file    Emotionally abused: Not on file    Physically abused: Not on file    Forced sexual activity: Not on file  Other Topics Concern  . Not on file  Social History Narrative  . Not on file    Family History  Problem Relation Age of Onset  . Breast cancer Mother   . Brain cancer Father   . Breast cancer Other   . Brain cancer Other   . Stroke Maternal Aunt   . Hypertension Sister   . Heart attack Neg Hx     ROS- All systems are reviewed and negative except as per the HPI above  Physical Exam: Vitals:   01/15/19 0937  BP: 120/60  Pulse: 69  Weight: 94 kg  Height: 5' (1.524 m)   Wt Readings from Last 3 Encounters:  01/15/19 94 kg  01/14/19 93.5 kg  01/11/19 93 kg    Labs: Lab Results  Component Value Date   NA 138 01/11/2019   K 3.3 (L) 01/11/2019   CL 95 (L) 01/11/2019   CO2 30 01/11/2019   GLUCOSE 125 (H) 01/11/2019   BUN 39 (H) 01/11/2019   CREATININE 1.72 (H) 01/11/2019   CALCIUM 9.1 01/11/2019   PHOS 4.3 09/05/2007   MG 2.4 01/10/2019   Lab Results  Component Value Date   INR 1.5 (H) 01/10/2019   Lab Results  Component Value Date   CHOL 170 08/31/2014   HDL 51.80 08/31/2014   LDLCALC 86 08/31/2014   TRIG 162.0 (H) 08/31/2014     GEN- The patient is well appearing, alert and oriented x 3 today.   Head- normocephalic, atraumatic Eyes-  Sclera clear, conjunctiva pink Ears- hearing intact Oropharynx- clear Neck- supple, no JVP Lymph- no cervical lymphadenopathy Lungs- Clear to  ausculation bilaterally, normal work of breathing Heart- Regular rate and rhythm, no murmurs, rubs or gallops, PMI not laterally displaced GI- soft, NT, ND, + BS Extremities- no clubbing, cyanosis, or edema MS- no significant deformity or atrophy Skin- no rash or lesion Psych- euthymic mood, full affect Neuro- strength and sensation are intact  EKG-a paced rhythm with prolonged AV block, LAD, anterior infarct, age undetermined, pr nt 290 ms, qrs int 98 ms, qtc 460 ms    Assessment and Plan: 1. Afib Staying in SR on amiodarone 100 mg daily She tried stopping it in May but had some afib so went back on it She can try taking 100 mg qod or M- F if she wants to try reducing dose further She doesn't like taking amiodarone but she dislikes afib more Continue metoprolol 25 mg( 3 tabs ) bid  Cmet/tsh  2. CHA2DS2VASc score of at least 8 Continue wafarin  3. HTN Stable  4. Chronic shortness of breath Thought to be largely  multifactorial and from tracheomalacia  Per pulmonary, just evaluated yesterday  Stenting is not recommended Pending Chest CT 11/17  F/u with Ermalinda Barrios, PA 11/17 as scheduled F/u with Dr. Rayann Heman in 4 months    Geroge Baseman. Nyomie Ehrlich, [facility] [address], [place] [address] [phone]

## 2019-01-15 NOTE — Assessment & Plan Note (Addendum)
-   Never smoked, PFTs showed evidence of mixed restriction and obstruction without BD response - Obstruction felt to be d/t large airway in nature and stenting is not recommended  - Repeat Spirometry prior to next visit - She has tried Spiriva in the past with mild symptomatic improvement  - Trial Anoro 1 puff daily  - FU in 4-6 weeks

## 2019-01-15 NOTE — Assessment & Plan Note (Signed)
-   Shortness of breath largely felt to be multifactorial and d/t tracheomalacia - Echocardiogram showed mild aortic stenosis - Amiodarone decreased by cardiology (goal wean off amiodarone completely) - Checking HRCT d/t amiodarone use and decreased diffusion capacity on PFTs - If shortness of breath with no clear cause, consider cardiopulmonary exercise test

## 2019-01-16 ENCOUNTER — Telehealth: Payer: Self-pay

## 2019-01-16 NOTE — Telephone Encounter (Signed)
Spoke with patient to remind of missed remote transmission LL

## 2019-01-17 ENCOUNTER — Telehealth: Payer: Self-pay | Admitting: Emergency Medicine

## 2019-01-20 NOTE — Progress Notes (Signed)
Cardiology Office Note    Date:  01/21/2019   ID:  Jah[place], [place] [birth year], MRN [id number]  PCP:  Javier Glazier, MD  Cardiologist: Larae Grooms, MD EPS: Thompson Grayer, MD  No chief complaint on file.   History of Present Illness:  Lynn Dunn is a 85 y.o. female with history of PAF on Coumadin, CVA treated with TPA, pacemaker.   Last saw Dr. Irish Lack 10/2018 and was more short of breath.  2D echo showed normal LVEF greater than 65% impaired relaxation, severely dilated left atrium mild aortic stenosis CPX 05/28/2017 no significant heart failure component primary limitation is respiratory deconditioning.  Patient was seen in the A. fib clinic 01/15/2019.  She had tried to stop amiodarone back in May at Dr. Jackalyn Lombard recommendations but went into atrial fib and restarted it.  she was told she could try to reduce it to every other day.  She is also on Coumadin.  She has chronic shortness of breath largely multifactorial and due to tracheobronchomalacia followed by pulmonary at [facility] and Dr. Lamonte Sakai. Weight loss but not stenting recommended.  Chest CT scheduled for 01/21/2019.  Complains of chronic dyspnea on exertion with little activity. HR goes up as well. Metoprolol was increased to 75 mg bid by Dr. Irish Lack but says her BP bottomed out. BP went up 11/7 so she took metoprolol 100 mg and ended up in ED feeling poorly.  Past Medical History:  Diagnosis Date  . Airway malacia   . Anemia   . Anginal pain ([place]) 2011   had to take nitro   . Arthritis   . COPD (chronic obstructive pulmonary disease) ([place])   . Dysuria   . GERD (gastroesophageal reflux disease)   . Hematuria   . HTN (hypertension)   . Hx of echocardiogram    Echo (12/15):  Mild LVH, EF 60-65%, mild MR, mod LAE (LA 49 mm), PASP 43 mmHg  . Obesity   . OSA (obstructive sleep apnea)    cpap at home  . PAF (paroxysmal atrial fibrillation) ([place])    s/p ablation 07/29/09  . Sick sinus syndrome (HCC)    pauses  with syncope  . Stroke ([place]) 6/09   NO RESIDUAL PROBLEMS    Past Surgical History:  Procedure Laterality Date  . ABDOMINAL HYSTERECTOMY    . ATRIAL ABLATION SURGERY     s/p ablation for afib 07/29/09  . CYSTOSCOPY N/A 06/23/2015   Procedure: CYSTOSCOPY;  Surgeon: Alexis Frock, MD;  [facility];  Service: Urology;  Laterality: N/A;  . EP IMPLANTABLE DEVICE N/A 02/17/2015   MDT Advisa MRI PPM implanted by Dr Lovena Le for sick sinus and syncope  . EP IMPLANTABLE DEVICE N/A 02/17/2015   Procedure: Loop Recorder Removal;  Surgeon: Evans Lance, MD;  Location: [place] CV LAB;  Service: Cardiovascular;  Laterality: N/A;  . ESOPHAGOGASTRODUODENOSCOPY  01/24/2012   Procedure: ESOPHAGOGASTRODUODENOSCOPY (EGD);  Surgeon: Jeryl Columbia, MD;  Location: [facility] ENDOSCOPY;  Service: Endoscopy;  Laterality: N/A;  . KNEE ARTHROSCOPY     BILATERAL  . LOOP RECORDER IMPLANT N/A 03/12/2014   Procedure: LOOP RECORDER IMPLANT;  Surgeon: Thompson Grayer, MD;  Location: [facility] CATH LAB;  Service: Cardiovascular;  Laterality: N/A;  . LYSIS OF ADHESION N/A 06/23/2015   Procedure: LYSIS OF LABIAL ADHESION;  Surgeon: Alexis Frock, MD;  [facility];  Service: Urology;  Laterality: N/A;  . VIDEO BRONCHOSCOPY Bilateral 12/15/2016   Procedure: VIDEO BRONCHOSCOPY WITHOUT FLUORO;  Surgeon: Lamonte Sakai,  Rose Fillers, MD;  Location: [facility] ENDOSCOPY;  Service: Cardiopulmonary;  Laterality: Bilateral;    Current Medications: Current Meds  Medication Sig  . amiodarone (PACERONE) 200 MG tablet Take 0.5 tablets (100 mg total) by mouth daily.  . calcitRIOL (ROCALTROL) 0.25 MCG capsule Take 0.25 mcg by mouth at bedtime.   . Coenzyme Q10 (COQ10 PO) Take 1 capsule by mouth at bedtime.   Marland Kitchen estradiol (ESTRACE VAGINAL) 0.1 MG/GM vaginal cream Apply pea-sized amount to vagina / urethra 2-3 x weekly to prevent labial adhesions (Patient taking differently: Place vaginally See admin instructions. Apply a pea-sized amount to vagina/urethra 2-3  times a week to prevent labial adhesions)  . furosemide (LASIX) 40 MG tablet Take 120 mg by mouth daily. Take 3 tablets by mouth daily.  Marland Kitchen levothyroxine (SYNTHROID, LEVOTHROID) 50 MCG tablet Take 50 mcg by mouth daily before breakfast.   . Magnesium 100 MG CAPS Take 100 mg by mouth 2 (two) times daily.   . metoprolol tartrate (LOPRESSOR) 25 MG tablet Take 50 mg by mouth 2 (two) times daily.  . potassium chloride SA (K-DUR,KLOR-CON) 20 MEQ tablet Take 20 mEq by mouth 3 (three) times a week.   . rosuvastatin (CRESTOR) 10 MG tablet Take 10 mg by mouth at bedtime.   . Turmeric 500 MG CAPS Take 500 mg by mouth 2 (two) times daily.  Marland Kitchen warfarin (COUMADIN) 3 MG tablet Take 1.5-3 mg by mouth See admin instructions. Take 1.5 mg by mouth in the evening on Sun/Wed and 3 mg on Mon/Tues/Thurs/Fri/Sat     Allergies:   Ace inhibitors, Albuterol, Albuterol sulfate, Oysters [shellfish allergy], and Sulfa antibiotics   Social History   Socioeconomic History  . Marital status: Widowed    Spouse name: Not on file  . Number of children: Not on file  . Years of education: Not on file  . Highest education level: Not on file  Occupational History  . Not on file  Social Needs  . Financial resource strain: Not on file  . Food insecurity    Worry: Not on file    Inability: Not on file  . Transportation needs    Medical: Not on file    Non-medical: Not on file  Tobacco Use  . Smoking status: Never Smoker  . Smokeless tobacco: Never Used  Substance and Sexual Activity  . Alcohol use: Yes    Alcohol/week: 2.0 standard drinks    Types: 2 Glasses of wine per week    Comment: occassionally  . Drug use: No  . Sexual activity: Not on file  Lifestyle  . Physical activity    Days per week: Not on file    Minutes per session: Not on file  . Stress: Not on file  Relationships  . Social Herbalist on phone: Not on file    Gets together: Not on file    Attends religious service: Not on file     Active member of club or organization: Not on file    Attends meetings of clubs or organizations: Not on file    Relationship status: Not on file  Other Topics Concern  . Not on file  Social History Narrative  . Not on file     Family History:  The patient's family history includes Brain cancer in her father and another family member; Breast cancer in her mother and another family member; Hypertension in her sister; Stroke in her maternal aunt.   ROS:   Please see the  history of present illness.    ROS All other systems reviewed and are negative.   PHYSICAL EXAM:   VS:  BP (!) 142/72   Pulse 85   Ht 5' (1.524 m)   Wt 204 lb 1.9 oz (92.6 kg)   LMP  (LMP Unknown)   SpO2 95%   BMI 39.86 kg/m   Physical Exam  GEN: Well nourished, well developed, in no acute distress  Neck: no JVD, carotid bruits, or masses Cardiac:RRR; no murmurs, rubs, or gallops  Respiratory:  clear to auscultation bilaterally, normal work of breathing GI: soft, nontender, nondistended, + BS Ext: without cyanosis, clubbing, or edema, Good distal pulses bilaterally Neuro:  Alert and Oriented x 3 Psych: euthymic mood, full affect  Wt Readings from Last 3 Encounters:  01/21/19 204 lb 1.9 oz (92.6 kg)  01/15/19 207 lb 3.2 oz (94 kg)  01/14/19 206 lb 3.2 oz (93.5 kg)      Studies/Labs Reviewed:   EKG:  EKG is not ordered today.    Recent Labs: 11/01/2018: NT-Pro BNP 595 01/10/2019: B Natriuretic Peptide 171.0; Magnesium 2.4 01/11/2019: Hemoglobin 12.2; Platelets 230 01/15/2019: ALT 16; BUN 30; Creatinine, Ser 1.60; Potassium 4.2; Sodium 138; TSH 2.208   Lipid Panel    Component Value Date/Time   CHOL 170 08/31/2014 0941   TRIG 162.0 (H) 08/31/2014 0941   HDL 51.80 08/31/2014 0941   CHOLHDL 3 08/31/2014 0941   VLDL 32.4 08/31/2014 0941   LDLCALC 86 08/31/2014 0941    Additional studies/ records that were reviewed today include:   Echo 11/07/18 IMPRESSIONS      1. The left ventricle has  hyperdynamic systolic function, with an ejection fraction of >65%. The cavity size was normal. There is mildly increased left ventricular wall thickness. Left ventricular diastolic Doppler parameters are consistent with  impaired relaxation. Elevated left atrial and left ventricular end-diastolic pressures The E/e' is >15.  2. The right ventricle has normal systolic function. The cavity was normal. There is no increase in right ventricular wall thickness.  3. Left atrial size was severely dilated.  4. Right atrial size was moderately dilated.  5. The mitral valve is abnormal. Mild thickening of the mitral valve leaflet.  6. The tricuspid valve is grossly normal.  7. The aortic valve is tricuspid. Mild calcification of the aortic valve. Aortic valve regurgitation is trivial by color flow Doppler. Mild stenosis of the aortic valve.  8. The aorta is normal unless otherwise noted.   SUMMARY   LVEF >65%, mild LVH, normal wall motion, grade 1 DD, elevated LV Filling pressure, normal RV function, RV pacer wires, severe LAE, moderate RAE, trivial MR, mild TR, calcified aortic valve with mild stenosis - mean gradient 13 mmHg, AVA 1.9 cm2, RVSP 30 mmHG, normal IVC  FINDINGS  Left Ventricle: The left ventricle has hyperdynamic systolic function, with an ejection fraction of >65%. The cavity size was normal. There is mildly increased left ventricular wall thickness. Left ventricular diastolic Doppler parameters are consistent  with impaired relaxation. Elevated left atrial and left ventricular end-diastolic pressures The E/e' is >15.   Right Ventricle: The right ventricle has normal systolic function. The cavity was normal. There is no increase in right ventricular wall thickness. Pacing wire/catheter visualized in the right ventricle.   Left Atrium: Left atrial size was severely dilated.   Right Atrium: Right atrial size was moderately dilated. Right atrial pressure is estimated at 3 mmHg.    Interatrial Septum: No atrial level shunt detected  by color flow Doppler.   Pericardium: There is no evidence of pericardial effusion.   Mitral Valve: The mitral valve is abnormal. Mild thickening of the mitral valve leaflet. Mitral valve regurgitation is trivial by color flow Doppler.   Tricuspid Valve: The tricuspid valve is grossly normal. Tricuspid valve regurgitation is mild by color flow Doppler.   Aortic Valve: The aortic valve is tricuspid Mild calcification of the aortic valve. Aortic valve regurgitation is trivial by color flow Doppler. There is Mild stenosis of the aortic valve, with a calculated valve area of 2.11 cm.   Pulmonic Valve: The pulmonic valve was grossly normal. Pulmonic valve regurgitation is trivial by color flow Doppler.   Aorta: The aorta is normal unless otherwise noted.   Venous: The inferior vena cava is normal in size with greater than 50% respiratory variability.     +--------------+--------++ LEFT VENTRICLE         +----------------+---------++ +--------------+--------++  Cardiopulmonary exercise testing 3/2019Conclusion: The interpretation of this test is limited due to submaximal effort during the exercise. Based on available data, exercise testing with gas exchange demonstrates normal functional capacity when compared to age matched sedentary norms. Pre-exercise spirometry demonstrates mild restrictive properties, likely related to her body habitus, further contributing to her overall exercise intolerance.     Test, report and preliminary impression by:  Landis Martins, MS, ACSM-RCEP  05/28/2017 3:56 PM     ASSESSMENT:    1. Paroxysmal atrial fibrillation (HCC)   2. Tracheomalacia   3. Essential hypertension, benign   4. OSA (obstructive sleep apnea)   5. History of CVA (cerebrovascular accident)      PLAN:  In order of problems listed above:  Paroxysmal atrial fibrillation maintaining normal sinus rhythm on amiodarone followed by  Dr. Rayann Heman status post pacemaker liver functions and TSH normal 01/15/2019-did not tolerate increase in metoprolol to 75 mg twice daily for increased heart rate with activity.  Ended up in the emergency room twice and is doing better back on 50 mg twice daily.  Shortness of breath felt multifactorial but also due to tracheobronchomalacia followed at [facility] -patient with evidence that this is not causing her shortness of breath.  She had a CT scan done today by Dr. Lamonte Sakai.  Results are pending.  Wonder if her amiodarone is causing her problems.  Will schedule appointment with Dr. Rayann Heman to discuss any other possible antiarrhythmics.  Also offered to do a Lexiscan on her but she wanted to hold off until after she gets her CT results.  Essential hypertension blood pressure controlled.  OSA on CPAP followed by Dr. Radford Pax  History of CVA    Medication Adjustments/Labs and Tests Ordered: Current medicines are reviewed at length with the patient today.  Concerns regarding medicines are outlined above.  Medication changes, Labs and Tests ordered today are listed in the Patient Instructions below. There are no Patient Instructions on file for this visit.   Sumner Boast, PA-C  01/21/2019 12:50 PM    [facility] [place], [place], [place]  [address] Phone: [phone]; Fax: [phone]

## 2019-01-21 ENCOUNTER — Telehealth: Payer: Self-pay | Admitting: Primary Care

## 2019-01-21 ENCOUNTER — Ambulatory Visit (HOSPITAL_BASED_OUTPATIENT_CLINIC_OR_DEPARTMENT_OTHER)
Admission: RE | Admit: 2019-01-21 | Discharge: 2019-01-21 | Disposition: A | Discharge status: Home / Self Care | Payer: Medicare Other | Source: Ambulatory Visit | Attending: Primary Care | Admitting: Primary Care

## 2019-01-21 ENCOUNTER — Other Ambulatory Visit: Payer: Self-pay

## 2019-01-21 ENCOUNTER — Encounter: Payer: Self-pay | Admitting: Physician Assistant

## 2019-01-21 ENCOUNTER — Ambulatory Visit (INDEPENDENT_AMBULATORY_CARE_PROVIDER_SITE_OTHER): Payer: Medicare Other | Admitting: Physician Assistant

## 2019-01-21 VITALS — BP 142/72 | HR 85 | Ht 60.0 in | Wt 204.1 lb

## 2019-01-21 DIAGNOSIS — I10 Essential (primary) hypertension: Secondary | ICD-10-CM | POA: Diagnosis not present

## 2019-01-21 DIAGNOSIS — G4733 Obstructive sleep apnea (adult) (pediatric): Secondary | ICD-10-CM | POA: Diagnosis not present

## 2019-01-21 DIAGNOSIS — I6529 Occlusion and stenosis of unspecified carotid artery: Secondary | ICD-10-CM | POA: Diagnosis not present

## 2019-01-21 DIAGNOSIS — I480 Paroxysmal atrial fibrillation: Secondary | ICD-10-CM | POA: Diagnosis not present

## 2019-01-21 DIAGNOSIS — J849 Interstitial pulmonary disease, unspecified: Secondary | ICD-10-CM | POA: Diagnosis not present

## 2019-01-21 DIAGNOSIS — J398 Other specified diseases of upper respiratory tract: Secondary | ICD-10-CM

## 2019-01-21 DIAGNOSIS — Z8673 Personal history of transient ischemic attack (TIA), and cerebral infarction without residual deficits: Secondary | ICD-10-CM | POA: Diagnosis not present

## 2019-01-21 NOTE — Telephone Encounter (Signed)
Attempted to call pt but unable to reach. Left message for pt to return call. 

## 2019-01-21 NOTE — Patient Instructions (Signed)
Medication Instructions:  Your physician recommends that you continue on your current medications as directed. Please refer to the Current Medication list given to you today.  If you need a refill on your cardiac medications before your next appointment, please call your pharmacy.   Lab work: None Ordered  If you have labs (blood work) drawn today and your tests are completely normal, you will receive your results only by: Marland Kitchen MyChart Message (if you have MyChart) OR . A paper copy in the mail If you have any lab test that is abnormal or we need to change your treatment, we will call you to review the results.  Testing/Procedures: None ordered  Follow-Up: . Follow up with Dr. Rayann Heman via PHONE Visit on 02/03/19 at 10:45 AM  Any Other Special Instructions Will Be Listed Below (If Applicable).

## 2019-01-21 NOTE — Telephone Encounter (Signed)
Please let patient know HRCT showed no evidence of ILD. 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 This is causing her shortness of breath. Recommended home sleep study to see if she could qualify for CPAP which has been shown to help. Any history of snoring or excessive fatigue? Please do Epworth over the phone  Cc: Dr. Lamonte Sakai

## 2019-01-22 DIAGNOSIS — R799 Abnormal finding of blood chemistry, unspecified: Secondary | ICD-10-CM | POA: Diagnosis not present

## 2019-01-22 DIAGNOSIS — I779 Disorder of arteries and arterioles, unspecified: Secondary | ICD-10-CM | POA: Diagnosis not present

## 2019-01-22 DIAGNOSIS — R782 Finding of cocaine in blood: Secondary | ICD-10-CM | POA: Diagnosis not present

## 2019-01-22 DIAGNOSIS — Z0189 Encounter for other specified special examinations: Secondary | ICD-10-CM | POA: Diagnosis not present

## 2019-01-22 NOTE — Telephone Encounter (Signed)
Spoke with pt, I gave her the results from the CT scan. She understood and agreed to answer the Epworth questionaire She states she does have heavy snoring and excessive fatigue but she is already on a CPAP machine. She uses her CPAP machine every night. Her Epworth score is 18. She uses [employer] for her CPAP but not enrolled in [employer]. I called [employer] to see if we could retrieve a compliance report.and they downloaded her account to airview. I printed the report so Lynn Dunn can review. BW please advise.

## 2019-01-22 NOTE — Telephone Encounter (Signed)
OK, nothing further needed. Continue CPAP.  Download shows 100% compliance, pressure 15cm h20, AHI 4.2.

## 2019-01-22 NOTE — Telephone Encounter (Signed)
Left message for patient to call back  

## 2019-01-23 ENCOUNTER — Telehealth: Payer: Self-pay | Admitting: Emergency Medicine

## 2019-01-23 DIAGNOSIS — R0602 Shortness of breath: Secondary | ICD-10-CM | POA: Diagnosis not present

## 2019-01-23 DIAGNOSIS — F419 Anxiety disorder, unspecified: Secondary | ICD-10-CM | POA: Diagnosis not present

## 2019-01-23 MED ORDER — ANORO ELLIPTA 62.5-25 MCG/INH IN AEPB: 1.0000 | INHALATION_SPRAY | Freq: Every day | RESPIRATORY_TRACT | 0 refills | Status: DC

## 2019-01-23 NOTE — Telephone Encounter (Signed)
Spoke with Lynn Dunn, advised her that I would leave samples of Anoro up front. She understood and nothing further is needed.

## 2019-01-24 NOTE — Telephone Encounter (Signed)
No covid test was done. See pt is scheduled for OV with RB and not seeing any appt for spiro. Will close encounter.

## 2019-01-28 NOTE — Telephone Encounter (Signed)
LMOM TCB x2

## 2019-01-29 DIAGNOSIS — Z7901 Long term (current) use of anticoagulants: Secondary | ICD-10-CM | POA: Diagnosis not present

## 2019-01-29 DIAGNOSIS — I4891 Unspecified atrial fibrillation: Secondary | ICD-10-CM | POA: Diagnosis not present

## 2019-02-03 ENCOUNTER — Telehealth (INDEPENDENT_AMBULATORY_CARE_PROVIDER_SITE_OTHER): Payer: Medicare Other | Admitting: Internal Medicine

## 2019-02-03 ENCOUNTER — Encounter: Payer: Self-pay | Admitting: Internal Medicine

## 2019-02-03 ENCOUNTER — Ambulatory Visit (INDEPENDENT_AMBULATORY_CARE_PROVIDER_SITE_OTHER): Payer: Medicare Other | Admitting: *Deleted

## 2019-02-03 VITALS — BP 150/64 | HR 85 | Ht 60.0 in | Wt 200.0 lb

## 2019-02-03 DIAGNOSIS — I4819 Other persistent atrial fibrillation: Secondary | ICD-10-CM | POA: Diagnosis not present

## 2019-02-03 DIAGNOSIS — I495 Sick sinus syndrome: Secondary | ICD-10-CM

## 2019-02-03 DIAGNOSIS — J398 Other specified diseases of upper respiratory tract: Secondary | ICD-10-CM

## 2019-02-03 DIAGNOSIS — R0602 Shortness of breath: Secondary | ICD-10-CM

## 2019-02-03 DIAGNOSIS — I10 Essential (primary) hypertension: Secondary | ICD-10-CM

## 2019-02-03 DIAGNOSIS — Z950 Presence of cardiac pacemaker: Secondary | ICD-10-CM | POA: Diagnosis not present

## 2019-02-03 DIAGNOSIS — Z7901 Long term (current) use of anticoagulants: Secondary | ICD-10-CM | POA: Diagnosis not present

## 2019-02-03 LAB — CUP PACEART REMOTE DEVICE CHECK
Battery Remaining Longevity: 83 mo
Battery Voltage: 3.01 V
Brady Statistic AP VP Percent: 0.18 %
Brady Statistic AP VS Percent: 81.23 %
Brady Statistic AS VP Percent: 0.04 %
Brady Statistic AS VS Percent: 18.54 %
Brady Statistic RA Percent Paced: 81.39 %
Brady Statistic RV Percent Paced: 0.23 %
Date Time Interrogation Session: 20201130115647
Implantable Lead Implant Date: 20161214
Implantable Lead Implant Date: 20161214
Implantable Lead Location: 753859
Implantable Lead Location: 753860
Implantable Lead Model: 5076
Implantable Lead Model: 5076
Implantable Pulse Generator Implant Date: 20161214
Lead Channel Impedance Value: 418 Ohm
Lead Channel Impedance Value: 418 Ohm
Lead Channel Impedance Value: 456 Ohm
Lead Channel Impedance Value: 513 Ohm
Lead Channel Pacing Threshold Amplitude: 1.125 V
Lead Channel Pacing Threshold Amplitude: 1.625 V
Lead Channel Pacing Threshold Pulse Width: 0.4 ms
Lead Channel Pacing Threshold Pulse Width: 0.4 ms
Lead Channel Sensing Intrinsic Amplitude: 10.625 mV
Lead Channel Sensing Intrinsic Amplitude: 10.625 mV
Lead Channel Sensing Intrinsic Amplitude: 3.375 mV
Lead Channel Sensing Intrinsic Amplitude: 3.375 mV
Lead Channel Setting Pacing Amplitude: 2.5 V
Lead Channel Setting Pacing Amplitude: 3.25 V
Lead Channel Setting Pacing Pulse Width: 0.4 ms
Lead Channel Setting Sensing Sensitivity: 2.8 mV

## 2019-02-03 NOTE — Progress Notes (Signed)
Electrophysiology TeleHealth Note   Due to national recommendations of social distancing due to [place] 19, an audio telehealth visit is felt to be most appropriate for this patient at this time.  Verbal consent was obtained by me for the telehealth visit today.  The patient does not have capability for a virtual visit.  A phone visit is therefore required today.   Date:  02/03/2019   ID:  Lynn Dunn, DOB 02-Jun-1930, MRN [id number]  Location: patient's home  Provider location:  [facility]  Evaluation Performed: Follow-up visit  PCP:  Javier Glazier, MD   Electrophysiologist:  Dr Rayann Heman  Chief Complaint:  AF follow up  History of Present Illness:    Lynn Dunn is a 85 y.o. female who presents via telehealth conferencing today.  Since last being seen in our clinic, the patient reports doing relatively well.  She has chronic shortness of breath with exertion. Today, she denies symptoms of palpitations, chest pain, shortness of breath,  lower extremity edema, dizziness, presyncope, or syncope.  The patient is otherwise without complaint today.  The patient denies symptoms of fevers, chills, cough, or new SOB worrisome for COVID 19.  Past Medical History:  Diagnosis Date  . Airway malacia   . Anemia   . Anginal pain ([place]) 2011   had to take nitro   . Arthritis   . COPD (chronic obstructive pulmonary disease) ([place])   . Dysuria   . GERD (gastroesophageal reflux disease)   . Hematuria   . HTN (hypertension)   . Hx of echocardiogram    Echo (12/15):  Mild LVH, EF 60-65%, mild MR, mod LAE (LA 49 mm), PASP 43 mmHg  . Obesity   . OSA (obstructive sleep apnea)    cpap at home  . PAF (paroxysmal atrial fibrillation) ([place])    s/p ablation 07/29/09  . Sick sinus syndrome (HCC)    pauses with syncope  . Stroke ([place]) 6/09   NO RESIDUAL PROBLEMS    Past Surgical History:  Procedure Laterality Date  . ABDOMINAL HYSTERECTOMY    . ATRIAL ABLATION SURGERY     s/p  ablation for afib 07/29/09  . CYSTOSCOPY N/A 06/23/2015   Procedure: CYSTOSCOPY;  Surgeon: Alexis Frock, MD;  Location: [facility];  Service: Urology;  Laterality: N/A;  . EP IMPLANTABLE DEVICE N/A 02/17/2015   MDT Advisa MRI PPM implanted by Dr Lovena Le for sick sinus and syncope  . EP IMPLANTABLE DEVICE N/A 02/17/2015   Procedure: Loop Recorder Removal;  Surgeon: Evans Lance, MD;  Location: [place] [facility];  Service: Cardiovascular;  Laterality: N/A;  . ESOPHAGOGASTRODUODENOSCOPY  01/24/2012   Procedure: ESOPHAGOGASTRODUODENOSCOPY (EGD);  Surgeon: Jeryl Columbia, MD;  Location: [facility] ENDOSCOPY;  Service: Endoscopy;  Laterality: N/A;  . KNEE ARTHROSCOPY     BILATERAL  . LOOP RECORDER IMPLANT N/A 03/12/2014   Procedure: LOOP RECORDER IMPLANT;  Surgeon: Thompson Grayer, MD;  Location: [facility] CATH LAB;  Service: Cardiovascular;  Laterality: N/A;  . LYSIS OF ADHESION N/A 06/23/2015   Procedure: LYSIS OF LABIAL ADHESION;  Surgeon: Alexis Frock, MD;  Location: [facility];  Service: Urology;  Laterality: N/A;  . VIDEO BRONCHOSCOPY Bilateral 12/15/2016   Procedure: VIDEO BRONCHOSCOPY WITHOUT FLUORO;  Surgeon: Collene Gobble, MD;  Location: [facility] ENDOSCOPY;  Service: Cardiopulmonary;  Laterality: Bilateral;    Current Outpatient Medications  Medication Sig Dispense Refill  . amiodarone (PACERONE) 200 MG tablet Take 0.5 tablets (100 mg total) by mouth daily.  30 tablet 0  . calcitRIOL (ROCALTROL) 0.25 MCG capsule Take 0.25 mcg by mouth at bedtime.     . Coenzyme Q10 (COQ10 PO) Take 1 capsule by mouth at bedtime.     Marland Kitchen estradiol (ESTRACE VAGINAL) 0.1 MG/GM vaginal cream Apply pea-sized amount to vagina / urethra 2-3 x weekly to prevent labial adhesions (Patient taking differently: Place vaginally See admin instructions. Apply a pea-sized amount to vagina/urethra 2-3 times a week to prevent labial adhesions) 42.5 g 12  . furosemide (LASIX) 40 MG tablet Take 120 mg by mouth daily. Take 3 tablets by mouth daily.    Marland Kitchen  levothyroxine (SYNTHROID, LEVOTHROID) 50 MCG tablet Take 50 mcg by mouth daily before breakfast.     . Magnesium 100 MG CAPS Take 100 mg by mouth 2 (two) times daily.     . metoprolol tartrate (LOPRESSOR) 25 MG tablet Take 50 mg by mouth 2 (two) times daily.    . potassium chloride SA (K-DUR,KLOR-CON) 20 MEQ tablet Take 20 mEq by mouth 3 (three) times a week.     . rosuvastatin (CRESTOR) 10 MG tablet Take 10 mg by mouth at bedtime.     . Turmeric 500 MG CAPS Take 500 mg by mouth 2 (two) times daily.    Marland Kitchen umeclidinium-vilanterol (ANORO ELLIPTA) 62.5-25 MCG/INH AEPB Inhale 1 puff into the lungs daily. 2 each 0  . warfarin (COUMADIN) 3 MG tablet Take 1.5-3 mg by mouth See admin instructions. Take 1.5 mg by mouth in the evening on Sun/Wed and 3 mg on Mon/Tues/Thurs/Fri/Sat     No current facility-administered medications for this visit.     Allergies:   Ace inhibitors, Albuterol, Albuterol sulfate, Oysters [shellfish allergy], and Sulfa antibiotics   Social History:  The patient  reports that she has never smoked. She has never used smokeless tobacco. She reports current alcohol use of about 2.0 standard drinks of alcohol per week. She reports that she does not use drugs.   Family History:  The patient's  family history includes Brain cancer in her father and another family member; Breast cancer in her mother and another family member; Hypertension in her sister; Stroke in her maternal aunt.   ROS:  Please see the history of present illness.   All other systems are personally reviewed and negative.    Exam:    Vital Signs:  BP (!) 150/64   Pulse 85   Ht 5' (1.524 m)   Wt 200 lb (90.7 kg)   LMP  (LMP Unknown)   BMI 39.06 kg/m   Well sounding and appearing, alert and conversant, regular work of breathing,  good skin color Eyes- anicteric, neuro- grossly intact, skin- no apparent rash or lesions or cyanosis, mouth- oral mucosa is pink  Labs/Other Tests and Data Reviewed:    Recent Labs:  11/01/2018: NT-Pro BNP 595 01/10/2019: B Natriuretic Peptide 171.0; Magnesium 2.4 01/11/2019: Hemoglobin 12.2; Platelets 230 01/15/2019: ALT 16; BUN 30; Creatinine, Ser 1.60; Potassium 4.2; Sodium 138; TSH 2.208   Wt Readings from Last 3 Encounters:  02/03/19 200 lb (90.7 kg)  01/21/19 204 lb 1.9 oz (92.6 kg)  01/15/19 207 lb 3.2 oz (94 kg)     Last device remote is reviewed from [employer] which reveals normal device function, no arrhythmias     ASSESSMENT & PLAN:    1.  Persistent atrial fibrillation She missed last remote, asked her to send a remote today No recent AF documented Continue Warfarin for CHADS2VASC of 8 Continue  low dose amiodarone for now - if no AF recurrence by remote, would stop amiodarone  Have been adamant that we should stop amiodarone - she has been very reluctant to do so in the past  2.  HTN Stable No change required today  3.  Shortness of breath Chronic and multifactorial Felt to be from tracheomalacia - followed by pulmonary She has had no AF by remotes or EKG's   4.  Sick sinus syndrome Normal pacemaker function by remote in August Will evaluate remote after sent today She feels that her heart rate goes too fast when she gets up. Will evaluate histograms - will offer appointment to bring her in to re-adjust sensor.    Follow-up:  [facility], AF clinic in 1 year    Patient Risk:  after full review of this patients clinical status, I feel that they are at moderate risk at this time.  Today, I have spent 20 minutes with the patient with telehealth technology discussing arrhythmia management .    Army Fossa, MD  02/03/2019 10:43 AM     [facility] [address] [place] [address] [phone] (office) [phone] (fax)

## 2019-02-06 NOTE — Telephone Encounter (Signed)
Patient is aware of CPAP download results Will sign off

## 2019-02-11 ENCOUNTER — Telehealth: Payer: Self-pay

## 2019-02-11 NOTE — Telephone Encounter (Signed)
-----   Message from Damian Leavell, RN sent at 02/11/2019 12:59 PM EST ----- Regarding: looks like Dr. Rayann Heman wanted a remote onthis Pt?

## 2019-02-11 NOTE — Telephone Encounter (Signed)
I called the pt to let her know Dr. Rayann Heman wants a transmission with her home monitor. The pt agreed to send one today. I told her the nurse will review it when she see it and give her a call back.

## 2019-02-12 NOTE — Telephone Encounter (Signed)
Transmission was received 02/03/19 as originally requested. Repeat transmission reviewed. Normal device function, no new episodes.

## 2019-02-12 NOTE — Telephone Encounter (Signed)
Transmission received 02-11-2019.

## 2019-02-13 ENCOUNTER — Ambulatory Visit (INDEPENDENT_AMBULATORY_CARE_PROVIDER_SITE_OTHER): Payer: Medicare Other | Admitting: Emergency Medicine

## 2019-02-13 ENCOUNTER — Encounter: Payer: Self-pay | Admitting: Emergency Medicine

## 2019-02-13 ENCOUNTER — Other Ambulatory Visit: Payer: Self-pay

## 2019-02-13 DIAGNOSIS — J398 Other specified diseases of upper respiratory tract: Secondary | ICD-10-CM | POA: Diagnosis not present

## 2019-02-13 DIAGNOSIS — I6529 Occlusion and stenosis of unspecified carotid artery: Secondary | ICD-10-CM

## 2019-02-13 DIAGNOSIS — G4733 Obstructive sleep apnea (adult) (pediatric): Secondary | ICD-10-CM | POA: Diagnosis not present

## 2019-02-13 NOTE — Assessment & Plan Note (Signed)
Continue CPAP as she has been using it.

## 2019-02-13 NOTE — Progress Notes (Signed)
Subjective:    Patient ID: Lynn Dunn, female    DOB: [birth year], 85 y.o.   MRN: [id number]  HPI 85 year old obese woman never smoker with a history of hypertension, obstructive sleep apnea on CPAP, atrial fibrillation with sick sinus syndrome status post ablation, stroke. Apparently she has also been given diagnosis of obstructive lung disease at some point in the past and was given albuterol - didn't tolerate. She is referred today for evaluation of suspected tracheomalacia seen on cardiac CT scan 09/2016.   I reviewed the CT chest, appears to show some posterior wall involution of the trachea. Also note that the esophageal wall appears somewhat enlarged and thickened.  She tells me that she has had longstanding dyspnea. Seems to have worsened over the years. Worst with exertion. She also describes times when she can hear a snore even when she is siting up awake watching TV. She believes that she is awake when this happens. She was intubated for CVA about 10 yrs ago. She has gained wt, about 50 lbs over last 2 yrs.   Spirometry 2011 showed evidence for restriction with possible superimposed obstruction and a normal diffusion capacity.  ROV 01/18/17 --this is a follow-up visit for 85 year old woman with a history of obstructive sleep apnea (on CPAP), sick sinus syndrome and CVA.  I met her to evaluate long-standing dyspnea.  A CT scan of her chest showed some involution of the posterior wall of her trachea that was concerning for possible bronchotracheal malacia.  This prompted a bronchoscopy that was done on 10/12 and which revealed some laryngeal edema and significant partially obstructing proximal tracheomalacia that extended down into the bilateral mainstem airways.  Distal to those airways the lobar and segmental bronchi were widely patent.  There were no endobronchial lesions or abnormal secretions.  Pulmonary function testing was done on 01/18/17 that I have personally reviewed.  This  shows evidence for mixed restriction and obstruction without a significant bronchodilator response.  Her lung volumes are normal and her diffusion capacity is decreased but corrects to the normal range when adjusted for alveolar volume.  ROV 08/28/17 --patient is an 85 year old woman who follows up today for her history of obstructive lung disease.  Her evaluation including bronchoscopy has revealed significant bronchotracheal malacia.  Her distal airways are widely patent.  She has never really responded to bronchodilators which suggest that her obstruction is mainly large airway in nature.  She has a history of obstructive sleep apnea and uses CPAP. She saw Dr Kerin Ransom at [facility] to discuss possible stenting, ultimately decided that she would not pursue.   ROV 02/13/2019 --follow-up visit for 85 year old woman with a history of obstructive lung disease and bronchotracheal malacia that contributes to significant dyspnea.  She never really responded to bronchodilators.  She has a history of obstructive sleep apnea, uses CPAP.  She has been managed most recently on Anoro - didn't notice that it helped her very much at all.  Her other history includes hypertension which has been labile, she has been seen in the emergency department twice with hypertension and syncope, dyspnea. She has exercise tachycardia, dyspnea, has seen some desaturations.  She had a CT chest since last time that I reviewed > no evidence ILD, significant collapsibility of the large airways.    Review of Systems  Constitutional: Positive for unexpected weight change. Negative for fever.  HENT: Positive for congestion. Negative for dental problem, ear pain, nosebleeds, postnasal drip, rhinorrhea, sinus pressure, sneezing, sore throat and trouble  swallowing.   Eyes: Negative for redness and itching.  Respiratory: Positive for cough and shortness of breath. Negative for chest tightness and wheezing.   Cardiovascular: Positive for palpitations.  Negative for leg swelling.  Gastrointestinal: Negative for nausea and vomiting.  Genitourinary: Negative for dysuria.  Musculoskeletal: Negative for joint swelling.  Skin: Negative for rash.  Neurological: Negative for headaches.  Hematological: Does not bruise/bleed easily.  Psychiatric/Behavioral: Negative for dysphoric mood. The patient is not nervous/anxious.     Past Medical History:  Diagnosis Date  . Airway malacia   . Anemia   . Anginal pain ([place]) 2011   had to take nitro   . Arthritis   . COPD (chronic obstructive pulmonary disease) ([place])   . Dysuria   . GERD (gastroesophageal reflux disease)   . Hematuria   . HTN (hypertension)   . Hx of echocardiogram    Echo (12/15):  Mild LVH, EF 60-65%, mild MR, mod LAE (LA 49 mm), PASP 43 mmHg  . Obesity   . OSA (obstructive sleep apnea)    cpap at home  . PAF (paroxysmal atrial fibrillation) ([place])    s/p ablation 07/29/09  . Sick sinus syndrome (HCC)    pauses with syncope  . Stroke ([place]) 6/09   NO RESIDUAL PROBLEMS     Family History  Problem Relation Age of Onset  . Breast cancer Mother   . Brain cancer Father   . Breast cancer Other   . Brain cancer Other   . Stroke Maternal Aunt   . Hypertension Sister   . Heart attack Neg Hx      Social History   Socioeconomic History  . Marital status: Widowed    Spouse name: Not on file  . Number of children: Not on file  . Years of education: Not on file  . Highest education level: Not on file  Occupational History  . Not on file  Tobacco Use  . Smoking status: Never Smoker  . Smokeless tobacco: Never Used  Substance and Sexual Activity  . Alcohol use: Yes    Alcohol/week: 2.0 standard drinks    Types: 2 Glasses of wine per week    Comment: occassionally  . Drug use: No  . Sexual activity: Not on file  Other Topics Concern  . Not on file  Social History Narrative  . Not on file   Social Determinants of Health   Financial Resource Strain:   . Difficulty  of Paying Living Expenses: Not on file  Food Insecurity:   . Worried About Charity fundraiser in the Last Year: Not on file  . Ran Out of Food in the Last Year: Not on file  Transportation Needs:   . Lack of Transportation (Medical): Not on file  . Lack of Transportation (Non-Medical): Not on file  Physical Activity:   . Days of Exercise per Week: Not on file  . Minutes of Exercise per Session: Not on file  Stress:   . Feeling of Stress : Not on file  Social Connections:   . Frequency of Communication with Friends and Family: Not on file  . Frequency of Social Gatherings with Friends and Family: Not on file  . Attends Religious Services: Not on file  . Active Member of Clubs or Organizations: Not on file  . Attends Archivist Meetings: Not on file  . Marital Status: Not on file  Intimate Partner Violence:   . Fear of Current or Ex-Partner: Not on file  .  Emotionally Abused: Not on file  . Physically Abused: Not on file  . Sexually Abused: Not on file  grew up near [employer], [place]. Husband worked w asbestos.  Worked in Scientist, research (life sciences) estate  Allergies  Allergen Reactions  . Ace Inhibitors Cough  . Albuterol Palpitations and Other (See Comments)    Atrial fibrillation   . Albuterol Sulfate Palpitations and Other (See Comments)    Causes A-Fib  . Oysters [Shellfish Allergy] Nausea And Vomiting  . Sulfa Antibiotics Rash     Outpatient Medications Prior to Visit  Medication Sig Dispense Refill  . amiodarone (PACERONE) 200 MG tablet Take 0.5 tablets (100 mg total) by mouth daily. 30 tablet 0  . calcitRIOL (ROCALTROL) 0.25 MCG capsule Take 0.25 mcg by mouth at bedtime.     . Coenzyme Q10 (COQ10 PO) Take 1 capsule by mouth at bedtime.     Marland Kitchen estradiol (ESTRACE VAGINAL) 0.1 MG/GM vaginal cream Apply pea-sized amount to vagina / urethra 2-3 x weekly to prevent labial adhesions (Patient taking differently: Place vaginally See admin instructions. Apply a pea-sized amount to  vagina/urethra 2-3 times a week to prevent labial adhesions) 42.5 g 12  . furosemide (LASIX) 40 MG tablet Take 120 mg by mouth daily. Take 3 tablets by mouth daily.    Marland Kitchen levothyroxine (SYNTHROID, LEVOTHROID) 50 MCG tablet Take 50 mcg by mouth daily before breakfast.     . Magnesium 100 MG CAPS Take 100 mg by mouth 2 (two) times daily.     . metoprolol tartrate (LOPRESSOR) 25 MG tablet Take 50 mg by mouth 2 (two) times daily.    . potassium chloride SA (K-DUR,KLOR-CON) 20 MEQ tablet Take 20 mEq by mouth 3 (three) times a week.     . rosuvastatin (CRESTOR) 10 MG tablet Take 10 mg by mouth at bedtime.     . Turmeric 500 MG CAPS Take 500 mg by mouth 2 (two) times daily.    Marland Kitchen warfarin (COUMADIN) 3 MG tablet Take 1.5-3 mg by mouth See admin instructions. Take 1.5 mg by mouth in the evening on Sun/Wed and 3 mg on Mon/Tues/Thurs/Fri/Sat    . umeclidinium-vilanterol (ANORO ELLIPTA) 62.5-25 MCG/INH AEPB Inhale 1 puff into the lungs daily. 2 each 0   No facility-administered medications prior to visit.        Objective:   Physical Exam Vitals:   02/13/19 1506  BP: 124/84  Pulse: 78  SpO2: 90%  Weight: 209 lb (94.8 kg)  Height: 5' (1.524 m)   Gen: Pleasant, obese woman, in no distress,  normal affect  ENT: No lesions,  mouth clear,  oropharynx clear, no postnasal drip  Neck: No JVD, no stridor  Lungs: No use of accessory muscles, clear without rales or rhonchi  Cardiovascular: RRR, heart sounds normal, no murmur or gallops, no peripheral edema  Musculoskeletal: No deformities, no cyanosis or clubbing  Neuro: alert, non focal  Skin: Warm, no lesions or rashes     Assessment & Plan:  Tracheomalacia This would appear to be the most significant pulmonary issue impacting her exertional dyspnea.  Also probably components of restrictive lung disease from her obesity, deconditioning, underlying diastolic CHF.  She has not responded to bronchodilators.  I think we can stop the Anoro as I do  not believe it has been effective.  Unfortunately stenting is not a great option and there is no real way to reverse this process.  I think that cardiopulmonary conditioning will give her the most benefit.  I  offered pulmonary rehab and she is going to think about it.  OSA (obstructive sleep apnea) Continue CPAP as she has been using it.  Baltazar Apo, MD, PhD 02/13/2019, 4:00 PM [facility] [phone] or if no answer [phone]

## 2019-02-13 NOTE — Assessment & Plan Note (Addendum)
This would appear to be the most significant pulmonary issue impacting her exertional dyspnea.  Also probably components of restrictive lung disease from her obesity, deconditioning, underlying diastolic CHF.  She has not responded to bronchodilators.  I think we can stop the Anoro as I do not believe it has been effective.  Unfortunately stenting is not a great option and there is no real way to reverse this process.  I think that cardiopulmonary conditioning will give her the most benefit.  I offered pulmonary rehab and she is going to think about it.

## 2019-02-13 NOTE — Patient Instructions (Signed)
We will plan to stop Anoro since it has not changed your breathing significantly Your CT scan of the chest shows no evidence of lung scarring but confirms a significant collapse of your large airways on expiration Recommend that you wear your oxygen with all exertion Agree with increasing your exercise and physical conditioning as much as possible.  We can refer you to pulmonary rehab for observed exercise to help you build up her stamina and maximize her cardiopulmonary conditioning, functional capacity.  Please let us know if you would like [place] to make that referral. Follow with Dr Lamonte Sakai as needed for any changes in your breathing

## 2019-02-19 ENCOUNTER — Telehealth: Payer: Self-pay | Admitting: Emergency Medicine

## 2019-02-19 DIAGNOSIS — R0602 Shortness of breath: Secondary | ICD-10-CM

## 2019-02-19 DIAGNOSIS — J849 Interstitial pulmonary disease, unspecified: Secondary | ICD-10-CM

## 2019-02-19 NOTE — Telephone Encounter (Signed)
LMTCB

## 2019-02-19 NOTE — Telephone Encounter (Signed)
Patient is returning phone call.  Patient phone number is [phone].

## 2019-02-19 NOTE — Telephone Encounter (Signed)
Called and spoke with pt letting her know that pulmonary rehab is usually done at either [facility] or [place] and then pt said that there are qualifying people at [facility] where she lives that can do pulmonary rehab.  Dr. Lamonte Sakai, please advise if you are okay with [place] placing order for pulmonary rehab to be done at [facility].

## 2019-02-24 ENCOUNTER — Other Ambulatory Visit: Payer: Self-pay

## 2019-02-24 MED ORDER — METOPROLOL TARTRATE 25 MG PO TABS: 50.0000 mg | ORAL_TABLET | Freq: Two times a day (BID) | ORAL | 3 refills | Status: DC

## 2019-02-24 NOTE — Telephone Encounter (Signed)
Order placed.    LVM for patient to advise the referral was placed and to call back if there are any additional questions.

## 2019-02-24 NOTE — Telephone Encounter (Signed)
This encounter was created in error - please disregard.

## 2019-02-24 NOTE — Telephone Encounter (Signed)
Yes I am fine with this, although I am not sure exactly what they are able to offer at [facility]. You may have to call them so we will know how to word the order (ex pulm rehab, PT, etc)

## 2019-02-26 NOTE — Progress Notes (Signed)
PPM remote 

## 2019-05-21 ENCOUNTER — Telehealth: Payer: Self-pay | Admitting: Emergency Medicine

## 2019-05-21 NOTE — Telephone Encounter (Signed)
I called the number given and nobody answered. Left detailed message to call us back.

## 2019-05-21 NOTE — Telephone Encounter (Signed)
Spoke with [place], she states that pt may be having an exacerbation of her COPD. She states the SOB is getting worse and patient is making comments saying "she cant live like this" She couldn't make her bed today because she was so SOB.. She did much better on Medrol dose pak and wonders if Dr. Lamonte Sakai would be ok with giving her a daily dose of prednisone as a maintenance? Dr. Lamonte Sakai please advise.

## 2019-05-22 ENCOUNTER — Telehealth: Payer: Self-pay | Admitting: Emergency Medicine

## 2019-05-22 ENCOUNTER — Ambulatory Visit (INDEPENDENT_AMBULATORY_CARE_PROVIDER_SITE_OTHER): Payer: Medicare Other | Admitting: *Deleted

## 2019-05-22 DIAGNOSIS — I495 Sick sinus syndrome: Secondary | ICD-10-CM | POA: Diagnosis not present

## 2019-05-22 LAB — CUP PACEART REMOTE DEVICE CHECK
Battery Remaining Longevity: 76 mo
Battery Voltage: 3.01 V
Brady Statistic AP VP Percent: 0.17 %
Brady Statistic AP VS Percent: 68.74 %
Brady Statistic AS VP Percent: 0.25 %
Brady Statistic AS VS Percent: 30.84 %
Brady Statistic RA Percent Paced: 66.6 %
Brady Statistic RV Percent Paced: 0.44 %
Date Time Interrogation Session: 20210317232725
Implantable Lead Implant Date: 20161214
Implantable Lead Implant Date: 20161214
Implantable Lead Location: 753859
Implantable Lead Location: 753860
Implantable Lead Model: 5076
Implantable Lead Model: 5076
Implantable Pulse Generator Implant Date: 20161214
Lead Channel Impedance Value: 418 Ohm
Lead Channel Impedance Value: 437 Ohm
Lead Channel Impedance Value: 456 Ohm
Lead Channel Impedance Value: 456 Ohm
Lead Channel Pacing Threshold Amplitude: 0.875 V
Lead Channel Pacing Threshold Amplitude: 1.125 V
Lead Channel Pacing Threshold Pulse Width: 0.4 ms
Lead Channel Pacing Threshold Pulse Width: 0.4 ms
Lead Channel Sensing Intrinsic Amplitude: 10.25 mV
Lead Channel Sensing Intrinsic Amplitude: 10.25 mV
Lead Channel Sensing Intrinsic Amplitude: 3.625 mV
Lead Channel Sensing Intrinsic Amplitude: 3.625 mV
Lead Channel Setting Pacing Amplitude: 2 V
Lead Channel Setting Pacing Amplitude: 2.5 V
Lead Channel Setting Pacing Pulse Width: 0.4 ms
Lead Channel Setting Sensing Sensitivity: 2.8 mV

## 2019-05-22 NOTE — Telephone Encounter (Signed)
I let the pt know the nurse call by mistake and I apologized for the inconvenience. I told her the nurse had a question but answered her own question before the call went all the way thru. The pt verbalized understanding and thanked me for my help.

## 2019-05-22 NOTE — Telephone Encounter (Signed)
Office is now closed  [facility] for [facility]

## 2019-05-22 NOTE — Telephone Encounter (Signed)
I am not convinced based on her workup to date that daily prednisone is the solution here. I am open to giving her another prednisone taper to temporize until I can see her in office to trouble shoot. Please make her an OV in the next few weeks with me.   Pred >> Take 40mg  daily for 3 days, then 30mg  daily for 3 days, then 20mg  daily for 3 days, then 10mg  daily for 3 days, then stop

## 2019-05-23 MED ORDER — PREDNISONE 10 MG PO TABS: ORAL_TABLET | ORAL | 0 refills | Status: DC

## 2019-05-23 NOTE — Progress Notes (Signed)
PPM Remote  

## 2019-05-23 NOTE — Telephone Encounter (Signed)
Called Chrissy at [facility] to make aware of RB's recs. Called pt to make aware of RB's recs.  Sent in pred taper, scheduled ROV with RB.  Nothing further needed at this time- will close encounter.

## 2019-05-23 NOTE — Telephone Encounter (Signed)
Enoch of [facility] called regarding this pt and prednisone. They would like to see if [place] is willing to put her on long term prednisone because it made such a difference for the pt.   Chrissy is the contact at [facility] (assisted living) : [phone]

## 2019-06-15 NOTE — Progress Notes (Signed)
Cardiology Office Note   Date:  06/16/2019   ID:  Lynn, Dunn 06-Oct-1930, MRN [id number]  PCP:  Javier Glazier, MD    No chief complaint on file.  PAF  Wt Readings from Last 3 Encounters:  06/16/19 201 lb (91.2 kg)  02/13/19 209 lb (94.8 kg)  02/03/19 200 lb (90.7 kg)       History of Present Illness: Lynn Dunn is a 86 y.o. female   Who has had AFib and CVA in the past, treated with TPA. She has had atrial fibrillation ablation in the past, and had distinct palpitations with AFib.   She hada loop monitor in place due to syncope/near syncope. She had a pacer placed.  She had knee surgery in Jun 05, 2014.   Unfortunately, her husband passed away in 06/05/15 from aortic stenosis.   She has had chronic shortness of breath and was seen in 6/18 by Dr. Rayann Heman. His plan was : "Unclear etiology(of [facility]) I have discussed at length by phone with Dr Irish Lack today Will obtain cardiac CT to evaluate for pulmonary vein stenosis. If CT is unrevealing, would refer to pulmonary for further assessment. "  Amio was decreased for [facility].   Negative w/u for CAD by prior cath.   Per Dr. Lamonte Sakai in 02/2019: "Tracheomalacia This would appear to be the most significant pulmonary issue impacting her exertional dyspnea.  Also probably components of restrictive lung disease from her obesity, deconditioning, underlying diastolic CHF.  She has not responded to bronchodilators.  I think we can stop the Anoro as I do not believe it has been effective.  Unfortunately stenting is not a great option and there is no real way to reverse this process.  I think that cardiopulmonary conditioning will give her the most benefit.  I offered pulmonary rehab and she is going to think about it.  OSA (obstructive sleep apnea) Continue CPAP as she has been using it."  Patient instructions as follows from pulmonary: "Your CT scan of the chest shows no evidence of lung scarring but confirms a significant  collapse of your large airways on expiration Recommend that you wear your oxygen with all exertion Agree with increasing your exercise and physical conditioning as much as possible.  We can refer you to pulmonary rehab for observed exercise to help you build up her stamina and maximize her cardiopulmonary conditioning, functional capacity.  Please let us know if you would like [place] to make that referral."  She is not going to have the stent procedure.   She felt the [facility] was better with prednisone, and worse off of it.  Amiodarone was completely stopped.  No AFib since then.   Bronchitis in Jan was taxing.    She got her COVID vaccines.  SHe got it at [facility].   She is not going to do pulmonary rehab and transportation makes it difficult.  She does water aerobics at her residence.     Past Medical History:  Diagnosis Date   Airway malacia    Anemia    Anginal pain ([place]) 04-Jun-2009   had to take nitro    Arthritis    COPD (chronic obstructive pulmonary disease) (HCC)    Dysuria    GERD (gastroesophageal reflux disease)    Hematuria    HTN (hypertension)    Hx of echocardiogram    Echo (12/15):  Mild LVH, EF 60-65%, mild MR, mod LAE (LA 49 mm), PASP 43 mmHg   Obesity  OSA (obstructive sleep apnea)    cpap at home   PAF (paroxysmal atrial fibrillation) ([place])    s/p ablation 07/29/09   Sick sinus syndrome (HCC)    pauses with syncope   Stroke ([place]) 6/09   NO RESIDUAL PROBLEMS    Past Surgical History:  Procedure Laterality Date   ABDOMINAL HYSTERECTOMY     ATRIAL ABLATION SURGERY     s/p ablation for afib 07/29/09   CYSTOSCOPY N/A 06/23/2015   Procedure: CYSTOSCOPY;  Surgeon: Alexis Frock, MD;  Location: [facility];  Service: Urology;  Laterality: N/A;   EP IMPLANTABLE DEVICE N/A 02/17/2015   MDT Advisa MRI PPM implanted by Dr Lovena Le for sick sinus and syncope   EP IMPLANTABLE DEVICE N/A 02/17/2015   Procedure: Loop Recorder Removal;  Surgeon: Evans Lance, MD;  Location: [place] CV LAB;  Service: Cardiovascular;  Laterality: N/A;   ESOPHAGOGASTRODUODENOSCOPY  01/24/2012   Procedure: ESOPHAGOGASTRODUODENOSCOPY (EGD);  Surgeon: Jeryl Columbia, MD;  Location: [facility] ENDOSCOPY;  Service: Endoscopy;  Laterality: N/A;   KNEE ARTHROSCOPY     BILATERAL   LOOP RECORDER IMPLANT N/A 03/12/2014   Procedure: LOOP RECORDER IMPLANT;  Surgeon: Thompson Grayer, MD;  Location: [facility] CATH LAB;  Service: Cardiovascular;  Laterality: N/A;   LYSIS OF ADHESION N/A 06/23/2015   Procedure: LYSIS OF LABIAL ADHESION;  Surgeon: Alexis Frock, MD;  Location: [facility];  Service: Urology;  Laterality: N/A;   VIDEO BRONCHOSCOPY Bilateral 12/15/2016   Procedure: VIDEO BRONCHOSCOPY WITHOUT FLUORO;  Surgeon: Collene Gobble, MD;  Location: [facility] ENDOSCOPY;  Service: Cardiopulmonary;  Laterality: Bilateral;     Current Outpatient Medications  Medication Sig Dispense Refill   calcitRIOL (ROCALTROL) 0.25 MCG capsule Take 0.25 mcg by mouth at bedtime.      Coenzyme Q10 (COQ10 PO) Take 1 capsule by mouth at bedtime.      estradiol (ESTRACE VAGINAL) 0.1 MG/GM vaginal cream Apply pea-sized amount to vagina / urethra 2-3 x weekly to prevent labial adhesions (Patient taking differently: Place vaginally See admin instructions. Apply a pea-sized amount to vagina/urethra 2-3 times a week to prevent labial adhesions) 42.5 g 12   furosemide (LASIX) 40 MG tablet Take 120 mg by mouth daily. Take 3 tablets by mouth daily.     levothyroxine (SYNTHROID, LEVOTHROID) 50 MCG tablet Take 50 mcg by mouth daily before breakfast.      Magnesium 100 MG CAPS Take 100 mg by mouth 2 (two) times daily.      metoprolol tartrate (LOPRESSOR) 25 MG tablet Take 2 tablets (50 mg total) by mouth 2 (two) times daily. Take 50 mg by mouth 2 (two) times daily. (Patient taking differently: Take 75 mg by mouth 2 (two) times daily. Take 75 mg by mouth 2 (two) times daily.) 360 tablet 3   potassium chloride SA  (K-DUR,KLOR-CON) 20 MEQ tablet Take 20 mEq by mouth 3 (three) times a week.      predniSONE (DELTASONE) 10 MG tablet 40mg X3 days, 30mg  X3 days, 20mg  X3 days, 10mg X3 days, then stop. 30 tablet 0   rosuvastatin (CRESTOR) 10 MG tablet Take 10 mg by mouth at bedtime.      Turmeric 500 MG CAPS Take 500 mg by mouth 2 (two) times daily.     warfarin (COUMADIN) 3 MG tablet Take 1.5-3 mg by mouth See admin instructions. Take 1.5 mg by mouth in the evening on Sun/Wed and 3 mg on Mon/Tues/Thurs/Fri/Sat     No current facility-administered medications for this visit.  Allergies:   Ace inhibitors, Albuterol, Albuterol sulfate, Oysters [shellfish allergy], and Sulfa antibiotics    Social History:  The patient  reports that she has never smoked. She has never used smokeless tobacco. She reports current alcohol use of about 2.0 standard drinks of alcohol per week. She reports that she does not use drugs.   Family History:  The patient's family history includes Brain cancer in her father and another family member; Breast cancer in her mother and another family member; Hypertension in her sister; Stroke in her maternal aunt.    ROS:  Please see the history of present illness.   Otherwise, review of systems are positive for ankle swelling.   All other systems are reviewed and negative.    PHYSICAL EXAM: VS:  BP 120/68    Pulse 85    Ht 5' (1.524 m)    Wt 201 lb (91.2 kg)    LMP  (LMP Unknown)    SpO2 90%    BMI 39.26 kg/m  , BMI Body mass index is 39.26 kg/m. GEN: Well nourished, well developed, in no acute distress  HEENT: normal  Neck: no JVD, carotid bruits, or masses Cardiac: RRR; no murmurs, rubs, or gallops,no edema  Respiratory:  clear to auscultation bilaterally, normal work of breathing GI: soft, nontender, nondistended, + BS MS: no deformity or atrophy  Skin: warm and dry, no rash Neuro:  Strength and sensation are intact Psych: euthymic mood, full affect   EKG:   The ekg ordered  11/20 demonstrates NSR, poor R wave progression   Recent Labs: 11/01/2018: NT-Pro BNP 595 01/10/2019: B Natriuretic Peptide 171.0; Magnesium 2.4 01/11/2019: Hemoglobin 12.2; Platelets 230 01/15/2019: ALT 16; BUN 30; Creatinine, Ser 1.60; Potassium 4.2; Sodium 138; TSH 2.208   Lipid Panel    Component Value Date/Time   CHOL 170 08/31/2014 0941   TRIG 162.0 (H) 08/31/2014 0941   HDL 51.80 08/31/2014 0941   CHOLHDL 3 08/31/2014 0941   VLDL 32.4 08/31/2014 0941   LDLCALC 86 08/31/2014 0941     Other studies Reviewed: Additional studies/ records that were reviewed today with results demonstrating: labs from PMD.   ASSESSMENT AND PLAN:  1. PAF: Low-dose amiodarone was been continued since she is quite symptomatic when she has atrial fibrillation.  She stopped the Amio now due to shortness of breath. She has been off since Feb 2021.  WOuld have to restart if she had recurrent AFib.  2. Chronic DOE: multifactorial.  Tracheomalacia, deconditionng, obesity.  Pulm rehab offered to her in 2020.  She was seen by [facility] in January 2019 and a stenting procedure was considered as well. 3. Hyperlipidemia: LDL 66 in 2020.  TG 249 in 2020.  4. Anticoagulated: Warfarin for stroke prevention.  INR 2.8 in 3/21. Easy bruising only.  No internal bleeding.   Current medicines are reviewed at length with the patient today.  The patient concerns regarding her medicines were addressed.  The following changes have been made:  No change  Labs/ tests ordered today include:  No orders of the defined types were placed in this encounter.   Recommend 150 minutes/week of aerobic exercise Low fat, low carb, high fiber diet recommended  Disposition:   FU in 1 year   Signed, Larae Grooms, MD  06/16/2019 10:32 AM    [facility] [place], [place], [place]  [address] Phone: [phone]; Fax: [phone]

## 2019-06-16 ENCOUNTER — Encounter: Payer: Self-pay | Admitting: Interventional Cardiology

## 2019-06-16 ENCOUNTER — Ambulatory Visit (INDEPENDENT_AMBULATORY_CARE_PROVIDER_SITE_OTHER): Payer: Medicare Other | Admitting: Interventional Cardiology

## 2019-06-16 ENCOUNTER — Other Ambulatory Visit: Payer: Self-pay

## 2019-06-16 VITALS — BP 120/68 | HR 85 | Ht 60.0 in | Wt 201.0 lb

## 2019-06-16 DIAGNOSIS — E782 Mixed hyperlipidemia: Secondary | ICD-10-CM

## 2019-06-16 DIAGNOSIS — I4819 Other persistent atrial fibrillation: Secondary | ICD-10-CM | POA: Diagnosis not present

## 2019-06-16 DIAGNOSIS — R0600 Dyspnea, unspecified: Secondary | ICD-10-CM

## 2019-06-16 DIAGNOSIS — Z7901 Long term (current) use of anticoagulants: Secondary | ICD-10-CM

## 2019-06-16 DIAGNOSIS — R0609 Other forms of dyspnea: Secondary | ICD-10-CM

## 2019-06-16 NOTE — Patient Instructions (Signed)

## 2019-06-23 ENCOUNTER — Ambulatory Visit (INDEPENDENT_AMBULATORY_CARE_PROVIDER_SITE_OTHER): Payer: Medicare Other | Admitting: Emergency Medicine

## 2019-06-23 ENCOUNTER — Encounter: Payer: Self-pay | Admitting: Emergency Medicine

## 2019-06-23 ENCOUNTER — Other Ambulatory Visit: Payer: Self-pay

## 2019-06-23 DIAGNOSIS — J449 Chronic obstructive pulmonary disease, unspecified: Secondary | ICD-10-CM | POA: Diagnosis not present

## 2019-06-23 DIAGNOSIS — G4733 Obstructive sleep apnea (adult) (pediatric): Secondary | ICD-10-CM

## 2019-06-23 MED ORDER — PREDNISONE 5 MG PO TBEC: 5.0000 mg | DELAYED_RELEASE_TABLET | Freq: Every day | ORAL | 5 refills | Status: DC

## 2019-06-23 NOTE — Patient Instructions (Addendum)
We will decrease prednisone to 5mg  daily until next visit Restart Spiriva once daily Wear your oxygen with exertion. Our goal is for your oxygen saturations to stay > 90% Continue your water aerobics.  COVID vaccine is up to date.  Continue CPAP every night as you have been using it Follow with Dr Lamonte Sakai in 3 months or sooner if you have any problems.

## 2019-06-23 NOTE — Assessment & Plan Note (Signed)
I believe that most of her dyspnea is related to her tracheobronchomalacia but she has clinically benefited from prednisone.  Like to wean her to the lowest effective dose, go down to 5 mg now.  Possibly we can decrease further going forward.  We can determine how long to stay on this based on her clinical response.  I will restart her Spiriva given the circumstantial evidence that there is airflow obstruction that may respond to the prednisone.

## 2019-06-23 NOTE — Assessment & Plan Note (Signed)
Continue CPAP as ordered 

## 2019-06-23 NOTE — Addendum Note (Signed)
Addended by: Gavin Potters R on: 06/23/2019 12:22 PM   Modules accepted: Orders

## 2019-06-23 NOTE — Progress Notes (Signed)
Subjective:    Patient ID: Lynn Dunn, female    DOB: 10-Sep-1930, 86 y.o.   MRN: [id number]  HPI   ROV 02/13/2019 --follow-up visit for 86 year old woman with a history of obstructive lung disease and bronchotracheal malacia that contributes to significant dyspnea.  She never really responded to bronchodilators.  She has a history of obstructive sleep apnea, uses CPAP.  She has been managed most recently on Anoro - didn't notice that it helped her very much at all.  Her other history includes hypertension which has been labile, she has been seen in the emergency department twice with hypertension and syncope, dyspnea. She has exercise tachycardia, dyspnea, has seen some desaturations.  She had a CT chest since last time that I reviewed > no evidence ILD, significant collapsibility of the large airways.   ROV 06/23/19 --Lynn Dunn is 66, follows up today for her history of tracheobronchomalacia, COPD, OSA on CPAP.  She was having increased dyspnea 1 month ago and based on improvement in symptoms in the past on prednisone requested a prednisone taper.  I gave her this and she reports that she took 10mg  daily instead of the taper. Has spiriva but does not use it.    Review of Systems  Constitutional: Positive for unexpected weight change. Negative for fever.  HENT: Positive for congestion. Negative for dental problem, ear pain, nosebleeds, postnasal drip, rhinorrhea, sinus pressure, sneezing, sore throat and trouble swallowing.   Eyes: Negative for redness and itching.  Respiratory: Positive for cough and shortness of breath. Negative for chest tightness and wheezing.   Cardiovascular: Positive for palpitations. Negative for leg swelling.  Gastrointestinal: Negative for nausea and vomiting.  Genitourinary: Negative for dysuria.  Musculoskeletal: Negative for joint swelling.  Skin: Negative for rash.  Neurological: Negative for headaches.  Hematological: Does not bruise/bleed easily.    Psychiatric/Behavioral: Negative for dysphoric mood. The patient is not nervous/anxious.     Past Medical History:  Diagnosis Date  . Airway malacia   . Anemia   . Anginal pain ([place]) 2011   had to take nitro   . Arthritis   . COPD (chronic obstructive pulmonary disease) ([place])   . Dysuria   . GERD (gastroesophageal reflux disease)   . Hematuria   . HTN (hypertension)   . Hx of echocardiogram    Echo (12/15):  Mild LVH, EF 60-65%, mild MR, mod LAE (LA 49 mm), PASP 43 mmHg  . Obesity   . OSA (obstructive sleep apnea)    cpap at home  . PAF (paroxysmal atrial fibrillation) ([place])    s/p ablation 07/29/09  . Sick sinus syndrome (HCC)    pauses with syncope  . Stroke ([place]) 6/09   NO RESIDUAL PROBLEMS     Family History  Problem Relation Age of Onset  . Breast cancer Mother   . Brain cancer Father   . Breast cancer Other   . Brain cancer Other   . Stroke Maternal Aunt   . Hypertension Sister   . Heart attack Neg Hx      Social History   Socioeconomic History  . Marital status: Widowed    Spouse name: Not on file  . Number of children: Not on file  . Years of education: Not on file  . Highest education level: Not on file  Occupational History  . Not on file  Tobacco Use  . Smoking status: Never Smoker  . Smokeless tobacco: Never Used  Substance and Sexual Activity  .  Alcohol use: Yes    Alcohol/week: 2.0 standard drinks    Types: 2 Glasses of wine per week    Comment: occassionally  . Drug use: No  . Sexual activity: Not on file  Other Topics Concern  . Not on file  Social History Narrative  . Not on file   Social Determinants of Health   Financial Resource Strain:   . Difficulty of Paying Living Expenses:   Food Insecurity:   . Worried About Charity fundraiser in the Last Year:   . Arboriculturist in the Last Year:   Transportation Needs:   . Film/video editor (Medical):   Marland Kitchen Lack of Transportation (Non-Medical):   Physical Activity:   . Days  of Exercise per Week:   . Minutes of Exercise per Session:   Stress:   . Feeling of Stress :   Social Connections:   . Frequency of Communication with Friends and Family:   . Frequency of Social Gatherings with Friends and Family:   . Attends Religious Services:   . Active Member of Clubs or Organizations:   . Attends Archivist Meetings:   Marland Kitchen Marital Status:   Intimate Partner Violence:   . Fear of Current or Ex-Partner:   . Emotionally Abused:   Marland Kitchen Physically Abused:   . Sexually Abused:   grew up near [employer], Charity fundraiser. Husband worked w asbestos.  Worked in Scientist, research (life sciences) estate  Allergies  Allergen Reactions  . Ace Inhibitors Cough  . Albuterol Palpitations and Other (See Comments)    Atrial fibrillation   . Albuterol Sulfate Palpitations and Other (See Comments)    Causes A-Fib  . Oysters [Shellfish Allergy] Nausea And Vomiting  . Sulfa Antibiotics Rash     Outpatient Medications Prior to Visit  Medication Sig Dispense Refill  . calcitRIOL (ROCALTROL) 0.25 MCG capsule Take 0.25 mcg by mouth at bedtime.     . Coenzyme Q10 (COQ10 PO) Take 1 capsule by mouth at bedtime.     Marland Kitchen estradiol (ESTRACE VAGINAL) 0.1 MG/GM vaginal cream Apply pea-sized amount to vagina / urethra 2-3 x weekly to prevent labial adhesions (Patient taking differently: Place vaginally See admin instructions. Apply a pea-sized amount to vagina/urethra 2-3 times a week to prevent labial adhesions) 42.5 g 12  . furosemide (LASIX) 40 MG tablet Take 120 mg by mouth daily. Take 3 tablets by mouth daily.    Marland Kitchen levothyroxine (SYNTHROID, LEVOTHROID) 50 MCG tablet Take 50 mcg by mouth daily before breakfast.     . Magnesium 100 MG CAPS Take 100 mg by mouth 2 (two) times daily.     . metoprolol tartrate (LOPRESSOR) 25 MG tablet Take 2 tablets (50 mg total) by mouth 2 (two) times daily. Take 50 mg by mouth 2 (two) times daily. (Patient taking differently: Take 75 mg by mouth 2 (two) times daily. Take 75 mg by mouth 2  (two) times daily.) 360 tablet 3  . potassium chloride SA (K-DUR,KLOR-CON) 20 MEQ tablet Take 20 mEq by mouth 3 (three) times a week.     . predniSONE (DELTASONE) 10 MG tablet 40mg X3 days, 30mg  X3 days, 20mg  X3 days, 10mg X3 days, then stop. 30 tablet 0  . rosuvastatin (CRESTOR) 10 MG tablet Take 10 mg by mouth at bedtime.     . Turmeric 500 MG CAPS Take 500 mg by mouth 2 (two) times daily.    Marland Kitchen warfarin (COUMADIN) 3 MG tablet Take 1.5-3 mg by mouth See admin instructions.  Take 1.5 mg by mouth in the evening on Sun/Wed and 3 mg on Mon/Tues/Thurs/Fri/Sat     No facility-administered medications prior to visit.        Objective:   Physical Exam Vitals:   06/23/19 1152  BP: 130/70  Pulse: 86  SpO2: 95%  Weight: 205 lb (93 kg)  Height: 5' (1.524 m)   Gen: Pleasant, obese woman, in no distress,  normal affect  ENT: No lesions,  mouth clear,  oropharynx clear, no postnasal drip  Neck: No JVD, no stridor  Lungs: No use of accessory muscles, clear without rales or rhonchi. Significant decrease in breath sounds w a forced exp  Cardiovascular: RRR, heart sounds normal, no murmur or gallops, no peripheral edema  Musculoskeletal: No deformities, no cyanosis or clubbing  Neuro: alert, non focal  Skin: Warm, no lesions or rashes     Assessment & Plan:  COPD (chronic obstructive pulmonary disease) ([place]) I believe that most of her dyspnea is related to her tracheobronchomalacia but she has clinically benefited from prednisone.  Like to wean her to the lowest effective dose, go down to 5 mg now.  Possibly we can decrease further going forward.  We can determine how long to stay on this based on her clinical response.  I will restart her Spiriva given the circumstantial evidence that there is airflow obstruction that may respond to the prednisone.  OSA (obstructive sleep apnea) Continue CPAP as ordered  Baltazar Apo, MD, PhD 06/23/2019, 12:10 PM [facility] [phone] or if no answer [phone]

## 2019-08-21 ENCOUNTER — Ambulatory Visit (INDEPENDENT_AMBULATORY_CARE_PROVIDER_SITE_OTHER): Payer: Medicare Other | Admitting: *Deleted

## 2019-08-21 DIAGNOSIS — I4891 Unspecified atrial fibrillation: Secondary | ICD-10-CM

## 2019-08-24 LAB — CUP PACEART REMOTE DEVICE CHECK
Battery Remaining Longevity: 76 mo
Battery Voltage: 3.01 V
Brady Statistic AP VP Percent: 0.04 %
Brady Statistic AP VS Percent: 65.46 %
Brady Statistic AS VP Percent: 0.03 %
Brady Statistic AS VS Percent: 34.47 %
Brady Statistic RA Percent Paced: 65.38 %
Brady Statistic RV Percent Paced: 0.08 %
Date Time Interrogation Session: 20210619101109
Implantable Lead Implant Date: 20161214
Implantable Lead Implant Date: 20161214
Implantable Lead Location: 753859
Implantable Lead Location: 753860
Implantable Lead Model: 5076
Implantable Lead Model: 5076
Implantable Pulse Generator Implant Date: 20161214
Lead Channel Impedance Value: 418 Ohm
Lead Channel Impedance Value: 418 Ohm
Lead Channel Impedance Value: 456 Ohm
Lead Channel Impedance Value: 532 Ohm
Lead Channel Pacing Threshold Amplitude: 1.125 V
Lead Channel Pacing Threshold Amplitude: 1.375 V
Lead Channel Pacing Threshold Pulse Width: 0.4 ms
Lead Channel Pacing Threshold Pulse Width: 0.4 ms
Lead Channel Sensing Intrinsic Amplitude: 3.25 mV
Lead Channel Sensing Intrinsic Amplitude: 3.25 mV
Lead Channel Sensing Intrinsic Amplitude: 9.75 mV
Lead Channel Sensing Intrinsic Amplitude: 9.75 mV
Lead Channel Setting Pacing Amplitude: 2.25 V
Lead Channel Setting Pacing Amplitude: 2.75 V
Lead Channel Setting Pacing Pulse Width: 0.4 ms
Lead Channel Setting Sensing Sensitivity: 2.8 mV

## 2019-08-25 NOTE — Progress Notes (Signed)
Remote pacemaker transmission.   

## 2019-10-17 ENCOUNTER — Other Ambulatory Visit: Payer: Self-pay | Admitting: Rehabilitation

## 2019-10-17 DIAGNOSIS — T148XXA Other injury of unspecified body region, initial encounter: Secondary | ICD-10-CM

## 2019-10-20 ENCOUNTER — Ambulatory Visit
Admission: RE | Admit: 2019-10-20 | Discharge: 2019-10-20 | Disposition: A | Discharge status: Home / Self Care | Payer: Medicare Other | Source: Ambulatory Visit | Attending: Rehabilitation | Admitting: Rehabilitation

## 2019-10-20 DIAGNOSIS — T148XXA Other injury of unspecified body region, initial encounter: Secondary | ICD-10-CM

## 2019-11-12 ENCOUNTER — Telehealth: Payer: Self-pay | Admitting: Internal Medicine

## 2019-11-12 NOTE — Telephone Encounter (Signed)
Called to offer appt today. Pt cannot come until tomorrow - appt made. Pt in agreement.

## 2019-11-12 NOTE — Telephone Encounter (Signed)
Pt reports that she didn't sleep at all last night and feels that she went into AFib early this morning, maybe around 2 am. She is still currently in AFib. Reports HRs as high as 120s, avg low 100s at rest. "If I walk across the room I feel off", like she is going to pass out. No chest pain, edema, syncope or weight gain.  She is experiencing SOB w/ AFib. Pt aware I will forward this to the AFib clinic to have pt seen today/tomorrow for further evaluation.  (she will be getting a ride and not sure if she can make it today, so told her I would have clinic call to determine when she can be seen). Pt agreeable to plan.

## 2019-11-12 NOTE — Telephone Encounter (Signed)
Patient c/o Palpitations:  High priority if patient c/o lightheadedness, shortness of breath, or chest pain  How long have you had palpitations/irregular HR/ Afib? Are you having the symptoms now? Occurred today, still having now   1) Are you currently experiencing lightheadedness, SOB or CP? SOB   2) Do you have a history of afib (atrial fibrillation) or irregular heart rhythm? Yes   3) Have you checked your BP or HR? (document readings if available):  (AM) 185/90 HR 122 (After additional metoprolol was taken) 135/65 HR stayed over 100   4) Are you experiencing any other symptoms? Feels funny

## 2019-11-13 ENCOUNTER — Encounter (HOSPITAL_COMMUNITY): Payer: Self-pay | Admitting: Physician Assistant

## 2019-11-13 ENCOUNTER — Ambulatory Visit (HOSPITAL_COMMUNITY)
Admission: RE | Admit: 2019-11-13 | Discharge: 2019-11-13 | Disposition: A | Discharge status: Home / Self Care | Payer: Medicare Other | Source: Ambulatory Visit | Attending: Physician Assistant | Admitting: Physician Assistant

## 2019-11-13 ENCOUNTER — Other Ambulatory Visit: Payer: Self-pay

## 2019-11-13 VITALS — BP 128/70 | HR 74 | Ht 60.0 in | Wt 215.4 lb

## 2019-11-13 DIAGNOSIS — I4819 Other persistent atrial fibrillation: Secondary | ICD-10-CM | POA: Diagnosis present

## 2019-11-13 DIAGNOSIS — I495 Sick sinus syndrome: Secondary | ICD-10-CM | POA: Diagnosis not present

## 2019-11-13 DIAGNOSIS — I2510 Atherosclerotic heart disease of native coronary artery without angina pectoris: Secondary | ICD-10-CM | POA: Insufficient documentation

## 2019-11-13 DIAGNOSIS — E669 Obesity, unspecified: Secondary | ICD-10-CM | POA: Diagnosis not present

## 2019-11-13 DIAGNOSIS — I252 Old myocardial infarction: Secondary | ICD-10-CM | POA: Diagnosis not present

## 2019-11-13 DIAGNOSIS — Z6841 Body Mass Index (BMI) 40.0 and over, adult: Secondary | ICD-10-CM | POA: Insufficient documentation

## 2019-11-13 DIAGNOSIS — D6869 Other thrombophilia: Secondary | ICD-10-CM | POA: Diagnosis not present

## 2019-11-13 DIAGNOSIS — Z79890 Hormone replacement therapy (postmenopausal): Secondary | ICD-10-CM | POA: Diagnosis not present

## 2019-11-13 DIAGNOSIS — J449 Chronic obstructive pulmonary disease, unspecified: Secondary | ICD-10-CM | POA: Diagnosis not present

## 2019-11-13 DIAGNOSIS — Z7952 Long term (current) use of systemic steroids: Secondary | ICD-10-CM | POA: Diagnosis not present

## 2019-11-13 DIAGNOSIS — M1990 Unspecified osteoarthritis, unspecified site: Secondary | ICD-10-CM | POA: Diagnosis not present

## 2019-11-13 DIAGNOSIS — G4733 Obstructive sleep apnea (adult) (pediatric): Secondary | ICD-10-CM | POA: Diagnosis not present

## 2019-11-13 DIAGNOSIS — Z79899 Other long term (current) drug therapy: Secondary | ICD-10-CM | POA: Insufficient documentation

## 2019-11-13 DIAGNOSIS — I10 Essential (primary) hypertension: Secondary | ICD-10-CM | POA: Diagnosis not present

## 2019-11-13 DIAGNOSIS — K219 Gastro-esophageal reflux disease without esophagitis: Secondary | ICD-10-CM | POA: Insufficient documentation

## 2019-11-13 DIAGNOSIS — J398 Other specified diseases of upper respiratory tract: Secondary | ICD-10-CM | POA: Insufficient documentation

## 2019-11-13 DIAGNOSIS — Z7901 Long term (current) use of anticoagulants: Secondary | ICD-10-CM | POA: Diagnosis not present

## 2019-11-13 MED ORDER — METOPROLOL TARTRATE 25 MG PO TABS: 75.0000 mg | ORAL_TABLET | Freq: Two times a day (BID) | ORAL | 3 refills | Status: DC

## 2019-11-13 NOTE — Progress Notes (Signed)
Primary Care Physician: Javier Glazier, MD Referring Physician: [name] is a 86 y.o. female with a h/o CAD, HTN, afib, SSS s/p PPM, in the afib clinic for f/u. When she saw Lynn Dunn in May, he suggested that she stop amiodarone since her Afib was well controlled and pt overall was c/o of feeling poorly. She has chronic shortness of breath 2/2 tracheomalacia as well as other multifactorial reasons. She was off amiodarone x one week and started having episodes of afib. She restarted drug.  She continues on warfarin for a CHA2DS2VASc score of at least 8. Her amiodarone was stopped 04/2019 and she does admit her SOB is mildly improved since stopping the medication.  On follow up today, patient felt she was back in afib 11/12/19 with symptoms of palpitations and heart racing. She took an extra dose of BB which slowed her heart rate. She felt she was back in SR earlier today. ECG shows atrial paced rhythm.   Today, she denies symptoms of chest pain, orthopnea, PND, lower extremity edema, dizziness, presyncope, syncope, or neurologic sequela. The patient is tolerating medications without difficulties and is otherwise without complaint today.   Past Medical History:  Diagnosis Date  . Airway malacia   . Anemia   . Anginal pain ([place]) 2011   had to take nitro   . Arthritis   . COPD (chronic obstructive pulmonary disease) ([place])   . Dysuria   . GERD (gastroesophageal reflux disease)   . Hematuria   . HTN (hypertension)   . Hx of echocardiogram    Echo (12/15):  Mild LVH, EF 60-65%, mild MR, mod LAE (LA 49 mm), PASP 43 mmHg  . Obesity   . OSA (obstructive sleep apnea)    cpap at home  . PAF (paroxysmal atrial fibrillation) ([place])    s/p ablation 07/29/09  . Sick sinus syndrome ([facility])    pauses with syncope  . Stroke ([place]) 6/09   NO RESIDUAL PROBLEMS   Past Surgical History:  Procedure Laterality Date  . ABDOMINAL HYSTERECTOMY    . ATRIAL ABLATION SURGERY     s/p  ablation for afib 07/29/09  . CYSTOSCOPY N/A 06/23/2015   Procedure: CYSTOSCOPY;  Surgeon: Alexis Frock, MD;  Location: [facility];  Service: Urology;  Laterality: N/A;  . EP IMPLANTABLE DEVICE N/A 02/17/2015   MDT Advisa MRI PPM implanted by Dr Lovena Le for sick sinus and syncope  . EP IMPLANTABLE DEVICE N/A 02/17/2015   Procedure: Loop Recorder Removal;  Surgeon: Evans Lance, MD;  Location: [place] CV LAB;  Service: Cardiovascular;  Laterality: N/A;  . ESOPHAGOGASTRODUODENOSCOPY  01/24/2012   Procedure: ESOPHAGOGASTRODUODENOSCOPY (EGD);  Surgeon: Jeryl Columbia, MD;  Location: [facility] ENDOSCOPY;  Service: Endoscopy;  Laterality: N/A;  . KNEE ARTHROSCOPY     BILATERAL  . LOOP RECORDER IMPLANT N/A 03/12/2014   Procedure: LOOP RECORDER IMPLANT;  Surgeon: Thompson Grayer, MD;  Location: [facility] CATH LAB;  Service: Cardiovascular;  Laterality: N/A;  . LYSIS OF ADHESION N/A 06/23/2015   Procedure: LYSIS OF LABIAL ADHESION;  Surgeon: Alexis Frock, MD;  Location: [facility];  Service: Urology;  Laterality: N/A;  . VIDEO BRONCHOSCOPY Bilateral 12/15/2016   Procedure: VIDEO BRONCHOSCOPY WITHOUT FLUORO;  Surgeon: Collene Gobble, MD;  Location: [facility] ENDOSCOPY;  Service: Cardiopulmonary;  Laterality: Bilateral;    Current Outpatient Medications  Medication Sig Dispense Refill  . calcitRIOL (ROCALTROL) 0.25 MCG capsule Take 0.25 mcg by mouth at bedtime.     Marland Kitchen  Coenzyme Q10 (COQ10 PO) Take 1 capsule by mouth at bedtime.     Marland Kitchen estradiol (ESTRACE VAGINAL) 0.1 MG/GM vaginal cream Apply pea-sized amount to vagina / urethra 2-3 x weekly to prevent labial adhesions (Patient taking differently: Place vaginally See admin instructions. Apply a pea-sized amount to vagina/urethra 2-3 times a week to prevent labial adhesions) 42.5 g 12  . furosemide (LASIX) 40 MG tablet Take 120 mg by mouth daily. Take 3 tablets by mouth daily.    Marland Kitchen levothyroxine (SYNTHROID, LEVOTHROID) 50 MCG tablet Take 50 mcg by mouth daily before breakfast.     .  Magnesium 100 MG CAPS Take 100 mg by mouth 2 (two) times daily.     . metoprolol tartrate (LOPRESSOR) 25 MG tablet Take 2 tablets (50 mg total) by mouth 2 (two) times daily. Take 50 mg by mouth 2 (two) times daily. (Patient taking differently: Take 75 mg by mouth 2 (two) times daily. Take 75 mg by mouth 2 (two) times daily.) 360 tablet 3  . potassium chloride SA (K-DUR,KLOR-CON) 20 MEQ tablet Take 20 mEq by mouth 3 (three) times a week.     . predniSONE 5 MG TBEC Take 5 mg by mouth daily. 30 tablet 5  . rosuvastatin (CRESTOR) 10 MG tablet Take 10 mg by mouth at bedtime.     . Turmeric 500 MG CAPS Take 500 mg by mouth 2 (two) times daily.    Marland Kitchen warfarin (COUMADIN) 3 MG tablet Take 1.5-3 mg by mouth See admin instructions. Take 1.5 mg by mouth in the evening on Sun/Wed and 3 mg on Mon/Tues/Thurs/Fri/Sat     No current facility-administered medications for this encounter.    Allergies  Allergen Reactions  . Ace Inhibitors Cough  . Albuterol Palpitations and Other (See Comments)    Atrial fibrillation   . Albuterol Sulfate Palpitations and Other (See Comments)    Causes A-Fib  . Oysters [Shellfish Allergy] Nausea And Vomiting  . Sulfa Antibiotics Rash    Social History   Socioeconomic History  . Marital status: Widowed    Spouse name: Not on file  . Number of children: Not on file  . Years of education: Not on file  . Highest education level: Not on file  Occupational History  . Not on file  Tobacco Use  . Smoking status: Never Smoker  . Smokeless tobacco: Never Used  Vaping Use  . Vaping Use: Never used  Substance and Sexual Activity  . Alcohol use: Yes    Alcohol/week: 2.0 standard drinks    Types: 2 Glasses of wine per week    Comment: occassionally  . Drug use: No  . Sexual activity: Not on file  Other Topics Concern  . Not on file  Social History Narrative  . Not on file   Social Determinants of Health   Financial Resource Strain:   . Difficulty of Paying Living  Expenses: Not on file  Food Insecurity:   . Worried About Charity fundraiser in the Last Year: Not on file  . Ran Out of Food in the Last Year: Not on file  Transportation Needs:   . Lack of Transportation (Medical): Not on file  . Lack of Transportation (Non-Medical): Not on file  Physical Activity:   . Days of Exercise per Week: Not on file  . Minutes of Exercise per Session: Not on file  Stress:   . Feeling of Stress : Not on file  Social Connections:   . Frequency of  Communication with Friends and Family: Not on file  . Frequency of Social Gatherings with Friends and Family: Not on file  . Attends Religious Services: Not on file  . Active Member of Clubs or Organizations: Not on file  . Attends Archivist Meetings: Not on file  . Marital Status: Not on file  Intimate Partner Violence:   . Fear of Current or Ex-Partner: Not on file  . Emotionally Abused: Not on file  . Physically Abused: Not on file  . Sexually Abused: Not on file    Family History  Problem Relation Age of Onset  . Breast cancer Mother   . Brain cancer Father   . Breast cancer Other   . Brain cancer Other   . Stroke Maternal Aunt   . Hypertension Sister   . Heart attack Neg Hx     ROS- All systems are reviewed and negative except as per the HPI above  Physical Exam: Vitals:   11/13/19 1324  BP: 128/70  Pulse: 74  Weight: 97.7 kg  Height: 5' (1.524 m)   Wt Readings from Last 3 Encounters:  11/13/19 97.7 kg  06/23/19 93 kg  06/16/19 91.2 kg    Labs: Lab Results  Component Value Date   NA 138 01/15/2019   K 4.2 01/15/2019   CL 98 01/15/2019   CO2 28 01/15/2019   GLUCOSE 122 (H) 01/15/2019   BUN 30 (H) 01/15/2019   CREATININE 1.60 (H) 01/15/2019   CALCIUM 9.3 01/15/2019   PHOS 4.3 09/05/2007   MG 2.4 01/10/2019   Lab Results  Component Value Date   INR 1.5 (H) 01/10/2019   Lab Results  Component Value Date   CHOL 170 08/31/2014   HDL 51.80 08/31/2014   LDLCALC 86  08/31/2014   TRIG 162.0 (H) 08/31/2014    GEN- The patient is well appearing obese elderly female, alert and oriented x 3 today.   HEENT-head normocephalic, atraumatic, sclera clear, conjunctiva pink, hearing intact, trachea midline. Lungs- Clear to ausculation bilaterally, normal work of breathing Heart- Regular rate and rhythm, no murmurs, rubs or gallops  GI- soft, NT, ND, + BS Extremities- no clubbing, cyanosis, or edema MS- no significant deformity or atrophy Skin- no rash or lesion Psych- euthymic mood, full affect Neuro- strength and sensation are intact   EKG- A paced rhythm HR 74, PR 234, QRS 86, QTc 437    Assessment and Plan: 1. Persistent Afib Patient back in SR today. Has actually done well off AAD. Continue metoprolol 75 mg BID. May take an extra 25 mg PRN for heart racing. Continue warfarin  2. CHA2DS2VASc score of at least 8 Continue wafarin  3. HTN Stable, no changes today.  4. SSS S/p PPM, followed by Dr Rayann Heman and the device clinic.   Follow up with Dr Rayann Heman in 3 months.    [facility] [address] [place], [place] [address] [phone]

## 2019-11-19 ENCOUNTER — Other Ambulatory Visit: Payer: Self-pay | Admitting: Interventional Cardiology

## 2019-11-20 NOTE — Telephone Encounter (Signed)
This is a A-Fib clinic pt, medication was prescribed, but it was on print. Please address

## 2019-12-02 ENCOUNTER — Telehealth: Payer: Self-pay | Admitting: Internal Medicine

## 2019-12-02 NOTE — Telephone Encounter (Signed)
Patient c/o Palpitations:  High priority if patient c/o lightheadedness, shortness of breath, or chest pain  1) How long have you had palpitations/irregular HR/ Afib? Are you having the symptoms now? A couple of days, yes  2) Are you currently experiencing lightheadedness, SOB or CP? Headache, some SOB when moving   3) Do you have a history of afib (atrial fibrillation) or irregular heart rhythm? yes  4) Have you checked your BP or HR? (document readings if available): HR 124  5) Are you experiencing any other symptoms? no

## 2019-12-02 NOTE — Telephone Encounter (Signed)
Patient is in and out of atrial fibrillation for the past week. Heart rate is currently ~120; BP is 140/103. Patient is taking 100 mg twice a day of metoprolol tartrate. Ordered amount is 75 mg BID. She has had occasional dizziness.   Will forward for advisement to Butch Penny and Marzetta Board at the Afib clinic.

## 2019-12-02 NOTE — Telephone Encounter (Signed)
Upon further review patient saw Clint most recently in the Atrial fibrillation clinic. She has been made an appointment tomorrow at 2 pm with Clint. The patient is aware.

## 2019-12-03 ENCOUNTER — Other Ambulatory Visit: Payer: Self-pay

## 2019-12-03 ENCOUNTER — Ambulatory Visit (HOSPITAL_COMMUNITY)
Admission: RE | Admit: 2019-12-03 | Discharge: 2019-12-03 | Disposition: A | Discharge status: Home / Self Care | Payer: Medicare Other | Source: Ambulatory Visit | Attending: Physician Assistant | Admitting: Physician Assistant

## 2019-12-03 VITALS — BP 158/84 | HR 102 | Ht 60.0 in | Wt 207.6 lb

## 2019-12-03 DIAGNOSIS — K219 Gastro-esophageal reflux disease without esophagitis: Secondary | ICD-10-CM | POA: Insufficient documentation

## 2019-12-03 DIAGNOSIS — I495 Sick sinus syndrome: Secondary | ICD-10-CM | POA: Insufficient documentation

## 2019-12-03 DIAGNOSIS — I4892 Unspecified atrial flutter: Secondary | ICD-10-CM | POA: Insufficient documentation

## 2019-12-03 DIAGNOSIS — Z7952 Long term (current) use of systemic steroids: Secondary | ICD-10-CM | POA: Diagnosis not present

## 2019-12-03 DIAGNOSIS — Z7901 Long term (current) use of anticoagulants: Secondary | ICD-10-CM | POA: Insufficient documentation

## 2019-12-03 DIAGNOSIS — I4439 Other atrioventricular block: Secondary | ICD-10-CM | POA: Diagnosis not present

## 2019-12-03 DIAGNOSIS — Z6841 Body Mass Index (BMI) 40.0 and over, adult: Secondary | ICD-10-CM | POA: Insufficient documentation

## 2019-12-03 DIAGNOSIS — I10 Essential (primary) hypertension: Secondary | ICD-10-CM | POA: Insufficient documentation

## 2019-12-03 DIAGNOSIS — I2510 Atherosclerotic heart disease of native coronary artery without angina pectoris: Secondary | ICD-10-CM | POA: Diagnosis not present

## 2019-12-03 DIAGNOSIS — G4733 Obstructive sleep apnea (adult) (pediatric): Secondary | ICD-10-CM | POA: Insufficient documentation

## 2019-12-03 DIAGNOSIS — Z79890 Hormone replacement therapy (postmenopausal): Secondary | ICD-10-CM | POA: Insufficient documentation

## 2019-12-03 DIAGNOSIS — Z79899 Other long term (current) drug therapy: Secondary | ICD-10-CM | POA: Insufficient documentation

## 2019-12-03 DIAGNOSIS — D6869 Other thrombophilia: Secondary | ICD-10-CM | POA: Diagnosis not present

## 2019-12-03 DIAGNOSIS — J449 Chronic obstructive pulmonary disease, unspecified: Secondary | ICD-10-CM | POA: Insufficient documentation

## 2019-12-03 DIAGNOSIS — E669 Obesity, unspecified: Secondary | ICD-10-CM | POA: Diagnosis not present

## 2019-12-03 DIAGNOSIS — I4819 Other persistent atrial fibrillation: Secondary | ICD-10-CM | POA: Insufficient documentation

## 2019-12-03 MED ORDER — DILTIAZEM HCL ER COATED BEADS 120 MG PO CP24: 120.0000 mg | ORAL_CAPSULE | Freq: Every day | ORAL | 0 refills | Status: DC

## 2019-12-03 NOTE — Progress Notes (Signed)
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 female with a h/o CAD, HTN, afib, SSS s/p PPM, in the afib clinic for f/u. When she saw Dr. Ky Barban in May, he suggested that she stop amiodarone since her Afib was well controlled and pt overall was c/o of feeling poorly. She has chronic shortness of breath 2/2 tracheomalacia as well as other multifactorial reasons. She was off amiodarone x one week and started having episodes of afib. She restarted drug.  She continues on warfarin for a CHA2DS2VASc score of at least 8. Her amiodarone was stopped 04/2019 and she does admit her SOB is mildly improved since stopping the medication.  On follow up today, patient reports that she felt she was back in afib last week with symptoms of fatigue, heart racing, and worsening SOB. She saw her PCP who increased her BB. This has helped slow her heart rate down. There were no specific triggers that she could identify. She denies symptoms of fluid overload.   Today, she denies symptoms of chest pain, orthopnea, PND, lower extremity edema, dizziness, presyncope, syncope, or neurologic sequela. The patient is tolerating medications without difficulties and is otherwise without complaint today.   Past Medical History:  Diagnosis Date  . Airway malacia   . Anemia   . Anginal pain ([place]) 2011   had to take nitro   . Arthritis   . COPD (chronic obstructive pulmonary disease) ([place])   . Dysuria   . GERD (gastroesophageal reflux disease)   . Hematuria   . HTN (hypertension)   . Hx of echocardiogram    Echo (12/15):  Mild LVH, EF 60-65%, mild MR, mod LAE (LA 49 mm), PASP 43 mmHg  . Obesity   . OSA (obstructive sleep apnea)    cpap at home  . PAF (paroxysmal atrial fibrillation) ([place])    s/p ablation 07/29/09  . Sick sinus syndrome ([facility])    pauses with syncope  . Stroke ([place]) 6/09   NO RESIDUAL PROBLEMS   Past Surgical History:  Procedure Laterality  Date  . ABDOMINAL HYSTERECTOMY    . ATRIAL ABLATION SURGERY     s/p ablation for afib 07/29/09  . CYSTOSCOPY N/A 06/23/2015   Procedure: CYSTOSCOPY;  Surgeon: Alexis Frock, MD;  [facility]: [facility];  Service: Urology;  Laterality: N/A;  . EP IMPLANTABLE DEVICE N/A 02/17/2015   MDT Advisa MRI PPM implanted by Dr Lovena Le for sick sinus and syncope  . EP IMPLANTABLE DEVICE N/A 02/17/2015   Procedure: Loop Recorder Removal;  Surgeon: Evans Lance, MD;  Location: [place] CV LAB;  Service: Cardiovascular;  Laterality: N/A;  . ESOPHAGOGASTRODUODENOSCOPY  01/24/2012   Procedure: ESOPHAGOGASTRODUODENOSCOPY (EGD);  Surgeon: Jeryl Columbia, MD;  Location: [facility] ENDOSCOPY;  Service: Endoscopy;  Laterality: N/A;  . KNEE ARTHROSCOPY     BILATERAL  . LOOP RECORDER IMPLANT N/A 03/12/2014   Procedure: LOOP RECORDER IMPLANT;  Surgeon: Thompson Grayer, MD;  Location: [facility] CATH LAB;  Service: Cardiovascular;  Laterality: N/A;  . LYSIS OF ADHESION N/A 06/23/2015   Procedure: LYSIS OF LABIAL ADHESION;  Surgeon: Alexis Frock, MD;  [facility]: [facility];  Service: Urology;  Laterality: N/A;  . VIDEO BRONCHOSCOPY Bilateral 12/15/2016   Procedure: VIDEO BRONCHOSCOPY WITHOUT FLUORO;  Surgeon: Collene Gobble, MD;  Location: [facility] ENDOSCOPY;  Service: Cardiopulmonary;  Laterality: Bilateral;    Current Outpatient Medications  Medication Sig Dispense Refill  . calcitRIOL (ROCALTROL) 0.25 MCG  capsule Take 0.25 mcg by mouth daily.     Marland Kitchen estradiol (ESTRACE VAGINAL) 0.1 MG/GM vaginal cream Apply pea-sized amount to vagina / urethra 2-3 x weekly to prevent labial adhesions (Patient taking differently: Place vaginally See admin instructions. Apply a pea-sized amount to vagina/urethra 2-3 times a week to prevent labial adhesions) 42.5 g 12  . furosemide (LASIX) 40 MG tablet Take 120 mg by mouth daily.     Marland Kitchen HYDROcodone-acetaminophen (NORCO/VICODIN) 5-325 MG tablet Take 1 tablet by mouth daily.     Marland Kitchen levothyroxine (SYNTHROID, LEVOTHROID) 50  MCG tablet Take 50 mcg by mouth daily before breakfast.     . Magnesium 100 MG CAPS Take 100 mg by mouth 2 (two) times daily.     . metoprolol tartrate (LOPRESSOR) 25 MG tablet Taking 4 tablets by mouth twice daily    . potassium chloride SA (K-DUR,KLOR-CON) 20 MEQ tablet Take 20 mEq by mouth daily.     . predniSONE 5 MG TBEC Take 5 mg by mouth daily. 30 tablet 5  . rosuvastatin (CRESTOR) 10 MG tablet Take 10 mg by mouth at bedtime.     . Turmeric 500 MG CAPS Take 500 mg by mouth 2 (two) times daily.    Marland Kitchen warfarin (COUMADIN) 3 MG tablet Take 1.5-3 mg by mouth See admin instructions. Take 1.5 mg by mouth in the evening on Sun/Wed and 3 mg on Mon/Tues/Thurs/Fri/Sat    . Coenzyme Q10 (COQ10 PO) Take 1 capsule by mouth at bedtime.  (Patient not taking: Reported on 12/03/2019)    . diltiazem (CARDIZEM CD) 120 MG 24 hr capsule Take 1 capsule (120 mg total) by mouth daily. 30 capsule 0   No current facility-administered medications for this encounter.    Allergies  Allergen Reactions  . Ace Inhibitors Cough  . Albuterol Palpitations and Other (See Comments)    Atrial fibrillation   . Albuterol Sulfate Palpitations and Other (See Comments)    Causes A-Fib  . Oysters [Shellfish Allergy] Nausea And Vomiting  . Sulfa Antibiotics Rash    Social History   Socioeconomic History  . Marital status: Widowed    Spouse name: Not on file  . Number of children: Not on file  . Years of education: Not on file  . Highest education level: Not on file  Occupational History  . Not on file  Tobacco Use  . Smoking status: Never Smoker  . Smokeless tobacco: Never Used  Vaping Use  . Vaping Use: Never used  Substance and Sexual Activity  . Alcohol use: Yes    Alcohol/week: 2.0 standard drinks    Types: 2 Glasses of wine per week    Comment: occassionally  . Drug use: No  . Sexual activity: Not on file  Other Topics Concern  . Not on file  Social History Narrative  . Not on file   Social  Determinants of Health   Financial Resource Strain:   . Difficulty of Paying Living Expenses: Not on file  Food Insecurity:   . Worried About Charity fundraiser in the Last Year: Not on file  . Ran Out of Food in the Last Year: Not on file  Transportation Needs:   . Lack of Transportation (Medical): Not on file  . Lack of Transportation (Non-Medical): Not on file  Physical Activity:   . Days of Exercise per Week: Not on file  . Minutes of Exercise per Session: Not on file  Stress:   . Feeling of Stress :  Not on file  Social Connections:   . Frequency of Communication with Friends and Family: Not on file  . Frequency of Social Gatherings with Friends and Family: Not on file  . Attends Religious Services: Not on file  . Active Member of Clubs or Organizations: Not on file  . Attends Archivist Meetings: Not on file  . Marital Status: Not on file  Intimate Partner Violence:   . Fear of Current or Ex-Partner: Not on file  . Emotionally Abused: Not on file  . Physically Abused: Not on file  . Sexually Abused: Not on file    Family History  Problem Relation Age of Onset  . Breast cancer Mother   . Brain cancer Father   . Breast cancer Other   . Brain cancer Other   . Stroke Maternal Aunt   . Hypertension Sister   . Heart attack Neg Hx     ROS- All systems are reviewed and negative except as per the HPI above  Physical Exam: Vitals:   12/03/19 1348  BP: (!) 158/84  Pulse: (!) 102  Weight: 94.2 kg  Height: 5' (1.524 m)   Wt Readings from Last 3 Encounters:  12/03/19 94.2 kg  11/13/19 97.7 kg  06/23/19 93 kg    Labs: Lab Results  Component Value Date   NA 138 01/15/2019   K 4.2 01/15/2019   CL 98 01/15/2019   CO2 28 01/15/2019   GLUCOSE 122 (H) 01/15/2019   BUN 30 (H) 01/15/2019   CREATININE 1.60 (H) 01/15/2019   CALCIUM 9.3 01/15/2019   PHOS 4.3 09/05/2007   MG 2.4 01/10/2019   Lab Results  Component Value Date   INR 1.5 (H) 01/10/2019    Lab Results  Component Value Date   CHOL 170 08/31/2014   HDL 51.80 08/31/2014   LDLCALC 86 08/31/2014   TRIG 162.0 (H) 08/31/2014    GEN- The patient is well appearing obese elderly female, alert and oriented x 3 today.   HEENT-head normocephalic, atraumatic, sclera clear, conjunctiva pink, hearing intact, trachea midline. Lungs- Clear to ausculation bilaterally, normal work of breathing Heart- irregular rate and rhythm, no murmurs, rubs or gallops  GI- soft, NT, ND, + BS Extremities- no clubbing, cyanosis, or edema MS- no significant deformity or atrophy Skin- no rash or lesion Psych- euthymic mood, full affect Neuro- strength and sensation are intact   EKG- atypical atrial flutter with variable block HR 102, QRS 84, QTc 458   Assessment and Plan: 1. Persistent Afib Patient back in afib with elevated heart rates. We discussed therapeutic options today including increasing rate control, dofetilide, and AV node ablation. Will start diltiazem 120 mg daily for better rate control. If her symptoms of fatigue and SOB don't improve with rate control, will consider dofetilide admission.  Continue metoprolol 100 mg BID. Continue warfarin  2. CHA2DS2VASc score of at least 8 Continue wafarin  3. HTN Stable, med changes as above.  4. SSS S/p PPM, followed by Dr Rayann Heman and the device clinic.   Follow up in the AF clinic in one week.    [facility] [address] [place], [place] [address] [phone]

## 2019-12-03 NOTE — Patient Instructions (Signed)
Start Diltiazem 120mg  - Take one tablet by mouth daily

## 2019-12-10 ENCOUNTER — Ambulatory Visit (HOSPITAL_COMMUNITY)
Admission: RE | Admit: 2019-12-10 | Discharge: 2019-12-10 | Disposition: A | Discharge status: Home / Self Care | Payer: Medicare Other | Source: Ambulatory Visit | Attending: Physician Assistant | Admitting: Physician Assistant

## 2019-12-10 ENCOUNTER — Other Ambulatory Visit: Payer: Self-pay

## 2019-12-10 ENCOUNTER — Encounter (HOSPITAL_COMMUNITY): Payer: Self-pay | Admitting: Physician Assistant

## 2019-12-10 VITALS — BP 134/70 | HR 71 | Ht 60.0 in | Wt 209.8 lb

## 2019-12-10 DIAGNOSIS — Z7952 Long term (current) use of systemic steroids: Secondary | ICD-10-CM | POA: Insufficient documentation

## 2019-12-10 DIAGNOSIS — D6869 Other thrombophilia: Secondary | ICD-10-CM

## 2019-12-10 DIAGNOSIS — G4733 Obstructive sleep apnea (adult) (pediatric): Secondary | ICD-10-CM | POA: Insufficient documentation

## 2019-12-10 DIAGNOSIS — Z79899 Other long term (current) drug therapy: Secondary | ICD-10-CM | POA: Insufficient documentation

## 2019-12-10 DIAGNOSIS — Z7901 Long term (current) use of anticoagulants: Secondary | ICD-10-CM | POA: Diagnosis not present

## 2019-12-10 DIAGNOSIS — Z79890 Hormone replacement therapy (postmenopausal): Secondary | ICD-10-CM | POA: Diagnosis not present

## 2019-12-10 DIAGNOSIS — I495 Sick sinus syndrome: Secondary | ICD-10-CM | POA: Insufficient documentation

## 2019-12-10 DIAGNOSIS — Z9989 Dependence on other enabling machines and devices: Secondary | ICD-10-CM | POA: Insufficient documentation

## 2019-12-10 DIAGNOSIS — I2510 Atherosclerotic heart disease of native coronary artery without angina pectoris: Secondary | ICD-10-CM | POA: Insufficient documentation

## 2019-12-10 DIAGNOSIS — E669 Obesity, unspecified: Secondary | ICD-10-CM | POA: Insufficient documentation

## 2019-12-10 DIAGNOSIS — I4819 Other persistent atrial fibrillation: Secondary | ICD-10-CM

## 2019-12-10 DIAGNOSIS — J449 Chronic obstructive pulmonary disease, unspecified: Secondary | ICD-10-CM | POA: Diagnosis not present

## 2019-12-10 DIAGNOSIS — J398 Other specified diseases of upper respiratory tract: Secondary | ICD-10-CM | POA: Diagnosis not present

## 2019-12-10 DIAGNOSIS — Z8673 Personal history of transient ischemic attack (TIA), and cerebral infarction without residual deficits: Secondary | ICD-10-CM | POA: Insufficient documentation

## 2019-12-10 DIAGNOSIS — Z6841 Body Mass Index (BMI) 40.0 and over, adult: Secondary | ICD-10-CM | POA: Insufficient documentation

## 2019-12-10 DIAGNOSIS — M1990 Unspecified osteoarthritis, unspecified site: Secondary | ICD-10-CM | POA: Diagnosis not present

## 2019-12-10 DIAGNOSIS — I10 Essential (primary) hypertension: Secondary | ICD-10-CM | POA: Diagnosis not present

## 2019-12-10 DIAGNOSIS — Z950 Presence of cardiac pacemaker: Secondary | ICD-10-CM | POA: Insufficient documentation

## 2019-12-10 NOTE — Progress Notes (Signed)
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 female with a h/o CAD, HTN, afib, SSS s/p PPM, in the afib clinic for f/u. When she saw Dr. Ky Barban in May, he suggested that she stop amiodarone since her Afib was well controlled and pt overall was c/o of feeling poorly. She has chronic shortness of breath 2/2 tracheomalacia as well as other multifactorial reasons. She was off amiodarone x one week and started having episodes of afib. She restarted drug.  She continues on warfarin for a CHA2DS2VASc score of at least 8. Her amiodarone was stopped 04/2019 and she does admit her SOB is mildly improved since stopping the medication.  Patient reports that she felt she was back in afib the week of 11/24/19 symptoms of fatigue, heart racing, and worsening SOB. She saw her PCP who increased her BB. This has helped slow her heart rate down but she is still seeing heart rates >100 bpm. There were no specific triggers that she could identify.  On follow up today, patient felt she was back in [place] 12/09/19 with resolution of her symptoms of heart racing. She does still feel fatigued but she admits that it takes her some time to recover from having afib. Her SOB is at baseline. Of note, her Cr was elevated at 1.8 at her PCP office visit 11/28/19 and she has a nephrology consult pending.   Today, she denies symptoms of palpitations, chest pain, orthopnea, PND, lower extremity edema, dizziness, presyncope, syncope, or neurologic sequela. The patient is tolerating medications without difficulties and is otherwise without complaint today.   Past Medical History:  Diagnosis Date  . Airway malacia   . Anemia   . Anginal pain ([place]) 2011   had to take nitro   . Arthritis   . COPD (chronic obstructive pulmonary disease) ([place])   . Dysuria   . GERD (gastroesophageal reflux disease)   . Hematuria   . HTN (hypertension)   . Hx of echocardiogram    Echo (12/15):   Mild LVH, EF 60-65%, mild MR, mod LAE (LA 49 mm), PASP 43 mmHg  . Obesity   . OSA (obstructive sleep apnea)    cpap at home  . PAF (paroxysmal atrial fibrillation) ([place])    s/p ablation 07/29/09  . Sick sinus syndrome ([facility])    pauses with syncope  . Stroke ([place]) 6/09   NO RESIDUAL PROBLEMS   Past Surgical History:  Procedure Laterality Date  . ABDOMINAL HYSTERECTOMY    . ATRIAL ABLATION SURGERY     s/p ablation for afib 07/29/09  . CYSTOSCOPY N/A 06/23/2015   Procedure: CYSTOSCOPY;  Surgeon: Alexis Frock, MD;  Location: [facility];  Service: Urology;  Laterality: N/A;  . EP IMPLANTABLE DEVICE N/A 02/17/2015   MDT Advisa MRI PPM implanted by Dr Lovena Le for sick sinus and syncope  . EP IMPLANTABLE DEVICE N/A 02/17/2015   Procedure: Loop Recorder Removal;  Surgeon: Evans Lance, MD;  Location: [facility];  Service: Cardiovascular;  Laterality: N/A;  . ESOPHAGOGASTRODUODENOSCOPY  01/24/2012   Procedure: ESOPHAGOGASTRODUODENOSCOPY (EGD);  Surgeon: Jeryl Columbia, MD;  Location: [facility] ENDOSCOPY;  Service: Endoscopy;  Laterality: N/A;  . KNEE ARTHROSCOPY     BILATERAL  . LOOP RECORDER IMPLANT N/A 03/12/2014   Procedure: LOOP RECORDER IMPLANT;  Surgeon: Thompson Grayer, MD;  Location: [facility] CATH LAB;  Service: Cardiovascular;  Laterality: N/A;  . LYSIS OF ADHESION N/A 06/23/2015  Procedure: LYSIS OF LABIAL ADHESION;  Surgeon: Alexis Frock, MD;  Location: [facility];  Service: Urology;  Laterality: N/A;  . VIDEO BRONCHOSCOPY Bilateral 12/15/2016   Procedure: VIDEO BRONCHOSCOPY WITHOUT FLUORO;  Surgeon: Collene Gobble, MD;  [facility]OS[facility];  Service: Cardiopulmonary;  Laterality: Bilateral;    Current Outpatient Medications  Medication Sig Dispense Refill  . calcitRIOL (ROCALTROL) 0.25 MCG capsule Take 0.25 mcg by mouth daily.     . Coenzyme Q10 (COQ10 PO) Take 1 capsule by mouth at bedtime.     Marland Kitchen diltiazem (CARDIZEM CD) 120 MG 24 hr capsule Take 1 capsule (120 mg total) by mouth daily. 30  capsule 0  . estradiol (ESTRACE VAGINAL) 0.1 MG/GM vaginal cream Apply pea-sized amount to vagina / urethra 2-3 x weekly to prevent labial adhesions (Patient taking differently: Place vaginally See admin instructions. Apply a pea-sized amount to vagina/urethra 2-3 times a week to prevent labial adhesions) 42.5 g 12  . furosemide (LASIX) 40 MG tablet Take 120 mg by mouth daily.     Marland Kitchen levothyroxine (SYNTHROID, LEVOTHROID) 50 MCG tablet Take 50 mcg by mouth daily before breakfast.     . Magnesium 100 MG CAPS Take 100 mg by mouth 2 (two) times daily.     . metoprolol tartrate (LOPRESSOR) 25 MG tablet Taking 4 tablets by mouth twice daily    . potassium chloride SA (K-DUR,KLOR-CON) 20 MEQ tablet Take 20 mEq by mouth daily.     . predniSONE 5 MG TBEC Take 5 mg by mouth daily. 30 tablet 5  . rosuvastatin (CRESTOR) 10 MG tablet Take 10 mg by mouth at bedtime.     . Turmeric 500 MG CAPS Take 500 mg by mouth 2 (two) times daily.    Marland Kitchen warfarin (COUMADIN) 3 MG tablet Take 1.5-3 mg by mouth See admin instructions. Take 1.5 mg by mouth in the evening on Sun/Wed and 3 mg on Mon/Tues/Thurs/Fri/Sat     No current facility-administered medications for this encounter.    Allergies  Allergen Reactions  . Ace Inhibitors Cough  . Albuterol Palpitations and Other (See Comments)    Atrial fibrillation   . Albuterol Sulfate Palpitations and Other (See Comments)    Causes A-Fib  . Oysters [Shellfish Allergy] Nausea And Vomiting  . Sulfa Antibiotics Rash    Social History   Socioeconomic History  . Marital status: Widowed    Spouse name: Not on file  . Number of children: Not on file  . Years of education: Not on file  . Highest education level: Not on file  Occupational History  . Not on file  Tobacco Use  . Smoking status: Never Smoker  . Smokeless tobacco: Never Used  Vaping Use  . Vaping Use: Never used  Substance and Sexual Activity  . Alcohol use: Yes    Alcohol/week: 2.0 standard drinks     Types: 2 Glasses of wine per week    Comment: occassionally  . Drug use: No  . Sexual activity: Not on file  Other Topics Concern  . Not on file  Social History Narrative  . Not on file   Social Determinants of Health   Financial Resource Strain:   . Difficulty of Paying Living Expenses: Not on file  Food Insecurity:   . Worried About Charity fundraiser in the Last Year: Not on file  . Ran Out of Food in the Last Year: Not on file  Transportation Needs:   . Lack of Transportation (Medical): Not  on file  . Lack of Transportation (Non-Medical): Not on file  Physical Activity:   . Days of Exercise per Week: Not on file  . Minutes of Exercise per Session: Not on file  Stress:   . Feeling of Stress : Not on file  Social Connections:   . Frequency of Communication with Friends and Family: Not on file  . Frequency of Social Gatherings with Friends and Family: Not on file  . Attends Religious Services: Not on file  . Active Member of Clubs or Organizations: Not on file  . Attends Archivist Meetings: Not on file  . Marital Status: Not on file  Intimate Partner Violence:   . Fear of Current or Ex-Partner: Not on file  . Emotionally Abused: Not on file  . Physically Abused: Not on file  . Sexually Abused: Not on file    Family History  Problem Relation Age of Onset  . Breast cancer Mother   . Brain cancer Father   . Breast cancer Other   . Brain cancer Other   . Stroke Maternal Aunt   . Hypertension Sister   . Heart attack Neg Hx     ROS- All systems are reviewed and negative except as per the HPI above  Physical Exam: Vitals:   12/10/19 1318  BP: 134/70  Pulse: 71  Weight: 95.2 kg  Height: 5' (1.524 m)   Wt Readings from Last 3 Encounters:  12/10/19 95.2 kg  12/03/19 94.2 kg  11/13/19 97.7 kg    Labs: Lab Results  Component Value Date   NA 138 01/15/2019   K 4.2 01/15/2019   CL 98 01/15/2019   CO2 28 01/15/2019   GLUCOSE 122 (H) 01/15/2019    BUN 30 (H) 01/15/2019   CREATININE 1.60 (H) 01/15/2019   CALCIUM 9.3 01/15/2019   PHOS 4.3 09/05/2007   MG 2.4 01/10/2019   Lab Results  Component Value Date   INR 1.5 (H) 01/10/2019   Lab Results  Component Value Date   CHOL 170 08/31/2014   HDL 51.80 08/31/2014   LDLCALC 86 08/31/2014   TRIG 162.0 (H) 08/31/2014    GEN- The patient is well appearing obese elderly female, alert and oriented x 3 today.   HEENT-head normocephalic, atraumatic, sclera clear, conjunctiva pink, hearing intact, trachea midline. Lungs- Clear to ausculation bilaterally, normal work of breathing Heart- Regular rate and rhythm, no murmurs, rubs or gallops  GI- soft, NT, ND, + BS Extremities- no clubbing, cyanosis. Non pitting edema MS- no significant deformity or atrophy Skin- no rash or lesion Psych- euthymic mood, full affect Neuro- strength and sensation are intact   EKG- A paced rhythm HR 71, PR 234, QRS 96, QTc 447   Assessment and Plan: 1. Persistent Afib Patient back in [place]. Continue diltiazem 120 mg daily  If her afib returns and is symptomatic, will consider dofetilide admission.  Continue metoprolol 100 mg BID. Continue warfarin  2. CHA2DS2VASc score of at least 8 Continue wafarin  3. HTN Stable, no changes today.  4. SSS S/p PPM, followed by Dr Rayann Heman and the device clinic.   Follow up with Oda Kilts, Dr Radford Pax, and Dr Irish Lack per recall. AF clinic in 6 months.    [facility] [address] [place], [place] [address] [phone]

## 2019-12-29 ENCOUNTER — Other Ambulatory Visit (HOSPITAL_COMMUNITY): Payer: Self-pay | Admitting: Physician Assistant

## 2020-02-09 ENCOUNTER — Ambulatory Visit (INDEPENDENT_AMBULATORY_CARE_PROVIDER_SITE_OTHER): Payer: Medicare Other | Admitting: Internal Medicine

## 2020-02-09 ENCOUNTER — Other Ambulatory Visit: Payer: Self-pay

## 2020-02-09 ENCOUNTER — Encounter: Payer: Self-pay | Admitting: Internal Medicine

## 2020-02-09 VITALS — BP 112/62 | HR 81 | Ht 60.0 in | Wt 210.8 lb

## 2020-02-09 DIAGNOSIS — E782 Mixed hyperlipidemia: Secondary | ICD-10-CM | POA: Diagnosis not present

## 2020-02-09 DIAGNOSIS — I495 Sick sinus syndrome: Secondary | ICD-10-CM | POA: Diagnosis not present

## 2020-02-09 DIAGNOSIS — I4819 Other persistent atrial fibrillation: Secondary | ICD-10-CM

## 2020-02-09 NOTE — Patient Instructions (Addendum)
Medication Instructions:  Your physician recommends that you continue on your current medications as directed. Please refer to the Current Medication list given to you today.  *If you need a refill on your cardiac medications before your next appointment, please call your pharmacy*  Lab Work: None ordered.  If you have labs (blood work) drawn today and your tests are completely normal, you will receive your results only by: Marland Kitchen MyChart Message (if you have MyChart) OR . A paper copy in the mail If you have any lab test that is abnormal or we need to change your treatment, we will call you to review the results.  Testing/Procedures: please schedule Echo, needs sooner follow up with Dr. Irish Lack than the recall date.  Your physician has requested that you have an echocardiogram. Echocardiography is a painless test that uses sound waves to create images of your heart. It provides your doctor with information about the size and shape of your heart and how well your heart's chambers and valves are working. This procedure takes approximately one hour. There are no restrictions for this procedure.   Follow-Up: At [facility], you and your health needs are our priority.  As part of our continuing mission to provide you with exceptional heart care, we have created designated Provider Care Teams.  These Care Teams include your primary Cardiologist (physician) and Advanced Practice Providers (APPs -  Physician Assistants and Nurse Practitioners) who all work together to provide you with the care you need, when you need it.  We recommend signing up for the patient portal called "MyChart".  Sign up information is provided on this After Visit Summary.  MyChart is used to connect with patients for Virtual Visits (Telemedicine).  Patients are able to view lab/test results, encounter notes, upcoming appointments, etc.  Non-urgent messages can be sent to your provider as well.   To learn more about what you can  do with MyChart, go to [email or web address].    Your next appointment:   Your physician wants you to follow-up in: 1 year with Lynn Dunn.  You will receive a reminder letter in the mail two months in advance. If you don't receive a letter, please call our office to schedule the follow-up appointment.  Remote monitoring is used to monitor your Pacemaker from home. This monitoring reduces the number of office visits required to check your device to one time per year. It allows [place] to keep an eye on the functioning of your device to ensure it is working properly. You are scheduled for a device check from home on 02/18/21. You may send your transmission at any time that day. If you have a wireless device, the transmission will be sent automatically. After your physician reviews your transmission, you will receive a postcard with your next transmission date.  Other Instructions:

## 2020-02-09 NOTE — Progress Notes (Signed)
PCP: Javier Glazier, MD Primary Cardiologist: Dr Irish Lack Primary EP:  Dr Rayann Heman  Lynn Dunn is a 86 y.o. female who presents today for routine electrophysiology followup.  Since last being seen in our clinic, the patient reports doing poorly.  She has very chronic SOB and edema.   As with prior visits, she insists that it continues to progress, though clinically, she looks very stable to me.  Her afib burden is controlled.  Her histograms appear normal though she reports that her heart rates "shoot up" with minimal activity and she is having so much afib that she cannot do anything. Today, she denies symptoms of chest pain,  dizziness, presyncope, or syncope.  She has chronic massive edema from venous insufficiency.  The patient is otherwise without complaint today.   Past Medical History:  Diagnosis Date  . Airway malacia   . Anemia   . Anginal pain ([place]) 2011   had to take nitro   . Arthritis   . COPD (chronic obstructive pulmonary disease) ([place])   . Dysuria   . GERD (gastroesophageal reflux disease)   . Hematuria   . HTN (hypertension)   . Hx of echocardiogram    Echo (12/15):  Mild LVH, EF 60-65%, mild MR, mod LAE (LA 49 mm), PASP 43 mmHg  . Obesity   . OSA (obstructive sleep apnea)    cpap at home  . PAF (paroxysmal atrial fibrillation) ([place])    s/p ablation 07/29/09  . Sick sinus syndrome ([facility])    pauses with syncope  . Stroke ([place]) 6/09   NO RESIDUAL PROBLEMS   Past Surgical History:  Procedure Laterality Date  . ABDOMINAL HYSTERECTOMY    . ATRIAL ABLATION SURGERY     s/p ablation for afib 07/29/09  . CYSTOSCOPY N/A 06/23/2015   Procedure: CYSTOSCOPY;  Surgeon: Alexis Frock, MD;  [facility]: [facility];  Service: Urology;  Laterality: N/A;  . EP IMPLANTABLE DEVICE N/A 02/17/2015   MDT Advisa MRI PPM implanted by Dr Lovena Le for sick sinus and syncope  . EP IMPLANTABLE DEVICE N/A 02/17/2015   Procedure: Loop Recorder Removal;  Surgeon: Evans Lance, MD;  Location:  [facility];  Service: Cardiovascular;  Laterality: N/A;  . ESOPHAGOGASTRODUODENOSCOPY  01/24/2012   Procedure: ESOPHAGOGASTRODUODENOSCOPY (EGD);  Surgeon: Jeryl Columbia, MD;  Location: [facility] ENDOSCOPY;  Service: Endoscopy;  Laterality: N/A;  . KNEE ARTHROSCOPY     BILATERAL  . LOOP RECORDER IMPLANT N/A 03/12/2014   Procedure: LOOP RECORDER IMPLANT;  Surgeon: Thompson Grayer, MD;  Location[facility] CATH LAB;  Service: Cardiovascular;  Laterality: N/A;  . LYSIS OF ADHESION N/A 06/23/2015   Procedure: LYSIS OF LABIAL ADHESION;  Surgeon: Alexis Frock, MD;  Location: [facility];  Service: Urology;  Laterality: N/A;  . VIDEO BRONCHOSCOPY Bilateral 12/15/2016   Procedure: VIDEO BRONCHOSCOPY WITHOUT FLUORO;  Surgeon: Collene Gobble, MD;  [facility]OSCOPY;  Service: Cardiopulmonary;  Laterality: Bilateral;    ROS- all systems are reviewed and negative except as per HPI above  Current Outpatient Medications  Medication Sig Dispense Refill  . calcitRIOL (ROCALTROL) 0.25 MCG capsule Take 0.25 mcg by mouth daily.     . Coenzyme Q10 (COQ10 PO) Take 1 capsule by mouth at bedtime.     Marland Kitchen diltiazem (CARDIZEM CD) 120 MG 24 hr capsule TAKE ONE CAPSULE BY MOUTH DAILY 30 capsule 6  . estradiol (ESTRACE VAGINAL) 0.1 MG/GM vaginal cream Apply pea-sized amount to vagina / urethra 2-3 x weekly  to prevent labial adhesions 42.5 g 12  . furosemide (LASIX) 40 MG tablet Take 120 mg by mouth daily.     Marland Kitchen HYDROcodone-acetaminophen (NORCO/VICODIN) 5-325 MG tablet Take 1 tablet by mouth as needed for pain.    Marland Kitchen levothyroxine (SYNTHROID, LEVOTHROID) 50 MCG tablet Take 50 mcg by mouth daily before breakfast.     . Magnesium 100 MG CAPS Take 100 mg by mouth 2 (two) times daily.     . metoprolol tartrate (LOPRESSOR) 25 MG tablet Taking 4 tablets by mouth twice daily    . potassium chloride SA (K-DUR,KLOR-CON) 20 MEQ tablet Take 20 mEq by mouth daily.     . predniSONE 5 MG TBEC Take 5 mg by mouth daily. 30 tablet 5  .  rosuvastatin (CRESTOR) 10 MG tablet Take 10 mg by mouth at bedtime.     . Turmeric 500 MG CAPS Take 500 mg by mouth 2 (two) times daily.    Marland Kitchen warfarin (COUMADIN) 3 MG tablet Take 1.5-3 mg by mouth See admin instructions. Take 1.5 mg by mouth in the evening on Sun/Wed and 3 mg on Mon/Tues/Thurs/Fri/Sat     No current facility-administered medications for this visit.    Physical Exam: Vitals:   02/09/20 1458  BP: 112/62  Pulse: 81  SpO2: 96%  Weight: 210 lb 12.8 oz (95.6 kg)  Height: 5' (1.524 m)   GEN- The patient is elderly appearing, alert and oriented x 3 today.   Head- normocephalic, atraumatic Eyes-  Sclera clear, conjunctiva pink Ears- hearing intact Oropharynx- clear Lungs-  normal work of breathing Chest- pacemaker pocket is well healed Heart- Regular rate and rhythm, no murmurs, rubs or gallops, PMI not laterally displaced GI- soft, NT, ND, + BS Extremities- no clubbing, cyanosis, + edema  Pacemaker interrogation- reviewed in detail today,  See PACEART report  ekg tracing ordered today is personally reviewed and shows sinus  Assessment and Plan:  1. Symptomatic sinus bradycardia  Normal pacemaker function See Pace Art report No changes today she is not device dependant today  2. Persistent afib chads2vasc score is 8 She is on coumadin afib burden is 2%  Consider tikosyn if afib burden increases  3. HTN Stable No change required today  4. Obesity Body mass index is 41.17 kg/m. Lifestyle modification is advised  5. SOB/ edema Very chronic  I am not convinced that she is worse though she feels that she is. She takes lasix 120mg  daily Repeat echo Sodium restriction Follow-up with Dr Irish Lack  Risks, benefits and potential toxicities for medications prescribed and/or refilled reviewed with patient today.   Return to see EP PA In a year  Thompson Grayer MD, [facility] 02/09/2020 3:16 PM

## 2020-02-11 NOTE — Addendum Note (Signed)
Addended by: Rose Phi on: 02/11/2020 07:58 AM   Modules accepted: Orders

## 2020-02-14 ENCOUNTER — Encounter (HOSPITAL_BASED_OUTPATIENT_CLINIC_OR_DEPARTMENT_OTHER): Payer: Self-pay | Admitting: Emergency Medicine

## 2020-02-14 ENCOUNTER — Emergency Department (HOSPITAL_BASED_OUTPATIENT_CLINIC_OR_DEPARTMENT_OTHER)
Admission: EM | Admit: 2020-02-14 | Discharge: 2020-02-14 | Disposition: A | Discharge status: Home / Self Care | Payer: Medicare Other | Attending: Emergency Medicine | Admitting: Emergency Medicine

## 2020-02-14 ENCOUNTER — Emergency Department (HOSPITAL_BASED_OUTPATIENT_CLINIC_OR_DEPARTMENT_OTHER): Payer: Medicare Other

## 2020-02-14 DIAGNOSIS — I10 Essential (primary) hypertension: Secondary | ICD-10-CM | POA: Diagnosis not present

## 2020-02-14 DIAGNOSIS — R2243 Localized swelling, mass and lump, lower limb, bilateral: Secondary | ICD-10-CM | POA: Diagnosis present

## 2020-02-14 DIAGNOSIS — Z8673 Personal history of transient ischemic attack (TIA), and cerebral infarction without residual deficits: Secondary | ICD-10-CM | POA: Insufficient documentation

## 2020-02-14 DIAGNOSIS — L03119 Cellulitis of unspecified part of limb: Secondary | ICD-10-CM

## 2020-02-14 DIAGNOSIS — J449 Chronic obstructive pulmonary disease, unspecified: Secondary | ICD-10-CM | POA: Diagnosis not present

## 2020-02-14 DIAGNOSIS — L03115 Cellulitis of right lower limb: Secondary | ICD-10-CM | POA: Insufficient documentation

## 2020-02-14 DIAGNOSIS — L03116 Cellulitis of left lower limb: Secondary | ICD-10-CM | POA: Diagnosis not present

## 2020-02-14 DIAGNOSIS — Z7901 Long term (current) use of anticoagulants: Secondary | ICD-10-CM | POA: Insufficient documentation

## 2020-02-14 DIAGNOSIS — R600 Localized edema: Secondary | ICD-10-CM

## 2020-02-14 DIAGNOSIS — Z79899 Other long term (current) drug therapy: Secondary | ICD-10-CM | POA: Diagnosis not present

## 2020-02-14 LAB — CBC WITH DIFFERENTIAL/PLATELET
Abs Immature Granulocytes: 0.02 10*3/uL (ref 0.00–0.07)
Basophils Absolute: 0 10*3/uL (ref 0.0–0.1)
Basophils Relative: 1 %
Eosinophils Absolute: 0.1 10*3/uL (ref 0.0–0.5)
Eosinophils Relative: 2 %
HCT: 35.7 % — ABNORMAL LOW (ref 36.0–46.0)
Hemoglobin: 11.3 g/dL — ABNORMAL LOW (ref 12.0–15.0)
Immature Granulocytes: 0 %
Lymphocytes Relative: 19 %
Lymphs Abs: 1.2 10*3/uL (ref 0.7–4.0)
MCH: 32.2 pg (ref 26.0–34.0)
MCHC: 31.7 g/dL (ref 30.0–36.0)
MCV: 101.7 fL — ABNORMAL HIGH (ref 80.0–100.0)
Monocytes Absolute: 0.5 10*3/uL (ref 0.1–1.0)
Monocytes Relative: 7 %
Neutro Abs: 4.5 10*3/uL (ref 1.7–7.7)
Neutrophils Relative %: 71 %
Platelets: 278 10*3/uL (ref 150–400)
RBC: 3.51 MIL/uL — ABNORMAL LOW (ref 3.87–5.11)
RDW: 15.6 % — ABNORMAL HIGH (ref 11.5–15.5)
WBC: 6.4 10*3/uL (ref 4.0–10.5)
nRBC: 0 % (ref 0.0–0.2)

## 2020-02-14 LAB — BASIC METABOLIC PANEL
Anion gap: 9 (ref 5–15)
BUN: 26 mg/dL — ABNORMAL HIGH (ref 8–23)
CO2: 28 mmol/L (ref 22–32)
Calcium: 8.9 mg/dL (ref 8.9–10.3)
Chloride: 101 mmol/L (ref 98–111)
Creatinine, Ser: 1.38 mg/dL — ABNORMAL HIGH (ref 0.44–1.00)
GFR, Estimated: 37 mL/min — ABNORMAL LOW (ref >60)
Glucose, Bld: 142 mg/dL — ABNORMAL HIGH (ref 70–99)
Potassium: 3.3 mmol/L — ABNORMAL LOW (ref 3.5–5.1)
Sodium: 138 mmol/L (ref 135–145)

## 2020-02-14 LAB — BRAIN NATRIURETIC PEPTIDE: B Natriuretic Peptide: 314.1 pg/mL — ABNORMAL HIGH (ref 0.0–100.0)

## 2020-02-14 LAB — PROTIME-INR
INR: 1.7 — ABNORMAL HIGH (ref 0.8–1.2)
Prothrombin Time: 19.5 seconds — ABNORMAL HIGH (ref 11.4–15.2)

## 2020-02-14 MED ORDER — ACETAMINOPHEN 325 MG PO TABS
650.0000 mg | ORAL_TABLET | Freq: Once | ORAL | Status: AC
  Administered 2020-02-14: 14:00:00 650 mg via ORAL
  Filled 2020-02-14: qty 2

## 2020-02-14 MED ORDER — CEPHALEXIN 500 MG PO CAPS: 500.0000 mg | ORAL_CAPSULE | Freq: Three times a day (TID) | ORAL | 0 refills | Status: AC

## 2020-02-14 MED ORDER — FUROSEMIDE 10 MG/ML IJ SOLN
40.0000 mg | Freq: Once | INTRAMUSCULAR | Status: AC
  Administered 2020-02-14: 14:00:00 40 mg via INTRAVENOUS
  Filled 2020-02-14: qty 4

## 2020-02-14 MED ORDER — SODIUM CHLORIDE 0.9 % IV SOLN
INTRAVENOUS | Status: DC | PRN
  Administered 2020-02-14: 14:00:00 250 mL via INTRAVENOUS

## 2020-02-14 MED ORDER — SODIUM CHLORIDE 0.9 % IV SOLN
1.0000 g | Freq: Once | INTRAVENOUS | Status: AC
  Administered 2020-02-14: 14:00:00 1 g via INTRAVENOUS
  Filled 2020-02-14: qty 10

## 2020-02-14 NOTE — ED Triage Notes (Signed)
Bilateral leg and feet swelling, pain x 1 month.

## 2020-02-14 NOTE — ED Provider Notes (Signed)
[facility] EMERGENCY DEPARTMENT Provider Note   CSN: [id number] Arrival date & time: 02/14/20  1111     History Chief Complaint  Patient presents with  . Leg Swelling    Lynn Dunn is a 86 y.o. female.  HPI   Patient presents to the ED with complaints of bilateral leg swelling that started about a month ago.  Patient states she does have a history of peripheral edema.  She takes Lasix.  Patient states she has been compliant with that regimen and occasionally has been taking an extra dose for the swelling.  Patient states the leg swelling has not really gotten any better and she has also noticed swelling in her feet.  She then started to notice some redness to her lower legs and increasing pain and discomfort.  She is also very fluid draining.  Patient was concerned she might be developing cellulitis.  She has not seen her doctor for this since it started.  She denies any history of DVT.  Patient has not had any chest pain.  She has some chronic issues with her breathing but has not noticed any changes or worsening symptoms with this leg swelling  Past Medical History:  Diagnosis Date  . Airway malacia   . Anemia   . Anginal pain ([place]) 2011   had to take nitro   . Arthritis   . COPD (chronic obstructive pulmonary disease) ([place])   . Dysuria   . GERD (gastroesophageal reflux disease)   . Hematuria   . HTN (hypertension)   . Hx of echocardiogram    Echo (12/15):  Mild LVH, EF 60-65%, mild MR, mod LAE (LA 49 mm), PASP 43 mmHg  . Obesity   . OSA (obstructive sleep apnea)    cpap at home  . PAF (paroxysmal atrial fibrillation) ([place])    s/p ablation 07/29/09  . Sick sinus syndrome (HCC)    pauses with syncope  . Stroke ([place]) 6/09   NO RESIDUAL PROBLEMS    Patient Active Problem List   Diagnosis Date Noted  . Secondary hypercoagulable state ([place]) 11/13/2019  . Diastolic dysfunction 69/62/9528  . Acute respiratory failure with hypoxia ([place]) 06/13/2018  . Chest  pain 06/13/2018  . Abnormal EKG 06/13/2018  . Airway malacia   . Restrictive lung disease 01/18/2017  . 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/07/2016  . COPD (chronic obstructive pulmonary disease) ([place]) 04/05/2016  . Subtherapeutic international normalized ratio (INR) 04/05/2016  . Atrial fibrillation with rapid ventricular response ([place]) 04/04/2016  . Carotid artery disease ([place]) 11/25/2015  . Pre-syncope 02/16/2015  . Sick sinus syndrome ([place]) 02/15/2015  . Swelling of right lower extremity 08/31/2014  . Mixed hyperlipidemia 12/27/2012  . Obesity 12/27/2012  . GI bleed 01/23/2012  . Anemia 01/23/2012  . History of CVA (cerebrovascular accident) 01/23/2012  . SHORTNESS OF BREATH (SOB) 09/13/2009  . Essential hypertension, benign 06/17/2009  . Atrial fibrillation ([place]) 06/17/2009  . OSA (obstructive sleep apnea) 06/17/2009    Past Surgical History:  Procedure Laterality Date  . ABDOMINAL HYSTERECTOMY    . ATRIAL ABLATION SURGERY     s/p ablation for afib 07/29/09  . CYSTOSCOPY N/A 06/23/2015   Procedure: CYSTOSCOPY;  Surgeon: Alexis Frock, MD;  [facility]: [facility];  Service: Urology;  Laterality: N/A;  . EP IMPLANTABLE DEVICE N/A 02/17/2015   MDT Advisa MRI PPM implanted by Dr Lovena Le for sick sinus and syncope  . EP IMPLANTABLE DEVICE N/A 02/17/2015   Procedure: Loop Recorder Removal;  Surgeon: Evans Lance,  MD;  Location: [facility] CV LAB;  Service: Cardiovascular;  Laterality: N/A;  . ESOPHAGOGASTRODUODENOSCOPY  01/24/2012   Procedure: ESOPHAGOGASTRODUODENOSCOPY (EGD);  Surgeon: Jeryl Columbia, MD;  Location: [facility] ENDOSCOPY;  Service: Endoscopy;  Laterality: N/A;  . KNEE ARTHROSCOPY     BILATERAL  . LOOP RECORDER IMPLANT N/A 03/12/2014   Procedure: LOOP RECORDER IMPLANT;  Surgeon: Thompson Grayer, MD;  Location: [facility] CATH LAB;  Service: Cardiovascular;  Laterality: N/A;  . LYSIS OF ADHESION N/A 06/23/2015   Procedure: LYSIS OF LABIAL ADHESION;  Surgeon: Alexis Frock, MD;  [facility]: [facility];  Service:  Urology;  Laterality: N/A;  . VIDEO BRONCHOSCOPY Bilateral 12/15/2016   Procedure: VIDEO BRONCHOSCOPY WITHOUT FLUORO;  Surgeon: Collene Gobble, MD;  [facility] ENDOSCOPY;  Service: Cardiopulmonary;  Laterality: Bilateral;     OB History   No obstetric history on file.     Family History  Problem Relation Age of Onset  . Breast cancer Mother   . Brain cancer Father   . Breast cancer Other   . Brain cancer Other   . Stroke Maternal Aunt   . Hypertension Sister   . Heart attack Neg Hx     Social History   Tobacco Use  . Smoking status: Never Smoker  . Smokeless tobacco: Never Used  Vaping Use  . Vaping Use: Never used  Substance Use Topics  . Alcohol use: Yes    Alcohol/week: 2.0 standard drinks    Types: 2 Glasses of wine per week    Comment: occassionally  . Drug use: No    Home Medications Prior to Admission medications   Medication Sig Start Date End Date Taking? Authorizing Provider  calcitRIOL (ROCALTROL) 0.25 MCG capsule Take 0.25 mcg by mouth daily.  05/07/17   [provider]  cephALEXin (KEFLEX) 500 MG capsule Take 1 capsule (500 mg total) by mouth 3 (three) times daily for 7 days. 02/14/20 02/21/20  Dorie Rank, MD  Coenzyme Q10 (COQ10 PO) Take 1 capsule by mouth at bedtime.     [provider]  diltiazem (CARDIZEM CD) 120 MG 24 hr capsule TAKE ONE CAPSULE BY MOUTH DAILY 12/29/19   Fenton, Clint R, PA  estradiol (ESTRACE VAGINAL) 0.1 MG/GM vaginal cream Apply pea-sized amount to vagina / urethra 2-3 x weekly to prevent labial adhesions 06/23/15   Alexis Frock, MD  furosemide (LASIX) 40 MG tablet Take 120 mg by mouth daily.     [provider]  HYDROcodone-acetaminophen (NORCO/VICODIN) 5-325 MG tablet Take 1 tablet by mouth as needed for pain. 01/09/20   [provider]  levothyroxine (SYNTHROID, LEVOTHROID) 50 MCG tablet Take 50 mcg by mouth daily before breakfast.  05/13/18   [provider]  Magnesium 100 MG CAPS  Take 100 mg by mouth 2 (two) times daily.     [provider]  metoprolol tartrate (LOPRESSOR) 25 MG tablet Taking 4 tablets by mouth twice daily 12/01/19   [provider]  potassium chloride SA (K-DUR,KLOR-CON) 20 MEQ tablet Take 20 mEq by mouth daily.     [provider]  predniSONE 5 MG TBEC Take 5 mg by mouth daily. 06/23/19   Collene Gobble, MD  rosuvastatin (CRESTOR) 10 MG tablet Take 10 mg by mouth at bedtime.  09/14/16   [provider]  Turmeric 500 MG CAPS Take 500 mg by mouth 2 (two) times daily.    [provider]  warfarin (COUMADIN) 3 MG tablet Take 1.5-3 mg by mouth See admin  instructions. Take 1.5 mg by mouth in the evening on Sun/Wed and 3 mg on Mon/Tues/Thurs/Fri/Sat    [provider]    Allergies    Ace inhibitors, Albuterol, Albuterol sulfate, Oysters [shellfish allergy], and Sulfa antibiotics  Review of Systems   Review of Systems  All other systems reviewed and are negative.   Physical Exam Updated Vital Signs BP (!) 154/65   Pulse 70   Temp 97.9 F (36.6 C) (Oral)   Resp 20   Ht 1.524 m (5')   Wt 95.3 kg   LMP  (LMP Unknown)   SpO2 99%   BMI 41.01 kg/m   Physical Exam Vitals and nursing note reviewed.  Constitutional:      General: She is not in acute distress.    Appearance: She is well-developed and well-nourished.  HENT:     Head: Normocephalic and atraumatic.     Right Ear: External ear normal.     Left Ear: External ear normal.  Eyes:     General: No scleral icterus.       Right eye: No discharge.        Left eye: No discharge.     Conjunctiva/sclera: Conjunctivae normal.  Neck:     Trachea: No tracheal deviation.  Cardiovascular:     Rate and Rhythm: Normal rate and regular rhythm.     Pulses: Intact distal pulses.  Pulmonary:     Effort: Pulmonary effort is normal. No respiratory distress.     Breath sounds: Normal breath sounds. No stridor. No wheezing or rales.  Abdominal:      General: Bowel sounds are normal. There is no distension.     Palpations: Abdomen is soft.     Tenderness: There is no abdominal tenderness. There is no guarding or rebound.  Musculoskeletal:        General: Tenderness present.     Cervical back: Neck supple.     Right lower leg: Edema present.     Left lower leg: Edema present.     Comments: Mild erythema bilateral lower extremities, significant edema bilateral lower legs and feet  Skin:    General: Skin is warm and dry.     Findings: No rash.  Neurological:     Mental Status: She is alert.     Cranial Nerves: No cranial nerve deficit (no facial droop, extraocular movements intact, no slurred speech).     Sensory: No sensory deficit.     Motor: No abnormal muscle tone or seizure activity.     Coordination: Coordination normal.     Deep Tendon Reflexes: Strength normal.  Psychiatric:        Mood and Affect: Mood and affect normal.     ED Results / Procedures / Treatments   Labs (all labs ordered are listed, but only abnormal results are displayed) Labs Reviewed  CBC WITH DIFFERENTIAL/PLATELET - Abnormal; Notable for the following components:      Result Value   RBC 3.51 (*)    Hemoglobin 11.3 (*)    HCT 35.7 (*)    MCV 101.7 (*)    RDW 15.6 (*)    All other components within normal limits  BASIC METABOLIC PANEL - Abnormal; Notable for the following components:   Potassium 3.3 (*)    Glucose, Bld 142 (*)    BUN 26 (*)    Creatinine, Ser 1.38 (*)    GFR, Estimated 37 (*)    All other components within normal limits  BRAIN NATRIURETIC  PEPTIDE - Abnormal; Notable for the following components:   B Natriuretic Peptide 314.1 (*)    All other components within normal limits  PROTIME-INR - Abnormal; Notable for the following components:   Prothrombin Time 19.5 (*)    INR 1.7 (*)    All other components within normal limits    EKG None  Radiology US Venous Img Lower Bilateral  Result Date: 02/14/2020 CLINICAL DATA:   Bilateral foot swelling. EXAM: Bilateral LOWER EXTREMITY VENOUS DOPPLER ULTRASOUND TECHNIQUE: Gray-scale sonography with compression, as well as color and duplex ultrasound, were performed to evaluate the deep venous system(s) from the level of the common femoral vein through the popliteal and proximal calf veins. COMPARISON:  None. FINDINGS: VENOUS Normal compressibility of the common femoral, superficial femoral, and popliteal veins, as well as the visualized calf veins. Visualized portions of profunda femoral vein and great saphenous vein unremarkable. No filling defects to suggest DVT on grayscale or color Doppler imaging. Doppler waveforms show normal direction of venous flow, normal respiratory plasticity and response to augmentation. Limited views of the contralateral common femoral vein are unremarkable. OTHER None. Limitations: none IMPRESSION: No evidence of deep venous thrombosis seen in either lower extremity. Electronically Signed   By: Marijo Conception M.D.   On: 02/14/2020 13:21    Procedures Procedures (including critical care time)  Medications Ordered in ED Medications  cefTRIAXone (ROCEPHIN) 1 g in sodium chloride 0.9 % 100 mL IVPB (1 g Intravenous New Bag/Given 02/14/20 1412)  0.9 %  sodium chloride infusion (250 mLs Intravenous New Bag/Given 02/14/20 1407)  acetaminophen (TYLENOL) tablet 650 mg (650 mg Oral Given 02/14/20 1347)  furosemide (LASIX) injection 40 mg (40 mg Intravenous Given 02/14/20 1408)    ED Course  I have reviewed the triage vital signs and the nursing notes.  Pertinent labs & imaging results that were available during my care of the patient were reviewed by me and considered in my medical decision making (see chart for details).  Clinical Course as of 02/14/20 1424  Sat Feb 14, 2020  1314 Labs reviewed.  CBC without leukocytosis.  Electrolyte panel shows stable renal insufficiency. [JK]  1315 BNP slightly increased compared to prior [JK]  1354 Discussed  findings with patient.  DVT study is negative.  Suspect she may have a component of chronic peripheral edema and possibly cellulitis. [JK]    Clinical Course User Index [JK] Dorie Rank, MD   MDM Rules/Calculators/A&P                          Patient presented to the ED for evaluation of leg swelling and redness.  Patient was not having any trouble with chest pain or shortness of breath.  I doubt acute CHF or PE.  Patient did have notable swelling on exam.  I suspect she has a significant component of lymphedema.  Patient is already taking Lasix 3 times daily.  She has not been wearing compression stockings however because she finds them uncomfortable.  Patient was concerned about cellulitis.  She does not have fever or white count but she does have erythema and tenderness on exam.  No evidence of purulent drainage on exam.  Doppler study was performed and she does not have evidence of DVT.  Will discharge patient home on a course of antibiotics.  Also have her continue her diuretics.  Consider compression stockings and leg elevation.  Outpatient follow-up with her primary care doctor. Final Clinical Impression(s) /  ED Diagnoses Final diagnoses:  Cellulitis of lower extremity, unspecified laterality  Bilateral lower extremity edema    Rx / DC Orders ED Discharge Orders         Ordered    cephALEXin (KEFLEX) 500 MG capsule  3 times daily        02/14/20 1424           Dorie Rank, MD 02/14/20 1433

## 2020-02-14 NOTE — Discharge Instructions (Addendum)
Take the antibiotics as prescribed.  Continue taking your diuretic medications.  Try to keep your legs elevated and consider wearing compression stockings when you are up and walking around.  Follow-up with your primary care doctor or cardiology doctor next week to make sure you are improving.

## 2020-02-19 ENCOUNTER — Ambulatory Visit (INDEPENDENT_AMBULATORY_CARE_PROVIDER_SITE_OTHER): Payer: Medicare Other

## 2020-02-19 DIAGNOSIS — I4891 Unspecified atrial fibrillation: Secondary | ICD-10-CM

## 2020-02-20 LAB — CUP PACEART REMOTE DEVICE CHECK
Battery Remaining Longevity: 64 mo
Battery Voltage: 3 V
Brady Statistic AP VP Percent: 0.58 %
Brady Statistic AP VS Percent: 42.16 %
Brady Statistic AS VP Percent: 10.73 %
Brady Statistic AS VS Percent: 46.54 %
Brady Statistic RA Percent Paced: 37.82 %
Brady Statistic RV Percent Paced: 11.63 %
Date Time Interrogation Session: 20211217103237
Implantable Lead Implant Date: 20161214
Implantable Lead Implant Date: 20161214
Implantable Lead Location: 753859
Implantable Lead Location: 753860
Implantable Lead Model: 5076
Implantable Lead Model: 5076
Implantable Pulse Generator Implant Date: 20161214
Lead Channel Impedance Value: 399 Ohm
Lead Channel Impedance Value: 399 Ohm
Lead Channel Impedance Value: 418 Ohm
Lead Channel Impedance Value: 494 Ohm
Lead Channel Pacing Threshold Amplitude: 1.25 V
Lead Channel Pacing Threshold Amplitude: 1.25 V
Lead Channel Pacing Threshold Pulse Width: 0.4 ms
Lead Channel Pacing Threshold Pulse Width: 0.4 ms
Lead Channel Sensing Intrinsic Amplitude: 2.875 mV
Lead Channel Sensing Intrinsic Amplitude: 2.875 mV
Lead Channel Sensing Intrinsic Amplitude: 9.25 mV
Lead Channel Sensing Intrinsic Amplitude: 9.25 mV
Lead Channel Setting Pacing Amplitude: 2.5 V
Lead Channel Setting Pacing Amplitude: 2.5 V
Lead Channel Setting Pacing Pulse Width: 0.4 ms
Lead Channel Setting Sensing Sensitivity: 2.8 mV

## 2020-03-04 ENCOUNTER — Encounter (HOSPITAL_COMMUNITY): Payer: Self-pay

## 2020-03-04 ENCOUNTER — Other Ambulatory Visit (HOSPITAL_COMMUNITY): Payer: Medicare Other

## 2020-03-04 NOTE — Progress Notes (Signed)
Verified appointment "no show" status with Johnnye Sima at 10:29.

## 2020-03-04 NOTE — Progress Notes (Signed)
Remote pacemaker transmission.   

## 2020-03-10 NOTE — Progress Notes (Signed)
Cardiology Office Note   Date:  03/11/2020   ID:  Lynn Dunn, Lynn Dunn 10-Mar-1930, MRN [id number]  PCP:  Javier Glazier, MD    No chief complaint on file.  AFib/DOE  Wt Readings from Last 3 Encounters:  03/11/20 218 lb 9.6 oz (99.2 kg)  02/14/20 210 lb (95.3 kg)  02/09/20 210 lb 12.8 oz (95.6 kg)       History of Present Illness: Lynn Dunn is a 87 y.o. female   Who has had AFib and CVA in the past, treated with TPA. She has had atrial fibrillation ablation in the past, and had distinct palpitations with AFib.   She hada loop monitor in place due to syncope/near syncope. She had a pacer placed.  She had knee surgery in 06/20/2014.   Unfortunately, her husband passed away in 06-20-2015 from aortic stenosis.   She has had chronic shortness of breath and was seen in 6/18 by Dr. Rayann Heman. His plan was : "Unclear etiology(of [facility]) I have discussed at length by phone with Dr Irish Lack today Will obtain cardiac CT to evaluate for pulmonary vein stenosis. If CT is unrevealing, would refer to pulmonary for further assessment. "  Amio was decreased for [facility].   Negative w/u for CAD by prior cath.   Per Dr. Lamonte Sakai in 02/2019: "Tracheomalacia This would appear to be the most significant pulmonary issue impacting her exertional dyspnea.Also probably components of restrictive lung disease from her obesity, deconditioning, underlying diastolic CHF. She has not responded to bronchodilators. I think we can stop the Anoro as I do not believe it has been effective. Unfortunately stenting is not a great option and there is no real way to reverse this process. I think that cardiopulmonary conditioning will give her the most benefit. I offered pulmonary rehab and she is going to think about it.  OSA (obstructive sleep apnea) Continue CPAP as she has been using it."  Patient instructions as follows from pulmonary: "Your CT scan of the chest shows no evidence of lung scarring  but confirms a significant collapse of your large airways on expiration Recommend that you wear your oxygen with all exertion Agree with increasing your exercise and physical conditioning as much as possible. We can refer you to pulmonary rehab for observed exercise to help you build up her stamina and maximize her cardiopulmonary conditioning, functional capacity. Please let us know if you would like [place] to make that referral."  She is not going to have the stent procedure.   She felt the [facility] was better with prednisone, and worse off of it.  Amiodarone was completely stopped.  No AFib since then.   Bronchitis in Jan 2021.    She got her COVID vaccines in 06/20/19.  SHe got them at [facility].   She is not going to do pulmonary rehab and transportation makes it difficult.  She does water aerobics at her residence.    Sees a vascular MD in [facility].  Uses oxygen most of the time.    Denies : Chest pain. Dizziness. Nitroglycerin use. Palpitations. Paroxysmal nocturnal dyspnea.  Syncope.    Past Medical History:  Diagnosis Date  . Airway malacia   . Anemia   . Anginal pain ([place]) 06/19/2009   had to take nitro   . Arthritis   . COPD (chronic obstructive pulmonary disease) ([place])   . Dysuria   . GERD (gastroesophageal reflux disease)   . Hematuria   . HTN (hypertension)   .  Hx of echocardiogram    Echo (12/15):  Mild LVH, EF 60-65%, mild MR, mod LAE (LA 49 mm), PASP 43 mmHg  . Obesity   . OSA (obstructive sleep apnea)    cpap at home  . PAF (paroxysmal atrial fibrillation) ([place])    s/p ablation 07/29/09  . Sick sinus syndrome (HCC)    pauses with syncope  . Stroke ([place]) 6/09   NO RESIDUAL PROBLEMS    Past Surgical History:  Procedure Laterality Date  . ABDOMINAL HYSTERECTOMY    . ATRIAL ABLATION SURGERY     s/p ablation for afib 07/29/09  . CYSTOSCOPY N/A 06/23/2015   Procedure: CYSTOSCOPY;  Surgeon: Alexis Frock, MD;  [facility];  Service: Urology;  Laterality:  N/A;  . EP IMPLANTABLE DEVICE N/A 02/17/2015   MDT Advisa MRI PPM implanted by Dr Lovena Le for sick sinus and syncope  . EP IMPLANTABLE DEVICE N/A 02/17/2015   Procedure: Loop Recorder Removal;  Surgeon: Evans Lance, MD;  Location: [facility] CV LAB;  Service: Cardiovascular;  Laterality: N/A;  . ESOPHAGOGASTRODUODENOSCOPY  01/24/2012   Procedure: ESOPHAGOGASTRODUODENOSCOPY (EGD);  Surgeon: Jeryl Columbia, MD;  Location[facility] ENDOSCOPY;  Service: Endoscopy;  Laterality: N/A;  . KNEE ARTHROSCOPY     BILATERAL  . LOOP RECORDER IMPLANT N/A 03/12/2014   Procedure: LOOP RECORDER IMPLANT;  Surgeon: Thompson Grayer, MD;  Location: [facility] CATH LAB;  Service: Cardiovascular;  Laterality: N/A;  . LYSIS OF ADHESION N/A 06/23/2015   Procedure: LYSIS OF LABIAL ADHESION;  Surgeon: Alexis Frock, MD;  [facility];  Service: Urology;  Laterality: N/A;  . VIDEO BRONCHOSCOPY Bilateral 12/15/2016   Procedure: VIDEO BRONCHOSCOPY WITHOUT FLUORO;  Surgeon: Collene Gobble, MD;  [facility] ENDOSCOPY;  Service: Cardiopulmonary;  Laterality: Bilateral;     Current Outpatient Medications  Medication Sig Dispense Refill  . calcitRIOL (ROCALTROL) 0.25 MCG capsule Take 0.25 mcg by mouth daily.     . Coenzyme Q10 (COQ10 PO) Take 1 capsule by mouth at bedtime.     Marland Kitchen diltiazem (CARDIZEM CD) 120 MG 24 hr capsule TAKE ONE CAPSULE BY MOUTH DAILY 30 capsule 6  . estradiol (ESTRACE VAGINAL) 0.1 MG/GM vaginal cream Apply pea-sized amount to vagina / urethra 2-3 x weekly to prevent labial adhesions 42.5 g 12  . furosemide (LASIX) 40 MG tablet Take 120 mg by mouth daily.     Marland Kitchen HYDROcodone-acetaminophen (NORCO/VICODIN) 5-325 MG tablet Take 1 tablet by mouth as needed for pain.    Marland Kitchen levothyroxine (SYNTHROID, LEVOTHROID) 50 MCG tablet Take 50 mcg by mouth daily before breakfast.     . Magnesium 100 MG CAPS Take 100 mg by mouth 2 (two) times daily.     . metoprolol tartrate (LOPRESSOR) 25 MG tablet Taking 4 tablets by mouth twice daily     . potassium chloride SA (K-DUR,KLOR-CON) 20 MEQ tablet Take 20 mEq by mouth daily.     . predniSONE 5 MG TBEC Take 5 mg by mouth daily. 30 tablet 5  . rosuvastatin (CRESTOR) 10 MG tablet Take 10 mg by mouth at bedtime.     . Turmeric 500 MG CAPS Take 500 mg by mouth 2 (two) times daily.    Marland Kitchen warfarin (COUMADIN) 3 MG tablet Take 1.5-3 mg by mouth See admin instructions. Take 1.5 mg by mouth in the evening on Sun/Wed and 3 mg on Mon/Tues/Thurs/Fri/Sat     No current facility-administered medications for this visit.    Allergies:   Ace inhibitors, Albuterol, Albuterol  sulfate, Oysters [shellfish allergy], and Sulfa antibiotics    Social History:  The patient  reports that she has never smoked. She has never used smokeless tobacco. She reports current alcohol use of about 2.0 standard drinks of alcohol per week. She reports that she does not use drugs.   Family History:  The patient's family history includes Brain cancer in her father and another family member; Breast cancer in her mother and another family member; Hypertension in her sister; Stroke in her maternal aunt.    ROS:  Please see the history of present illness.   Otherwise, review of systems are positive for leg swelling.   All other systems are reviewed and negative.    PHYSICAL EXAM: VS:  BP 126/60   Pulse 88   Ht 5' (1.524 m)   Wt 218 lb 9.6 oz (99.2 kg)   LMP  (LMP Unknown)   SpO2 93%   BMI 42.69 kg/m  , BMI Body mass index is 42.69 kg/m. GEN: Well nourished, well developed, in no acute distress  HEENT: normal  Neck: no JVD, carotid bruits, or masses Cardiac: RRR; no murmurs, rubs, or gallops,; bilateral LE nonpitting edema  Respiratory:  clear to auscultation bilaterally, normal work of breathing GI: soft, nontender, nondistended, + BS, obese MS: no deformity or atrophy ; left hand swollen Skin: warm and dry, no rash Neuro:  Strength and sensation are intact Psych: euthymic mood, full affect   EKG:   The ekg  ordered 02/09/20 demonstrates NSR   Recent Labs: 02/14/2020: B Natriuretic Peptide 314.1; BUN 26; Creatinine, Ser 1.38; Hemoglobin 11.3; Platelets 278; Potassium 3.3; Sodium 138   Lipid Panel    Component Value Date/Time   CHOL 170 08/31/2014 0941   TRIG 162.0 (H) 08/31/2014 0941   HDL 51.80 08/31/2014 0941   CHOLHDL 3 08/31/2014 0941   VLDL 32.4 08/31/2014 0941   LDLCALC 86 08/31/2014 0941     Other studies Reviewed: Additional studies/ records that were reviewed today with results demonstrating: EP notes.   ASSESSMENT AND PLAN:  1. PAF: In NSR.  Continue Diltiazem.  Followed by EP.    2. Anticoagulation: Warfarin for stroke prevention.  No bleeding issues.  3. Hypothyroid: Followed by PMD.  4. LE swelling: Probable lymphedema.  Seeing vascular surgery.  Not responding to Lasix or leg elevation. THis is her biggest dissatisfier.  As I explained, all of her vital organs are working well and the symptoms she is having are from non-lifethreatening issues at this time.    Current medicines are reviewed at length with the patient today.  The patient concerns regarding her medicines were addressed.  The following changes have been made:  No change  Labs/ tests ordered today include:  No orders of the defined types were placed in this encounter.   Recommend 150 minutes/week of aerobic exercise Low fat, low carb, high fiber diet recommended  Disposition:   FU in 1 year   Signed, Larae Grooms, MD  03/11/2020 9:34 AM    [facility] [address], [place],   [address] Phone: [phone]; Fax: [phone]

## 2020-03-11 ENCOUNTER — Ambulatory Visit (INDEPENDENT_AMBULATORY_CARE_PROVIDER_SITE_OTHER): Payer: Medicare Other | Admitting: Interventional Cardiology

## 2020-03-11 ENCOUNTER — Other Ambulatory Visit: Payer: Self-pay

## 2020-03-11 ENCOUNTER — Encounter (HOSPITAL_COMMUNITY): Payer: Self-pay | Admitting: Internal Medicine

## 2020-03-11 ENCOUNTER — Encounter: Payer: Self-pay | Admitting: Interventional Cardiology

## 2020-03-11 VITALS — BP 126/60 | HR 88 | Ht 60.0 in | Wt 218.6 lb

## 2020-03-11 DIAGNOSIS — I4819 Other persistent atrial fibrillation: Secondary | ICD-10-CM

## 2020-03-11 DIAGNOSIS — I495 Sick sinus syndrome: Secondary | ICD-10-CM | POA: Diagnosis not present

## 2020-03-11 DIAGNOSIS — R0609 Other forms of dyspnea: Secondary | ICD-10-CM

## 2020-03-11 DIAGNOSIS — Z7901 Long term (current) use of anticoagulants: Secondary | ICD-10-CM

## 2020-03-11 DIAGNOSIS — E782 Mixed hyperlipidemia: Secondary | ICD-10-CM

## 2020-03-11 DIAGNOSIS — R0600 Dyspnea, unspecified: Secondary | ICD-10-CM

## 2020-03-11 NOTE — Patient Instructions (Signed)

## 2020-03-18 ENCOUNTER — Telehealth: Payer: Self-pay | Admitting: Interventional Cardiology

## 2020-03-18 NOTE — Telephone Encounter (Signed)
Left message for [facility] to call back.

## 2020-03-18 NOTE — Telephone Encounter (Signed)
Holly NP with [facility] calling back

## 2020-03-18 NOTE — Telephone Encounter (Signed)
Holly returning [facility] call, states she was will leave her cell phone number for Pam and to call at her convenience. Cell is [phone]

## 2020-03-18 NOTE — Telephone Encounter (Signed)
   Pt c/o medication issue:  1. Name of Medication: furosemide (LASIX) 40 MG tablet  2. How are you currently taking this medication (dosage and times per day)? Take 120 mg by mouth daily.   3. Are you having a reaction (difficulty breathing--STAT)?  4. What is your medication issue? Holly NP with [facility] calling, she woud like to ask Dr. Irish Lack or his PA about pt's lasix, she said pt is not responding on her high dose of lasix, pt still have oedema and she would like to ask if they will let pt to start on spironolactone

## 2020-03-18 NOTE — Telephone Encounter (Signed)
OK to switch Lasix to Demadex.  I suspect some of her edema is lymphedema which may not respnd to diuretics.  She has seen a vascular surgeon for her edema as well.

## 2020-03-18 NOTE — Telephone Encounter (Signed)
See previous phone note.  

## 2020-03-18 NOTE — Telephone Encounter (Signed)
[facility] NP back. She wanted to run a few ideas by Dr. Irish Lack.  Patient has been having edema in BLE and hands and taking Lasix 40 mg TID. Earnest Bailey stated patient's edema is out of control and lasix is not working. Patient is having hard time having her shoes fit and her hands look like large marshmallows. Earnest Bailey was thinking of either changing patient to [facility] or spirolactone, but wanted to run it by Dr. Irish Lack. She stated Patient had lab work on 03/06/20 with [facility]  (BUN 19, Creatinine 2.28, and BNP  692). Will forward to Dr. Beau Fanny.

## 2020-03-19 MED ORDER — TORSEMIDE 20 MG PO TABS: 20.0000 mg | ORAL_TABLET | Freq: Two times a day (BID) | ORAL | 3 refills | Status: DC

## 2020-03-19 NOTE — Telephone Encounter (Signed)
I would start with Torsemide 20 mg BID.  BMet in 1 week.   JV

## 2020-03-19 NOTE — Telephone Encounter (Signed)
Left a detailed message for [facility] NP, to call the office back and ask to speak with a triage nurse, to endorse Dr. Hassell Done medication recommendation and follow-up lab on this mutual pt.  Left Earnest Bailey a message to call back and request to speak with any triage nurse.

## 2020-03-19 NOTE — Telephone Encounter (Signed)
Lynn Dunn with [facility] advised and she will place the order for the BMET in one week.

## 2020-03-19 NOTE — Telephone Encounter (Signed)
Will forward back to Dr. Irish Lack to determine which strength of Demadex.

## 2020-03-25 ENCOUNTER — Telehealth (HOSPITAL_COMMUNITY): Payer: Self-pay | Admitting: Internal Medicine

## 2020-03-25 NOTE — Telephone Encounter (Signed)
Just an FYI. We have made several attempts to contact this patient including sending a letter to schedule or reschedule their echocardiogram. We will be removing the patient from the echo [place].    03/11/20 MAILED LETTER [facility]  03/04/20 No show evd     Thank you

## 2020-03-25 NOTE — Telephone Encounter (Signed)
Will route this message to pts ordering Provider and covering RN, as an [place].

## 2020-04-26 ENCOUNTER — Other Ambulatory Visit: Payer: Self-pay

## 2020-04-26 MED ORDER — TORSEMIDE 20 MG PO TABS: 20.0000 mg | ORAL_TABLET | Freq: Two times a day (BID) | ORAL | 3 refills | Status: DC

## 2020-04-26 MED ORDER — DILTIAZEM HCL ER COATED BEADS 120 MG PO CP24: 120.0000 mg | ORAL_CAPSULE | Freq: Every day | ORAL | 3 refills | Status: DC

## 2020-04-27 ENCOUNTER — Other Ambulatory Visit: Payer: Self-pay

## 2020-04-27 MED ORDER — TORSEMIDE 20 MG PO TABS: 20.0000 mg | ORAL_TABLET | Freq: Two times a day (BID) | ORAL | 3 refills | Status: DC

## 2020-04-27 MED ORDER — DILTIAZEM HCL ER COATED BEADS 120 MG PO CP24: 120.0000 mg | ORAL_CAPSULE | Freq: Every day | ORAL | 3 refills | Status: DC

## 2020-04-29 ENCOUNTER — Ambulatory Visit (HOSPITAL_COMMUNITY): Payer: Medicare Other | Attending: Cardiology

## 2020-04-29 ENCOUNTER — Other Ambulatory Visit: Payer: Self-pay

## 2020-04-29 DIAGNOSIS — I4819 Other persistent atrial fibrillation: Secondary | ICD-10-CM | POA: Diagnosis not present

## 2020-04-29 DIAGNOSIS — E782 Mixed hyperlipidemia: Secondary | ICD-10-CM

## 2020-04-29 DIAGNOSIS — I495 Sick sinus syndrome: Secondary | ICD-10-CM | POA: Diagnosis not present

## 2020-04-29 LAB — ECHOCARDIOGRAM COMPLETE
AR max vel: 1.41 cm2
AV Area VTI: 1.67 cm2
AV Area mean vel: 1.44 cm2
AV Mean grad: 11 mmHg
AV Peak grad: 16.6 mmHg
Ao pk vel: 2.04 m/s
Area-P 1/2: 1.75 cm2
P 1/2 time: 444 msec
S' Lateral: 2.8 cm

## 2020-06-08 ENCOUNTER — Ambulatory Visit (INDEPENDENT_AMBULATORY_CARE_PROVIDER_SITE_OTHER): Payer: Medicare Other

## 2020-06-08 DIAGNOSIS — I495 Sick sinus syndrome: Secondary | ICD-10-CM | POA: Diagnosis not present

## 2020-06-09 LAB — CUP PACEART REMOTE DEVICE CHECK
Battery Remaining Longevity: 67 mo
Battery Voltage: 3 V
Brady Statistic AP VP Percent: 0.24 %
Brady Statistic AP VS Percent: 70.32 %
Brady Statistic AS VP Percent: 0.83 %
Brady Statistic AS VS Percent: 28.6 %
Brady Statistic RA Percent Paced: 69.92 %
Brady Statistic RV Percent Paced: 1.1 %
Date Time Interrogation Session: 20220405101114
Implantable Lead Implant Date: 20161214
Implantable Lead Implant Date: 20161214
Implantable Lead Location: 753859
Implantable Lead Location: 753860
Implantable Lead Model: 5076
Implantable Lead Model: 5076
Implantable Pulse Generator Implant Date: 20161214
Lead Channel Impedance Value: 399 Ohm
Lead Channel Impedance Value: 418 Ohm
Lead Channel Impedance Value: 437 Ohm
Lead Channel Impedance Value: 494 Ohm
Lead Channel Pacing Threshold Amplitude: 1.125 V
Lead Channel Pacing Threshold Amplitude: 1.375 V
Lead Channel Pacing Threshold Pulse Width: 0.4 ms
Lead Channel Pacing Threshold Pulse Width: 0.4 ms
Lead Channel Sensing Intrinsic Amplitude: 3.875 mV
Lead Channel Sensing Intrinsic Amplitude: 3.875 mV
Lead Channel Sensing Intrinsic Amplitude: 9.625 mV
Lead Channel Sensing Intrinsic Amplitude: 9.625 mV
Lead Channel Setting Pacing Amplitude: 2.25 V
Lead Channel Setting Pacing Amplitude: 2.75 V
Lead Channel Setting Pacing Pulse Width: 0.4 ms
Lead Channel Setting Sensing Sensitivity: 2.8 mV

## 2020-06-14 ENCOUNTER — Other Ambulatory Visit: Payer: Self-pay | Admitting: Physician Assistant

## 2020-06-18 NOTE — Progress Notes (Signed)
Remote pacemaker transmission.   

## 2020-07-01 ENCOUNTER — Telehealth: Payer: Self-pay | Admitting: Internal Medicine

## 2020-07-01 NOTE — Telephone Encounter (Signed)
Called patient to assess and see about sending manual transmission.   Patient reports yesterday around noon she had sudden episode of dizziness/lightheaded after standing up. Denies loc. Reports she experienced slight earlier this morning she felt dizzy but denies at this time. Reports of recent medication change (1 month ago) in fluid medication from Lasix to now taking torsemide 20 mg BID. Reports the lasix was not working but she is having good urine output and she thinks torsemide is working better. Patient does normally check her blood pressure but has not the past several days. States she is not able to check it while we are on the phone.   Remote transmission received. No events alerted. Presenting appears AP/VS 92 bpm. No Optivol trends available on device. Advised patient to follow up with PCP/Dr. Irish Lack about her symptoms as they can cause these symptoms with fluid shift due to recent medication change.   ED precautions advised. Patient verbalized understanding and is agreeable to plan.

## 2020-07-01 NOTE — Telephone Encounter (Signed)
BP: 120/73  HR 95 Patient will take BP tonight and again in the morning, to see if Dr. Irish Lack wants to make any changes to torsemide, based on those readings. Patient unable to weight self states her scale is broken. Advised will have Dr. Irish Lack nurse reach out to her tomorrow to follow up.

## 2020-07-01 NOTE — Telephone Encounter (Signed)
   STAT if patient feels like he/she is going to faint   1) Are you dizzy now? Yes  2) Do you feel faint or have you passed out? Yes, she does feel she is going to pass out  3) Do you have any other symptoms? none  4) Have you checked your HR and BP (record if available)? None   Pt said she felt swirling, she feels like there's something wrong, she said she will send a manual transmission and she wanted to speak with a nurse

## 2020-07-02 ENCOUNTER — Encounter (HOSPITAL_BASED_OUTPATIENT_CLINIC_OR_DEPARTMENT_OTHER): Payer: Self-pay | Admitting: Emergency Medicine

## 2020-07-02 ENCOUNTER — Other Ambulatory Visit: Payer: Self-pay

## 2020-07-02 ENCOUNTER — Emergency Department (HOSPITAL_BASED_OUTPATIENT_CLINIC_OR_DEPARTMENT_OTHER): Payer: Medicare Other

## 2020-07-02 ENCOUNTER — Emergency Department (HOSPITAL_BASED_OUTPATIENT_CLINIC_OR_DEPARTMENT_OTHER)
Admission: EM | Admit: 2020-07-02 | Discharge: 2020-07-02 | Disposition: A | Discharge status: Home / Self Care | Payer: Medicare Other | Attending: Emergency Medicine | Admitting: Emergency Medicine

## 2020-07-02 DIAGNOSIS — R531 Weakness: Secondary | ICD-10-CM | POA: Insufficient documentation

## 2020-07-02 DIAGNOSIS — Z7901 Long term (current) use of anticoagulants: Secondary | ICD-10-CM | POA: Diagnosis not present

## 2020-07-02 DIAGNOSIS — I480 Paroxysmal atrial fibrillation: Secondary | ICD-10-CM | POA: Diagnosis not present

## 2020-07-02 DIAGNOSIS — J449 Chronic obstructive pulmonary disease, unspecified: Secondary | ICD-10-CM | POA: Insufficient documentation

## 2020-07-02 DIAGNOSIS — Z79899 Other long term (current) drug therapy: Secondary | ICD-10-CM | POA: Insufficient documentation

## 2020-07-02 DIAGNOSIS — I10 Essential (primary) hypertension: Secondary | ICD-10-CM | POA: Insufficient documentation

## 2020-07-02 DIAGNOSIS — R42 Dizziness and giddiness: Secondary | ICD-10-CM | POA: Insufficient documentation

## 2020-07-02 DIAGNOSIS — R5383 Other fatigue: Secondary | ICD-10-CM | POA: Diagnosis not present

## 2020-07-02 DIAGNOSIS — I2510 Atherosclerotic heart disease of native coronary artery without angina pectoris: Secondary | ICD-10-CM | POA: Diagnosis not present

## 2020-07-02 LAB — CBC WITH DIFFERENTIAL/PLATELET
Abs Immature Granulocytes: 0.03 10*3/uL (ref 0.00–0.07)
Basophils Absolute: 0 10*3/uL (ref 0.0–0.1)
Basophils Relative: 0 %
Eosinophils Absolute: 0.2 10*3/uL (ref 0.0–0.5)
Eosinophils Relative: 2 %
HCT: 36.5 % (ref 36.0–46.0)
Hemoglobin: 11.7 g/dL — ABNORMAL LOW (ref 12.0–15.0)
Immature Granulocytes: 0 %
Lymphocytes Relative: 19 %
Lymphs Abs: 1.6 10*3/uL (ref 0.7–4.0)
MCH: 30.1 pg (ref 26.0–34.0)
MCHC: 32.1 g/dL (ref 30.0–36.0)
MCV: 93.8 fL (ref 80.0–100.0)
Monocytes Absolute: 0.5 10*3/uL (ref 0.1–1.0)
Monocytes Relative: 6 %
Neutro Abs: 6.2 10*3/uL (ref 1.7–7.7)
Neutrophils Relative %: 73 %
Platelets: 253 10*3/uL (ref 150–400)
RBC: 3.89 MIL/uL (ref 3.87–5.11)
RDW: 16.3 % — ABNORMAL HIGH (ref 11.5–15.5)
WBC: 8.6 10*3/uL (ref 4.0–10.5)
nRBC: 0 % (ref 0.0–0.2)

## 2020-07-02 LAB — COMPREHENSIVE METABOLIC PANEL
ALT: 13 U/L (ref 0–44)
AST: 18 U/L (ref 15–41)
Albumin: 3.6 g/dL (ref 3.5–5.0)
Alkaline Phosphatase: 98 U/L (ref 38–126)
Anion gap: 11 (ref 5–15)
BUN: 19 mg/dL (ref 8–23)
CO2: 29 mmol/L (ref 22–32)
Calcium: 8.7 mg/dL — ABNORMAL LOW (ref 8.9–10.3)
Chloride: 97 mmol/L — ABNORMAL LOW (ref 98–111)
Creatinine, Ser: 1.33 mg/dL — ABNORMAL HIGH (ref 0.44–1.00)
GFR, Estimated: 39 mL/min — ABNORMAL LOW (ref >60)
Glucose, Bld: 136 mg/dL — ABNORMAL HIGH (ref 70–99)
Potassium: 3.2 mmol/L — ABNORMAL LOW (ref 3.5–5.1)
Sodium: 137 mmol/L (ref 135–145)
Total Bilirubin: 0.6 mg/dL (ref 0.3–1.2)
Total Protein: 6.8 g/dL (ref 6.5–8.1)

## 2020-07-02 LAB — PROTIME-INR
INR: 2.2 — ABNORMAL HIGH (ref 0.8–1.2)
Prothrombin Time: 24 seconds — ABNORMAL HIGH (ref 11.4–15.2)

## 2020-07-02 MED ORDER — SODIUM CHLORIDE 0.9 % IV BOLUS
1000.0000 mL | Freq: Once | INTRAVENOUS | Status: AC
  Administered 2020-07-02: 1000 mL via INTRAVENOUS

## 2020-07-02 NOTE — Discharge Instructions (Addendum)
Overall suspect that your symptoms are secondary to medications.  Recommend that you hold your torsemide for the next 2 days and follow-up with your cardiologist early next week for possible further medication adjustments.  Continue to take your time when you change positions specifically from going from sitting to standing.

## 2020-07-02 NOTE — ED Provider Notes (Signed)
[facility] EMERGENCY DEPARTMENT Provider Note   CSN: [id number] Arrival date & time: 07/02/20  1302     History Chief Complaint  Patient presents with  . Dizziness    Lynn Dunn is a 87 y.o. female.  The history is provided by the patient.  Dizziness Quality:  Lightheadedness Severity:  Mild Onset quality:  Gradual Timing:  Intermittent Progression:  Waxing and waning Chronicity:  New Context: standing up   Relieved by:  Nothing Worsened by:  Nothing Associated symptoms: weakness (fatigue)   Associated symptoms: no blood in stool, no chest pain, no diarrhea, no headaches, no hearing loss, no nausea, no palpitations, no shortness of breath, no syncope and no vomiting        Past Medical History:  Diagnosis Date  . Airway malacia   . Anemia   . Anginal pain ([place]) 2011   had to take nitro   . Arthritis   . COPD (chronic obstructive pulmonary disease) ([place])   . Dysuria   . GERD (gastroesophageal reflux disease)   . Hematuria   . HTN (hypertension)   . Hx of echocardiogram    Echo (12/15):  Mild LVH, EF 60-65%, mild MR, mod LAE (LA 49 mm), PASP 43 mmHg  . Obesity   . OSA (obstructive sleep apnea)    cpap at home  . PAF (paroxysmal atrial fibrillation) ([place])    s/p ablation 07/29/09  . Sick sinus syndrome (HCC)    pauses with syncope  . Stroke ([place]) 6/09   NO RESIDUAL PROBLEMS    Patient Active Problem List   Diagnosis Date Noted  . Secondary hypercoagulable state ([place]) 11/13/2019  . Diastolic dysfunction 76/80/8811  . Acute respiratory failure with hypoxia ([place]) 06/13/2018  . Chest pain 06/13/2018  . Abnormal EKG 06/13/2018  . Airway malacia   . Restrictive lung disease 01/18/2017  . 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/07/2016  . COPD (chronic obstructive pulmonary disease) ([place]) 04/05/2016  . Subtherapeutic international normalized ratio (INR) 04/05/2016  . Atrial fibrillation with rapid ventricular response ([place]) 04/04/2016  . Carotid artery disease  ([place]) 11/25/2015  . Pre-syncope 02/16/2015  . Sick sinus syndrome ([place]) 02/15/2015  . Swelling of right lower extremity 08/31/2014  . Mixed hyperlipidemia 12/27/2012  . Obesity 12/27/2012  . GI bleed 01/23/2012  . Anemia 01/23/2012  . History of CVA (cerebrovascular accident) 01/23/2012  . SHORTNESS OF BREATH ([place]) 09/13/2009  . Essential hypertension, benign 06/17/2009  . Atrial fibrillation ([place]) 06/17/2009  . OSA (obstructive sleep apnea) 06/17/2009    Past Surgical History:  Procedure Laterality Date  . ABDOMINAL HYSTERECTOMY    . ATRIAL ABLATION SURGERY     s/p ablation for afib 07/29/09  . CYSTOSCOPY N/A 06/23/2015   Procedure: CYSTOSCOPY;  Surgeon: Alexis Frock, MD;  Location: [facility] ORS;  Service: Urology;  Laterality: N/A;  . EP IMPLANTABLE DEVICE N/A 02/17/2015   MDT Advisa MRI PPM implanted by Dr Lovena Le for sick sinus and syncope  . EP IMPLANTABLE DEVICE N/A 02/17/2015   Procedure: Loop Recorder Removal;  Surgeon: Evans Lance, MD;  Location: [facility] CV LAB;  Service: Cardiovascular;  Laterality: N/A;  . ESOPHAGOGASTRODUODENOSCOPY  01/24/2012   Procedure: ESOPHAGOGASTRODUODENOSCOPY (EGD);  Surgeon: Jeryl Columbia, MD;  Location: [facility] ENDOSCOPY;  Service: Endoscopy;  Laterality: N/A;  . KNEE ARTHROSCOPY     BILATERAL  . LOOP RECORDER IMPLANT N/A 03/12/2014   Procedure: LOOP RECORDER IMPLANT;  Surgeon: Thompson Grayer, MD;  Location: [facility] CATH LAB;  Service: Cardiovascular;  Laterality:  N/A;  . LYSIS OF ADHESION N/A 06/23/2015   Procedure: LYSIS OF LABIAL ADHESION;  Surgeon: Alexis Frock, MD;  Location: [facility] ORS;  Service: Urology;  Laterality: N/A;  . VIDEO BRONCHOSCOPY Bilateral 12/15/2016   Procedure: VIDEO BRONCHOSCOPY WITHOUT FLUORO;  Surgeon: Collene Gobble, MD;  Location: [facility] ENDOSCOPY;  Service: Cardiopulmonary;  Laterality: Bilateral;     OB History   No obstetric history on file.     Family History  Problem Relation Age of Onset  . Breast cancer Mother   . Brain  cancer Father   . Breast cancer Other   . Brain cancer Other   . Stroke Maternal Aunt   . Hypertension Sister   . Heart attack Neg Hx     Social History   Tobacco Use  . Smoking status: Never Smoker  . Smokeless tobacco: Never Used  Vaping Use  . Vaping Use: Never used  Substance Use Topics  . Alcohol use: Yes    Alcohol/week: 2.0 standard drinks    Types: 2 Glasses of wine per week    Comment: occassionally  . Drug use: No    Home Medications Prior to Admission medications   Medication Sig Start Date End Date Taking? Authorizing Provider  calcitRIOL (ROCALTROL) 0.25 MCG capsule Take 0.25 mcg by mouth daily.  05/07/17   [provider]  Coenzyme Q10 (COQ10 PO) Take 1 capsule by mouth at bedtime.     [provider]  diltiazem (CARDIZEM CD) 120 MG 24 hr capsule Take 1 capsule (120 mg total) by mouth daily. 04/27/20   Jettie Booze, MD  estradiol [facility] VAGINAL) 0.1 MG/GM vaginal cream Apply pea-sized amount to vagina / urethra 2-3 x weekly to prevent labial adhesions 06/23/15   Alexis Frock, MD  HYDROcodone-acetaminophen (NORCO/VICODIN) 5-325 MG tablet Take 1 tablet by mouth as needed for pain. 01/09/20   [provider]  levothyroxine (SYNTHROID, LEVOTHROID) 50 MCG tablet Take 50 mcg by mouth daily before breakfast.  05/13/18   [provider]  Magnesium 100 MG CAPS Take 100 mg by mouth 2 (two) times daily.     [provider]  metoprolol tartrate (LOPRESSOR) 25 MG tablet Taking 4 tablets by mouth twice daily 12/01/19   [provider]  potassium chloride SA (K-DUR,KLOR-CON) 20 MEQ tablet Take 20 mEq by mouth daily.     [provider]  predniSONE 5 MG TBEC Take 5 mg by mouth daily. 06/23/19   Collene Gobble, MD  rosuvastatin (CRESTOR) 10 MG tablet Take 10 mg by mouth at bedtime.  09/14/16   [provider]  torsemide (DEMADEX) 20 MG tablet Take 1 tablet (20 mg total) by mouth 2 (two) times daily. 04/27/20    Jettie Booze, MD  Turmeric 500 MG CAPS Take 500 mg by mouth 2 (two) times daily.    [provider]  warfarin (COUMADIN) 3 MG tablet Take 1.5-3 mg by mouth See admin instructions. Take 1.5 mg by mouth in the evening on Sun/Wed and 3 mg on Mon/Tues/Thurs/Fri/Sat    [provider]    Allergies    Ace inhibitors, Albuterol, Albuterol sulfate, Oysters [shellfish allergy], and Sulfa antibiotics  Review of Systems   Review of Systems  Constitutional: Negative for chills and fever.  HENT: Negative for ear pain, hearing loss and sore throat.   Eyes: Negative for pain and visual disturbance.  Respiratory: Negative for cough and shortness of breath.   Cardiovascular: Negative for chest pain, palpitations and  syncope.  Gastrointestinal: Negative for abdominal pain, blood in stool, diarrhea, nausea and vomiting.  Genitourinary: Negative for dysuria and hematuria.  Musculoskeletal: Negative for arthralgias and back pain.  Skin: Negative for color change and rash.  Neurological: Positive for dizziness and weakness (fatigue). Negative for seizures, syncope and headaches.  All other systems reviewed and are negative.   Physical Exam Updated Vital Signs  ED Triage Vitals  Enc Vitals Group     BP 07/02/20 1309 (!) 131/91     Pulse Rate 07/02/20 1309 81     Resp 07/02/20 1309 18     Temp 07/02/20 1309 98.2 F (36.8 C)     Temp Source 07/02/20 1309 Oral     SpO2 07/02/20 1309 96 %     Weight 07/02/20 1311 218 lb 11.1 oz (99.2 kg)     Height 07/02/20 1311 5' (1.524 m)     Head Circumference --      Peak Flow --      Pain Score 07/02/20 1310 0     Pain Loc --      Pain Edu? --      Excl. in [place]? --     Physical Exam Vitals and nursing note reviewed.  Constitutional:      General: She is not in acute distress.    Appearance: She is well-developed. She is not ill-appearing.  HENT:     Head: Normocephalic and atraumatic.     Nose: Nose normal.     Mouth/Throat:      Mouth: Mucous membranes are moist.  Eyes:     Extraocular Movements: Extraocular movements intact.     Conjunctiva/sclera: Conjunctivae normal.     Pupils: Pupils are equal, round, and reactive to light.  Cardiovascular:     Rate and Rhythm: Normal rate and regular rhythm.     Pulses: Normal pulses.     Heart sounds: Normal heart sounds. No murmur heard.   Pulmonary:     Effort: Pulmonary effort is normal. No respiratory distress.     Breath sounds: Normal breath sounds.  Abdominal:     Palpations: Abdomen is soft.     Tenderness: There is no abdominal tenderness.  Musculoskeletal:     Cervical back: Normal range of motion and neck supple.  Skin:    General: Skin is warm and dry.     Capillary Refill: Capillary refill takes less than 2 seconds.  Neurological:     General: No focal deficit present.     Mental Status: She is alert and oriented to person, place, and time.     Cranial Nerves: No cranial nerve deficit.     Sensory: No sensory deficit.     Motor: No weakness.     Coordination: Coordination normal.     Comments: Normal gait, 5+ out of 5 strength throughout, normal sensation, no drift     ED Results / Procedures / Treatments   Labs (all labs ordered are listed, but only abnormal results are displayed) Labs Reviewed  CBC WITH DIFFERENTIAL/PLATELET - Abnormal; Notable for the following components:      Result Value   Hemoglobin 11.7 (*)    RDW 16.3 (*)    All other components within normal limits  COMPREHENSIVE METABOLIC PANEL - Abnormal; Notable for the following components:   Potassium 3.2 (*)    Chloride 97 (*)    Glucose, Bld 136 (*)    Creatinine, Ser 1.33 (*)    Calcium 8.7 (*)  GFR, Estimated 39 (*)    All other components within normal limits  PROTIME-INR - Abnormal; Notable for the following components:   Prothrombin Time 24.0 (*)    INR 2.2 (*)    All other components within normal limits    EKG EKG Interpretation  Date/Time:  Friday  July 02 2020 13:09:17 EDT Ventricular Rate:  82 PR Interval:  187 QRS Duration: 105 QT Interval:  405 QTC Calculation: 473 R Axis:   -25 Text Interpretation: Sinus or ectopic atrial rhythm Borderline left axis deviation Anterior infarct, age indeterminate Confirmed by Lennice Sites [phone]) on 07/02/2020 1:35:08 PM   Radiology DG Chest Portable 1 View  Result Date: 07/02/2020 CLINICAL DATA:  Dizziness EXAM: PORTABLE CHEST 1 VIEW COMPARISON:  12/01/2019 FINDINGS: Stable cardiomegaly. Atherosclerotic calcification of the aortic knob. Stable positioning of a left-sided implanted cardiac device. No focal airspace consolidation, pleural effusion, or pneumothorax. IMPRESSION: No active disease. Electronically Signed   By: Davina Poke D.O.   On: 07/02/2020 14:01    Procedures Procedures   Medications Ordered in ED Medications  sodium chloride 0.9 % bolus 1,000 mL (1,000 mLs Intravenous New Bag/Given 07/02/20 1347)    ED Course  I have reviewed the triage vital signs and the nursing notes.  Pertinent labs & imaging results that were available during my care of the patient were reviewed by me and considered in my medical decision making (see chart for details).    MDM Rules/Calculators/A&P                          SHALENE GALLEN is an 87 year old female with history of atrial fibrillation on Coumadin who presents the ED with lightheadedness.  Has some orthostatics vitals at primary care doctor's office and was sent for evaluation.  She is asymptomatic now.  Orthostatics were normal with EMS.  She had a pacemaker evaluated yesterday with no issues.  No chest pain.  Normal neurological exam.  She is on torsemide.  Suspect likely some volume down.  Will give fluid bolus and check basic labs and get a head CT.  EKG shows sinus rhythm.  No ischemic changes.  Overall suspect symptoms are medication related.  Lab work overall unremarkable.  No significant anemia, electrolyte abnormality, kidney  injury.  Head CT normal.  Feeling better after IV fluids.  Orthostatics normal.  Recommend that she continue to be careful when she changes positions.  We will have her hold her torsemide for a few days and have her follow-up closely with her cardiologist to further discuss any medication changes.  Discharged in good condition.  Recheck of orthostatics was normal after IV fluids.  This chart was dictated using voice recognition software.  Despite best efforts to proofread,  errors can occur which can change the documentation meaning.    Final Clinical Impression(s) / ED Diagnoses Final diagnoses:  Lightheadedness    Rx / DC Orders ED Discharge Orders    None       Lennice Sites, DO 07/02/20 1447

## 2020-07-02 NOTE — Telephone Encounter (Signed)
Would have her try taking her diltiazem at night time. Separate her BP meds more and see if that helps.

## 2020-07-02 NOTE — Telephone Encounter (Signed)
Megan,  WOuld a cardizem CD formulation be better?  I am not aware of a 90 mg CD formulation capsule.  I think she may be getting an immediate release in the morning. Maybe we can switch to Cardizem CD 120 mg daily and uptitrate as needed.  Thanks.  JV

## 2020-07-02 NOTE — ED Triage Notes (Signed)
Reports intermittent dizziness since Wednesday.  Reports bp running low.

## 2020-07-02 NOTE — Telephone Encounter (Signed)
I spoke with patient.  She has not had any more dizzy spells since yesterday.  She reports the following readings- 4/28- 9:30 PM- 201/105. 4/29-  7:30 AM- 190/115, 97  (before taking AM medications)            8:30 AM- 92/56, 87  (took AM medications between 7:30 and 8:30)            9:20 AM-95/63, 81.  Patient has not been checking BP on a regular basis recently.  Only checks when she feels something is wrong.  Reports none of the above readings are normal for her.  States normal is around 136/65.  She is seeing her PCP today.  I asked her to take BP readings with her to appointment today. I told her message would be sent to Dr Irish Lack and we would call her back if any change needed.

## 2020-07-02 NOTE — ED Triage Notes (Signed)
Pt arrives from assisted living - [facility]- via [facility] with c/o dizziness since Wednesday. Staff reports BP of 88/56 O2 98%, Upon EMS arrival pt BP 90/60, 140/90 when in truck, did orthostatics with no change. Pt also c/o some nausea denies vomiting. NSR on monitor.

## 2020-07-02 NOTE — Telephone Encounter (Signed)
I spoke with patient and gave her information from pharmacist.  

## 2020-07-02 NOTE — ED Notes (Signed)
Reports she lives at [facility].  Called facility in regards to ride home.  Reports they will notify us if available or not.

## 2021-02-13 ENCOUNTER — Other Ambulatory Visit: Payer: Self-pay | Admitting: Interventional Cardiology

## 2021-03-28 ENCOUNTER — Other Ambulatory Visit: Payer: Self-pay | Admitting: Interventional Cardiology

## 2021-05-01 ENCOUNTER — Other Ambulatory Visit: Payer: Self-pay | Admitting: Interventional Cardiology

## 2021-05-19 ENCOUNTER — Ambulatory Visit: Payer: Medicare Other | Admitting: Interventional Cardiology

## 2021-05-23 ENCOUNTER — Other Ambulatory Visit: Payer: Self-pay | Admitting: Interventional Cardiology

## 2021-06-07 ENCOUNTER — Ambulatory Visit: Payer: Medicare Other

## 2021-06-22 NOTE — Progress Notes (Signed)
Remote pacemaker transmission.   

## 2021-08-12 NOTE — H&P (Signed)
 ------------------------------------------------------------------------------- Attestation signed by Isidor Somerset, MD at 08/12/2021  1:49 PM I saw and evaluated the patient and reviewed the resident's note. I agree with the resident's findings and plan.     -------------------------------------------------------------------------------  Cardiology Pre-Catheterization HPI  Lynn Dunn is a 88 y.o. female with history of COPD, bronchomalacia, chronic HFpEF, pAF, SSS s/p DC PPM, prior CVA presenting for right and left heart catheterization in the setting of persistent, severe shortness of breath.  Some concern for progressive coronary disease and possible underlying aortic stenosis.  Has held her warfarin since Tuesday.  Review of Systems: 10 point ROS was negative except as noted in the HPI.  Active Problems:   Shortness of breath   Chronic heart failure with preserved ejection fraction (HCC)   Coronary artery calcification seen on CT scan   Nonrheumatic aortic valve stenosis  Allergies  Allergen Reactions   Albuterol  Sulfate Other (See Comments)    A. Fib   Albuterol  Other (See Comments)   Shellfish Containing Products Nausea & Vomiting (ALLERGY/intolerance)   Ace Inhibitors Other (See Comments)    cough   Sulfa (Sulfonamide Antibiotics) Rash (ALLERGY/intolerance)    Prior to Admission medications   Medication Sig Start Date End Date Taking? Authorizing Provider  calcitrioL (ROCALTROL) 0.25 MCG capsule TAKE 1 CAPSULE EVERY DAY 05/09/21  Yes Sameea Sadiq, MD  dilTIAZem  (CARTIA  XT, CARDIZEM  CD) 120 MG 24 hr capsule Take 1 capsule (120 mg total) by mouth nightly. 05/27/21  Yes Amber Stephane Sacks, NP  levothyroxine  (SYNTHROID ) 50 MCG tablet TAKE 1 TABLET  DAILY AT 6 A.M. 05/03/21  Yes Michael John Piazza, MD  metoPROLOL  tartrate (LOPRESSOR ) 25 MG tablet Take 1 tablet (25 mg total) by mouth daily. 07/22/21  Yes Amber Stephane Sacks, NP  OXYGEN -AIR DELIVERY SYSTEMS MISC USES 2 LITERS  AS NEEDED DURING THE DAY   Yes Historical Provider, MD  potassium chloride  ER (KLOR-CON -M, K-DUR) 10 MEQ extended release tablet Take 1 tablet (10 mEq total) by mouth daily. 05/17/21  Yes Historical Provider, MD  rosuvastatin  (CRESTOR ) 10 MG tablet Take 1 tablet (10 mg total) by mouth daily. 05/09/21  Yes Courtney Elizabeth Cash, PA-C  tiotropium (SPIRIVA  WITH HANDIHALER) 18 mcg inhalation capsule Place 1 capsule into inhaler and inhale daily.   Yes Historical Provider, MD  torsemide  (DEMADEX ) 20 MG tablet Take 1 tablet (20 mg total) by mouth 2 times daily. 03/19/20  Yes Historical Provider, MD  traMADoL  (ULTRAM ) 50 mg tablet TAKING 1 TABLET ONCE OR TWICE DAILY 05/03/20  Yes Historical Provider, MD  coenzyme Q10 100 mg capsule Take 1 capsule (100 mg total) by mouth daily.    Historical Provider, MD  estradioL  (ESTRACE ) 0.01 % (0.1 mg/gram) vaginal cream USING AS NEEDED 06/23/15   Historical Provider, MD  magnesium  sulfate 100 mg Cap Take 1 capsule by mouth daily.     Historical Provider, MD  NON FORMULARY CPAP MACHINE: USES NIGHTLY    Historical Provider, MD  turmeric-turmeric root extract 450-50 mg Cap Take 500 mg by mouth daily.       Historical Provider, MD  warfarin (COUMADIN ) 3 MG tablet *ANTICOAGULANT* TAKE 1 TABLET EVERY DAY 04/19/20   Silvano Birdena Angelica, FNP   Past Medical History:  Diagnosis Date   Chronic kidney disease    Clotting disorder (HCC)    COPD (chronic obstructive pulmonary disease) (HCC)    Elevated glucose    Hypercholesterolemia    Hypertension    Pacemaker    Stroke (HCC)  Past Surgical History:  Procedure Laterality Date   AV NODE ABLATION     BREAST BIOPSY Left    Benign @ approx 32?    CARDIAC PACEMAKER PLACEMENT     CATARACT EXTRACTION Bilateral    HYSTERECTOMY     @37     Social History   Socioeconomic History   Marital status: Married    Spouse name: Not on file   Number of children: Not on file   Years of education: Not on file    Highest education level: Not on file  Occupational History   Not on file  Tobacco Use   Smoking status: Never   Smokeless tobacco: Never  Vaping Use   Vaping Use: Never used  Substance and Sexual Activity   Alcohol use: Yes    Comment: occasionally   Drug use: Never   Sexual activity: Never  Other Topics Concern   Not on file  Social History Narrative   Not on file   Social Determinants of Health   Financial Resource Strain: Not on file  Food Insecurity: Not on file  Transportation Needs: Not on file  Physical Activity: Not on file  Stress: Not on file  Social Connections: Not on file  Housing Stability: Not on file    Exam: BP 146/80   Pulse 101   Temp 98.3 F (36.8 C) (Oral)   Resp 18   Ht 1.499 m (4' 11)   Wt 89 kg (196 lb 4.8 oz)   SpO2 94%   BMI 39.65 kg/m  GENERAL: The patient is in no acute distress. NECK:  There is no JVD, or carotid bruits.   HEART: The heart has a regular rate and rhythm with no murmurs, gallops, or rubs.  Normal S1/S2. LUNGS: Clear to auscultation and percussion.  There are no rales, rhonchi, or wheezes. EXTREMITIES:  No clubbing, cyanosis, or edema. PULSES:  Faint right radial  NEUROLOGIC: The patient was oriented to person, place, and time.  No overt neurologic deficits were detected.  Labs: 07/21/2021: INR 2.00 (A; Ref range: 0.9 - 1.1) 08/10/2021: INR 2.30 (A; Ref range: 0.9 - 1.1)  Assessment: 88 y.o.female w/ obesity, COPD, pAF, SSS s/p DC-PPM, mild aortic stenosis, coronary calcification referred for left and right heart catheterization (simultaneous wedge/LV, LV/AO pullback, coronary angiography) to evaluate her shortness of breath.  Plan Procedure: -- left heart catheterization, coronary angiography, and possible PCI* -- right heart catheterization for assessment of central hemodynamics*  Planned Access:  -- right radial approach -- right internal jugular approach  Anticoagulation -- warfarin, INR  1.8  Antiplatelets -- only aspirin  Diabetes -- Yes   Stent Type: -- no known contraindications to DES*  Labs:  GFR 43  Allergies: Albuterol  sulfate, Albuterol , Shellfish containing products, Ace inhibitors, and Sulfa (sulfonamide antibiotics) No history of contrast allergy  Benefits and risks of cardiac catheterization including bleeding, infection, vascular injury, CVA, MI, ventricular arrhythmias, and death were discussed. All questions were answered to the patient and/or family's satisfaction. Consents have been signed for procedure, sedation, blood transfusion.   KYM Waddell Dan, MD Cardiovascular Medicine Fellow PGY6      Electronically signed by: Wynn Waddell Dan, MD Resident 08/12/21 [phone]    Electronically signed by: Hester Loges, MD 08/12/21 1349

## 2021-10-12 ENCOUNTER — Telehealth: Payer: Self-pay

## 2021-10-12 NOTE — Telephone Encounter (Signed)
I called the patient to verify if she wanted [place] to release her to [facility]. LMOVM for patient to return our call.

## 2021-11-18 NOTE — Telephone Encounter (Signed)
LMOVM for patient to give device clinic a call back.

## 2021-12-23 ENCOUNTER — Ambulatory Visit (INDEPENDENT_AMBULATORY_CARE_PROVIDER_SITE_OTHER): Payer: Medicare Other

## 2021-12-23 DIAGNOSIS — I495 Sick sinus syndrome: Secondary | ICD-10-CM

## 2021-12-26 LAB — CUP PACEART REMOTE DEVICE CHECK
Battery Remaining Longevity: 51 mo
Battery Voltage: 2.98 V
Brady Statistic AP VP Percent: 0.83 %
Brady Statistic AP VS Percent: 36.92 %
Brady Statistic AS VP Percent: 3.4 %
Brady Statistic AS VS Percent: 58.86 %
Brady Statistic RA Percent Paced: 36.39 %
Brady Statistic RV Percent Paced: 4.34 %
Date Time Interrogation Session: 20231020175756
Implantable Lead Implant Date: 20161214
Implantable Lead Implant Date: 20161214
Implantable Lead Location: 753859
Implantable Lead Location: 753860
Implantable Lead Model: 5076
Implantable Lead Model: 5076
Implantable Pulse Generator Implant Date: 20161214
Lead Channel Impedance Value: 418 Ohm
Lead Channel Impedance Value: 437 Ohm
Lead Channel Impedance Value: 456 Ohm
Lead Channel Impedance Value: 456 Ohm
Lead Channel Pacing Threshold Amplitude: 1.125 V
Lead Channel Pacing Threshold Amplitude: 1.75 V
Lead Channel Pacing Threshold Pulse Width: 0.4 ms
Lead Channel Pacing Threshold Pulse Width: 0.4 ms
Lead Channel Sensing Intrinsic Amplitude: 10.25 mV
Lead Channel Sensing Intrinsic Amplitude: 10.25 mV
Lead Channel Sensing Intrinsic Amplitude: 4.125 mV
Lead Channel Sensing Intrinsic Amplitude: 4.125 mV
Lead Channel Setting Pacing Amplitude: 2 V
Lead Channel Setting Pacing Amplitude: 3.5 V
Lead Channel Setting Pacing Pulse Width: 0.4 ms
Lead Channel Setting Sensing Sensitivity: 2.8 mV

## 2021-12-30 NOTE — Progress Notes (Signed)
Remote pacemaker transmission.   

## 2022-02-14 ENCOUNTER — Telehealth: Payer: Self-pay

## 2022-02-14 NOTE — Telephone Encounter (Signed)
LMOVM for patient to call to verify if she is being followed by [facility].

## 2022-04-12 ENCOUNTER — Emergency Department (HOSPITAL_COMMUNITY)
Admission: EM | Admit: 2022-04-12 | Discharge: 2022-04-12 | Disposition: A | Discharge status: Home / Self Care | Payer: Medicare Other | Attending: Emergency Medicine | Admitting: Emergency Medicine

## 2022-04-12 ENCOUNTER — Emergency Department (HOSPITAL_COMMUNITY): Payer: Medicare Other

## 2022-04-12 ENCOUNTER — Encounter (HOSPITAL_COMMUNITY): Payer: Self-pay

## 2022-04-12 DIAGNOSIS — Z950 Presence of cardiac pacemaker: Secondary | ICD-10-CM | POA: Insufficient documentation

## 2022-04-12 DIAGNOSIS — R140 Abdominal distension (gaseous): Secondary | ICD-10-CM | POA: Diagnosis not present

## 2022-04-12 DIAGNOSIS — S0990XA Unspecified injury of head, initial encounter: Secondary | ICD-10-CM

## 2022-04-12 DIAGNOSIS — Z23 Encounter for immunization: Secondary | ICD-10-CM | POA: Insufficient documentation

## 2022-04-12 DIAGNOSIS — J449 Chronic obstructive pulmonary disease, unspecified: Secondary | ICD-10-CM | POA: Insufficient documentation

## 2022-04-12 DIAGNOSIS — Y9301 Activity, walking, marching and hiking: Secondary | ICD-10-CM | POA: Insufficient documentation

## 2022-04-12 DIAGNOSIS — D649 Anemia, unspecified: Secondary | ICD-10-CM | POA: Diagnosis not present

## 2022-04-12 DIAGNOSIS — Z7952 Long term (current) use of systemic steroids: Secondary | ICD-10-CM | POA: Insufficient documentation

## 2022-04-12 DIAGNOSIS — R55 Syncope and collapse: Secondary | ICD-10-CM | POA: Insufficient documentation

## 2022-04-12 DIAGNOSIS — R7989 Other specified abnormal findings of blood chemistry: Secondary | ICD-10-CM | POA: Diagnosis not present

## 2022-04-12 DIAGNOSIS — I2510 Atherosclerotic heart disease of native coronary artery without angina pectoris: Secondary | ICD-10-CM | POA: Insufficient documentation

## 2022-04-12 DIAGNOSIS — N189 Chronic kidney disease, unspecified: Secondary | ICD-10-CM | POA: Diagnosis not present

## 2022-04-12 DIAGNOSIS — I129 Hypertensive chronic kidney disease with stage 1 through stage 4 chronic kidney disease, or unspecified chronic kidney disease: Secondary | ICD-10-CM | POA: Insufficient documentation

## 2022-04-12 DIAGNOSIS — Z79899 Other long term (current) drug therapy: Secondary | ICD-10-CM | POA: Insufficient documentation

## 2022-04-12 DIAGNOSIS — Z7901 Long term (current) use of anticoagulants: Secondary | ICD-10-CM | POA: Diagnosis not present

## 2022-04-12 DIAGNOSIS — W01198A Fall on same level from slipping, tripping and stumbling with subsequent striking against other object, initial encounter: Secondary | ICD-10-CM | POA: Diagnosis not present

## 2022-04-12 DIAGNOSIS — Z20822 Contact with and (suspected) exposure to covid-19: Secondary | ICD-10-CM | POA: Insufficient documentation

## 2022-04-12 DIAGNOSIS — S0081XA Abrasion of other part of head, initial encounter: Secondary | ICD-10-CM | POA: Insufficient documentation

## 2022-04-12 DIAGNOSIS — W19XXXA Unspecified fall, initial encounter: Secondary | ICD-10-CM

## 2022-04-12 DIAGNOSIS — R531 Weakness: Secondary | ICD-10-CM | POA: Diagnosis not present

## 2022-04-12 LAB — COMPREHENSIVE METABOLIC PANEL
ALT: 12 U/L (ref 0–44)
AST: 18 U/L (ref 15–41)
Albumin: 3.5 g/dL (ref 3.5–5.0)
Alkaline Phosphatase: 64 U/L (ref 38–126)
Anion gap: 11 (ref 5–15)
BUN: 28 mg/dL — ABNORMAL HIGH (ref 8–23)
CO2: 31 mmol/L (ref 22–32)
Calcium: 9 mg/dL (ref 8.9–10.3)
Chloride: 97 mmol/L — ABNORMAL LOW (ref 98–111)
Creatinine, Ser: 1.47 mg/dL — ABNORMAL HIGH (ref 0.44–1.00)
GFR, Estimated: 34 mL/min — ABNORMAL LOW (ref >60)
Glucose, Bld: 127 mg/dL — ABNORMAL HIGH (ref 70–99)
Potassium: 3.4 mmol/L — ABNORMAL LOW (ref 3.5–5.1)
Sodium: 139 mmol/L (ref 135–145)
Total Bilirubin: 0.6 mg/dL (ref 0.3–1.2)
Total Protein: 6 g/dL — ABNORMAL LOW (ref 6.5–8.1)

## 2022-04-12 LAB — CBC
HCT: 36.5 % (ref 36.0–46.0)
Hemoglobin: 11.7 g/dL — ABNORMAL LOW (ref 12.0–15.0)
MCH: 30.7 pg (ref 26.0–34.0)
MCHC: 32.1 g/dL (ref 30.0–36.0)
MCV: 95.8 fL (ref 80.0–100.0)
Platelets: 236 10*3/uL (ref 150–400)
RBC: 3.81 MIL/uL — ABNORMAL LOW (ref 3.87–5.11)
RDW: 15.9 % — ABNORMAL HIGH (ref 11.5–15.5)
WBC: 9.1 10*3/uL (ref 4.0–10.5)
nRBC: 0 % (ref 0.0–0.2)

## 2022-04-12 LAB — PROTIME-INR
INR: 1.5 — ABNORMAL HIGH (ref 0.8–1.2)
Prothrombin Time: 18.2 seconds — ABNORMAL HIGH (ref 11.4–15.2)

## 2022-04-12 LAB — I-STAT CHEM 8, ED
BUN: 29 mg/dL — ABNORMAL HIGH (ref 8–23)
Calcium, Ion: 1.22 mmol/L (ref 1.15–1.40)
Chloride: 96 mmol/L — ABNORMAL LOW (ref 98–111)
Creatinine, Ser: 1.5 mg/dL — ABNORMAL HIGH (ref 0.44–1.00)
Glucose, Bld: 116 mg/dL — ABNORMAL HIGH (ref 70–99)
HCT: 36 % (ref 36.0–46.0)
Hemoglobin: 12.2 g/dL (ref 12.0–15.0)
Potassium: 3.4 mmol/L — ABNORMAL LOW (ref 3.5–5.1)
Sodium: 141 mmol/L (ref 135–145)
TCO2: 30 mmol/L (ref 22–32)

## 2022-04-12 LAB — MAGNESIUM: Magnesium: 2.2 mg/dL (ref 1.7–2.4)

## 2022-04-12 LAB — TROPONIN I (HIGH SENSITIVITY)
Troponin I (High Sensitivity): 18 ng/L — ABNORMAL HIGH (ref <18)
Troponin I (High Sensitivity): 34 ng/L — ABNORMAL HIGH (ref <18)
Troponin I (High Sensitivity): 36 ng/L — ABNORMAL HIGH (ref <18)

## 2022-04-12 LAB — RESP PANEL BY RT-PCR (RSV, FLU A&B, COVID)  RVPGX2
Influenza A by PCR: NEGATIVE
Influenza B by PCR: NEGATIVE
Resp Syncytial Virus by PCR: NEGATIVE
SARS Coronavirus 2 by RT PCR: NEGATIVE

## 2022-04-12 LAB — TSH: TSH: 1.351 u[IU]/mL (ref 0.350–4.500)

## 2022-04-12 LAB — LACTIC ACID, PLASMA: Lactic Acid, Venous: 2 mmol/L (ref 0.5–1.9)

## 2022-04-12 MED ORDER — TETANUS-DIPHTH-ACELL PERTUSSIS 5-2.5-18.5 LF-MCG/0.5 IM SUSY
0.5000 mL | PREFILLED_SYRINGE | Freq: Once | INTRAMUSCULAR | Status: AC
  Administered 2022-04-12: 0.5 mL via INTRAMUSCULAR
  Filled 2022-04-12: qty 0.5

## 2022-04-12 MED ORDER — LACTATED RINGERS IV BOLUS
500.0000 mL | Freq: Once | INTRAVENOUS | Status: AC
  Administered 2022-04-12: 500 mL via INTRAVENOUS

## 2022-04-12 MED ORDER — ONDANSETRON HCL 4 MG/2ML IJ SOLN
4.0000 mg | Freq: Once | INTRAMUSCULAR | Status: AC
  Administered 2022-04-12: 4 mg via INTRAVENOUS
  Filled 2022-04-12: qty 2

## 2022-04-12 NOTE — ED Notes (Signed)
Pacemaker interrogation results fax received, given to Dr. Doren Custard

## 2022-04-12 NOTE — Discharge Instructions (Signed)
Based on the events which brought you to the ER today, it is possible that you may have a concussion. A concussion occurs when there is a blow to the head or body, with enough force to shake the brain and disrupt how the brain functions. You may experience symptoms such as headaches, sensitivity to light/noise, dizziness, cognitive slowing, difficulty concentrating / remembering, trouble sleeping and drowsiness. These symptoms may last anywhere from hours/days to potentially weeks/months. While these symptoms are very frustrating and perhaps debilitating, it is important that you remember that they will improve over time. Everyone has a different rate of recovery; it is difficult to predict when your symptoms will resolve. In order to allow for your brain to heal after the injury, we recommend that you see your primary physician or a physician knowledgeable in concussion management. We also advise you to let your body and brain rest: avoid physical activities (sports, gym, and exercise) and reduce cognitive demands (reading, texting, TV watching, computer use, video games, etc). School attendance, after-school activities and work may need to be modified to avoid increasing symptoms. We recommend against driving until until all symptoms have resolved. Come back to the ER right away if you are having repeated episodes of vomiting, severe/worsening headache/dizziness or any other symptom that alarms you. We recommended that someone stay with you for the next 24 hours to monitor for these worrisome symptoms.   It was a pleasure caring for you today in the emergency department.  Please return to the emergency department for any worsening or worrisome symptoms.

## 2022-04-12 NOTE — ED Notes (Signed)
Pacemaker interrogated; awaiting faxed results

## 2022-04-12 NOTE — ED Triage Notes (Signed)
Pt bib ems from [facility]; got up this am, walking , R knee gave out, fell forward, hit head on plastic box on floor; lac above R eye; no loc, denies head, neck, back pain; pt on warfarin; c/o feeling "off", lightheaded; tenderness to L chest above pacemaker; hx afib; pt a and o x 4, answers questions appropriately; pt states she had an episode of "R hand not working" unable to grab object, lasted a few minutes; did not take thinner this am; 138/62, HR 90 irreg; 97% RA, cbg 144; 18 ga LAC

## 2022-04-12 NOTE — ED Provider Notes (Signed)
[facility] Provider Note   CSN: [id number] Arrival date & time: 04/12/22  5053     History  Chief Complaint  Patient presents with   Lytle Michaels    Lynn Dunn is a 89 y.o. female.  HPI Patient presents after a fall.  She states that this morning, she felt generalized weakness and nausea when standing.  She had to hold on the wall for balance.  As she was walking, her right knee gave out from under her.  This caused her to fall.  She did strike the right anterior aspect of her head.  She is on warfarin.  She currently endorses generalized pain.  Patient lives alone in an independent living facility.  She has a motorized wheelchair for getting around the premises.  When she is in her apartment, she typically holds onto things for balance.  She has remote history of CVA.  At the time, she had speech difficulty.  She did get thrombolysis and has not had any residual effects from her prior CVA.  She is on warfarin.  Yesterday, she had a transient episode of right hand weakness.  Today, she seemed more off balance with ambulation than normal.    Home Medications Prior to Admission medications   Medication Sig Start Date End Date Taking? Authorizing Provider  calcitRIOL (ROCALTROL) 0.25 MCG capsule Take 0.25 mcg by mouth daily.  05/07/17   [provider]  Coenzyme Q10 (COQ10 PO) Take 1 capsule by mouth at bedtime.     [provider]  diltiazem (CARDIZEM CD) 120 MG 24 hr capsule TAKE 1 CAPSULE EVERY DAY (KEEP MARCH APPOINTMENT FOR REFILLS) 05/02/21   Jettie Booze, MD  estradiol [facility] VAGINAL) 0.1 MG/GM vaginal cream Apply pea-sized amount to vagina / urethra 2-3 x weekly to prevent labial adhesions 06/23/15   Alexis Frock, MD  HYDROcodone-acetaminophen (NORCO/VICODIN) 5-325 MG tablet Take 1 tablet by mouth as needed for pain. 01/09/20   [provider]  levothyroxine (SYNTHROID, LEVOTHROID) 50 MCG tablet Take 50 mcg  by mouth daily before breakfast.  05/13/18   [provider]  Magnesium 100 MG CAPS Take 100 mg by mouth 2 (two) times daily.     [provider]  metoprolol tartrate (LOPRESSOR) 25 MG tablet Taking 4 tablets by mouth twice daily 12/01/19   [provider]  potassium chloride SA (K-DUR,KLOR-CON) 20 MEQ tablet Take 20 mEq by mouth daily.     [provider]  predniSONE 5 MG TBEC Take 5 mg by mouth daily. 06/23/19   Collene Gobble, MD  rosuvastatin (CRESTOR) 10 MG tablet Take 10 mg by mouth at bedtime.  09/14/16   [provider]  torsemide (DEMADEX) 20 MG tablet TAKE 1 TABLET TWICE DAILY 03/28/21   Jettie Booze, MD  Turmeric 500 MG CAPS Take 500 mg by mouth 2 (two) times daily.    [provider]  warfarin (COUMADIN) 3 MG tablet Take 1.5-3 mg by mouth See admin instructions. Take 1.5 mg by mouth in the evening on Sun/Wed and 3 mg on Mon/Tues/Thurs/Fri/Sat    [provider]      Allergies    Ace inhibitors, Albuterol, Albuterol sulfate, Oysters [shellfish allergy], and Sulfa antibiotics    Review of Systems   Review of Systems  Gastrointestinal:  Positive for nausea.  Musculoskeletal:  Positive for arthralgias.  Skin:  Positive for wound.  Neurological:  Positive for weakness (Generalized).  All other systems  reviewed and are negative.   Physical Exam Updated Vital Signs BP (!) 128/53   Pulse 72   Temp 97.9 F (36.6 C) (Oral)   Resp 15   Ht '5\' 8"'$  (1.727 m)   Wt 86.2 kg   LMP  (LMP Unknown)   SpO2 98%   BMI 28.89 kg/m  Physical Exam Vitals and nursing note reviewed.  Constitutional:      General: She is not in acute distress.    Appearance: Normal appearance. She is well-developed. She is not toxic-appearing or diaphoretic.  HENT:     Head: Normocephalic.     Comments: Abrasion to right forehead    Right Ear: External ear normal.     Left Ear: External ear normal.     Nose: Nose normal.     Mouth/Throat:      Mouth: Mucous membranes are moist.  Eyes:     Extraocular Movements: Extraocular movements intact.     Conjunctiva/sclera: Conjunctivae normal.  Cardiovascular:     Rate and Rhythm: Normal rate and regular rhythm.     Heart sounds: No murmur heard. Pulmonary:     Effort: Pulmonary effort is normal. No respiratory distress.     Breath sounds: Normal breath sounds. No wheezing or rales.  Abdominal:     General: There is distension.     Palpations: Abdomen is soft.     Tenderness: There is no abdominal tenderness.  Musculoskeletal:        General: No swelling. Normal range of motion.     Cervical back: Normal range of motion and neck supple.  Skin:    General: Skin is warm and dry.     Capillary Refill: Capillary refill takes less than 2 seconds.     Coloration: Skin is not jaundiced or pale.  Neurological:     General: No focal deficit present.     Mental Status: She is alert and oriented to person, place, and time.     Cranial Nerves: No cranial nerve deficit.     Sensory: No sensory deficit.     Motor: No weakness.     Coordination: Coordination normal.  Psychiatric:        Mood and Affect: Mood normal.        Behavior: Behavior normal.        Thought Content: Thought content normal.        Judgment: Judgment normal.     ED Results / Procedures / Treatments   Labs (all labs ordered are listed, but only abnormal results are displayed) Labs Reviewed  COMPREHENSIVE METABOLIC PANEL - Abnormal; Notable for the following components:      Result Value   Potassium 3.4 (*)    Chloride 97 (*)    Glucose, Bld 127 (*)    BUN 28 (*)    Creatinine, Ser 1.47 (*)    Total Protein 6.0 (*)    GFR, Estimated 34 (*)    All other components within normal limits  CBC - Abnormal; Notable for the following components:   RBC 3.81 (*)    Hemoglobin 11.7 (*)    RDW 15.9 (*)    All other components within normal limits  PROTIME-INR - Abnormal; Notable for the following components:    Prothrombin Time 18.2 (*)    INR 1.5 (*)    All other components within normal limits  LACTIC ACID, PLASMA - Abnormal; Notable for the following components:   Lactic Acid, Venous 2.0 (*)    All  other components within normal limits  I-STAT CHEM 8, ED - Abnormal; Notable for the following components:   Potassium 3.4 (*)    Chloride 96 (*)    BUN 29 (*)    Creatinine, Ser 1.50 (*)    Glucose, Bld 116 (*)    All other components within normal limits  TROPONIN I (HIGH SENSITIVITY) - Abnormal; Notable for the following components:   Troponin I (High Sensitivity) 18 (*)    All other components within normal limits  TROPONIN I (HIGH SENSITIVITY) - Abnormal; Notable for the following components:   Troponin I (High Sensitivity) 34 (*)    All other components within normal limits  RESP PANEL BY RT-PCR (RSV, FLU A&B, COVID)  [id number]  MAGNESIUM  URINALYSIS, ROUTINE W REFLEX MICROSCOPIC  TSH  CBG MONITORING, ED    EKG None  Radiology CT CHEST ABDOMEN PELVIS WO CONTRAST  Result Date: 04/12/2022 CLINICAL DATA:  89 year old female status post fall. EXAM: CT CHEST, ABDOMEN AND PELVIS WITHOUT CONTRAST TECHNIQUE: Multidetector CT imaging of the chest, abdomen and pelvis was performed following the standard protocol without IV contrast. RADIATION DOSE REDUCTION: This exam was performed according to the departmental dose-optimization program which includes automated exposure control, adjustment of the mA and/or kV according to patient size and/or use of iterative reconstruction technique. COMPARISON:  Cervical spine CT today. Noncontrast chest CT 05/26/2021. CT Abdomen and Pelvis 03/09/2022. FINDINGS: CT CHEST FINDINGS Cardiovascular: Chronic left chest pacemaker. Calcified aortic atherosclerosis. Calcified coronary artery atherosclerosis. Stable mild cardiomegaly. No pericardial effusion. Vascular patency is not evaluated in the absence of IV contrast. Mediastinum/Nodes: No mediastinal hematoma or  lymphadenopathy on this noncontrast exam. Lungs/Pleura: Mildly lower lung volumes. Mildly increased bilateral middle and lower lobe atelectasis compared to last year, but overall no significant change in ventilation. Chronic occasionally calcified pleural plaques. No pneumothorax, pleural effusion, or pulmonary contusion identified. Major airways remain patent. Musculoskeletal: Diffuse advanced thoracic disc and endplate degeneration. Healed chronic upper sternal fracture. Grossly intact visible shoulder osseous structures. Chronic right anterior 3rd and 4th rib fractures are stable. No acute rib fracture identified. CT ABDOMEN PELVIS FINDINGS Hepatobiliary: Negative noncontrast liver and gallbladder. No perihepatic fluid identified. Pancreas: Fatty atrophied. Spleen: Stable with no perisplenic fluid identified. Adrenals/Urinary Tract: Normal adrenal glands. Stable nonobstructed kidneys. No nephrolithiasis. Unremarkable bladder. Chronic pelvic phleboliths. Stomach/Bowel: Redundant large bowel with retained stool. Widespread diverticulosis from the splenic flexure distally, also at the hepatic flexure. No large bowel inflammation. Cecum is on a lax mesentery. Several cecal diverticula also. Diminutive and normal appendix on coronal image 34. Decompressed terminal ileum. No dilated small bowel. Decompressed stomach. Small diverticula of the distal duodenum. No free air, free fluid, or mesenteric inflammation identified. Vascular/Lymphatic: Extensive Aortoiliac calcified atherosclerosis. Abdominal aortic caliber remains normal. Vascular patency is not evaluated in the absence of IV contrast. No lymphadenopathy identified. Reproductive: Absent uterus. Chronic simple fluid density 2 cm right adnexal cyst has been present since 2012 (no follow-up imaging recommended). Left ovary remains diminutive or is absent. Other: No pelvis free fluid. Musculoskeletal: Chronically augmented L1 compression fracture. Stable lumbar  spine, underlying advanced chronic disc and endplate degeneration. Sacrum, SI joints, pelvis, and proximal femurs appear stable and intact. Chronic pubic symphysis degeneration. IMPRESSION: 1. No acute traumatic injury identified on this noncontrast exam. 2. Stable CT appearance of the chest, abdomen, and pelvis. Aortic Atherosclerosis (ICD10-I70.0). Electronically Signed   By: Genevie Ann M.D.   On: 04/12/2022 10:47   CT CERVICAL SPINE WO CONTRAST  Result  Date: 04/12/2022 CLINICAL DATA:  89 year old female status post fall. EXAM: CT CERVICAL SPINE WITHOUT CONTRAST TECHNIQUE: Multidetector CT imaging of the cervical spine was performed without intravenous contrast. Multiplanar CT image reconstructions were also generated. RADIATION DOSE REDUCTION: This exam was performed according to the departmental dose-optimization program which includes automated exposure control, adjustment of the mA and/or kV according to patient size and/or use of iterative reconstruction technique. COMPARISON:  Head CT today. FINDINGS: Alignment: Straightening of cervical lordosis. Cervicothoracic junction alignment is within normal limits. Bilateral posterior element alignment is within normal limits. Skull base and vertebrae: Visualized skull base is intact. No atlanto-occipital dissociation. C1 and C2 appear intact and aligned. No acute osseous abnormality identified. Soft tissues and spinal canal: No prevertebral fluid or swelling. No visible canal hematoma. Retropharyngeal carotids with bulky calcified atherosclerosis bilaterally. Disc levels: Widespread chronic cervical disc and endplate degeneration. But mild if any associated cervical spinal stenosis. Upper chest: Advanced upper thoracic disc and endplate degeneration also with pronounced disc space loss, endplate sclerosis. Visible upper thoracic levels appear intact. Left chest subclavian approach cardiac pacemaker. Partially visible calcified aortic and proximal great vessel  atherosclerosis. Lung apices are clear. IMPRESSION: 1. No acute traumatic injury identified in the cervical spine. 2. Widespread cervical and upper thoracic chronic disc and endplate degeneration. Electronically Signed   By: Genevie Ann M.D.   On: 04/12/2022 10:35   CT HEAD WO CONTRAST  Result Date: 04/12/2022 CLINICAL DATA:  89 year old female status post fall. EXAM: CT HEAD WITHOUT CONTRAST TECHNIQUE: Contiguous axial images were obtained from the base of the skull through the vertex without intravenous contrast. RADIATION DOSE REDUCTION: This exam was performed according to the departmental dose-optimization program which includes automated exposure control, adjustment of the mA and/or kV according to patient size and/or use of iterative reconstruction technique. COMPARISON:  Brain MRI 09/03/2007.  Head CT 02/02/2022. FINDINGS: Brain: Stable cerebral volume from last year. No midline shift, ventriculomegaly, mass effect, evidence of mass lesion, intracranial hemorrhage or evidence of cortically based acute infarction. Patchy and confluent bilateral cerebral white matter hypodensity, with comparatively mild heterogeneity in the bilateral deep gray matter, appears stable from last year. No definite cortical encephalomalacia. Vascular: Calcified atherosclerosis at the skull base.6 No suspicious intracranial vascular hyperdensity. 6 Skull: No acute osseous abnormality identified. Chronic left TMJ degeneration. Sinuses/Orbits: Visualized paranasal sinuses and mastoids are stable and well aerated. Other: Mild right forehead scalp hematoma or contusion series 4, image 36. Underlying right frontal bone and sinus appear intact. Other orbit and scalp soft tissues appears stable. IMPRESSION: 1. Mild right forehead scalp soft tissue injury without underlying skull fracture. 2. No acute intracranial abnormality. Stable non contrast CT appearance of chronic small vessel disease. Electronically Signed   By: Genevie Ann M.D.   On:  04/12/2022 10:32   DG Pelvis Portable  Result Date: 04/12/2022 CLINICAL DATA:  Trauma.  Fall. EXAM: PORTABLE PELVIS 1-2 VIEWS COMPARISON:  None Available. FINDINGS: No acute fracture or pelvic diastasis is identified. There is mild hip joint space narrowing bilaterally. No focal soft tissue abnormality is identified. IMPRESSION: No acute osseous abnormality identified. Electronically Signed   By: Logan Bores M.D.   On: 04/12/2022 10:02   DG Chest Port 1 View  Result Date: 04/12/2022 CLINICAL DATA:  Fall with tenderness to left chest above the pacemaker site EXAM: PORTABLE CHEST 1 VIEW COMPARISON:  Chest radiograph dated 04/28/2021 FINDINGS: Lines/tubes: Left chest wall pacemaker leads project over the right atrium and ventricle. Chest: Right mid  lung linear opacity.  No focal consolidations. Pleura: No pneumothorax or pleural effusion. Heart/mediastinum: Similar  cardiomediastinal silhouette. Bones: No acute osseous abnormality. IMPRESSION: 1. Right mid lung linear opacity, likely atelectasis. No pneumothorax or pleural effusion. 2.  No radiographic finding of acute displaced fracture. Electronically Signed   By: Darrin Nipper M.D.   On: 04/12/2022 10:00   DG Knee Right Port  Result Date: 04/12/2022 CLINICAL DATA:  Follow-up blood thinners. EXAM: PORTABLE RIGHT KNEE - 1-2 VIEW COMPARISON:  None Available. FINDINGS: No fracture or dislocation of the knee. Small suprapatellar joint effusion. Vascular calcifications. Osteophytic spurring of the medial and lateral compartment. IMPRESSION: 1. No fracture or  dislocation. 2. Small joint effusion. 3. Osteoarthritis medial and lateral compartment. Electronically Signed   By: Suzy Bouchard M.D.   On: 04/12/2022 10:00    Procedures Procedures    Medications Ordered in ED Medications  Tdap (BOOSTRIX) injection 0.5 mL (0.5 mLs Intramuscular Given 04/12/22 1006)  ondansetron (ZOFRAN) injection 4 mg (4 mg Intravenous Given 04/12/22 1004)  lactated ringers bolus 500  mL (500 mLs Intravenous New Bag/Given 04/12/22 1421)    ED Course/ Medical Decision Making/ A&P Clinical Course as of 04/12/22 1546  Wed Apr 12, 2022  1514 Near syncope, fall, orthostatic, +/- go home after fluids [SG]    Clinical Course User Index [SG] Jeanell Sparrow, DO                             Medical Decision Making Amount and/or Complexity of Data Reviewed Labs: ordered. Radiology: ordered. ECG/medicine tests: ordered.  Risk Prescription drug management.   This patient presents to the ED for concern of fall, this involves an extensive number of treatment options, and is a complaint that carries with it a high risk of complications and morbidity.  The differential diagnosis includes acute injuries, near syncope, dehydration, polypharmacy, arrhythmia, valvular abnormalities   Co morbidities that complicate the patient evaluation  HTN, atrial fibrillation, OSA, prior GI bleed, anemia, CVA, HLD, COPD, sick sinus syndrome s/p pacemaker placement   Additional history obtained:  Additional history obtained from EMS External records from outside source obtained and reviewed including EMR   Lab Tests:  I Ordered, and personally interpreted labs.  The pertinent results include: Baseline anemia, no leukocytosis, baseline CKD, normal electrolytes, mild elevation in troponin   Imaging Studies ordered:  I ordered imaging studies including x-ray of chest, pelvis, right knee; CT of head, cervical spine, chest, abdomen, pelvis I independently visualized and interpreted imaging which showed no acute osseous or intracranial findings. I agree with the radiologist interpretation   Cardiac Monitoring: / EKG:  The patient was maintained on a cardiac monitor.  I personally viewed and interpreted the cardiac monitored which showed an underlying rhythm of: Sinus rhythm  Problem List / ED Course / Critical interventions / Medication management  Patient presents after ground-level  fall.  This occurred at home.  She states that she felt off balance and then fell to her right knee gave out from under her.  She is on warfarin for history of atrial fibrillation.  She is alert and oriented on arrival.  Breathing is unlabored.  Lungs are clear to auscultation and breath sounds are present bilaterally.  She has a mild abrasion to her right forehead.  She has no other external evidence of trauma.  Patient was placed on bedside cardiac monitor.  She underwent lab work and trauma imaging studies.  Trauma imaging studies did not show any acute osseous or intracranial injuries.  Vital signs during ED observation are notable for widened pulse pressure.  Per chart review, she underwent echocardiogram 2 years ago.  At the time, she had preserved EF with mild diastolic dysfunction, mild aortic stenosis and trivial aortic valve regurgitation.  Patient has a history of pacemaker placement and pacemaker was interrogated while in the ED.  Results of interrogation did not show any recent events.  Lab work is unremarkable.  Patient underwent checking orthostatic vital signs.  She did have a drop in her SBP with sitting and standing.  She does take torsemide daily and is likely volume depleted.  Bolus of IV fluids was ordered.  I spoke with the patient about her reassuring imaging studies and lab work.  Given her near syncopal symptoms, patient was offered admission for syncope observation and repeat echocardiogram.  She stated that she would prefer to go home.  Regarding her transient right hand weakness yesterday, this does raise concern for TIA.  She was offered MRI, however, notified that there may be to delay given her pacemaker.  She declined MRI.  She is already on warfarin.  Plan will be for IV fluids and reassessment of her orthostatics and gait stability.  Care of patient signed out to oncoming ED provider. I ordered medication including Tdap for tetanus prophylaxis; Zofran for nausea; IV fluids for  hydration Reevaluation of the patient after these medicines showed that the patient improved I have reviewed the patients home medicines and have made adjustments as needed   Social Determinants of Health:  Resides in independent living facility         Final Clinical Impression(s) / ED Diagnoses Final diagnoses:  Fall, initial encounter  Near syncope    Rx / DC Orders ED Discharge Orders     None         Godfrey Pick, MD 04/12/22 1547

## 2022-04-12 NOTE — ED Notes (Signed)
Patient transported to CT 

## 2022-04-12 NOTE — ED Notes (Signed)
Patient ambulated well in hallway with walker. No complaints of light-headedness or dizziness.

## 2022-04-12 NOTE — ED Notes (Signed)
Got patient undressed into a gown on the monitor did EKG shown to er provider patient is resting with call bell in reach 

## 2022-04-12 NOTE — ED Notes (Signed)
Food and beverage given per Dr. Doren Custard

## 2022-04-12 NOTE — ED Provider Notes (Signed)
  Provider Note MRN:  [id number]  Arrival date & time: 04/12/22    ED Course and Medical Decision Making  Assumed care from [facility] at shift change.  See note from prior team for complete details, in brief:  89 year old female history of hypertension, A-fib sick sinus syndrome, CAD Here to the ED secondary to fall, near syncope. Patient fell forward, struck her head on the floor, no LOC she is on Coumadin Blood pressure was low, given fluids Trop mildly elevated, 18 > 34, no chest pain Imaging reviewed, no fx  Plan per prior physician recheck BP after fluids, ambulate for dispo  Labs stable, trop flat, feeling better, bp stable, ambulated w/o assistance with walker (baseline)  Concussion precautions advised, encouraged hydration, f/u with pcp  The patient improved significantly and was discharged in stable condition. Detailed discussions were had with the patient regarding current findings, and need for close f/u with PCP or on call doctor. The patient has been instructed to return immediately if the symptoms worsen in any way for re-evaluation. Patient verbalized understanding and is in agreement with current care plan. All questions answered prior to discharge.    Procedures  Final Clinical Impressions(s) / ED Diagnoses     ICD-10-CM   1. Fall, initial encounter  W19.XXXA     2. Near syncope  R55     3. Minor head injury, initial encounter  S09.90XA       ED Discharge Orders     None         Discharge Instructions      Based on the events which brought you to the ER today, it is possible that you may have a concussion. A concussion occurs when there is a blow to the head or body, with enough force to shake the brain and disrupt how the brain functions. You may experience symptoms such as headaches, sensitivity to light/noise, dizziness, cognitive slowing, difficulty concentrating / remembering, trouble sleeping and drowsiness. These symptoms may last anywhere from  hours/days to potentially weeks/months. While these symptoms are very frustrating and perhaps debilitating, it is important that you remember that they will improve over time. Everyone has a different rate of recovery; it is difficult to predict when your symptoms will resolve. In order to allow for your brain to heal after the injury, we recommend that you see your primary physician or a physician knowledgeable in concussion management. We also advise you to let your body and brain rest: avoid physical activities (sports, gym, and exercise) and reduce cognitive demands (reading, texting, TV watching, computer use, video games, etc). School attendance, after-school activities and work may need to be modified to avoid increasing symptoms. We recommend against driving until until all symptoms have resolved. Come back to the ER right away if you are having repeated episodes of vomiting, severe/worsening headache/dizziness or any other symptom that alarms you. We recommended that someone stay with you for the next 24 hours to monitor for these worrisome symptoms.   It was a pleasure caring for you today in the emergency department.  Please return to the emergency department for any worsening or worrisome symptoms.          Jeanell Sparrow, DO 04/12/22 1939

## 2022-04-12 NOTE — Progress Notes (Signed)
Orthopedic Tech Progress Note Patient Details:  Lynn Dunn [birth year] [id number] Level 2 Trauma  Patient ID: [name], female   DOB: September 13, 1930, 89 y.o.   MRN: [id number]  [name] 04/12/2022, 9:41 AM

## 2022-04-12 NOTE — ED Notes (Addendum)
Trauma Response Nurse Documentation  Lynn Dunn is a 89 y.o. female arriving to [facility] ED via EMS  On warfarin daily. Trauma was activated as a Level 2 based on the following trauma criteria Elderly patients > 65 with head trauma on anti-coagulation (excluding ASA). Trauma team at the bedside on patient arrival.   Patient cleared for CT by Dr. Scot Dock. Pt transported to CT with trauma response nurse present to monitor. RN remained with the patient throughout their absence from the department for clinical observation.   GCS 15.  History   Past Medical History:  Diagnosis Date   Airway malacia    Anemia    Anginal pain ([place]) 2011   had to take nitro    Arthritis    COPD (chronic obstructive pulmonary disease) (HCC)    Dysuria    GERD (gastroesophageal reflux disease)    Hematuria    HTN (hypertension)    Hx of echocardiogram    Echo (12/15):  Mild LVH, EF 60-65%, mild MR, mod LAE (LA 49 mm), PASP 43 mmHg   Obesity    OSA (obstructive sleep apnea)    cpap at home   PAF (paroxysmal atrial fibrillation) ([place])    s/p ablation 07/29/09   Sick sinus syndrome (HCC)    pauses with syncope   Stroke ([place]) 6/09   NO RESIDUAL PROBLEMS     Past Surgical History:  Procedure Laterality Date   ABDOMINAL HYSTERECTOMY     ATRIAL ABLATION SURGERY     s/p ablation for afib 07/29/09   CYSTOSCOPY N/A 06/23/2015   Procedure: CYSTOSCOPY;  Surgeon: Alexis Frock, MD;  Location: [facility] ORS;  Service: Urology;  Laterality: N/A;   EP IMPLANTABLE DEVICE N/A 02/17/2015   MDT Advisa MRI PPM implanted by Dr Lovena Le for sick sinus and syncope   EP IMPLANTABLE DEVICE N/A 02/17/2015   Procedure: Loop Recorder Removal;  Surgeon: Evans Lance, MD;  Location: [place] CV LAB;  Service: Cardiovascular;  Laterality: N/A;   ESOPHAGOGASTRODUODENOSCOPY  01/24/2012   Procedure: ESOPHAGOGASTRODUODENOSCOPY (EGD);  Surgeon: Jeryl Columbia, MD;  Location: [facility] ENDOSCOPY;  Service: Endoscopy;  Laterality: N/A;   KNEE  ARTHROSCOPY     BILATERAL   LOOP RECORDER IMPLANT N/A 03/12/2014   Procedure: LOOP RECORDER IMPLANT;  Surgeon: Thompson Grayer, MD;  Location: [facility] CATH LAB;  Service: Cardiovascular;  Laterality: N/A;   LYSIS OF ADHESION N/A 06/23/2015   Procedure: LYSIS OF LABIAL ADHESION;  Surgeon: Alexis Frock, MD;  Location: [facility] ORS;  Service: Urology;  Laterality: N/A;   VIDEO BRONCHOSCOPY Bilateral 12/15/2016   Procedure: VIDEO BRONCHOSCOPY WITHOUT FLUORO;  Surgeon: Collene Gobble, MD;  Location: [facility] ENDOSCOPY;  Service: Cardiopulmonary;  Laterality: Bilateral;      CT's Completed:   CT Head   Interventions: Tdap, Zofran, Cleaned abrasion and dressed. Labs, Xrays, CT.   Plan for disposition:  Pending   Event Summary: Patient A/O x4, VSS, manual BP completed, small abrasion to the right forehead noted, Pupils equal, round (2) and reactive,  mass noted to the inside of bilateral ankles,  No pain at this time. Patient was transported to CT. Xrays completed. Abrasion to forehead was cleaned and Band-Aid applied. Patient sister was notified. Belongs: Pocket Book, Cell Phone, Clothing, necklace, Ring and shoes.    Bedside handoff with ED RN [place].    [place] RN  Please call TRN at [phone] for further assistance.

## 2023-01-04 NOTE — Progress Notes (Signed)
 [facility] Cardiovascular [place] Outpatient Visit  PCP: Jeoffrey Stephane Sacks, [place]  Reason for Visit:   CAD  Assessment/Plan   1. Atherosclerosis of native coronary artery of native heart without angina pectoris   2. Essential hypertension, benign   3. Mixed hyperlipidemia   4. Cardiac pacemaker   5. Chronic atrial fibrillation (CMD)    Doing well from a cardiac standpoint No new complaints     CAD, chronic illness Atrial Fibrillation, status post ablation, followed by EP Dyslipidemia, on therapy Hypertension, chronic illness , on therapy Overall she appears to be stable.  She has no new complaints from a cardiac standpoint.  She has about the Watchman device.  I told her that if she is tolerating her anticoagulation well there is no strong indication to change that plan but she would like to talk to someone about it.  I told she is making plans to see Dr. Rojelio Dr. Toribio about this procedure.  Continue current therapy for now Hospital records reviewed Recent cardiac tests reviewed and summarized Recent labs reviewed and discussed External tests reviewed Prescription drug management: Cardiac meds reviewed; Continue Current medical therapy Continued/increased exercise recommended weight loss suggested Call if symptoms worsen   Orders  No orders of the defined types were placed in this encounter.  Return in about 1 year (around 01/08/2024).  HPI   Lynn Dunn   returns for follow up of CAD.  She  Has been seen in our office previously by Hildegard Rinks, M.D. and [facility], [place] last seen 12/28/2021. She has a history of CAD, pAF s/p ablation, sick sinus syndrome s/p PPM, HFpEF, HTN, COPD, tracheomalacia, hx of CVA, OSA on CPAP, pre-diabetes, CKD III. Her  prior symptoms of shortness of breath are unchanged.  She  has No new complaints    She  has Rare complaints of  palpitations Her  blood pressure has been controlled Her Cholesterol has been checked within the  year Her sugar has been controlled She does not exercise regularly. She does not use tobacco.  Physical Examination:  VITAL SIGNS:  Vitals:   01/08/23 1318  BP: (!) 160/105  Pulse: 79  Weight: 78 kg (172 lb)  Height: 1.524 m (5')   General: Comfortable in NAD Skin:  Warm & dry Resp:  Resp even and nonlabored.  Lungs sounds clear throughout CV:  S1S2 RRR with extrasystoles, no murmur noted, No gallops or rubs Abd:  Soft, nontender, NABS Ext:  No cyanosis or edema Neck:  No JVD. no carotid bruit(s) Pulses:  2+ and symmetrical upper and lower extremities.   Past Medical History,  Past Surgical History, Family History, Social History, Medications, Allergies, Social History: As reviewed in EPIC  Review of Systems: All positive and pertinent negatives are noted in the HPI; otherwise all other systems are negative  Objective Data Reviewed During this Patient Encounter:   Last ECG showed SR with PVCs  Echo cardiogram 08/15/2022: The left ventricular size is normal with normal left ventricular wall thickness. LV ejection fraction = 55-60%. Left ventricular filling pattern is prolonged relaxation. The left atrium is moderately dilated. Right atrial size is normal. Injection of agitated saline showed mild to moderate right-to-left interatrial shunt. There is mild aortic stenosis. No pulmonary hypertension. IVC size was normal. There is no pericardial effusion. Compared to the prior study, there is probably no significant change.   Lab Results  Component Value Date   CHOL 141 07/26/2022   TRIG 113 07/26/2022  HDL 43 (L) 07/26/2022   LDLCALC 78 07/26/2022   LDLDIRECT 99 07/13/2021   Lab Results  Component Value Date   WBC 7.00 11/01/2022   HGB 10.5 (L) 11/01/2022   HCT 31.1 (L) 11/01/2022   PLT 284 11/01/2022   Lab Results  Component Value Date   NA 138 11/01/2022   K 4.0 11/01/2022   CL 98 11/01/2022   CO2 31 11/01/2022   BUN 28 (H) 11/01/2022   CREATININE 1.48  (H) 11/01/2022   AST 13 11/01/2022   ALT 8 11/01/2022   Current Outpatient Medications:    calcitrioL (ROCALTROL) 0.25 mcg capsule, TAKE 1 CAPSULE EVERY DAY, Disp: 90 capsule, Rfl: 1   cholecalciferol  (VITAMIN D3) 1,000 unit (25 mcg) tablet, Take 1,000 Units by mouth daily., Disp: , Rfl:    coenzyme Q-10 100 mg capsule, Take 1 capsule by mouth Once Daily., Disp: , Rfl:    cyanocobalamin , vitamin B-12, 1,000 mcg lozg, Place 1,000 mcg under the tongue Once Daily., Disp: 50 lozenge, Rfl: 0   dilTIAZem  (CARDIZEM  CD) 120 mg 24 hr capsule, Take 120 mg by mouth nightly., Disp: 90 capsule, Rfl: 3   empagliflozin  (Jardiance ) 10 mg tab, Take 10 mg by mouth Once Daily., Disp: 90 tablet, Rfl: 3   estradioL  (ESTRACE ) 0.01 % (0.1 mg/gram) vaginal cream, USING AS NEEDED, Disp: , Rfl:    levothyroxine  (SYNTHROID ) 50 mcg tablet, TAKE 1 TABLET DAILY AT 6:00AM, Disp: 90 tablet, Rfl: 3   loratadine (CLARITIN) 10 mg tablet, Take 10 mg by mouth Once Daily for 14 days., Disp: 14 tablet, Rfl: 0   magnesium  sulfate 100 mg cap, Take 1 capsule by mouth daily. MAGNESIUM  SPAY: SPAYING ON THE KNEES AS NEEDED, Disp: , Rfl:    meclizine (ANTIVERT) 25 mg tablet, Take 25 mg by mouth 3 (three) times a day as needed for dizziness., Disp: 30 tablet, Rfl: 0   medical supply, miscellaneous (miscellaneous medical supply) misc, USES 2 LITERS AS NEEDED DURING THE DAY, Disp: , Rfl:    metoprolol  tartrate (LOPRESSOR ) 25 mg tablet, Take 25 mg by mouth 2 (two) times a day., Disp: 720 tablet, Rfl: 0   NON FORMULARY, , Disp: , Rfl:    ondansetron  (ZOFRAN -ODT) 4 mg disintegrating tablet, Take 4 mg by mouth every 8 (eight) hours as needed for nausea., Disp: 20 tablet, Rfl: 0   potassium chloride  (KLOR-CON ) 10 mEq ER tablet, Take 10 mEq by mouth Once Daily., Disp: 90 tablet, Rfl: 10   rosuvastatin  (CRESTOR ) 10 mg tablet, Take 10 mg by mouth Once Daily., Disp: 90 tablet, Rfl: 3   semaglutide (Ozempic) 0.25 mg or 0.5 mg(2 mg/1.5  mL) pnij, Inject 0.5 mg under the skin once a week., Disp: 3 mL, Rfl: 1   torsemide  (DEMADEX ) 20 mg tablet, Take 1 tablet (20 mg total) by mouth daily. (Patient taking differently: Take 20 mg by mouth 2 (two) times a day.), Disp: 90 tablet, Rfl: 3   traMADoL  (ULTRAM ) 50 mg tablet, Take 1 tablet (50 mg) once daily by mouth as needed for pain., Disp: 30 tablet, Rfl: 0   turmeric-turmeric root extract 450-50 mg cap, Take 500 mg by mouth Once Daily., Disp: , Rfl:    warfarin (COUMADIN ) 3 mg tablet, Take 3 mg by mouth Once Daily. Indications: deep vein thrombosis prevention, Disp: 90 tablet, Rfl: 3  Portions of this note were dictated using DRAGON voice recognition software. Please disregard any errors in transcription.  This record has been created using [employer]  recognition software. Errors have been sought and corrected, but may not always be located. Such creation errors do not reflect on the standard of medical care.

## 2023-04-17 NOTE — Progress Notes (Signed)
 [facility] cardiology EP office Visit  Note  PCP: Jeoffrey Stephane Sacks, NP   Portion of this note were dictated using DRAGON voices recognition software. Please disregard any errors in transcription .    Reason for Visit:      Lynn Dunn is here for follow-up management of her dual-chamber Medtronic pacemaker that has been implanted 02/17/2015.  And also she has issue with orthostatic hypotension.  She is using wheelchair.  She is complaining weakness and feeling of passing out     Assessment/Plan  1.  Status post dual-chamber Medtronic pacemaker implant on 02/17/2015: 4 sick sinus syndrome sinus bradycardia was implanted [place],.  Interrogation of device show all value with normal range sensing threshold and impedance and battery status as 1.5 years left of ulcer and a change she has had 15 episodes of atrial tachycardia atrial fibrillation total is 39.9% burden.  Advise follow-up with device clinic.   2.  Paroxysmal atrial fibrillation: She is on diltiazem  CD 120 mg daily, metoprolol  25 mg twice daily for anticoagulation she is on Coumadin  managed by Coumadin  clinic.    3.  Orthostatic hypotension: We did orthostatic blood pressure.  That her blood pressure dropped drastically.  And she has been taking torsemide  20 mg twice daily I advised to take 1 a day and also weigh herself if she is not volume overloaded then she can get to time and then go back into 20 that hopefully that will help.  And also she should get up slowly from sitting supine position.   4.  Obstructive sleep apnea on CPAP managed by her primary.   5.  Lower extremity edema: Secondary to HFpEF.  Presently she is taking torsemide  20 mg twice daily advised to take 1 and then whenever she has more fluid then she can take 2 times a day.    6.  History of CVA without sequela etiology has been emboli.       PLAN: Advise decrease her torsemide  to 20 mg from 20 mg twice daily.   And weight herself daily if her weight goes up then she can take it 2 times a day then again go back to 20 mg daily.  And also when she get up ambulate and go bathroom at night she should not suddenly get up and go to bathroom or go for the door.  At that great she can pass out.  Continue follow-up with device clinic.  Her pulse generator need changed out in a year and 4 months.  We will last see her in a year year and 2 months.  As needed's for pulse and to change at that time.  Also advised follow-up with renal doctor and also her primary physician   Orders  No orders of the defined types were placed in this encounter. . Return for APP 6 months, [place] 1 year.  Portions of this note may have been created utilizing [employer] dictation software and may contain transcription errors that were missed during proofreading.  HPI   Lynn Dunn   returns for follow up with past medical history of multiple different comorbidity including history of paroxysmal atrial fibrillation with tacky bradycardia she has undergone pacemaker implant of Medtronic in past.  She has also atrial for flutter that she takes Coumadin  for rate and also rate control medication.  Including diltiazem  and metoprolol .  She has severe orthostatic hypotension.  Possible that torsemide  aggravated.  Advised decrease torsemide  to 20  mg daily to see how she does if she needs more then she goes back to .20 mg twice daily.  She has other comorbidity including half HFpEF.  With normal left ventricular function she is on torsemide  and lower extremity edema the consequence of her HFpEF.  And chronic shortness of breath and also she has COPD with last 2D echocardiogram show EF of 55-60%.  History of CVA in past without sequela.  Etiology was emboli.    Physical Examination:  VITAL SIGNS:  Vitals:   04/17/23 1014  SpO2: 98%   @WEIGHTVITALS @  General:  Resting comfortably in NAD Neuro:  Alert & oriented X 3.   Resp:  Resp even and  nonlabored.  Lungs sounds clear throughout CV:  Regular rate and rhythm,no murmur , No gallops or rubs GI:  Soft, nontender, non-distended, NABS Ext:  No edema Neck:  No JVD Pulses:  2+ and symmetrical upper and lower extremities  Hildegard Rinks, MD, MD, [facility] 04/17/2023, 11:08 AM   Past Medical History,  Past Surgical History, Family History, Social History, Medications, Allergies, Social History: As reviewed in EPIC  Review of Systems: All positive and pertinent negatives are noted in the HPI; otherwise all other systems are negative  Objective Data Reviewed During this Patient Encounter:   EKG:  Lab Results  Component Value Date   CHOL 136 02/07/2023   TRIG 115 02/07/2023   HDL 44 (L) 02/07/2023   Lab Results  Component Value Date   WBC 7.80 02/07/2023   HGB 11.4 (L) 02/07/2023   HCT 35.4 (L) 02/07/2023   PLT 239 02/07/2023   Lab Results  Component Value Date   NA 139 02/07/2023   K 3.8 02/07/2023   CL 96 (L) 02/07/2023   CO2 32 (H) 02/07/2023   BUN 47 (H) 02/07/2023   CREATININE 1.72 (H) 02/07/2023   AST 14 02/07/2023   ALT 9 02/07/2023      Current Outpatient Medications:    calcitrioL (ROCALTROL) 0.25 mcg capsule, TAKE 1 CAPSULE EVERY DAY, Disp: 90 capsule, Rfl: 1   cholecalciferol  (VITAMIN D3) 1,000 unit (25 mcg) tablet, Take 1,000 Units by mouth daily., Disp: , Rfl:    coenzyme Q-10 100 mg capsule, Take 1 capsule by mouth daily., Disp: , Rfl:    cyanocobalamin , vitamin B-12, 1,000 mcg lozg, Place 1,000 mcg under the tongue Once Daily., Disp: 50 lozenge, Rfl: 0   dilTIAZem  (CARDIZEM  CD) 120 mg 24 hr capsule, Take 120 mg by mouth nightly., Disp: 90 capsule, Rfl: 3   empagliflozin  (Jardiance ) 10 mg tab, Take 10 mg by mouth Once Daily., Disp: 90 tablet, Rfl: 3   estradioL  (ESTRACE ) 0.01 % (0.1 mg/gram) vaginal cream, USING AS NEEDED, Disp: , Rfl:    levothyroxine  (SYNTHROID ) 50 mcg tablet, TAKE 1 TABLET DAILY AT 6:00AM, Disp: 90 tablet, Rfl: 3   loratadine  (CLARITIN) 10 mg tablet, Take 10 mg by mouth Once Daily for 14 days., Disp: 14 tablet, Rfl: 0   magnesium  sulfate 100 mg cap, Take 1 capsule by mouth daily. MAGNESIUM  SPAY: SPAYING ON THE KNEES AS NEEDED, Disp: , Rfl:    meclizine (ANTIVERT) 25 mg tablet, Take 25 mg by mouth 3 (three) times a day as needed for dizziness., Disp: 30 tablet, Rfl: 0   medical supply, miscellaneous (miscellaneous medical supply) misc, USES 2 LITERS AS NEEDED DURING THE DAY, Disp: , Rfl:    metoprolol  tartrate (LOPRESSOR ) 25 mg tablet, Take 25 mg by mouth 2 (two) times a day., Disp:  720 tablet, Rfl: 0   NON FORMULARY, , Disp: , Rfl:    ondansetron  (ZOFRAN -ODT) 4 mg disintegrating tablet, Take 4 mg by mouth every 8 (eight) hours as needed for nausea., Disp: 20 tablet, Rfl: 0   potassium chloride  (KLOR-CON ) 10 mEq ER tablet, Take 10 mEq by mouth Once Daily., Disp: 90 tablet, Rfl: 10   rosuvastatin  (CRESTOR ) 10 mg tablet, Take 10 mg by mouth Once Daily., Disp: 90 tablet, Rfl: 3   semaglutide (Ozempic) 0.25 mg or 0.5 mg(2 mg/1.5 mL) pnij, Inject 0.5 mg under the skin once a week., Disp: 3 mL, Rfl: 1   torsemide  (DEMADEX ) 20 mg tablet, TAKE 1 TABLET EVERY DAY, Disp: 90 tablet, Rfl: 3   traMADoL  (ULTRAM ) 50 mg tablet, Take 1 tablet (50 mg) once daily by mouth as needed for pain., Disp: 30 tablet, Rfl: 0   turmeric-turmeric root extract 450-50 mg cap, Take 500 mg by mouth Once Daily., Disp: , Rfl:    warfarin (COUMADIN ) 3 mg tablet, Take 3 mg by mouth Once Daily. Indications: deep vein thrombosis prevention, Disp: 90 tablet, Rfl: 3

## 2023-12-12 NOTE — Progress Notes (Signed)
 Primary Care Provider:Lori LELON Dimes, PA-C  Referring provider:  same as above   Reason for consultation:  Chief Complaint  Patient presents with   Establish Care    History of present illness: 89 y.o.  female  referred to the arrhythmia clinic for evaluation and treatment of chronic atrial fibrillation status post pacemaker coronary artery disease and hypertension.  She is seeking to establish care with a new cardiology practice after experiencing problems with her previous doctor.  Patient states that she was on the table and about to have an upgrade of her pacemaker and AV node ablation when she overheard him say that she was too old and that they would not be proceeding.  According to the notes, the patient had complete occlusion of the left sided veins thus making it impossible to upgrade her device from that side.  There is also issue of biatrial enlargement and severe tricuspid regurgitation.  She has seen another structural heart doctor who was claimed that a trial tricuspid clip would not be helpful in this situation as they are and pursue impingement of her pacemaker lead on the tricuspid valve. She reports moderate dyspnea on exertion and shortness of breath and is very dissatisfied with her current state of being.     Medication Sig Dispense Refill   diltiazem  (CARDIZEM ) 120 MG tablet Take 120 mg by mouth 4 (four) times daily.     ezetimibe  (ZETIA ) 10 MG tablet Take 10 mg by mouth daily.     furosemide  (LASIX ) 40 mg tablet Take 40 mg by mouth 2 (two) times daily.     HYDROcodone -acetaminophen  (NORCO) 5-325 mg per tablet Take 1 tablet by mouth every 4 (four) hours as needed for Pain. (Patient not taking: Reported on 12/12/2023) 20 tablet 0   metoprolol  tartrate (LOPRESSOR ) 25 mg tablet Take 25 mg by mouth 2 (two) times daily.     potassium chloride  (POTASSIUM CHLORIDE ) 10 mEq CR tablet Take 10 mEq by mouth 2 (two) times daily.     rosuvastatin  (CRESTOR ) 20 MG tablet Take 20  mg by mouth daily.     Warfarin Sodium  (COUMADIN  PO) Take 3 mg by mouth.     No current facility-administered medications for this visit.    Allergies[1]  Past Medical History:  Diagnosis Date   CVA (cerebral vascular accident) (*) 2009   Hyperlipemia    Hypertension     Past Surgical History:  Procedure Laterality Date   Knee arthroscopy Bilateral 2015    History reviewed. No pertinent family history.  Social History[2]   Review of systems: All systems were reviewed and as stated in history of present illness as well as what was reported in the patient profile sheet shown below:  Physical examination: Vitals:   12/12/23 1408  BP: 130/88  Pulse: 63  Resp: 16     General: Well-developed, well-nourished   female , looking stated age, in no acute distress HEENT: Normocephalic, atraumatic. Sclera nonicteric. Pink conjunctiva. Moist mucous membranes. Neck: No carotid bruits.  Prominent A waves noted moderate JVD. Midline trachea,supple Back: No CVAT. moderate kyphosis and scoliosis. No point tenderness. Chest: Resonant to percussion clear to auscultation. No wheezes, rales, or rhonchi. Cardiac exam: Point of maximal impulse is  nonpalpable.irregular rate and rhythm Normal S1, S2, no rubs or gallops. 1/6 systolic murmur Abdomen: Normal active bowel sounds, soft, nontender, no hepatosplenomegaly. No guarding or rebound. Extremities: limited range of motion of all extremities. No clubbing, cyanosis, or edema. Pulses: The carotid, radial, femoral,  dorsalis pedis and posterior tibial pulses were all 2+ bilaterally. Neuro: Alert and oriented x3 nonfocal. Cranial nerves, motor, sensory, and gait exam Skin: No lesions or rashes Pysch: Normal mood and affect      ECG 12 lead Result Date: 12/12/2023 Atrial fibrillation -With run of ventricular paced beats and pvcs Diffuse low voltage. -Poor R-wave progression -may be secondary to pulmonary disease  consider old anterior  infarct. -Nonspecific T-abnormality.    Impression: 1. Atrial fibrillation, unspecified type (*)   2. Chest pain, unspecified type   3. Coronary artery disease, unspecified vessel or lesion type, unspecified whether angina present, unspecified whether native or transplanted heart   4. Hypertension, unspecified type   5. Palpitations     Recommendations/plan: We discussed the fact that her shortness of breath is probably multifactorial.  I would like to see if one of my structural heart partners can review her echocardiogram and determine whether or not she is a candidate for a tricuspid valve clip.  I pointed out to the patient the complexity of her issues but I be willing to try to attempt to restore irregular heartbeat first by performing just an AV node ablation with her current pacemaker if she continues to have symptoms or if her EF declines we then can consider putting in either a BiV or physiologic pacemaker and on the right side.  After all, she will likely need a generator replacement due to impending elective replacement indicator.  We will also get the patient set up in our device clinic so that we can follow the device more closely.  Patient is agreeable to trying an AV node ablation knowing that she would become pacemaker dependent.   This note was dictated with voice recognition software. Similar sounding words can inadvertently get transcribed and not corrected upon review.         [1] Allergies Allergen Reactions   Albuterol  Palpitations  [2] Social History Socioeconomic History   Marital status: Widowed  Tobacco Use   Smoking status: Never   Smokeless tobacco: Never  Vaping Use   Vaping status: Never Used  Substance and Sexual Activity   Alcohol use: No   Drug use: No

## 2023-12-13 NOTE — H&P (View-Only) (Signed)
 PREP FOR CASE Pre-Op H&P  This is a patient seen by another provider for shortness of breath, chronic atrial fibrillation, and valvular heart disease. The patient has since been scheduled for AV node ablation on 01/04/24 with Dr. Ozell Codding. This note has been attached to the patient's chart in preparation for upcoming procedure. The physician will update H&P per our established workflow.  I have not met nor personally completed a physical exam for this patient. I have not developed a treatment plan nor made recommendations for this patient's procedure. As such, I have not personally consented the patient for the recommended procedure.  Signed: Lorae M. Kennyth RIGGERS [facility] 12/13/2023 / 10:29 AM   Ozell LOISE Codding, MD  Physician Specialty:  Cardiology Progress Notes     Signed Encounter Date:  12/12/2023   Signed      Expand All Collapse All Primary Care Provider:Lori LELON Dimes, PA-C   Referring provider:  same as above     Reason for consultation:     Chief Complaint  Patient presents with   Establish Care      History of present illness: 89 y.o.  female  referred to the arrhythmia clinic for evaluation and treatment of chronic atrial fibrillation status post pacemaker coronary artery disease and hypertension.  She is seeking to establish care with a new cardiology practice after experiencing problems with her previous doctor.  Patient states that she was on the table and about to have an upgrade of her pacemaker and AV node ablation when she overheard him say that she was too old and that they would not be proceeding.  According to the notes, the patient had complete occlusion of the left sided veins thus making it impossible to upgrade her device from that side.  There is also issue of biatrial enlargement and severe tricuspid regurgitation.  She has seen another structural heart doctor who was claimed that a trial tricuspid clip would not be helpful in  this situation as they are and pursue impingement of her pacemaker lead on the tricuspid valve. She reports moderate dyspnea on exertion and shortness of breath and is very dissatisfied with her current state of being.   Current Rx           Medication Sig Dispense Refill   diltiazem  (CARDIZEM ) 120 MG tablet Take 120 mg by mouth 4 (four) times daily.       ezetimibe  (ZETIA ) 10 MG tablet Take 10 mg by mouth daily.       furosemide  (LASIX ) 40 mg tablet Take 40 mg by mouth 2 (two) times daily.       HYDROcodone -acetaminophen  (NORCO) 5-325 mg per tablet Take 1 tablet by mouth every 4 (four) hours as needed for Pain. (Patient not taking: Reported on 12/12/2023) 20 tablet 0   metoprolol  tartrate (LOPRESSOR ) 25 mg tablet Take 25 mg by mouth 2 (two) times daily.       potassium chloride  (POTASSIUM CHLORIDE ) 10 mEq CR tablet Take 10 mEq by mouth 2 (two) times daily.       rosuvastatin  (CRESTOR ) 20 MG tablet Take 20 mg by mouth daily.       Warfarin Sodium  (COUMADIN  PO) Take 3 mg by mouth.        No current facility-administered medications for this visit.        [Allergies]  [Allergies]     Allergen Reactions   Albuterol  Palpitations     Past Medical History      Past  Medical History:  Diagnosis Date   CVA (cerebral vascular accident) (*) 2009   Hyperlipemia     Hypertension          Past Surgical History       Past Surgical History:  Procedure Laterality Date   Knee arthroscopy Bilateral 2015        Family History  History reviewed. No pertinent family history.     [Social History]   [Social History]     Socioeconomic History   Marital status: Widowed  Tobacco Use   Smoking status: Never   Smokeless tobacco: Never  Vaping Use   Vaping status: Never Used  Substance and Sexual Activity   Alcohol use: No   Drug use: No     Review of systems: All systems were reviewed and as stated in history of present illness as well as what was reported in  the patient profile sheet shown below:  Physical examination:    Vitals:    12/12/23 1408  BP: 130/88  Pulse: 63  Resp: 16      General: Well-developed, well-nourished   female , looking stated age, in no acute distress HEENT: Normocephalic, atraumatic. Sclera nonicteric. Pink conjunctiva. Moist mucous membranes. Neck: No carotid bruits.  Prominent A waves noted moderate JVD. Midline trachea,supple Back: No CVAT. moderate kyphosis and scoliosis. No point tenderness. Chest: Resonant to percussion clear to auscultation. No wheezes, rales, or rhonchi. Cardiac exam: Point of maximal impulse is  nonpalpable.irregular rate and rhythm Normal S1, S2, no rubs or gallops. 1/6 systolic murmur Abdomen: Normal active bowel sounds, soft, nontender, no hepatosplenomegaly. No guarding or rebound. Extremities: limited range of motion of all extremities. No clubbing, cyanosis, or edema. Pulses: The carotid, radial, femoral, dorsalis pedis and posterior tibial pulses were all 2+ bilaterally. Neuro: Alert and oriented x3 nonfocal. Cranial nerves, motor, sensory, and gait exam Skin: No lesions or rashes Pysch: Normal mood and affect          Recent ECG Results (past 30 days)  ECG 12 lead Result Date: 12/12/2023 Atrial fibrillation -With run of ventricular paced beats and pvcs Diffuse low voltage. -Poor R-wave progression -may be secondary to pulmonary disease  consider old anterior infarct. -Nonspecific T-abnormality.       Impression: 1. Atrial fibrillation, unspecified type (*)   2. Chest pain, unspecified type   3. Coronary artery disease, unspecified vessel or lesion type, unspecified whether angina present, unspecified whether native or transplanted heart   4. Hypertension, unspecified type   5. Palpitations       Recommendations/plan: We discussed the fact that her shortness of breath is probably multifactorial.  I would like to see if one of my structural heart partners can review her  echocardiogram and determine whether or not she is a candidate for a tricuspid valve clip.  I pointed out to the patient the complexity of her issues but I be willing to try to attempt to restore irregular heartbeat first by performing just an AV node ablation with her current pacemaker if she continues to have symptoms or if her EF declines we then can consider putting in either a BiV or physiologic pacemaker and on the right side.  After all, she will likely need a generator replacement due to impending elective replacement indicator.  We will also get the patient set up in our device clinic so that we can follow the device more closely.   Patient is agreeable to trying an AV node ablation knowing that she would  become pacemaker dependent.     This note was dictated with voice recognition software. Similar sounding words can inadvertently get transcribed and not corrected upon review.            Electronically signed by Ozell LOISE Codding, MD at 12/12/23 1546 Office Visit on 12/12/2023  Office Visit on 12/12/2023 <redacted file path>   Detailed Report <redacted file path>  Note shared with patient Additional Documentation  Vitals:  BP 130/88 Pulse 63 Resp 16 Ht 5' (1.524 m) Wt 163 lb 3.2 oz (74 kg) BMI 31.87 kg/m BSA 1.77 m Pain [place] 0-No pain

## 2024-01-04 NOTE — Interval H&P Note (Signed)
 H&P reviewed, patient examined and there is NO CHANGE to the patient status.

## 2024-03-14 NOTE — Progress Notes (Signed)
" °  Pharmacy Consult - Warfarin Dosing Indication: Atrial fibrillation Relevant PMH/HPI/Risk Factors for increased warfarin sensitivity: heart failure, advanced age (e.g. heart failure, hepatic failure, shock, low body weight, malnutrition, diarrhea, advanced age, hyperthyroidism, ESRD, alcohol abuse) Goal INR: 2 - 3 Home Dose: coumadin  3mg  daily (missed doses? 03/12/24)  Relevant Labs: SCr 1.5  Date INR Hgb Plt Dose  01/07 1.5 11.5 252 None  01/08 1.5 11.5 213 5mg   01/09 1.5 11.5 224 3mg  ordered   Drug-drug Interactions: rosuvastatin  Diet (NPO/enteral feeds/PO percentage intake): cardiac diet with last recorded intake 100% Additional anti-thrombotics/bridge therapy: None Bleeding Assessment: No apparent bleeding issues at this time.   Reversal agents given? N/A  Assessment/Plan: Coumadin  3mg  po x1 dose today.  INR in AM 03/15/24.  Further orders to follow based on those results. *Initial or subsequent loading doses are not recommended. May take 3-5 days to see effect of dose change on INR.  *For dose recommendation at discharge, please contact pharmacy.  "

## 2024-03-14 NOTE — Progress Notes (Signed)
 [facility]  [facility] [phone] Attending Physician:  Mohsin DELENA Haley, MD Length of Stay:  3  Chief Complaint  Patient presents with   Shortness of Breath    Patient reports [facility] for several months. Patient also reports chest pain and fatigue.   , Palliative Encounter                                                              Progress Note      Assessment  Llesenia Fogal is a 89 y.o. female with HTN, Hyperlipidemia, CVA, cardiomyopathy (EF 50-55% 02/14/24), valvular heart disease, SSS, dual pacemaker present, afib on anticoagulant who presented to the hospital with SOB, chest pain and fatigue.  Hospital course notable for acute respiratory failure with hypoxia due to right pleural effusion, pulmonary HTN.  Right pleural effusion - thoracentesis US  guided today. Palliative care is involved to assist with GOC discussions/complex medical decision making      -   Today's discussion and interval history-I reviewed medical record and visited patient at bedside.  No family present at time of my visit.  Patient shared with me that she is feeling better since the thoracentesis.  She normally wears 2 L of oxygen  and that is what she is wearing now.  She also states that this morning she was able to get out of bed easier than she has been at home.  She does live in independent living and has had some progressive decline in the recent weeks to months.  She said she does follow-up with her heart doctor outside of the hospital.  She has not been told anything is changed with her heart recently.  She has never had issues with her lungs in particular.  This is the first time she has had collection of fluid.  I did discuss with her it is possible this fluid could return, we will have to see how she does.  I spoke with her about overall goals, she had questions about palliative versus hospice.  I did my best to answer her questions and gave her a  card with the definition of palliative care services.  I specifically asked her about her wishes around resuscitation and intubation.  She shared that it is a difficult thing for her because her husband was resuscitated and lived 8 years after the resuscitation.  We discussed that this is a very personal decision.  That it should be made in consideration with underlying medical condition as well as overall quality of life and if within reason we think the intervention will ultimately help her live longer with a quality of life that is acceptable to her.  We discussed considerations around changing heart health, and I encouraged her to discuss with her cardiac team in more detail as well.  She seems receptive to discussions.  She agrees to review MOST form.  I brought MOST form back to the room and I reviewed section in A, section B, C and D in detail.  She said she needs some time to think about her wishes around resuscitation, CPR and intubation.  She seems receptive to IV fluids and antibiotics.  She tells me after we discussed the role of feeding tubes both long-term and more short-term for  a trial period, she said she does not believe she would want long-term feeding tubes but would be okay with a defined trial period of a feeding tube.  While she does not feel ready to complete the MOST form today she needs some time to think about this.  I let her know our palliative care team is not here over the weekend.  I offered referral to an outpatient palliative team that might meet with her in the coming months after discharge to continue discussions and she agrees.  She did share that she has some chronic neuropathy that affects her quality of life.  She has never taken any treatment for this.  She said it is been chronic.  I briefly discussed the role of gabapentin or pregabalin to help with symptom relief.  She said she has heard bad things about gabapentin and is not sure if she want to try.  I encouraged her  to continue discussions about her neuropathy with her outpatient PCP, as this seems to be affecting her quality of life.  She can also work on these discussions with her outpatient palliative care team if neuropathy continues to be a concern for her.  Discussed with patient if she is still here on Monday we can follow-up with her, I let her know the palliative care team is not here over the weekends.  When discussing her healthcare agent, she said she is not sure who she would choose for that.  We discussed the hierarchy in [place]  which would be her children since she is widowed.  She shared her son is sick and this has been very hard for her.  She will think about this further.  I did let her know if she wanted to designate her sister as her age and I would recommend she complete healthcare power of attorney   Labs Cr 1.5, WBC 7.8, potassium 3.6 PRN meds over past 24 hours: 7 am to 7 am Acetaminophen  650 mg oral x 1 Miralax  17 gm x1  I updated attending Dr. Redia on above conversations      Summary-    -   Discharge plan- patient hopes to return to home, possibly transition to a higher level of care they are/ALF instead of independent living.  She is also receptive to a palliative care consult at discharge for ongoing supportive discussions.   -   Goals of Care- full code.  Introduced MOST form today.  She has expressed she would not want long-term feeding tubes, otherwise she has not expressed any limits to her care at this time         Recommendations and Plan   Palliative Care Encounter/GOC discussions - introduced MOST form, patient is not ready to complete -Patient agrees to outpatient palliative care consult to continue discussions - per [place] hierarchy, medical decision makers would be daughter and son equally, son currently ill and not abel to be present. Daughter coming to hospital tomorrow. Sister available by telephone. If pt wants sister to be HCPOA, we can assist in  getting this paper work done for her - Please call us  if we are needed immediately. - Palliative care will continue to follow for assistance with goals of care and support as appropriate    Dyspnea/pleural effusion/acute on chronic respiratory failure/pulmonary HTN - dyspnea worsening over past 3 months - pt is undergoing thoracentesis 03/13/24, 730 cc removed - baseline oxygen  use at 2 L - 2 L  03/14/24 - Symptoms have improved  status post thoracentesis        Relevant Hospital Problem List    Chief Complaint- Palliative care encounter   Active hospital problems contributing to the patient's medical complexity and may influence medical decision making:   Active Hospital Problems   *Acute respiratory failure with hypoxia (*)    Pleural effusion on right    Hypertension    Hyperlipidemia    History of stroke    Chronic anticoagulation    Warfarin anticoagulation    Pacemaker    Pulmonary hypertension (*)    (HFpEF) heart failure with preserved ejection fraction (*)    Afib (*)             Subjective   Please see today's discussion for details.        Physical Examination  Temp:  [97.3 F (36.3 C)-98.4 F (36.9 C)] 97.3 F (36.3 C) Heart Rate:  [46-74] 72 Resp:  [14-20] 20 BP: (116-159)/(51-78) 146/73 SpO2:  [60 %-99 %] 99 % 24 Hour Pain Score (last day)     Date/Time Pain Score   03/13/24 0928 Zero   03/13/24 2145 Three   03/14/24 0033 Three   03/14/24 0130 Two   03/14/24 0830 Zero      Percent Meals Eaten (%):  [100 %] 100 % (01/08 1402)  Last Bowel Movement: Last BM Date: 03/12/24  Wt Readings from Last 5 Encounters:  03/12/24 73.5 kg (162 lb)  02/13/24 76.7 kg (169 lb 1.6 oz)  12/12/23 74 kg (163 lb 3.2 oz)  03/06/20 95.3 kg (210 lb)  03/11/17 97.6 kg (215 lb 3.2 oz)    General:   NAD.  Cooperative.  Appears sleepy, wakes easily.  Engaging in discussions Cardio:  No rubs Pulm:  Nonlabored breathing Abdo:  Soft/ND Ext:   No  clubbing, nonpitting edema        Advance Care Wishes and Domains of Care   -Proxy Health Care Decision Maker (if needed): - per [place] hierarchy, medical decision makers would be daughter and son equally, son currently ill and not abel to be present.  -Advance Directives: none on file.  Discussed healthcare power of attorney and MOST form -Life limiting illness:  acute on chronic respiratory failure due to pleural effusion, cardiomyopathy  -Estimated Prognosis:  -Code Status:  FULL CODE   Domains of Care: -Values:   -Psycho/Social: 2 children, son Dick has cancer. Lives in [place]. Daughter lives in [place]. Sister lives close. Windowed. Lives at [facility] independent living -Spiritual:  -Cultural:  -Ethical/Legal:     Recent Course Prior to Hospitalization and Hospital Course      I spent 55 minutes total today including time spent reviewing the patient record, performing medically appropriate history/physical exam, reviewing/placing orders, speaking with patient/family/medical team, and documenting clinical information in the electronic medical record.       Electronically signed: Roderick LITTIE Brandy, PA-C 03/14/2024 / 10:43 AM

## 2024-03-14 NOTE — Nursing Note (Signed)
 Ambulated patient with 2 Liters which was baseline at home oxygen  saturation decreased to 93 percent.

## 2024-03-14 NOTE — Progress Notes (Signed)
 [facility] Case Management     Patient:   Lynn Dunn MR Number:  [id number] Patient Date of Birth: December 27, 1930 Age/Sex:  89 y.o./female     CM spoke with patient who is adamant to return back to [employer].  She denied CM assessment and reports she will go to rehab at [facility].  CM observed patient doing several laps around 3rd floor with no issue.  CM called [facility] liaison at [facility] and she reports patient can use her [place] stay days (14 days of rehab) without PT notes from hospital.  CM spoke to patient at bedside, she denied needing Outpatient palliative service and reports she can get services from [facility] if needed.  Pt is ready to DC, provider has agreed to her DC.  She denied need for HH/DME's at this time.    Electronically signed: Darice Goldmann, MSW 03/14/2024 / 3:38 PM

## 2024-03-14 NOTE — Discharge Summary (Signed)
 "  [facility]  Discharge Summary  PCP: Katheryn LELON Dimes, PA-C Discharge Details   Admit date:         03/12/2024 Discharge date and time:       03/14/2024 Hospital LOS:    3  days  Active Hospital Problems   Diagnosis Date Noted POA   *Acute respiratory failure with hypoxia (*) 03/12/2024 Yes   Pleural effusion on right 03/12/2024 Unknown   Hypertension 03/12/2024 Unknown   Hyperlipidemia 03/12/2024 Unknown   History of stroke 03/12/2024 Not Applicable   Chronic anticoagulation 03/12/2024 Not Applicable   Warfarin anticoagulation 03/12/2024 Not Applicable   Pacemaker 03/12/2024 Not Applicable   Pulmonary hypertension (*) 03/12/2024 Unknown   (HFpEF) heart failure with preserved ejection fraction (*) 03/12/2024 Unknown   Afib (*) 12/12/2023 Yes    Resolved Hospital Problems  No resolved problems to display.      Current Discharge Medication List     CONTINUE these medications which have NOT CHANGED      Details  calcitRIOL 0.25 mcg capsule Commonly known as: ROCALTROL  Take one capsule (0.25 mcg dose) by mouth daily.   cholecalciferol  1,000 units (25 mcg) tablet Commonly known as: VITAMIN D -3  Take one tablet (1,000 Units dose) by mouth daily.   COUMADIN  PO  Take 3 mg by mouth daily.   furosemide  40 mg tablet Commonly known as: LASIX   Take one tablet (40 mg dose) by mouth 2 (two) times daily.   HYDROcodone -homatropine 5-1.5 mg/5 mL solution Commonly known as: HYCODAN,HYDROMET  Take 5 mLs by mouth 2 (two) times a day as needed. Max Daily Amount: 10 mLs Quantity: 120 mL   JARDIANCE  10 mg Tabs tablet Generic drug: empagliflozin   Take one tablet (10 mg dose) by mouth daily.   Magnesium  100 MG Caps  Take 1 capsule by mouth daily. Sulfate   meclizine HCl 25 mg tablet Commonly known as: ANTIVERT  Take one tablet (25 mg dose) by mouth every 12 (twelve) hours as needed for Dizziness.    mupirocin 2 % ointment Commonly known as: BACTROBAN  one Application by Nasal route 2 (two) times daily.   ondansetron  4 mg disintegrating tablet Commonly known as: ZOFRAN -ODT  Take one tablet (4 mg dose) by mouth every 8 (eight) hours as needed for Nausea.   OZEMPIC (0.25 OR 0.5 MG/DOSE) 2 MG/3ML Sopn pen injection Generic drug: semaglutide(0.25 or 0.5 mg/dose)  Inject 0.25 mg into the skin once a week. Sunday   potassium chloride  10 mEq CR tablet Commonly known as: KLOR-CON   Take one tablet (10 mEq dose) by mouth daily.   rosuvastatin  calcium  10 mg tablet Commonly known as: CRESTOR   Take one tablet (10 mg dose) by mouth daily.   traMADol  50 mg tablet Commonly known as: ULTRAM   Take one tablet (50 mg dose) by mouth 3 (three) times a day as needed for up to 3 days. Max Daily Amount: 150 mg Quantity: 6 tablet   TURMERIC PO  Take 500 mg by mouth 2 (two) times daily.      * You might also be taking other medications not listed above. If you have questions about any of your other medications, talk to the person who prescribed them or your Primary Care Provider.          STOP taking these medications    amoxicillin  500 mg capsule Commonly known as: AMOXIL          Hospital Course  Indication for Admission/chief complaint: Shortness of breath  Hospital Course:        89 year old female, history of HFpEF, A-fib with history of AV nodal ablation, presence of pacemaker, chronic anticoagulation with warfarin, CKD, CVA, obesity, presented to the hospital for symptoms of ongoing shortness of breath with associated fatigue.  Patient apparently has had similar symptoms for the past several months.  Her chest x-ray however showed moderate right pleural effusion with some atelectasis.  proBNP was 7767.  #1.  Dyspnea/right pleural effusion, likely related to underlying CHF and valvular heart disease/acute on chronic hypoxemic respiratory failure/underlying history of pulmonary  hypertension.  CT scan of the chest, abdomen and pelvis was obtained, showed large right pleural effusion with compressive atelectasis of the right lower lobe. No definitive infiltrate. Linear atelectasis versus scarring of the right hemithorax and left lingula. Cardiomegaly, coronary artery calcification. Dilatation of pulmonary artery. Right adnexal cyst.   Patient was admitted and continued on her oral Lasix .  Her Coumadin  was temporarily held.  She underwent thoracentesis by interventional radiology and feels much better afterwards.  Fluid analysis so far suggest possibly transudative effusion even though fluid protein is pending at this time.  She has been able to ambulate with the help of nursing staff and has maintained her oxygen  saturations with 2 L of oxygen  that she uses at her facility.  At this time we will continue patient on her home dosage of Lasix , she states that she has an appointment to see her cardiologist within a few days.  Encouraged to keep that appointment.  #2.  HFpEF/severe tricuspid valvular disease/pulmonary hypertension/moderate MVR/presence of pacemaker.  Her recent echocardiogram on 02/14/2024 had shown an EF of 50 to 55%, normal wall motion, mild aortic stenosis, moderate to severe tricuspid regurg, RVSP moderately elevated.  There was mild to moderate mitral regurg as well.  Since patient is feeling a lot better after thoracentesis and does not seem to exhibit any further signs of volume overload, at this time we will continue her on her home dosage of Lasix .  Continue with Jardiance .  #3.  A-fib with chronic warfarin anticoagulation.  Warfarin was temporarily held for thoracentesis, has been resumed afterwards.  #4.  CKD 3B.  Renal function appears to be close to baseline.  #5.  History of CVA.  Continue with statin.  #6.  Age-related physical debility.  Continue with therapy at the facility.  Recommendations to physicians/followup needed: Pcp,  cardiology    Physical Exam: Vitals:   03/14/24 0859  BP: 146/73  Pulse: 72  Resp: 20  Temp: 97.3 F (36.3 C)  SpO2: 99%     Labs on Discharge:  Recent Labs    Units 03/14/24 0115 03/13/24 1846 03/13/24 0211 03/12/24 1535  WBC thou/mcL 7.8 6.8 8.0 8.3  HGB gm/dL 88.4 88.1 88.4 88.4  HCT % 36.6 37.6 37.7 37.4  PLT thou/mcL 224 213 227 252   Recent Labs    Units 03/14/24 0115 03/13/24 0211 03/12/24 1535  NA mmol/L 142 143 139  K mmol/L 3.6* 3.7 3.4*  CL mmol/L 102 102 99  CO2 mmol/L 32 29 26  BUN mg/dL 34 42* 43*  CREATININE mg/dL 8.49* 8.19* 8.35*  CALCIUM  mg/dL 8.8 8.8 8.9   Recent Labs    Units 03/14/24 0833 03/12/24 1535  BILITOT mg/dL  --  0.8  AST U/L  --  17  ALT U/L  --  10  ALKPHOS U/L  --  100  ALBUMIN gm/dL  --  3.9  LDH U/L 300*  --    No results for input(s): TSH, HGBA1C in the last 168 hours. Recent Labs    Units 03/14/24 0115 03/13/24 0211 03/12/24 1535  INR  1.5 1.5 1.5   No results for input(s): CHOL, LDL, HDL, TRIG in the last 168 hours. No results for input(s): TROPONIN, CK in the last 168 hours.  Invalid input(s): CK-MB     Post Hospital Care   Discharge Procedure Orders  Follow-up with Primary Care Physician  Standing Status: Future  Referral Priority: Routine Referral Type: Consultation  Referral Reason: Evaluate and Return  Number of Visits Requested: 1 Expiration Date: 09/09/24   Ambulatory referral to Cardiology  Standing Status: Future  Referral Priority: Routine Referral Type: Consultation  Referral Reason: Evaluate and Return  Requested Specialty: Cardiology  Number of Visits Requested: 1 Expiration Date: 09/09/24   Cardiac diet (heart healthy)   Notify Physician for trouble breathing or symptoms that are worse   Notify Physician for swelling in your ankles, feet, hands, neck or face (Heart Failure)   Notify Physician for palpitations (Fluttering in your chest)   Notify Physician  -pulse  Order Comments: less than 50 or greater than 100 at rest   Activity as tolerated    Diet: Cardiac diet Cardiac diet (heart healthy)  Potential for Rehab:        Fair Code Status:   Full Code Disposition: Skilled nursing facility Consults:   Followup appointments:  Future Appointments  Date Time Provider Department Center  03/17/2024 12:40 PM Lamar KATHEE Muir, MD [facility] CAR None  03/17/2024  1:00 PM [facility] DEVICE CHECK [facility] CAR None  08/25/2024 11:00 AM Ozell LOISE Codding, MD [facility] None     Time spent in discharge process:  total time spent 45 minutes   Electronically signed: Mohsin DELENA Haley, MD 03/14/2024 / 3:40 PM   *Some images could not be shown."

## 2024-03-17 NOTE — Progress Notes (Signed)
 Consult Requesting Physician:Lori LELON Dimes, PA-C  No chief complaint on file.   HPI: Patient presents to the clinic today for consideration of advanced therapies for valvular heart disease.  She is a 89 year old white female with a history of atrial fibrillation status post ablation and permanent pacemaker as well as mild aortic stenosis and severe tricuspid regurgitation.  She is maintained on chronic anticoagulation in the form of warfarin.  She was recently hospitalized with a heart failure exacerbation and pleural effusion.  She had a thoracentesis and was diuresed and improved.  She had an echocardiogram in December that I personally reviewed which showed mild aortic stenosis.  Pulmonary artery pressures were 59.  She has at least 3-4+ tricuspid regurgitation-query pacemaker induced-.  In the past, she was followed by the [facility] cardiology system.  She is now here for another opinion regarding the possibility of tricuspid clip.  She does describe generalized fatigue, shortness of breath, dyspnea on exertion.  She lives at a retirement community although still ambulates and has her mental faculties about her.  Her weight has been stable since discharge from the hospital on an SGLT2 inhibitor.  Current weight is 163 pounds.  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  She states her legs are normally fat.  Closest relative is her sister who lives in [place].     Medication Sig Dispense Refill   calcitRIOL (ROCALTROL) 0.25 mcg capsule Take one capsule (0.25 mcg dose) by mouth daily.     cholecalciferol  1,000 units (25 mcg) tablet Take one tablet (1,000 Units dose) by mouth daily.     furosemide  (LASIX ) 40 mg tablet Take one tablet (40 mg dose) by mouth 2 (two) times daily.     HYDROcodone -homatropine (HYCODAN,HYDROMET) 5-1.5 mg/5 mL solution Take 5 mLs by mouth 2 (two) times a day as needed. Max Daily Amount: 10 mLs 120 mL 0   JARDIANCE  10 mg TABS tablet Take  one tablet (10 mg dose) by mouth daily.     Magnesium  100 MG CAPS Take 1 capsule by mouth daily. Sulfate     meclizine HCl (ANTIVERT) 25 mg tablet Take one tablet (25 mg dose) by mouth every 12 (twelve) hours as needed for Dizziness.     mupirocin (BACTROBAN) 2 % ointment one Application by Nasal route 2 (two) times daily.     ondansetron  (ZOFRAN -ODT) 4 mg disintegrating tablet Take one tablet (4 mg dose) by mouth every 8 (eight) hours as needed for Nausea.     OZEMPIC, 0.25 OR 0.5 MG/DOSE, 2 MG/3ML SOPN pen injection Inject 0.25 mg into the skin once a week. Sunday     potassium chloride  10 mEq CR tablet Take one tablet (10 mEq dose) by mouth daily.     rosuvastatin  calcium  (CRESTOR ) 10 mg tablet Take one tablet (10 mg dose) by mouth daily.     traMADol  (ULTRAM ) 50 mg tablet Take one tablet (50 mg dose) by mouth 3 (three) times a day as needed for up to 3 days. Max Daily Amount: 150 mg 6 tablet 0   TURMERIC PO Take 500 mg by mouth 2 (two) times daily.     Warfarin Sodium  (COUMADIN  PO) Take 3 mg by mouth daily.     No current facility-administered medications for this visit.    Allergies: Ace inhibitors, Albuterol , Shellfish allergy, and Sulfa antibiotics  Past Medical History:  Diagnosis Date   CVA (cerebral vascular accident) (*) 2009   Hyperlipemia    Hypertension  Past Surgical History:  Procedure Laterality Date   Knee arthroscopy Bilateral 2015   Social History[1] Family History[2]  ROS:The patient denies nausea, vomiting, diarrhea, fever, chills, or bleeding. All other systems are reviewed and are negative except for that mentioned in the history of present illness.  OBJECTIVE:  BP 136/78 (BP Location: Left Upper Arm, Patient Position: Sitting)   Pulse 92   Ht 5' (1.524 m)   Wt 163 lb 12.8 oz (74.3 kg)   SpO2 100%   BMI 31.99 kg/m   Physical Exam:The patient is an elderly white female in no distress. Pupils equal round reactive light and extraocular  movements intact. No thyromegaly.NO lympadenopathy. No JVD. No carotid bruits. Cardiovascular exam reveals RRR.  2/6 systolic murmur at the left lower sternal border. No rub. No gallop. Lungs auscultation: Clear bilaterally.  Extremities: 1+ bilateral lower extremity edema. No cyanosis.Skin reveals no rashes. Neurological exam reveals cranial nerves are intact with no focal deficits. Psychiatric exam reveals the patient is alert and oriented to person place and time.  Lab Results  Component Value Date/Time   INR 1.5 03/14/2024 01:15 AM   INR 1.5 03/13/2024 02:11 AM   INR 1.5 03/12/2024 03:35 PM   Lab Results  Component Value Date   HGB 11.5 03/14/2024   Lab Results  Component Value Date   Creatinine 1.50 (H) 03/14/2024    Assessment: 1. Pacemaker   2. Pulmonary hypertension (*)   3. Heart failure with preserved ejection fraction (HFpEF), unspecified HF chronicity (*)   4. Warfarin anticoagulation     Plan: Patient with moderately severe to severe tricuspid regurgitation of unclear etiology.  She does have a pacemaker lead across her tricuspid valve.  She is interested in pursuing further workup and evaluation for consideration of tricuspid clip.  I do think she is symptomatic and perhaps her symptoms could be helped with reducing her tricuspid regurgitation.  We will plan on transesophageal echocardiogram with a tricuspid imager followed by right and left heart catheterization with me.  This will be coordinated through our clinic coordinator, Randall.  A Shared Decision Making process with the patient took place. The procedure Tri clip was discussed. Risk, benefits and alternatives of medication management were reviewed. The patient was given ample opportunity to ask questions which were all addressed. The patient agrees with the above listed plan.   I spent 63 minutes in total face-to-face and non-face-to-face time on this visit with >50% spent face-to-face counseling/coordinating care  and providing education.  I reviewed laboratory results and counseled the patient on medication management.   Lamar KATHEE Muir, MD  No orders of the defined types were placed in this encounter.         [1] Social History Socioeconomic History   Marital status: Widowed  Tobacco Use   Smoking status: Never    Passive exposure: Never   Smokeless tobacco: Never  Vaping Use   Vaping status: Never Used  Substance and Sexual Activity   Alcohol use: No   Drug use: No  [2] No family history on file.

## 2024-03-18 ENCOUNTER — Other Ambulatory Visit: Payer: Self-pay

## 2024-03-18 ENCOUNTER — Inpatient Hospital Stay (HOSPITAL_COMMUNITY)
Admission: EM | Admit: 2024-03-18 | Discharge: 2024-03-21 | DRG: 291 | Disposition: A | Discharge status: Skilled Nursing Facility | Source: Skilled Nursing Facility | Attending: Internal Medicine | Admitting: Internal Medicine

## 2024-03-18 ENCOUNTER — Inpatient Hospital Stay (HOSPITAL_COMMUNITY)

## 2024-03-18 ENCOUNTER — Emergency Department (HOSPITAL_COMMUNITY)

## 2024-03-18 ENCOUNTER — Encounter (HOSPITAL_COMMUNITY): Payer: Self-pay

## 2024-03-18 DIAGNOSIS — R0602 Shortness of breath: Secondary | ICD-10-CM | POA: Diagnosis present

## 2024-03-18 DIAGNOSIS — I272 Pulmonary hypertension, unspecified: Secondary | ICD-10-CM | POA: Diagnosis present

## 2024-03-18 DIAGNOSIS — I350 Nonrheumatic aortic (valve) stenosis: Secondary | ICD-10-CM | POA: Diagnosis present

## 2024-03-18 DIAGNOSIS — J90 Pleural effusion, not elsewhere classified: Secondary | ICD-10-CM

## 2024-03-18 DIAGNOSIS — R1310 Dysphagia, unspecified: Secondary | ICD-10-CM | POA: Diagnosis present

## 2024-03-18 DIAGNOSIS — J189 Pneumonia, unspecified organism: Secondary | ICD-10-CM | POA: Diagnosis present

## 2024-03-18 DIAGNOSIS — K219 Gastro-esophageal reflux disease without esophagitis: Secondary | ICD-10-CM | POA: Diagnosis present

## 2024-03-18 DIAGNOSIS — E782 Mixed hyperlipidemia: Secondary | ICD-10-CM | POA: Diagnosis present

## 2024-03-18 DIAGNOSIS — I4821 Permanent atrial fibrillation: Secondary | ICD-10-CM | POA: Diagnosis present

## 2024-03-18 DIAGNOSIS — G4733 Obstructive sleep apnea (adult) (pediatric): Secondary | ICD-10-CM

## 2024-03-18 DIAGNOSIS — Z882 Allergy status to sulfonamides status: Secondary | ICD-10-CM

## 2024-03-18 DIAGNOSIS — Z7901 Long term (current) use of anticoagulants: Secondary | ICD-10-CM

## 2024-03-18 DIAGNOSIS — Z79899 Other long term (current) drug therapy: Secondary | ICD-10-CM

## 2024-03-18 DIAGNOSIS — I428 Other cardiomyopathies: Secondary | ICD-10-CM | POA: Diagnosis present

## 2024-03-18 DIAGNOSIS — E669 Obesity, unspecified: Secondary | ICD-10-CM | POA: Diagnosis present

## 2024-03-18 DIAGNOSIS — W06XXXA Fall from bed, initial encounter: Secondary | ICD-10-CM | POA: Diagnosis present

## 2024-03-18 DIAGNOSIS — Z7984 Long term (current) use of oral hypoglycemic drugs: Secondary | ICD-10-CM

## 2024-03-18 DIAGNOSIS — I480 Paroxysmal atrial fibrillation: Secondary | ICD-10-CM

## 2024-03-18 DIAGNOSIS — S0083XA Contusion of other part of head, initial encounter: Secondary | ICD-10-CM | POA: Diagnosis present

## 2024-03-18 DIAGNOSIS — I130 Hypertensive heart and chronic kidney disease with heart failure and stage 1 through stage 4 chronic kidney disease, or unspecified chronic kidney disease: Secondary | ICD-10-CM | POA: Diagnosis present

## 2024-03-18 DIAGNOSIS — I495 Sick sinus syndrome: Secondary | ICD-10-CM | POA: Diagnosis present

## 2024-03-18 DIAGNOSIS — Z66 Do not resuscitate: Secondary | ICD-10-CM | POA: Diagnosis present

## 2024-03-18 DIAGNOSIS — I2510 Atherosclerotic heart disease of native coronary artery without angina pectoris: Secondary | ICD-10-CM | POA: Diagnosis present

## 2024-03-18 DIAGNOSIS — Z888 Allergy status to other drugs, medicaments and biological substances status: Secondary | ICD-10-CM

## 2024-03-18 DIAGNOSIS — Z823 Family history of stroke: Secondary | ICD-10-CM

## 2024-03-18 DIAGNOSIS — J9611 Chronic respiratory failure with hypoxia: Secondary | ICD-10-CM

## 2024-03-18 DIAGNOSIS — Z808 Family history of malignant neoplasm of other organs or systems: Secondary | ICD-10-CM

## 2024-03-18 DIAGNOSIS — I871 Compression of vein: Secondary | ICD-10-CM | POA: Diagnosis present

## 2024-03-18 DIAGNOSIS — Z6831 Body mass index (BMI) 31.0-31.9, adult: Secondary | ICD-10-CM

## 2024-03-18 DIAGNOSIS — N1832 Chronic kidney disease, stage 3b: Secondary | ICD-10-CM | POA: Diagnosis present

## 2024-03-18 DIAGNOSIS — J918 Pleural effusion in other conditions classified elsewhere: Secondary | ICD-10-CM | POA: Diagnosis present

## 2024-03-18 DIAGNOSIS — Z803 Family history of malignant neoplasm of breast: Secondary | ICD-10-CM

## 2024-03-18 DIAGNOSIS — J440 Chronic obstructive pulmonary disease with acute lower respiratory infection: Secondary | ICD-10-CM | POA: Diagnosis present

## 2024-03-18 DIAGNOSIS — I4891 Unspecified atrial fibrillation: Secondary | ICD-10-CM | POA: Diagnosis present

## 2024-03-18 DIAGNOSIS — I5030 Unspecified diastolic (congestive) heart failure: Secondary | ICD-10-CM | POA: Diagnosis not present

## 2024-03-18 DIAGNOSIS — Z9981 Dependence on supplemental oxygen: Secondary | ICD-10-CM

## 2024-03-18 DIAGNOSIS — Z7985 Long-term (current) use of injectable non-insulin antidiabetic drugs: Secondary | ICD-10-CM

## 2024-03-18 DIAGNOSIS — Z8249 Family history of ischemic heart disease and other diseases of the circulatory system: Secondary | ICD-10-CM

## 2024-03-18 DIAGNOSIS — Z90710 Acquired absence of both cervix and uterus: Secondary | ICD-10-CM

## 2024-03-18 DIAGNOSIS — I5033 Acute on chronic diastolic (congestive) heart failure: Secondary | ICD-10-CM

## 2024-03-18 DIAGNOSIS — J9601 Acute respiratory failure with hypoxia: Secondary | ICD-10-CM | POA: Diagnosis present

## 2024-03-18 DIAGNOSIS — W19XXXA Unspecified fall, initial encounter: Principal | ICD-10-CM

## 2024-03-18 DIAGNOSIS — Z950 Presence of cardiac pacemaker: Secondary | ICD-10-CM

## 2024-03-18 DIAGNOSIS — Z79890 Hormone replacement therapy (postmenopausal): Secondary | ICD-10-CM

## 2024-03-18 DIAGNOSIS — Z515 Encounter for palliative care: Secondary | ICD-10-CM

## 2024-03-18 DIAGNOSIS — I509 Heart failure, unspecified: Secondary | ICD-10-CM

## 2024-03-18 DIAGNOSIS — Z8673 Personal history of transient ischemic attack (TIA), and cerebral infarction without residual deficits: Secondary | ICD-10-CM

## 2024-03-18 DIAGNOSIS — I083 Combined rheumatic disorders of mitral, aortic and tricuspid valves: Secondary | ICD-10-CM | POA: Diagnosis present

## 2024-03-18 LAB — BASIC METABOLIC PANEL WITH GFR
Anion gap: 11 (ref 5–15)
BUN: 28 mg/dL — ABNORMAL HIGH (ref 8–23)
CO2: 29 mmol/L (ref 22–32)
Calcium: 9.1 mg/dL (ref 8.9–10.3)
Chloride: 102 mmol/L (ref 98–111)
Creatinine, Ser: 1.34 mg/dL — ABNORMAL HIGH (ref 0.44–1.00)
GFR, Estimated: 37 mL/min — ABNORMAL LOW
Glucose, Bld: 125 mg/dL — ABNORMAL HIGH (ref 70–99)
Potassium: 3.9 mmol/L (ref 3.5–5.1)
Sodium: 141 mmol/L (ref 135–145)

## 2024-03-18 LAB — I-STAT CHEM 8, ED
BUN: 30 mg/dL — ABNORMAL HIGH (ref 8–23)
Calcium, Ion: 1.13 mmol/L — ABNORMAL LOW (ref 1.15–1.40)
Chloride: 98 mmol/L (ref 98–111)
Creatinine, Ser: 1.5 mg/dL — ABNORMAL HIGH (ref 0.44–1.00)
Glucose, Bld: 124 mg/dL — ABNORMAL HIGH (ref 70–99)
HCT: 41 % (ref 36.0–46.0)
Hemoglobin: 13.9 g/dL (ref 12.0–15.0)
Potassium: 3.7 mmol/L (ref 3.5–5.1)
Sodium: 141 mmol/L (ref 135–145)
TCO2: 30 mmol/L (ref 22–32)

## 2024-03-18 LAB — CBC WITH DIFFERENTIAL/PLATELET
Abs Immature Granulocytes: 0.03 K/uL (ref 0.00–0.07)
Basophils Absolute: 0.1 K/uL (ref 0.0–0.1)
Basophils Relative: 1 %
Eosinophils Absolute: 0.3 K/uL (ref 0.0–0.5)
Eosinophils Relative: 3 %
HCT: 40.3 % (ref 36.0–46.0)
Hemoglobin: 12.5 g/dL (ref 12.0–15.0)
Immature Granulocytes: 0 %
Lymphocytes Relative: 17 %
Lymphs Abs: 1.7 K/uL (ref 0.7–4.0)
MCH: 28.9 pg (ref 26.0–34.0)
MCHC: 31 g/dL (ref 30.0–36.0)
MCV: 93.1 fL (ref 80.0–100.0)
Monocytes Absolute: 0.8 K/uL (ref 0.1–1.0)
Monocytes Relative: 7 %
Neutro Abs: 7.3 K/uL (ref 1.7–7.7)
Neutrophils Relative %: 72 %
Platelets: 214 K/uL (ref 150–400)
RBC: 4.33 MIL/uL (ref 3.87–5.11)
RDW: 17.2 % — ABNORMAL HIGH (ref 11.5–15.5)
WBC: 10.2 K/uL (ref 4.0–10.5)
nRBC: 0 % (ref 0.0–0.2)

## 2024-03-18 LAB — PROTIME-INR
INR: 2.6 — ABNORMAL HIGH (ref 0.8–1.2)
Prothrombin Time: 29.1 s — ABNORMAL HIGH (ref 11.4–15.2)

## 2024-03-18 LAB — PRO BRAIN NATRIURETIC PEPTIDE: Pro Brain Natriuretic Peptide: 4427 pg/mL — ABNORMAL HIGH

## 2024-03-18 MED ORDER — EMPAGLIFLOZIN 10 MG PO TABS
10.0000 mg | ORAL_TABLET | Freq: Every day | ORAL | Status: DC
  Administered 2024-03-18 – 2024-03-21 (×4): 10 mg via ORAL
  Filled 2024-03-18 (×4): qty 1

## 2024-03-18 MED ORDER — VITAMIN D 25 MCG (1000 UNIT) PO TABS
1000.0000 [IU] | ORAL_TABLET | Freq: Every day | ORAL | Status: DC
  Administered 2024-03-18 – 2024-03-21 (×4): 1000 [IU] via ORAL
  Filled 2024-03-18 (×4): qty 1

## 2024-03-18 MED ORDER — ROSUVASTATIN CALCIUM 5 MG PO TABS
10.0000 mg | ORAL_TABLET | Freq: Every day | ORAL | Status: DC
  Administered 2024-03-18 – 2024-03-20 (×3): 10 mg via ORAL
  Filled 2024-03-18 (×3): qty 2

## 2024-03-18 MED ORDER — AMOXICILLIN-POT CLAVULANATE 500-125 MG PO TABS
1.0000 | ORAL_TABLET | Freq: Two times a day (BID) | ORAL | Status: DC
  Administered 2024-03-18 – 2024-03-21 (×6): 1 via ORAL
  Filled 2024-03-18 (×8): qty 1

## 2024-03-18 MED ORDER — ONDANSETRON 4 MG PO TBDP: 4.0000 mg | ORAL_TABLET | Freq: Three times a day (TID) | ORAL | Status: DC | PRN

## 2024-03-18 MED ORDER — ACETAMINOPHEN 325 MG PO TABS
650.0000 mg | ORAL_TABLET | Freq: Four times a day (QID) | ORAL | Status: DC | PRN
  Administered 2024-03-18 – 2024-03-20 (×2): 650 mg via ORAL
  Filled 2024-03-18 (×2): qty 2

## 2024-03-18 MED ORDER — POLYETHYLENE GLYCOL 3350 17 G PO PACK
17.0000 g | PACK | Freq: Every day | ORAL | Status: DC
  Filled 2024-03-18 (×2): qty 1

## 2024-03-18 MED ORDER — ACETAMINOPHEN 500 MG PO TABS
1000.0000 mg | ORAL_TABLET | Freq: Once | ORAL | Status: AC
  Administered 2024-03-18: 1000 mg via ORAL
  Filled 2024-03-18: qty 2

## 2024-03-18 MED ORDER — LIDOCAINE-EPINEPHRINE 1 %-1:100000 IJ SOLN: 20.0000 mL | Freq: Once | INTRAMUSCULAR | Status: DC

## 2024-03-18 MED ORDER — FUROSEMIDE 40 MG PO TABS
40.0000 mg | ORAL_TABLET | Freq: Two times a day (BID) | ORAL | Status: DC
  Administered 2024-03-18 – 2024-03-21 (×7): 40 mg via ORAL
  Filled 2024-03-18 (×2): qty 1
  Filled 2024-03-18 (×2): qty 2
  Filled 2024-03-18 (×3): qty 1

## 2024-03-18 MED ORDER — WARFARIN SODIUM 3 MG PO TABS: 3.0000 mg | ORAL_TABLET | Freq: Every evening | ORAL | Status: DC

## 2024-03-18 MED ORDER — METRONIDAZOLE 500 MG/100ML IV SOLN
500.0000 mg | Freq: Once | INTRAVENOUS | Status: AC
  Administered 2024-03-18: 500 mg via INTRAVENOUS
  Filled 2024-03-18: qty 100

## 2024-03-18 MED ORDER — VANCOMYCIN HCL IN DEXTROSE 1-5 GM/200ML-% IV SOLN: 1000.0000 mg | Freq: Once | INTRAVENOUS | Status: DC

## 2024-03-18 MED ORDER — AMOXICILLIN-POT CLAVULANATE 875-125 MG PO TABS: 1.0000 | ORAL_TABLET | Freq: Two times a day (BID) | ORAL | Status: DC

## 2024-03-18 MED ORDER — FUROSEMIDE 10 MG/ML IJ SOLN
40.0000 mg | Freq: Once | INTRAMUSCULAR | Status: AC
  Administered 2024-03-18: 40 mg via INTRAVENOUS
  Filled 2024-03-18: qty 4

## 2024-03-18 MED ORDER — WARFARIN SODIUM 2 MG PO TABS: 2.0000 mg | ORAL_TABLET | Freq: Once | ORAL | Status: DC

## 2024-03-18 MED ORDER — SODIUM CHLORIDE 0.9 % IV SOLN
2.0000 g | Freq: Once | INTRAVENOUS | Status: AC
  Administered 2024-03-18: 2 g via INTRAVENOUS
  Filled 2024-03-18: qty 12.5

## 2024-03-18 MED ORDER — WARFARIN SODIUM 3 MG PO TABS: 3.0000 mg | ORAL_TABLET | Freq: Once | ORAL | Status: DC

## 2024-03-18 MED ORDER — IPRATROPIUM-ALBUTEROL 0.5-2.5 (3) MG/3ML IN SOLN: 3.0000 mL | RESPIRATORY_TRACT | Status: DC | PRN

## 2024-03-18 MED ORDER — LIDOCAINE-EPINEPHRINE 1 %-1:100000 IJ SOLN
INTRAMUSCULAR | Status: AC
  Filled 2024-03-18: qty 20

## 2024-03-18 MED ORDER — ONDANSETRON HCL 4 MG/2ML IJ SOLN: 4.0000 mg | Freq: Four times a day (QID) | INTRAMUSCULAR | Status: DC | PRN

## 2024-03-18 MED ORDER — VANCOMYCIN HCL 1500 MG/300ML IV SOLN
1500.0000 mg | Freq: Once | INTRAVENOUS | Status: AC
  Administered 2024-03-18: 1500 mg via INTRAVENOUS
  Filled 2024-03-18: qty 300

## 2024-03-18 MED ORDER — POLYETHYLENE GLYCOL 3350 17 G PO PACK: 17.0000 g | PACK | Freq: Every day | ORAL | Status: DC

## 2024-03-18 MED ORDER — WARFARIN - PHARMACIST DOSING INPATIENT: Freq: Every day | Status: DC

## 2024-03-18 NOTE — Consult Note (Addendum)
 "  Cardiology Consultation   Patient ID: Lynn Dunn MRN: [id number]; DOB: 03/21/1930  Admit date: 03/18/2024 Date of Consult: 03/18/2024  PCP:  Melchor Ozell PARAS, MD (Inactive)   [facility] Providers Cardiologist:  Candyce Reek, MD  Electrophysiologist:  Lynwood Rakers, MD (Inactive)     Patient Profile: Lynn Dunn is a 89 y.o. female with a hx of permanent atrial fibrillation on Coumadin , sick sinus syndrome s/p dual-chamber Medtronic PPM implant 02/2015 ([facility]), heart failure with improved EF, aortic stenosis, tricuspid regurgitation, non-occlusive CAD, hypertension, hyperlipidemia, orthostatic hypotension, OSA on CPAP, COPD, prior CVA, CKD stage III, prediabetes, who is being seen 03/18/2024 for the evaluation of abnormal cardiac enzymes at the request of Dr Jillian.  History of Present Illness: Lynn Dunn with above complex past medical history presented to the ER today complaining mechanical fall while living at [facility].  She was getting up from bed and lost her balance, possibly tripped on something, subsequently fell and hit her head and bilateral knees.  She never lost consciousness.  She denied having any chest pain.  She was complaining shortness of breath at ED.  She has required 2 L nasal cannula oxygen  at ED. She states her SOB is chronic, seems getting worse over the past year. She is on [facility] for a year now. She is following [facility] cardiology and wants to be considered for tricuspid clip. She states her PPM has 4-6 month left for battery. She has not eat anything since yesterday, she is very thirsty and hungry.   Workup at ED today showed elevated creatinine 1.34 and GFR of 37 and BUN of 28.  CBC diff grossly unremarkable.  INR 2.6.  proBNP elevated to 4427. CXR revealed new right-sided pleural effusion with underlying atelectasis or infiltrate.  CT had showed no acute intracranial/parenchymal hemorrhage, left frontal scalp hematoma was noted.  CT cervical  spine revealed no acute traumatic injury.  CT chest revealed new moderate size right pleural effusion with associated compressive atelectasis, subtle hazy centrilobular attenuation over the right upper lobe which could be seen with infectious process versus atypical infection/inflammation, stable mild cardiomegaly, atherosclerotic vascular disease including left main and three-vessel coronary arteries, mild nodular contour of liver which can be seen with cirrhosis.  Blood culture x 2 has been sent.  Due to abnormal elevated proBNP, cardiology is consulted for further evaluation of heart failure.    Per extensive chart review, patient previously followed at [facility] and later transition to [facility] cardiology for cadiac care.   According to [facility] records, she had a low risk negative stress Myoview 05/13/2021.  Echo from 05/20/2021 showed LVEF 55 to 60%, grade 1 DD, borderline concentric LVH, normal RV, severe LAE, mild aortic stenosis, mild AI, mild MR, mild to moderate TR, mild pulmonary hypertension.  Heart catheterization from 08/12/2021 showed mild diffuse disease of LAD and left circumflex, RCA not visualized no intervention required; RA 19/18, RV 49/12, PA 47/22, PCW 19/19, mean 16, Fick CO 2.68 l/min , Fick CI 1.46 l/min/m2.   She had followed with [facility] Dr. Meldon in the past, had dual-chamber Medtronic pacemaker placed 02/2015. She later developed atrial fibrillation, initially was on metoprolol  and diltiazem  and Coumadin , underwent ablation which had A-fib absence for 3 years, amiodarone  was used and stopped at one point due to side effect.  She had a fairly high burden atrial fibrillation 2025, she was arranged for AV nodal ablation and pacemaker upgrade to biventricular PPM 10/2023, this was not  preceded because venogram showed left subclavian vein and axillary vein were stenotic and she was discovered with severe tricuspid regurgitation and referred to structural heart for further  evaluation.    She saw Dr. Toribio Million 11/11/23 for tricuspid regurgitation, upon review of echo, felt tricuspid valve leaflets normal with dilated right ventricle and PPM lead which were impairing leaflet coaptation.  She had no significant symptom from TR and it was felt there is no indication for tricuspid valve clip.  Her aortic stenosis was felt mild and not contributing to her complaint/symptom of dyspnea.   She transition to [facility] Dr. Autry 12/12/2023, continue to complain moderate dyspnea on exertion.  It was felt her respiratory symptom is multifactorial.  It was suggested to proceed with AV nodal ablation while maintaining her current pacemaker, to see if this would help her symptoms.  Upgrading to biventricular pacemaker from the right side can be considered later if her symptoms persist or EF declines.  She was noted 18% ventricular pacing on 04/2023 and 60% ventricular pacing on 07/2023.  Her echocardiogram from August 2025 showed LVEF 45%, moderately dilated RV, severe bilateral atrial dilatation, moderate to severe TR and mild to moderate AS.    She underwent AVN ablation 01/04/24 by Dr Autry at [place].   She saw [facility] Dr. Nicholaus 02/13/2024, device interrogation from 02/13/24 at the office showed stable lead parameters and 80% V pacing and 100% A-fib burden. Pacemaker setting was changed from VVIR 80 to VVIR 70.  It was felt that she requires an upgrade to CRT, although repeat venogram is planned prior given known left-sided vein stenosis.  Repeat echogram on 02/14/2024 showed LVEF 50 to 55%, mild dilated RV, severe LAE, severe MAC, mild to moderate mitral regurgitation, moderate to severe tricuspid regurgitation, RVSP 50 to 59 mmHg, mild aortic stenosis with peak gradient 17 mmHg and mean gradient 10 mmHg and mild AI.   She was hospitalized 03/12/2024 at [facility] for CHF and pleural effusion, was diuresed and received a thoracentesis.  She followed up with structural heart Dr. Lawana 03/17/2024  cuspid regurgitation, felt etiology unclear, did notice pacemaker lead across her tricuspid valve, felt she is symptomatic from severe tricuspid regurg and further workup and evaluation of tricuspid clip was recommended.  She was planned for transesophageal echocardiogram and right and left heart catheterization.    Past Medical History:  Diagnosis Date   Airway malacia    Anemia    Anginal pain 2011   had to take nitro    Arthritis    COPD (chronic obstructive pulmonary disease) (HCC)    Dysuria    GERD (gastroesophageal reflux disease)    Hematuria    HTN (hypertension)    Hx of echocardiogram    Echo (12/15):  Mild LVH, EF 60-65%, mild MR, mod LAE (LA 49 mm), PASP 43 mmHg   Obesity    OSA (obstructive sleep apnea)    cpap at home   PAF (paroxysmal atrial fibrillation) (HCC)    s/p ablation 07/29/09   Sick sinus syndrome (HCC)    pauses with syncope   Stroke (HCC) 6/09   NO RESIDUAL PROBLEMS    Past Surgical History:  Procedure Laterality Date   ABDOMINAL HYSTERECTOMY     ATRIAL ABLATION SURGERY     s/p ablation for afib 07/29/09   CYSTOSCOPY N/A 06/23/2015   Procedure: CYSTOSCOPY;  Surgeon: Ricardo Likens, MD;  Location: [facility];  Service: Urology;  Laterality: N/A;   EP IMPLANTABLE DEVICE N/A 02/17/2015  MDT Advisa MRI PPM implanted by Dr Waddell for sick sinus and syncope   EP IMPLANTABLE DEVICE N/A 02/17/2015   Procedure: Loop Recorder Removal;  Surgeon: Danelle LELON Waddell, MD;  Location: [facility] INVASIVE CV LAB;  Service: Cardiovascular;  Laterality: N/A;   ESOPHAGOGASTRODUODENOSCOPY  01/24/2012   Procedure: ESOPHAGOGASTRODUODENOSCOPY (EGD);  Surgeon: Oliva FORBES Boots, MD;  Location: [facility] ENDOSCOPY;  Service: Endoscopy;  Laterality: N/A;   KNEE ARTHROSCOPY     BILATERAL   LOOP RECORDER IMPLANT N/A 03/12/2014   Procedure: LOOP RECORDER IMPLANT;  Surgeon: Lynwood Rakers, MD;  Location: [facility] CATH LAB;  Service: Cardiovascular;  Laterality: N/A;   LYSIS OF ADHESION N/A 06/23/2015   Procedure:  LYSIS OF LABIAL ADHESION;  Surgeon: Ricardo Likens, MD;  Location: [facility];  Service: Urology;  Laterality: N/A;   VIDEO BRONCHOSCOPY Bilateral 12/15/2016   Procedure: VIDEO BRONCHOSCOPY WITHOUT FLUORO;  Surgeon: Shelah Lamar RAMAN, MD;  Location: [facility] ENDOSCOPY;  Service: Cardiopulmonary;  Laterality: Bilateral;     Home Medications:  Prior to Admission medications  Medication Sig Start Date End Date Taking? Authorizing Provider  calcitRIOL (ROCALTROL) 0.25 MCG capsule Take 0.25 mcg by mouth daily.  05/07/17  Yes [provider]  Cholecalciferol  25 MCG (1000 UT) tablet Take 1,000 Units by mouth daily.   Yes [provider]  empagliflozin  (JARDIANCE ) 10 MG TABS tablet Take 10 mg by mouth daily.   Yes [provider]  furosemide  (LASIX ) 40 MG tablet Take 40 mg by mouth 2 (two) times daily.   Yes [provider]  HYDROcodone  bit-homatropine (HYCODAN) 5-1.5 MG/5ML syrup Take 5 mLs by mouth every 12 (twelve) hours as needed for cough.   Yes [provider]  Magnesium  100 MG CAPS Take 100 mg by mouth daily at 12 noon.   Yes [provider]  meclizine (ANTIVERT) 25 MG tablet Take 25 mg by mouth every 12 (twelve) hours as needed for dizziness.   Yes [provider]  Misc Natural Products (TURMERIC, CURCUMIN, PO) Take 500 mg by mouth 2 (two) times daily.   Yes [provider]  mupirocin ointment (BACTROBAN) 2 % Apply 1 Application topically See admin instructions. Apply  to nasal topically times a day for nasal health for 14 days. 03/15/24 03/29/24 Yes [provider]  ondansetron  (ZOFRAN -ODT) 4 MG disintegrating tablet Take 4 mg by mouth every 8 (eight) hours as needed for nausea or vomiting.   Yes [provider]  polyethylene glycol (MIRALAX  / GLYCOLAX ) 17 g packet Take 17 g by mouth daily.   Yes [provider]  potassium chloride  (KLOR-CON ) 10 MEQ tablet Take 10 mEq by mouth daily.   Yes [provider]   rosuvastatin  (CRESTOR ) 10 MG tablet Take 10 mg by mouth at bedtime.  09/14/16  Yes [provider]  warfarin (COUMADIN ) 3 MG tablet Take 3 mg by mouth every evening.   Yes [provider]  Semaglutide (OZEMPIC, 0.25 OR 0.5 MG/DOSE, [place]) Inject 0.25 mg into the skin every Sunday. Patient not taking: Reported on 03/18/2024    [provider]    Scheduled Meds:  amoxicillin -clavulanate  1 tablet Oral Q12H   cholecalciferol   1,000 Units Oral Daily   empagliflozin   10 mg Oral Daily   furosemide   40 mg Oral BID   lidocaine -EPINEPHrine   20 mL Intradermal Once   polyethylene glycol  17 g Oral Daily   rosuvastatin   10 mg Oral QHS   warfarin  2 mg Oral Once   Warfarin -  Pharmacist Dosing Inpatient   Does not apply q1600   Continuous Infusions:  PRN Meds: acetaminophen , ipratropium-albuterol , ondansetron  (ZOFRAN ) IV, ondansetron   Allergies:   Allergies[1]  Social History:   Social History   Socioeconomic History   Marital status: Widowed    Spouse name: Not on file   Number of children: Not on file   Years of education: Not on file   Highest education level: Not on file  Occupational History   Not on file  Tobacco Use   Smoking status: Never   Smokeless tobacco: Never  Vaping Use   Vaping status: Never Used  Substance and Sexual Activity   Alcohol use: Yes    Alcohol/week: 2.0 standard drinks of alcohol    Types: 2 Glasses of wine per week    Comment: occassionally   Drug use: No   Sexual activity: Not on file  Other Topics Concern   Not on file  Social History Narrative   Not on file   Social Drivers of Health   Tobacco Use: Low Risk (03/18/2024)   Patient History    Smoking Tobacco Use: Never    Smokeless Tobacco Use: Never    Passive Exposure: Not on file  Financial Resource Strain: Low Risk (03/17/2024)   Received from [facility]   Overall Financial Resource Strain (CARDIA)    How hard is it for you to pay for the very basics like  food, housing, medical care, and heating?: Not very hard  Food Insecurity: No Food Insecurity (03/17/2024)   Received from [facility]   Epic    Within the past 12 months, you worried that your food would run out before you got the money to buy more.: Never true    Within the past 12 months, the food you bought just didn't last and you didn't have money to get more.: Never true  Transportation Needs: No Transportation Needs (03/17/2024)   Received from [facility]    In the past 12 months, has lack of transportation kept you from medical appointments or from getting medications?: No    In the past 12 months, has lack of transportation kept you from meetings, work, or from getting things needed for daily living?: No  Physical Activity: Not on file  Stress: Patient Declined (03/12/2024)   Received from [facility] - Occupational Stress Questionnaire    Do you feel stress - tense, restless, nervous, or anxious, or unable to sleep at night because your mind is troubled all the time - these days?: Patient declined  Social Connections: Unknown (07/15/2021)   Received from [facility]   Social Network    Social Network: Not on file  Intimate Partner Violence: Not At Risk (03/12/2024)   Received from [facility]   HITS    Over the last 12 months how often did your partner physically hurt you?: Never    Over the last 12 months how often did your partner insult you or talk down to you?: Never    Over the last 12 months how often did your partner threaten you with physical harm?: Never    Over the last 12 months how often did your partner scream or curse at you?: Never  Depression (PHQ2-9): Not on file  Alcohol Screen: Not on file  Housing: Low Risk (03/17/2024)   Received from [facility]    In the last 12 months, was there a time when  you were not able to pay the mortgage or rent on time?: No    In the past 12 months, how many times  have you moved where you were living?: 0    At any time in the past 12 months, were you homeless or living in a shelter (including now)?: No  Utilities: Not At Risk (03/17/2024)   Received from [facility]    In the past 12 months has the electric, gas, oil, or water  company threatened to shut off services in your home?: No  Health Literacy: Not on file    Family History:    Family History  Problem Relation Age of Onset   Breast cancer Mother    Brain cancer Father    Breast cancer Other    Brain cancer Other    Stroke Maternal Aunt    Hypertension Sister    Heart attack Neg Hx      ROS:  Constitutional: Denied fever, chills, malaise, night sweats Eyes: Denied vision change or loss Ears/Nose/Mouth/Throat: Denied ear ache, sore throat, coughing, sinus pain Cardiovascular: denied chest pain/pressure Respiratory: see HPI  Gastrointestinal: Denied nausea, vomiting, abdominal pain, diarrhea Genital/Urinary: Denied dysuria, hematuria, urinary frequency/urgency Musculoskeletal: Fall  Skin: Denied rash, wound Neuro: Denied headache, dizziness, syncope Psych: Denied history of depression/anxiety  Endocrine: history of pre-diabetes   Physical Exam/Data: Vitals:   03/18/24 1115 03/18/24 1130 03/18/24 1354 03/18/24 1427  BP: (!) 162/97   (!) 164/79  Pulse: (!) 25 77  74  Resp: (!) 21 (!) 23  (!) 21  Temp:   97.9 F (36.6 C)   TempSrc:   Oral   SpO2: 92% 100%  95%  Weight:      Height:        Intake/Output Summary (Last 24 hours) at 03/18/2024 1451 Last data filed at 03/18/2024 0340 Gross per 24 hour  Intake 0 ml  Output --  Net 0 ml      03/18/2024    4:00 AM 03/18/2024    3:41 AM 04/12/2022    9:39 AM  Last 3 Weights  Weight (lbs) 163 lb 163 lb 190 lb  Weight (kg) 73.936 kg 73.936 kg 86.183 kg     Body mass index is 31.83 kg/m.   Vitals:  Vitals:   03/18/24 1354 03/18/24 1427  BP:  (!) 164/79  Pulse:  74  Resp:  (!) 21  Temp: 97.9 F (36.6 C)    SpO2:  95%   General Appearance: In no apparent distress, laying in bed, frail elderly  HEENT: Normocephalic, atraumatic.  Neck: Supple, trachea midline, elevated JVD Cardiovascular: irregular, systolic murmur  Respiratory: Resting breathing unlabored, lungs sounds diminished at right base to auscultation , no use of accessory muscles. On 2LNC. Pox 100%. Speaks full sentence.   Gastrointestinal: Bowel sounds positive, abdomen soft Extremities: Able to move all extremities in bed without difficulty, no leg edema Musculoskeletal: Normal muscle bulk and tone Skin: Intact, warm, dry.  Neurologic: Alert, oriented to person, place and time.  no gross focal neuro deficit Psychiatric: Normal affect. Mood is appropriate.    EKG:  The EKG was personally reviewed and demonstrates:    EKG today revealing atrial fibrillation with ventricular rate of 70s, intermittently paced  Telemetry:  Telemetry was personally reviewed and demonstrates:    A fib with ventricular rate of 80s   Relevant CV Studies:   Most recent echo from 02-16-24 at [facility]:  Left Ventricle: Systolic function is low  normal. EF: 50-55%. Wall  motion is normal.    Aortic Valve: There is mild stenosis, with peak and mean gradients of  17.000 and 10.000 mmHg.    Tricuspid Valve: There is moderate to severe regurgitation. The right  ventricular systolic pressure is moderately elevated (50-59 mmHg).   Left Ventricle  Left ventricle size is normal. Wall thickness is normal. Systolic function is low normal. EF: 50-55%. Wall motion is normal. Unable to assess diastolic function due to atrial fibrillation.   Right Ventricle  Right ventricle is mildly dilated. Systolic function is normal. A pacing/ICD lead is noted in the right ventricle.   Left Atrium  Left atrium is severely dilated.   Right Atrium  Right atrium size is normal.   IVC/SVC  The inferior vena cava demonstrates a diameter of >2.1 cm and collapses  <50%; therefore, the right atrial pressure is estimated at 15 mmHg.   Mitral Valve  Mitral valve structure is normal. There is severe annular calcification. There is mild to moderate regurgitation.   Tricuspid Valve  Tricuspid valve structure is normal. There is moderate to severe regurgitation. The right ventricular systolic pressure is moderately elevated (50-59 mmHg).   Aortic Valve  The leaflets are moderately calcified. Mild aortic valve regurgitation. There is mild stenosis, with peak and mean gradients of 17.000 and 10.000 mmHg.   Pulmonic Valve  The pulmonic valve was not well visualized. Trace regurgitation. There is no evidence of pulmonic valve stenosis.   Ascending Aorta  The aortic root is normal in size. The ascending aorta is normal in size.   Pericardium  There is no pericardial effusion.    Echocardiogram from 11/01/2023 at [facility]  The left ventricular size is normal.  There is normal left ventricular wall thickness.  Left ventricular systolic function is mildly reduced.  LV ejection fraction = 45%.  Abnormal  paradoxical  septal motion consistent with LBBB.  The right ventricle is moderately dilated.  The right ventricular systolic function is mild to moderately reduced.  Device lead in the right ventricle  The left atrium is severely dilated.  The right atrium is severely dilated.  There is mild aortic regurgitation.  There is mild mitral regurgitation.  There is moderate to severe tricuspid regurgitation.  Mild pulmonary hypertension.  Trace to mild pulmonic valvular regurgitation.  The aortic sinus is normal size.  IVC size was severely dilated.  There is no pericardial effusion.  There is no comparison study available.  11-01-23 Ovverread requested-  There is mild to moderate aortic stenosis. The calculated aortic valve area using the continuity  equation is 1.1 cm2. Direct planimetry AVA also 1.1 cm2. Appears physiologically moderate for small   BSA and minimal gradients. Gradients are reduced due to SVI < 71ml-m2 and also afib.   Cardiac catheterization on 08/12/2021 at [facility]:  Pressures   RA 19 mmHg/18 mmHg/14 mmHg  RV 49 mmHg/12 mmHg/18 mmHg  PA 47 mmHg/22 mmHg/32 mmHg  PCW 19 mmHg/19 mmHg/16 mmHg  LA  / /   LV 149 mmHg/-1 mmHg/19 mmHg  AO 135 mmHg/48 mmHg/80 mmHg  BP 142/61    Saturations   IVC Saturation O2-Sat    SVC Saturation O2-Sat    RA Saturation O2-Sat    RV Saturation O2-Sat    PA Saturation O2-Sat 61  PA Saturation O2-Cont 97.1  PCW Saturation O2-Sat    AO Saturation O2-Sat 97  AO Saturation O2-cont 155.7    Derived Parameters   PVR 233.2  PVR  Index 427.3  SVR 1819.2  SVR Index 3333.2    Cardiac Output   Fick A-V O2 Diff 58.6 mL/L  Fick O2 Consumption    Fick Predicted O2 Consumption 156.9 mL/min  Fick CO 2.68 l/min  Fick CI 1.46 l/min/m2  Thermo CO 3.43 l/min  Thermo CI 1.87 l/min/m2    Ventricular Data   LV Max dp/dt 1333 mmHg/s  LV Max dp/dt/p    RV Max dp/dt 247 mmHg/s  RV Max dp/dt/p       EBL < 5 cc, prelude sync for radial and manual pressure for AC hemostasis,  no specimens.  Total contrast 6 cc.    Coronary Findings Diagnostic Dominance: Co-dominant  Left Anterior Descending: There is mild diffuse disease throughout the vessel.  Left Circumflex: There is mild diffuse disease throughout the vessel.  Right Coronary Artery: The vessel was not visualized.   Intervention  No interventions have been documented.   Laboratory Data: High Sensitivity Troponin:  No results for input(s): TROPONINIHS in the last 720 hours. No results for input(s): TRNPT in the last 720 hours.    Chemistry Recent Labs  Lab 03/18/24 0342 03/18/24 0346  NA 141 141  K 3.9 3.7  CL 102 98  CO2 29  --   GLUCOSE 125* 124*  BUN 28* 30*  CREATININE 1.34* 1.50*  CALCIUM  9.1  --   GFRNONAA 37*  --   ANIONGAP 11  --     No results for input(s): PROT, ALBUMIN, AST, ALT,  ALKPHOS, BILITOT in the last 168 hours. Lipids No results for input(s): CHOL, TRIG, HDL, LABVLDL, LDLCALC, CHOLHDL in the last 168 hours.  Hematology Recent Labs  Lab 03/18/24 0342 03/18/24 0346  WBC 10.2  --   RBC 4.33  --   HGB 12.5 13.9  HCT 40.3 41.0  MCV 93.1  --   MCH 28.9  --   MCHC 31.0  --   RDW 17.2*  --   PLT 214  --    Thyroid  No results for input(s): TSH, FREET4 in the last 168 hours.  BNP Recent Labs  Lab 03/18/24 0436  PROBNP 4,427.0*    DDimer No results for input(s): DDIMER in the last 168 hours.  Radiology/Studies:  IR US  CHEST Result Date: 03/18/2024 INDICATION: Patient with right moderate pleural effusion with request for diagnostic and therapeutic thoracentesis. Recently underwent thoracentesis at [facility] this week with labs drawn at that time. EXAM: CHEST ULTRASOUND COMPARISON:  03/18/24 CT Chest FINDINGS: Moderate right-sided pleural effusion. Pre-procedural US  imaging absolutely supports moderate amount of fluid; however, lung flap present close with borderline window for puncture. Risks outweigh the benefits. IMPRESSION: Moderate right-sided pleural effusion with poor window for percutaneous access. Recommend bringing patient back down tomorrow to see if any increase in fluid and improved window for puncture given labs previously drawn at puncture by [facility] within the last week and that fluid accumulates so quickly. Performed by Laymon Coast, NP Electronically Signed   By: Juliene Balder M.D.   On: 03/18/2024 14:25   CT CHEST WO CONTRAST Addendum Date: 03/18/2024 ADDENDUM REPORT: 03/18/2024 10:19 ADDENDUM: Note that there is stable minimal bilateral pleural calcification which may be due to prior trauma versus asbestos related pleural disease. Electronically Signed   By: Toribio Agreste M.D.   On: 03/18/2024 10:19   Result Date: 03/18/2024 CLINICAL DATA:  Possible pleural effusion. Pneumonia. Shortness of breath. EXAM: CT CHEST WITHOUT  CONTRAST TECHNIQUE: Multidetector CT imaging of the chest was performed following  the standard protocol without IV contrast. RADIATION DOSE REDUCTION: This exam was performed according to the departmental dose-optimization program which includes automated exposure control, adjustment of the mA and/or kV according to patient size and/or use of iterative reconstruction technique. COMPARISON:  04/12/2022 FINDINGS: Cardiovascular: Mild stable cardiomegaly. Left-sided cardiac pacemaker is present. Moderate calcified plaque over the mitral valve annulus. Calcified plaque over the left main and 3 vessel atherosclerotic coronary arteries. Ascending thoracic aorta measures 3.9 cm unchanged. There is calcified plaque throughout the thoracic aorta. Pulmonary arterial system is unremarkable on this noncontrast exam. Remaining vascular structures are unchanged. Mediastinum/Nodes: No evidence of mediastinal or hilar adenopathy on this noncontrast exam. Remaining mediastinal structures are unremarkable. Lungs/Pleura: Lungs are adequately inflated. There is a new moderate size right pleural effusion with associated compressive atelectasis in the right base. Mild scarring and atelectasis over the right middle lobe. There is subtle hazy centrilobular attenuation over the right upper lobe which could be seen with an infectious process versus atypical infectious/inflammatory process. Minimal linear scarring over the lingula/left upper lobe. Airways are unremarkable. Upper Abdomen: Images through the upper abdomen demonstrate mild nodular contour of the liver which can be seen with cirrhosis. Calcified plaque over the abdominal aorta which is normal in caliber. Diverticulosis of the colon. Musculoskeletal: Moderate degenerative changes of the spine. IMPRESSION: 1. New moderate size right pleural effusion with associated compressive atelectasis in the right base. 2. Subtle hazy centrilobular attenuation over the right upper lobe which  could be seen with an infectious process versus atypical infectious/inflammatory process. 3. Stable mild cardiomegaly. Atherosclerotic vascular disease including the left main and 3 vessel coronary arteries. 4. Mild nodular contour of the liver which can be seen with cirrhosis. 5. Diverticulosis of the colon. 6. Aortic atherosclerosis. Aortic Atherosclerosis (ICD10-I70.0). Electronically Signed: By: Toribio Agreste M.D. On: 03/18/2024 09:14   CT Cervical Spine Wo Contrast Result Date: 03/18/2024 EXAM: CT CERVICAL SPINE WITHOUT CONTRAST 03/18/2024 03:57:52 AM TECHNIQUE: CT of the cervical spine was performed without the administration of intravenous contrast. Multiplanar reformatted images are provided for review. Automated exposure control, iterative reconstruction, and/or weight based adjustment of the mA/kV was utilized to reduce the radiation dose to as low as reasonably achievable. COMPARISON: CT of the cervical spine dated 04/12/2022. CLINICAL HISTORY: Polytrauma, blunt. FINDINGS: BONES AND ALIGNMENT: Mild straightening of the normal cervical lordosis, as before. No acute fracture or traumatic malalignment. DEGENERATIVE CHANGES: Diffuse chronic degenerative disc disease and facet arthrosis throughout the cervical spine. SOFT TISSUES: No prevertebral soft tissue swelling. There is extensive calcific atheromatous disease, particularly involving the carotid bulbs. LUNGS: There is moderate right-sided pleural effusion. IMPRESSION: 1. No evidence of acute traumatic injury. 2. Mild straightening of the normal cervical lordosis, as before. 3. Diffuse chronic degenerative disc disease and facet arthrosis throughout the cervical spine. 4. Extensive calcific atheromatous disease, particularly involving the carotid bulbs. 5. Moderate right pleural effusion. Electronically signed by: Evalene Coho MD MD 03/18/2024 04:17 AM EST RP Workstation: [id number]   CT Head Wo Contrast Result Date: 03/18/2024 EXAM: CT HEAD  WITHOUT CONTRAST 03/18/2024 03:57:52 AM TECHNIQUE: CT of the head was performed without the administration of intravenous contrast. Automated exposure control, iterative reconstruction, and/or weight based adjustment of the mA/kV was utilized to reduce the radiation dose to as low as reasonably achievable. COMPARISON: 10/24/2023 CLINICAL HISTORY: Polytrauma, blunt. FINDINGS: BRAIN AND VENTRICLES: No acute hemorrhage. No evidence of acute infarct. Age-related cerebral volume loss. Moderate periventricular and deep cerebral white matter disease. Chronic lacunar infarct within  the left internal capsule. No hydrocephalus. No extra-axial collection. No mass effect or midline shift. ORBITS: Bilateral lens replacement. SINUSES: No acute abnormality. SOFT TISSUES AND SKULL: Left frontal scalp hematoma. No skull fracture. VASCULATURE: Moderate calcific atheromatous disease within the carotid siphons and vertebral arteries. IMPRESSION: 1. No acute intracranial hemorrhage, mass effect, or acute territorial infarct related to polytrauma. 2. Left frontal scalp hematoma. 3. Chronic microvascular ischemic changes with chronic lacunar infarct in the left internal capsule and age-related cerebral volume loss. Electronically signed by: Evalene Coho MD MD 03/18/2024 04:13 AM EST RP Workstation: [id number]   DG Chest Portable 1 View Result Date: 03/18/2024 EXAM: 1 VIEW(S) XRAY OF THE CHEST 03/18/2024 03:55:16 AM COMPARISON: 08/11/2022 CLINICAL HISTORY: fallonthinners FINDINGS: LINES, TUBES AND DEVICES: Left chest cardiac device. LUNGS AND PLEURA: Mild vascular congestion is noted. New right-sided pleural effusion is noted with underlying atelectasis/infiltrate. Left lung remains clear. No pneumothorax. HEART AND MEDIASTINUM: Cardiomegaly. Aortic atherosclerosis. BONES AND SOFT TISSUES: No acute osseous abnormality. IMPRESSION: 1. New right-sided pleural effusion with underlying atelectasis or infiltrate. Electronically signed  by: Oneil Devonshire MD MD 03/18/2024 04:00 AM EST RP Workstation: [id number]     Assessment and Plan:  Chronic hypoxic respiratory failure - On 2LNC at baseline, on 2LNC now, she does have known COPD, query if pneumonia is contributing, will diurese for CHF  Acute heart failure with improved LVEF Nonischemic cardiomyopathy Mild aortic stenosis  Moderate to severe tricuspid regurgitation  - Patient presented with a fall, complained chronic shortness of breath worsening for 1 year, found to be hypoxic requiring 2 L nasal cannula oxygen  which is her baseline  - proBNP 4427 (7767 on 03/12/24 at [facility]) - Chest imaging revealed moderate right-sided pleural effusion, could not tap today per IR  - Echo from 12/25 showed 50-55%, was 45% in 10/2023 at outside facility , mild AS, mod to severe TR  - Etiology: likely PPM induced cardiomyopathy +/- tachyarrhythmia  - Clinical exam she is not overt hypervolemic today  - On PTA Lasix  40 mg twice daily, compliant, recommend trial IV Lasix  60mg  x1 and monitor UOP and renal index  - GDMT: PTA Jardiance  10 mg daily, continue  - advised to return to [facility] cardiology for further workup for tricuspid valve clip and CRT upgrade   Permanent atrial fibrillation - History of failed ablation and intolerance to amiodarone  - Rate controlled with metoprolol  and diltiazem  historically, appears no longer on this - Anticoagulated with Coumadin  historically, can continue - AV nodal ablation was done 12/2023 [facility], remains in A fib, will interrogate PPM today, last interrogation 02/2024 showed 100% burden  - aim for rate control, currently not requiring AVN blocking agents   Sick sinus syndrome s/p Medtronic PPM 03/2014 at [facility] - CRT upgrade was suggested by [facility] EP team, she was planned for repeat venogram and cardiac catheterization given known left sided vein stenosis, this has not been done  Nonocclusive CAD - Continue PTA Crestor  10 mg daily  HTN CKD  IIIa COPD OSA Debility  Hx of CVA  - per primary team   Risk Assessment/Risk Scores:   New [place]  Heart Association (NYHA) Functional Class NYHA Class III  CHA2DS2-VASc Score = 8  This indicates a 10.8% annual risk of stroke. The patient's score is based upon: CHF History: 1 HTN History: 1 Diabetes History: 0 Stroke History: 2 Vascular Disease History: 1 Age Score: 2 Gender Score: 1     For questions or updates, please contact [facility] Please consult  [email or web address] for contact info under      Signed, Emeline FORBES Calender, DO  03/18/2024 2:51 PM  Patient seen and examined, note reviewed with the signed Advanced Practice Provider. I personally reviewed laboratory data, imaging studies and relevant notes. I independently examined the patient and formulated the important aspects of the plan. I have personally discussed the plan with the patient and/or family. Comments or changes to the note/plan are indicated below.  Patient profile: This is a 89 year old female who follows at [facility] for cardiac care with a complicated past medical history of permanent atrial fibrillation on coumadin  and post ablation (01/04/24), SSS s/p Medtronic dual chamber (02/2015), HFimpEF, AS, severe TR (being worked up for TEER), left subclavian and axillary vein stenosis, non obstructive CAD, HTN, HLD, orthostatic hypotension, OSA, CKD III, COPD, CVA, pleural effusion s/p thoracentesis last week who presents to [facility] following a fall at her ALF. She was found to have an elevated pro BNP prompting cardiology evaluation.   Currently, she states she has been having shortness of breath ever since she got the pacemaker placed and it has not been changing much recently. Has not noted any weight gain, infact has had some weight loss. No worsening lower extremity swelling or any other acutely worsening complaints.    My Exam:  Physical Exam Vitals and nursing note reviewed.  Constitutional:      Appearance:  Normal appearance.  HENT:     Head: Normocephalic and atraumatic.  Eyes:     Conjunctiva/sclera: Conjunctivae normal.  Neck:     Vascular: Hepatojugular reflux and JVD present.  Cardiovascular:     Rate and Rhythm: Normal rate.     Heart sounds: Murmur heard.     Systolic murmur is present with a grade of 3/6.  Pulmonary:     Effort: Pulmonary effort is normal.     Breath sounds: Rales present.     Comments: Decreased breath sounds on right, bibasilar rales Musculoskeletal:        General: No swelling or tenderness.  Skin:    Coloration: Skin is not jaundiced or pale.  Neurological:     Mental Status: She is alert.     Telemetry: paced rhythm with PVCs - Personally reviewed  Assessment & Plan:  Chronic hypoxic respiratory failure Acute on chronic HFimpEF - will give IV lasix  today as she does appear volume overloaded on exam. proBNP is actually lower than her recent study at [facility].  Moderate to severe TR - She follows with [facility] for cardiac care and was seen yesterday with plan for outpatient [facility]  Permanent atrial fibrillation Nonischemic cardiomyopathy SSS s/p Medtronic DC PPM Pleural effusion - pulm on board   Signed, Emeline Calender, DO [place]  [facility]  03/18/2024 2:51 PM        [1]  Allergies Allergen Reactions   Ace Inhibitors Cough   Ventolin  [Albuterol ] Palpitations and Other (See Comments)    Triggers A-Fib    Oysters [Shellfish Allergy] Nausea And Vomiting   Sulfa Antibiotics Rash   "

## 2024-03-18 NOTE — ED Notes (Signed)
 Pt given crackers and water  to drink.  Pt repositioned in the bed

## 2024-03-18 NOTE — ED Notes (Signed)
 Page sent to [facility] DO

## 2024-03-18 NOTE — ED Notes (Signed)
 Secure chat sent to [facility] DO regarding pt's SOB

## 2024-03-18 NOTE — Progress Notes (Addendum)
 Patient with R moderate pleural effusion with request for diagnostic and therapeutic thora. Pre-procedural US  imaging absolutely supports moderate amount of fluid; however, lung flap present with borderline window for puncture. Discussed increased risk with patient. Recommend bringing patient back down tomorrow to see if any increase in fluid improved window for puncture given labs previously drawn at puncture by [facility] within the last week. Patient's work of breathing further complicates ability to maintain borderline window during movement. Made plan for patient to return to IR tomorrow and see if fluid increased enough to improve puncture window for safer puncture given has been rapidly increasing since last puncture.   Please see imaging section of Epic for full dictation.  Makeyla Govan NP 03/18/2024 1:11 PM

## 2024-03-18 NOTE — Progress Notes (Signed)
 Orthopedic Tech Progress Note Patient Details:  Lynn Dunn [birth year] [id number]  Patient ID: [name], female   DOB: Jul 25, 1930, 89 y.o.   MRN: [id number] I attended trauma page. Chandra Dorn PARAS 03/18/2024, 4:47 AM

## 2024-03-18 NOTE — H&P (Signed)
 Consult Requesting Physician:Lori LELON Dimes, PA-C   No chief complaint on file.     HPI: Patient presents to the clinic today for consideration of advanced therapies for valvular heart disease.  She is a 89 year old white female with a history of atrial fibrillation status post ablation and permanent pacemaker as well as mild aortic stenosis and severe tricuspid regurgitation.  She is maintained on chronic anticoagulation in the form of warfarin.   She was recently hospitalized with a heart failure exacerbation and pleural effusion.  She had a thoracentesis and was diuresed and improved.   She had an echocardiogram in December that I personally reviewed which showed mild aortic stenosis.  Pulmonary artery pressures were 59.  She has at least 3-4+ tricuspid regurgitation-query pacemaker induced-.   In the past, she was followed by the [facility] cardiology system.  She is now here for another opinion regarding the possibility of tricuspid clip.   She does describe generalized fatigue, shortness of breath, dyspnea on exertion.  She lives at a retirement community although still ambulates and has her mental faculties about her.   Her weight has been stable since discharge from the hospital on an SGLT2 inhibitor.  Current weight is 163 pounds.  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  She states her legs are normally fat.   Closest relative is her sister who lives in [place].   Current Rx           Medication Sig Dispense Refill   calcitRIOL (ROCALTROL) 0.25 mcg capsule Take one capsule (0.25 mcg dose) by mouth daily.       cholecalciferol  1,000 units (25 mcg) tablet Take one tablet (1,000 Units dose) by mouth daily.       furosemide  (LASIX ) 40 mg tablet Take one tablet (40 mg dose) by mouth 2 (two) times daily.       HYDROcodone -homatropine (HYCODAN,HYDROMET) 5-1.5 mg/5 mL solution Take 5 mLs by mouth 2 (two) times a day as needed. Max Daily Amount: 10 mLs 120 mL 0    JARDIANCE  10 mg TABS tablet Take one tablet (10 mg dose) by mouth daily.       Magnesium  100 MG CAPS Take 1 capsule by mouth daily. Sulfate       meclizine HCl (ANTIVERT) 25 mg tablet Take one tablet (25 mg dose) by mouth every 12 (twelve) hours as needed for Dizziness.       mupirocin (BACTROBAN) 2 % ointment one Application by Nasal route 2 (two) times daily.       ondansetron  (ZOFRAN -ODT) 4 mg disintegrating tablet Take one tablet (4 mg dose) by mouth every 8 (eight) hours as needed for Nausea.       OZEMPIC, 0.25 OR 0.5 MG/DOSE, 2 MG/3ML SOPN pen injection Inject 0.25 mg into the skin once a week. Sunday       potassium chloride  10 mEq CR tablet Take one tablet (10 mEq dose) by mouth daily.       rosuvastatin  calcium  (CRESTOR ) 10 mg tablet Take one tablet (10 mg dose) by mouth daily.       traMADol  (ULTRAM ) 50 mg tablet Take one tablet (50 mg dose) by mouth 3 (three) times a day as needed for up to 3 days. Max Daily Amount: 150 mg 6 tablet 0   TURMERIC PO Take 500 mg by mouth 2 (two) times daily.       Warfarin Sodium  (COUMADIN  PO) Take 3 mg by mouth daily.  No current facility-administered medications for this visit.        Allergies: Ace inhibitors, Albuterol , Shellfish allergy, and Sulfa antibiotics   Past Medical History      Past Medical History:  Diagnosis Date   CVA (cerebral vascular accident) (*) 2009   Hyperlipemia     Hypertension        Past Surgical History       Past Surgical History:  Procedure Laterality Date   Knee arthroscopy Bilateral 2015      [Social History]  [Social History]      Socioeconomic History   Marital status: Widowed  Tobacco Use   Smoking status: Never      Passive exposure: Never   Smokeless tobacco: Never  Vaping Use   Vaping status: Never Used  Substance and Sexual Activity   Alcohol use: No   Drug use: No   [Family History]  [Family History] No family history on file.   ROS:The patient denies  nausea, vomiting, diarrhea, fever, chills, or bleeding. All other systems are reviewed and are negative except for that mentioned in the history of present illness.   OBJECTIVE:   BP 136/78 (BP Location: Left Upper Arm, Patient Position: Sitting)   Pulse 92   Ht 5' (1.524 m)   Wt 163 lb 12.8 oz (74.3 kg)   SpO2 100%   BMI 31.99 kg/m    Physical Exam:The patient is an elderly white female in no distress. Pupils equal round reactive light and extraocular movements intact. No thyromegaly.NO lympadenopathy. No JVD. No carotid bruits. Cardiovascular exam reveals RRR.  2/6 systolic murmur at the left lower sternal border. No rub. No gallop. Lungs auscultation: Clear bilaterally.  Extremities: 1+ bilateral lower extremity edema. No cyanosis.Skin reveals no rashes. Neurological exam reveals cranial nerves are intact with no focal deficits. Psychiatric exam reveals the patient is alert and oriented to person place and time.        Lab Results  Component Value Date/Time    INR 1.5 03/14/2024 01:15 AM    INR 1.5 03/13/2024 02:11 AM    INR 1.5 03/12/2024 03:35 PM         Lab Results  Component Value Date    HGB 11.5 03/14/2024         Lab Results  Component Value Date    Creatinine 1.50 (H) 03/14/2024      Assessment: 1. Pacemaker   2. Pulmonary hypertension (*)   3. Heart failure with preserved ejection fraction (HFpEF), unspecified HF chronicity (*)   4. Warfarin anticoagulation       Plan: Patient with moderately severe to severe tricuspid regurgitation of unclear etiology.  She does have a pacemaker lead across her tricuspid valve.  She is interested in pursuing further workup and evaluation for consideration of tricuspid clip.  I do think she is symptomatic and perhaps her symptoms could be helped with reducing her tricuspid regurgitation.  We will plan on transesophageal echocardiogram with a tricuspid imager followed by right and left heart catheterization with me.  This will be  coordinated through our clinic coordinator, Randall.   A Shared Decision Making process with the patient took place. The procedure Tri clip was discussed. Risk, benefits and alternatives of medication management were reviewed. The patient was given ample opportunity to ask questions which were all addressed. The patient agrees with the above listed plan.    I spent 63 minutes in total face-to-face and non-face-to-face time on this visit with >50% spent face-to-face  counseling/coordinating care and providing education.  I reviewed laboratory results and counseled the patient on medication management.    Lamar KATHEE Muir, MD

## 2024-03-18 NOTE — H&P (Addendum)
 " History and Physical    Lynn Dunn FMW:[id number] DOB: 02/08/1931 DOA: 03/18/2024  PCP: Melchor Ozell PARAS, MD (Inactive)   Patient coming from: SNF    Chief Complaint: Fall  HPI: Lynn Dunn is a 89 y.o. female with medical history significant of HFpEF, paroxysmal A-fib status post AV nodal ablation status post pacemaker placement, on anticoagulation with warfarin, CKD stage IIIb, CVA, obesity who presented from [facility] facility after she fell.  She was getting up from the bed to use the restroom, she lost balance and then fell.  She hit her head and both knees.  No history of loss of consciousness.  She bruised her left forehead On presentation, she was hemodynamically stable.  She was alert and oriented.  She complained of mild shortness of breath.  Put on 2 L of oxygen  per minute.  She uses oxygen  at retirement living facility. She follows with cardiology at [facility].  She was recently admitted and was found to have right-sided pleural effusion.  Hide underwent thoracentesis.  She ambulates with the help of cane, rollator. Patient seen and examined at bedside this morning.  Sister was at bedside.  She remained overall comfortable.  Denied worsening shortness of breath or cough.  Denied fever, chills, abdominal pain, nausea, vomiting, diarrhea, hematochezia, melena or dysuria.   ED Course: Remained hemodynamically stable.  On 2 L of oxygen  per minute.  Lab work showed creatinine of 1.3.  proBNP of 4427.  INR of 2.6.  Chest x-ray showed right-sided pleural effusion with underlying atelectasis versus infiltrate.  CT chest showed moderate right-sided pleural effusion with compressive atelectasis of the right lung.  Also showed subtle hazy centrilobular attenuation over the right upper lobe which could be seen with an infectious process versus atypical infectious/inflammatory process.  Cardiology, pulmonology consulted.  Review of Systems: As per HPI otherwise 10 point review of  systems negative.    Past Medical History:  Diagnosis Date   Airway malacia    Anemia    Anginal pain 2011   had to take nitro    Arthritis    COPD (chronic obstructive pulmonary disease) (HCC)    Dysuria    GERD (gastroesophageal reflux disease)    Hematuria    HTN (hypertension)    Hx of echocardiogram    Echo (12/15):  Mild LVH, EF 60-65%, mild MR, mod LAE (LA 49 mm), PASP 43 mmHg   Obesity    OSA (obstructive sleep apnea)    cpap at home   PAF (paroxysmal atrial fibrillation) (HCC)    s/p ablation 07/29/09   Sick sinus syndrome (HCC)    pauses with syncope   Stroke (HCC) 6/09   NO RESIDUAL PROBLEMS    Past Surgical History:  Procedure Laterality Date   ABDOMINAL HYSTERECTOMY     ATRIAL ABLATION SURGERY     s/p ablation for afib 07/29/09   CYSTOSCOPY N/A 06/23/2015   Procedure: CYSTOSCOPY;  Surgeon: Ricardo Likens, MD;  Location: [facility];  Service: Urology;  Laterality: N/A;   EP IMPLANTABLE DEVICE N/A 02/17/2015   MDT Advisa MRI PPM implanted by Dr Waddell for sick sinus and syncope   EP IMPLANTABLE DEVICE N/A 02/17/2015   Procedure: Loop Recorder Removal;  Surgeon: Danelle LELON Waddell, MD;  Location: [facility] INVASIVE CV LAB;  Service: Cardiovascular;  Laterality: N/A;   ESOPHAGOGASTRODUODENOSCOPY  01/24/2012   Procedure: ESOPHAGOGASTRODUODENOSCOPY (EGD);  Surgeon: Oliva FORBES Boots, MD;  Location: [facility] ENDOSCOPY;  Service: Endoscopy;  Laterality: N/A;  KNEE ARTHROSCOPY     BILATERAL   LOOP RECORDER IMPLANT N/A 03/12/2014   Procedure: LOOP RECORDER IMPLANT;  Surgeon: Lynwood Rakers, MD;  Location: [facility] CATH LAB;  Service: Cardiovascular;  Laterality: N/A;   LYSIS OF ADHESION N/A 06/23/2015   Procedure: LYSIS OF LABIAL ADHESION;  Surgeon: Ricardo Likens, MD;  Location: [facility];  Service: Urology;  Laterality: N/A;   VIDEO BRONCHOSCOPY Bilateral 12/15/2016   Procedure: VIDEO BRONCHOSCOPY WITHOUT FLUORO;  Surgeon: Shelah Lamar RAMAN, MD;  Location: [facility] ENDOSCOPY;  Service: Cardiopulmonary;   Laterality: Bilateral;     reports that she has never smoked. She has never used smokeless tobacco. She reports current alcohol use of about 2.0 standard drinks of alcohol per week. She reports that she does not use drugs.  Allergies[1]  Family History  Problem Relation Age of Onset   Breast cancer Mother    Brain cancer Father    Breast cancer Other    Brain cancer Other    Stroke Maternal Aunt    Hypertension Sister    Heart attack Neg Hx      Prior to Admission medications  Medication Sig Start Date End Date Taking? Authorizing Provider  calcitRIOL (ROCALTROL) 0.25 MCG capsule Take 0.25 mcg by mouth daily.  05/07/17   [provider]  Coenzyme Q10 (COQ10 PO) Take 1 capsule by mouth at bedtime.     [provider]  diltiazem  (CARDIZEM  CD) 120 MG 24 hr capsule TAKE 1 CAPSULE EVERY DAY (KEEP MARCH APPOINTMENT FOR REFILLS) 05/02/21   Dann Candyce RAMAN, MD  estradiol  (ESTRACE  VAGINAL) 0.1 MG/GM vaginal cream Apply pea-sized amount to vagina / urethra 2-3 x weekly to prevent labial adhesions 06/23/15   Manny, Ricardo KATHEE Raddle., MD  HYDROcodone -acetaminophen  (NORCO/VICODIN) 5-325 MG tablet Take 1 tablet by mouth as needed for pain. 01/09/20   [provider]  levothyroxine  (SYNTHROID , LEVOTHROID) 50 MCG tablet Take 50 mcg by mouth daily before breakfast.  05/13/18   [provider]  Magnesium  100 MG CAPS Take 100 mg by mouth 2 (two) times daily.     [provider]  metoprolol  tartrate (LOPRESSOR ) 25 MG tablet Taking 4 tablets by mouth twice daily 12/01/19   [provider]  predniSONE  5 MG TBEC Take 5 mg by mouth daily. 06/23/19   Shelah Lamar RAMAN, MD  rosuvastatin  (CRESTOR ) 10 MG tablet Take 10 mg by mouth at bedtime.  09/14/16   [provider]  torsemide  (DEMADEX ) 20 MG tablet TAKE 1 TABLET TWICE DAILY 03/28/21   Varanasi, Jayadeep S, MD  warfarin (COUMADIN ) 3 MG tablet Take 1.5-3 mg by mouth See admin instructions. Take 1.5 mg by  mouth in the evening on Sun/Wed and 3 mg on Mon/Tues/Thurs/Fri/Sat    [provider]    Physical Exam: Vitals:   03/18/24 0800 03/18/24 0900 03/18/24 1115 03/18/24 1130  BP: (!) 163/99 (!) 178/89 (!) 162/97   Pulse: 75 70 (!) 25 77  Resp:   (!) 21 (!) 23  Temp:      TempSrc:      SpO2: 100% 99% 92% 100%  Weight:      Height:        Constitutional: Overall comfortable, lying in bed, pleasant elderly female Vitals:   03/18/24 0800 03/18/24 0900 03/18/24 1115 03/18/24 1130  BP: (!) 163/99 (!) 178/89 (!) 162/97   Pulse: 75 70 (!) 25 77  Resp:   (!) 21 (!) 23  Temp:      TempSrc:  SpO2: 100% 99% 92% 100%  Weight:      Height:       Eyes: PERRL, lids and conjunctivae normal, bruise on the left forehead ENMT: Mucous membranes are moist.  Neck: normal, supple, no masses, no thyromegaly Respiratory: Diminished air sounds on the right side,, no wheezing, no crackles. Normal respiratory effort. No accessory muscle use.  Cardiovascular: Regular rate and rhythm, no murmurs / rubs / gallops. No extremity edema.  Abdomen: no tenderness, no masses palpated. No hepatosplenomegaly. Bowel sounds positive.  Musculoskeletal: no clubbing / cyanosis. No joint deformity upper and lower extremities.  Skin: no rashes, lesions, ulcers. No induration Neurologic: CN 2-12 grossly intact.  Strength 5/5 in all 4.  Psychiatric: Normal judgment and insight. Alert and oriented x 3. Normal mood.   Foley Catheter:None  Labs on Admission: I have personally reviewed following labs and imaging studies  CBC: Recent Labs  Lab 03/18/24 0342 03/18/24 0346  WBC 10.2  --   NEUTROABS 7.3  --   HGB 12.5 13.9  HCT 40.3 41.0  MCV 93.1  --   PLT 214  --    Basic Metabolic Panel: Recent Labs  Lab 03/18/24 0342 03/18/24 0346  NA 141 141  K 3.9 3.7  CL 102 98  CO2 29  --   GLUCOSE 125* 124*  BUN 28* 30*  CREATININE 1.34* 1.50*  CALCIUM  9.1  --    GFR: Estimated Creatinine Clearance:  21.9 mL/min (A) (by C-G formula based on SCr of 1.5 mg/dL (H)). Liver Function Tests: No results for input(s): AST, ALT, ALKPHOS, BILITOT, PROT, ALBUMIN in the last 168 hours. No results for input(s): LIPASE, AMYLASE in the last 168 hours. No results for input(s): AMMONIA in the last 168 hours. Coagulation Profile: Recent Labs  Lab 03/18/24 0342  INR 2.6*   Cardiac Enzymes: No results for input(s): CKTOTAL, CKMB, CKMBINDEX, TROPONINI in the last 168 hours. BNP (last 3 results) Recent Labs    03/18/24 0436  PROBNP 4,427.0*   HbA1C: No results for input(s): HGBA1C in the last 72 hours. CBG: No results for input(s): GLUCAP in the last 168 hours. Lipid Profile: No results for input(s): CHOL, HDL, LDLCALC, TRIG, CHOLHDL, LDLDIRECT in the last 72 hours. Thyroid  Function Tests: No results for input(s): TSH, T4TOTAL, FREET4, T3FREE, THYROIDAB in the last 72 hours. Anemia Panel: No results for input(s): VITAMINB12, FOLATE, FERRITIN, TIBC, IRON, RETICCTPCT in the last 72 hours. Urine analysis:    Component Value Date/Time   COLORURINE YELLOW 01/11/2019 1800   APPEARANCEUR CLEAR 01/11/2019 1800   LABSPEC <1.005 (L) 01/11/2019 1800   PHURINE 7.5 01/11/2019 1800   GLUCOSEU NEGATIVE 01/11/2019 1800   HGBUR NEGATIVE 01/11/2019 1800   BILIRUBINUR NEGATIVE 01/11/2019 1800   KETONESUR NEGATIVE 01/11/2019 1800   PROTEINUR NEGATIVE 01/11/2019 1800   UROBILINOGEN 0.2 05/13/2012 0136   NITRITE NEGATIVE 01/11/2019 1800   LEUKOCYTESUR TRACE (A) 01/11/2019 1800    Radiological Exams on Admission: CT CHEST WO CONTRAST Addendum Date: 03/18/2024 ADDENDUM REPORT: 03/18/2024 10:19 ADDENDUM: Note that there is stable minimal bilateral pleural calcification which may be due to prior trauma versus asbestos related pleural disease. Electronically Signed   By: Toribio Agreste M.D.   On: 03/18/2024 10:19   Result Date: 03/18/2024 CLINICAL  DATA:  Possible pleural effusion. Pneumonia. Shortness of breath. EXAM: CT CHEST WITHOUT CONTRAST TECHNIQUE: Multidetector CT imaging of the chest was performed following the standard protocol without IV contrast. RADIATION DOSE REDUCTION: This exam was performed according to  the departmental dose-optimization program which includes automated exposure control, adjustment of the mA and/or kV according to patient size and/or use of iterative reconstruction technique. COMPARISON:  04/12/2022 FINDINGS: Cardiovascular: Mild stable cardiomegaly. Left-sided cardiac pacemaker is present. Moderate calcified plaque over the mitral valve annulus. Calcified plaque over the left main and 3 vessel atherosclerotic coronary arteries. Ascending thoracic aorta measures 3.9 cm unchanged. There is calcified plaque throughout the thoracic aorta. Pulmonary arterial system is unremarkable on this noncontrast exam. Remaining vascular structures are unchanged. Mediastinum/Nodes: No evidence of mediastinal or hilar adenopathy on this noncontrast exam. Remaining mediastinal structures are unremarkable. Lungs/Pleura: Lungs are adequately inflated. There is a new moderate size right pleural effusion with associated compressive atelectasis in the right base. Mild scarring and atelectasis over the right middle lobe. There is subtle hazy centrilobular attenuation over the right upper lobe which could be seen with an infectious process versus atypical infectious/inflammatory process. Minimal linear scarring over the lingula/left upper lobe. Airways are unremarkable. Upper Abdomen: Images through the upper abdomen demonstrate mild nodular contour of the liver which can be seen with cirrhosis. Calcified plaque over the abdominal aorta which is normal in caliber. Diverticulosis of the colon. Musculoskeletal: Moderate degenerative changes of the spine. IMPRESSION: 1. New moderate size right pleural effusion with associated compressive atelectasis in  the right base. 2. Subtle hazy centrilobular attenuation over the right upper lobe which could be seen with an infectious process versus atypical infectious/inflammatory process. 3. Stable mild cardiomegaly. Atherosclerotic vascular disease including the left main and 3 vessel coronary arteries. 4. Mild nodular contour of the liver which can be seen with cirrhosis. 5. Diverticulosis of the colon. 6. Aortic atherosclerosis. Aortic Atherosclerosis (ICD10-I70.0). Electronically Signed: By: Toribio Agreste M.D. On: 03/18/2024 09:14   CT Cervical Spine Wo Contrast Result Date: 03/18/2024 EXAM: CT CERVICAL SPINE WITHOUT CONTRAST 03/18/2024 03:57:52 AM TECHNIQUE: CT of the cervical spine was performed without the administration of intravenous contrast. Multiplanar reformatted images are provided for review. Automated exposure control, iterative reconstruction, and/or weight based adjustment of the mA/kV was utilized to reduce the radiation dose to as low as reasonably achievable. COMPARISON: CT of the cervical spine dated 04/12/2022. CLINICAL HISTORY: Polytrauma, blunt. FINDINGS: BONES AND ALIGNMENT: Mild straightening of the normal cervical lordosis, as before. No acute fracture or traumatic malalignment. DEGENERATIVE CHANGES: Diffuse chronic degenerative disc disease and facet arthrosis throughout the cervical spine. SOFT TISSUES: No prevertebral soft tissue swelling. There is extensive calcific atheromatous disease, particularly involving the carotid bulbs. LUNGS: There is moderate right-sided pleural effusion. IMPRESSION: 1. No evidence of acute traumatic injury. 2. Mild straightening of the normal cervical lordosis, as before. 3. Diffuse chronic degenerative disc disease and facet arthrosis throughout the cervical spine. 4. Extensive calcific atheromatous disease, particularly involving the carotid bulbs. 5. Moderate right pleural effusion. Electronically signed by: Evalene Coho MD MD 03/18/2024 04:17 AM EST RP  Workstation: [id number]   CT Head Wo Contrast Result Date: 03/18/2024 EXAM: CT HEAD WITHOUT CONTRAST 03/18/2024 03:57:52 AM TECHNIQUE: CT of the head was performed without the administration of intravenous contrast. Automated exposure control, iterative reconstruction, and/or weight based adjustment of the mA/kV was utilized to reduce the radiation dose to as low as reasonably achievable. COMPARISON: 10/24/2023 CLINICAL HISTORY: Polytrauma, blunt. FINDINGS: BRAIN AND VENTRICLES: No acute hemorrhage. No evidence of acute infarct. Age-related cerebral volume loss. Moderate periventricular and deep cerebral white matter disease. Chronic lacunar infarct within the left internal capsule. No hydrocephalus. No extra-axial collection. No mass effect or midline shift.  ORBITS: Bilateral lens replacement. SINUSES: No acute abnormality. SOFT TISSUES AND SKULL: Left frontal scalp hematoma. No skull fracture. VASCULATURE: Moderate calcific atheromatous disease within the carotid siphons and vertebral arteries. IMPRESSION: 1. No acute intracranial hemorrhage, mass effect, or acute territorial infarct related to polytrauma. 2. Left frontal scalp hematoma. 3. Chronic microvascular ischemic changes with chronic lacunar infarct in the left internal capsule and age-related cerebral volume loss. Electronically signed by: Evalene Coho MD MD 03/18/2024 04:13 AM EST RP Workstation: [id number]   DG Chest Portable 1 View Result Date: 03/18/2024 EXAM: 1 VIEW(S) XRAY OF THE CHEST 03/18/2024 03:55:16 AM COMPARISON: 08/11/2022 CLINICAL HISTORY: fallonthinners FINDINGS: LINES, TUBES AND DEVICES: Left chest cardiac device. LUNGS AND PLEURA: Mild vascular congestion is noted. New right-sided pleural effusion is noted with underlying atelectasis/infiltrate. Left lung remains clear. No pneumothorax. HEART AND MEDIASTINUM: Cardiomegaly. Aortic atherosclerosis. BONES AND SOFT TISSUES: No acute osseous abnormality. IMPRESSION: 1. New  right-sided pleural effusion with underlying atelectasis or infiltrate. Electronically signed by: Oneil Devonshire MD MD 03/18/2024 04:00 AM EST RP Workstation: [id number]     Assessment/Plan Principal Problem:   Recurrent right pleural effusion Active Problems:   Atrial fibrillation (HCC)   SHORTNESS OF BREATH (SOB)   History of CVA (cerebrovascular accident)   Mixed hyperlipidemia   Obesity   Acute hypoxemic respiratory failure (HCC)   Recurrent right-sided pleural effusion: She was recently discharged from [facility] after the management for the same.  She presented there with dyspnea, found to have right pleural effusion.  Effusion suspected to be from underlying CHF, valvular heart disease.  At that time CT imaging of the chest was obtained which showed large right-sided pleural effusion with compressive atelectasis of the right lower lobe, no definitive infiltrate, dilatation of pulmonary artery.  She underwent thoracentesis by IR.  Fluid was suggestive of transudative in nature. CT done here shows reaccumulation of fluid.  Planning for ultrasound guided thoracentesis.  PCCM also consulted for recurrent pleural effusion.  Will follow-up pleural fluid analysis  Fall: Presented from retirement living facility.  CT head showed left frontal scalp hematoma,chronic microvascular ischemic changes with chronic lacunar infarct in the left internal capsule and age-related cerebral volume loss.  CT cervical spine did not show any acute findings.  Will consult PT/OT.  Ambulates with the help of cane, rollator  Community-acquired pneumonia?: Questionable.  Chest CT showed subtle hazy centrilobular attenuation over the right upper lobe which could be seen with an infectious process versus atypical infectious/inflammatory process.  Do not think this is contributing to her pneumonia.  Will continue Augmentin  for now.  Chronic hypoxic respiratory failure: On 2 to 3 L of oxygen   at her facility.  Currently on the same.  Paroxysmal A-fib: History of AV nodal ablation.  Follows with cardiology.  On warfarin for anticoagulation.  Status post pacemaker placement History of severe tricuspid valve disease, pulmonary hypertension, moderate MVR.  Remains in normal sinus rhythm  History of HFpEF: Presented with pleural effusion.  Elevated BNP.  Will consult cardiology.  Recent echocardiogram on 02/14/2024 showed EF of 50 to 55%, normal wall motion, moderate to severe tricuspid valve regurgitation.  Takes Jardiance , Lasix .  Cardiology consulted here.  She follows with [facility] cardiology.  Lasix  resumed  Hyperlipidemia: On statin  CKD stage IIIb: Chronic and function at baseline  History of CVA/deconditioning/debility: Lives at a facility.  Continue statin.  Ambulates with the help of cane, rollator      Severity of Illness: The appropriate patient status  for this patient is INPATIENT.   DVT prophylaxis: Eliquis Code Status: Full Family Communication: Sister at bedside Consults called: Cardiology, pulmonology     Lynn Mustache MD [facility]  03/18/2024, 12:53 PM   [email or web address]      [1]  Allergies Allergen Reactions   Ace Inhibitors Cough   Ventolin  [Albuterol ] Palpitations and Other (See Comments)    Triggers A-Fib    Oysters [Shellfish Allergy] Nausea And Vomiting   Sulfa Antibiotics Rash   "

## 2024-03-18 NOTE — ED Notes (Signed)
 Patient seated upright in bed, eating lunch. No further needs expressed.

## 2024-03-18 NOTE — Consult Note (Signed)
 "  NAME:  Lynn Dunn, MRN:  [id number], DOB:  1931/02/01, LOS: 0 ADMISSION DATE:  03/18/2024, CONSULTATION DATE:  1/13 REFERRING MD:  Dr. Jillian, CHIEF COMPLAINT:  recurrent pleural effusion   History of Present Illness:  Patient is a 89 yo F w/ pertinent PMH afib on coumadin , sick sinus syndrome s/p PPM, HFimpEF, aortic stenosis, TVR, CAD, HTN, OSA on CPAP, COPD on 2L [place] at home, CKD 3 presents to [facility] on 1/13 w/ recurrent pleural effusion.  Patient recently admitted at [facility] 1/7-1/9 for transudative pleural effusion. Underwent thoracentesis 730 cc removed and given lasix .   Patient presents to [facility] on 1/13 after fall, hitting head. Denies chest pain. Having some sob. On arrival patient placed on 2L [place]. CT head no acute abnormality; left frontal scalp hematoma noted. CXR showing right sided pleural effusion w/ possible underlying atelectasis vs infiltrate. CT chest revealed new moderate size right pleural effusion with associated compressive atelectasis, subtle hazy centrilobular attenuation over the right upper lobe which could be seen with infectious process versus atypical infection/inflammation. Afebrile and wbc 10.2. BNP 4,427. Started on abx. Patient taken to IR for potential thoracentesis but not great windows visualized. Cardiology and pccm consulted.   Pertinent  Medical History   Past Medical History:  Diagnosis Date   Airway malacia    Anemia    Anginal pain 2011   had to take nitro    Arthritis    COPD (chronic obstructive pulmonary disease) (HCC)    Dysuria    GERD (gastroesophageal reflux disease)    Hematuria    HTN (hypertension)    Hx of echocardiogram    Echo (12/15):  Mild LVH, EF 60-65%, mild MR, mod LAE (LA 49 mm), PASP 43 mmHg   Obesity    OSA (obstructive sleep apnea)    cpap at home   PAF (paroxysmal atrial fibrillation) (HCC)    s/p ablation 07/29/09   Sick sinus syndrome (HCC)    pauses with syncope   Stroke (HCC) 6/09   NO RESIDUAL PROBLEMS      Significant Hospital Events: Including procedures, antibiotic start and stop dates in addition to other pertinent events   1/13 admit after fall; recurrent pleural effusion  Interim History / Subjective:  See above  Objective    Blood pressure (!) 162/97, pulse 77, temperature 97.8 F (36.6 C), temperature source Oral, resp. rate (!) 23, height 5' (1.524 m), weight 73.9 kg, SpO2 100%.        Intake/Output Summary (Last 24 hours) at 03/18/2024 1314 Last data filed at 03/18/2024 0340 Gross per 24 hour  Intake 0 ml  Output --  Net 0 ml   Filed Weights   03/18/24 0341 03/18/24 0400  Weight: 73.9 kg 73.9 kg    Examination: General:  chronically elderly ill appearing female in NAD HEENT: MM pink/moist; [place] in place Neuro: Aox3; MAE CV: s1s2, RRR, no m/r/g PULM:  dim clear BS bilaterally; [place] 2L GI: soft, bsx4 active  Extremities: warm/dry, ble edema     Resolved problem list   Assessment and Plan   Recurrent right pleural effusion Questionable CAP Chronic respiratory failure w/ hypoxia: on 2L [place] at home OSA: patient quit wearing cpap 2 years ago COPD?: not on any inhalers PAF on warfarin HFpEF Plan: -IR attempted thoracentesis on 1/13 but not great view windows; cxr tomorrow am; consider thora tomorrow. If able send pleural fluid for analysis -lasix  per primary -trend cxr -was started on augmentin ; ok to  continue for now although likely transudative. Follow cultures.  -cont [place] and wean for sats >92% -pulm toiletry -prn duoneb  Rest per primary  Labs   CBC: Recent Labs  Lab 03/18/24 0342 03/18/24 0346  WBC 10.2  --   NEUTROABS 7.3  --   HGB 12.5 13.9  HCT 40.3 41.0  MCV 93.1  --   PLT 214  --     Basic Metabolic Panel: Recent Labs  Lab 03/18/24 0342 03/18/24 0346  NA 141 141  K 3.9 3.7  CL 102 98  CO2 29  --   GLUCOSE 125* 124*  BUN 28* 30*  CREATININE 1.34* 1.50*  CALCIUM  9.1  --    GFR: Estimated Creatinine Clearance: 21.9 mL/min  (A) (by C-G formula based on SCr of 1.5 mg/dL (H)). Recent Labs  Lab 03/18/24 0342  WBC 10.2    Liver Function Tests: No results for input(s): AST, ALT, ALKPHOS, BILITOT, PROT, ALBUMIN in the last 168 hours. No results for input(s): LIPASE, AMYLASE in the last 168 hours. No results for input(s): AMMONIA in the last 168 hours.  ABG    Component Value Date/Time   PHART 7.433 (H) 09/01/2009 1740   PCO2ART 35.4 09/01/2009 1740   PO2ART 80.6 09/01/2009 1740   HCO3 23.3 09/01/2009 1740   TCO2 30 03/18/2024 0346   ACIDBASEDEF 0.4 09/01/2009 1740   O2SAT 96.7 09/01/2009 1740     Coagulation Profile: Recent Labs  Lab 03/18/24 0342  INR 2.6*    Cardiac Enzymes: No results for input(s): CKTOTAL, CKMB, CKMBINDEX, TROPONINI in the last 168 hours.  HbA1C: Hgb A1c MFr Bld  Date/Time Value Ref Range Status  09/06/2007 05:00 AM (H)  Final   6.2 (NOTE)   The ADA recommends the following therapeutic goal for glycemic   control related to Hgb A1C measurement:   Goal of Therapy:   < 7.0% Hgb A1C   Reference: American Diabetes Association: Clinical Practice   Recommendations 2008, Diabetes Care,  2008, 31:(Suppl 1).  09/05/2007 09:30 AM (H)  Final   6.2 (NOTE)   The ADA recommends the following therapeutic goal for glycemic   control related to Hgb A1C measurement:   Goal of Therapy:   < 7.0% Hgb A1C   Reference: American Diabetes Association: Clinical Practice   Recommendations 2008, Diabetes Care,  2008, 31:(Suppl 1).    CBG: No results for input(s): GLUCAP in the last 168 hours.  Review of Systems:   Review of Systems  Constitutional:  Negative for fever.  Respiratory:  Positive for shortness of breath.   Cardiovascular:  Negative for chest pain.  Gastrointestinal:  Negative for abdominal pain, nausea and vomiting.     Past Medical History:  She,  has a past medical history of Airway malacia, Anemia, Anginal pain (2011), Arthritis, COPD (chronic  obstructive pulmonary disease) (HCC), Dysuria, GERD (gastroesophageal reflux disease), Hematuria, HTN (hypertension), echocardiogram, Obesity, OSA (obstructive sleep apnea), PAF (paroxysmal atrial fibrillation) (HCC), Sick sinus syndrome (HCC), and Stroke (HCC) (6/09).   Surgical History:   Past Surgical History:  Procedure Laterality Date   ABDOMINAL HYSTERECTOMY     ATRIAL ABLATION SURGERY     s/p ablation for afib 07/29/09   CYSTOSCOPY N/A 06/23/2015   Procedure: CYSTOSCOPY;  Surgeon: Ricardo Likens, MD;  Location: [facility]S;  Service: Urology;  Laterality: N/A;   EP IMPLANTABLE DEVICE N/A 02/17/2015   MDT Advisa MRI PPM implanted by Dr Waddell for sick sinus and syncope   EP IMPLANTABLE DEVICE  N/A 02/17/2015   Procedure: Loop Recorder Removal;  Surgeon: Danelle LELON [place], MD;  Location: [facility] INVASIVE CV LAB;  Service: Cardiovascular;  Laterality: N/A;   ESOPHAGOGASTRODUODENOSCOPY  01/24/2012   Procedure: ESOPHAGOGASTRODUODENOSCOPY (EGD);  Surgeon: Oliva FORBES Boots, MD;  Location: [facility] ENDOSCOPY;  Service: Endoscopy;  Laterality: N/A;   KNEE ARTHROSCOPY     BILATERAL   LOOP RECORDER IMPLANT N/A 03/12/2014   Procedure: LOOP RECORDER IMPLANT;  Surgeon: Lynwood Rakers, MD;  Location: [facility] CATH LAB;  Service: Cardiovascular;  Laterality: N/A;   LYSIS OF ADHESION N/A 06/23/2015   Procedure: LYSIS OF LABIAL ADHESION;  Surgeon: Ricardo Likens, MD;  Location: [facility];  Service: Urology;  Laterality: N/A;   VIDEO BRONCHOSCOPY Bilateral 12/15/2016   Procedure: VIDEO BRONCHOSCOPY WITHOUT FLUORO;  Surgeon: Shelah Lamar RAMAN, MD;  Location: [facility] ENDOSCOPY;  Service: Cardiopulmonary;  Laterality: Bilateral;     Social History:   reports that she has never smoked. She has never used smokeless tobacco. She reports current alcohol use of about 2.0 standard drinks of alcohol per week. She reports that she does not use drugs.   Family History:  Her family history includes Brain cancer in her father and another family member;  Breast cancer in her mother and another family member; Hypertension in her sister; Stroke in her maternal aunt. There is no history of Heart attack.   Allergies Allergies[1]   Home Medications  Prior to Admission medications  Medication Sig Start Date End Date Taking? Authorizing Provider  calcitRIOL (ROCALTROL) 0.25 MCG capsule Take 0.25 mcg by mouth daily.  05/07/17  Yes [provider]  Cholecalciferol  25 MCG (1000 UT) tablet Take 1,000 Units by mouth daily.   Yes [provider]  empagliflozin  (JARDIANCE ) 10 MG TABS tablet Take 10 mg by mouth daily.   Yes [provider]  furosemide  (LASIX ) 40 MG tablet Take 40 mg by mouth 2 (two) times daily.   Yes [provider]  HYDROcodone  bit-homatropine (HYCODAN) 5-1.5 MG/5ML syrup Take 5 mLs by mouth every 12 (twelve) hours as needed for cough.   Yes [provider]  Magnesium  100 MG CAPS Take 100 mg by mouth daily at 12 noon.   Yes [provider]  meclizine (ANTIVERT) 25 MG tablet Take 25 mg by mouth every 12 (twelve) hours as needed for dizziness.   Yes [provider]  Misc Natural Products (TURMERIC, CURCUMIN, PO) Take 500 mg by mouth 2 (two) times daily.   Yes [provider]  mupirocin ointment (BACTROBAN) 2 % Apply 1 Application topically See admin instructions. Apply  to nasal topically times a day for nasal health for 14 days. 03/15/24 03/29/24 Yes [provider]  ondansetron  (ZOFRAN -ODT) 4 MG disintegrating tablet Take 4 mg by mouth every 8 (eight) hours as needed for nausea or vomiting.   Yes [provider]  polyethylene glycol (MIRALAX  / GLYCOLAX ) 17 g packet Take 17 g by mouth daily.   Yes [provider]  potassium chloride  (KLOR-CON ) 10 MEQ tablet Take 10 mEq by mouth daily.   Yes [provider]  rosuvastatin  (CRESTOR ) 10 MG tablet Take 10 mg by mouth at bedtime.  09/14/16  Yes [provider]  warfarin (COUMADIN ) 3 MG  tablet Take 3 mg by mouth every evening.   Yes [provider]  Semaglutide (OZEMPIC, 0.25 OR 0.5 MG/DOSE, [place]) Inject 0.25 mg into the skin every Sunday. Patient not taking: Reported on 03/18/2024    [provider]  Critical care time: na      RODGER Emilio RIGGERS [facility] 03/18/2024, 1:14 PM  Please see [email or web address] for pager details.  From 7A-7P if no response, please call [phone]. After hours, please call ELink [phone].         [1]  Allergies Allergen Reactions   Ace Inhibitors Cough   Ventolin  [Albuterol ] Palpitations and Other (See Comments)    Triggers A-Fib    Oysters [Shellfish Allergy] Nausea And Vomiting   Sulfa Antibiotics Rash   "

## 2024-03-18 NOTE — ED Provider Notes (Signed)
 " [place] EMERGENCY DEPARTMENT AT [facility] Provider Note   CSN: [id number] Arrival date & time: 03/18/24  9670     Patient presents with: Lynn Dunn   Lynn Dunn is a 89 y.o. female.   The history is provided by the EMS personnel and the patient. The history is limited by the condition of the patient.  Fall This is a new problem. The current episode started less than 1 hour ago. The problem occurs constantly. The problem has been resolved. Pertinent negatives include no chest pain, no abdominal pain and no headaches. Nothing aggravates the symptoms. Nothing relieves the symptoms. She has tried nothing for the symptoms. The treatment provided no relief.  Patient with PAF and sick sinus syndrome presents as a level 2 fall on thinners (coumadin ) with hypoxia.      Past Medical History:  Diagnosis Date   Airway malacia    Anemia    Anginal pain 2011   had to take nitro    Arthritis    COPD (chronic obstructive pulmonary disease) (HCC)    Dysuria    GERD (gastroesophageal reflux disease)    Hematuria    HTN (hypertension)    Hx of echocardiogram    Echo (12/15):  Mild LVH, EF 60-65%, mild MR, mod LAE (LA 49 mm), PASP 43 mmHg   Obesity    OSA (obstructive sleep apnea)    cpap at home   PAF (paroxysmal atrial fibrillation) (HCC)    s/p ablation 07/29/09   Sick sinus syndrome (HCC)    pauses with syncope   Stroke (HCC) 6/09   NO RESIDUAL PROBLEMS     Prior to Admission medications  Medication Sig Start Date End Date Taking? Authorizing Provider  calcitRIOL (ROCALTROL) 0.25 MCG capsule Take 0.25 mcg by mouth daily.  05/07/17   [provider]  Coenzyme Q10 (COQ10 PO) Take 1 capsule by mouth at bedtime.     [provider]  diltiazem  (CARDIZEM  CD) 120 MG 24 hr capsule TAKE 1 CAPSULE EVERY DAY (KEEP MARCH APPOINTMENT FOR REFILLS) 05/02/21   Dann Candyce RAMAN, MD  estradiol  (ESTRACE  VAGINAL) 0.1 MG/GM vaginal cream Apply pea-sized amount to vagina /  urethra 2-3 x weekly to prevent labial adhesions 06/23/15   Manny, Ricardo KATHEE Raddle., MD  HYDROcodone -acetaminophen  (NORCO/VICODIN) 5-325 MG tablet Take 1 tablet by mouth as needed for pain. 01/09/20   [provider]  levothyroxine  (SYNTHROID , LEVOTHROID) 50 MCG tablet Take 50 mcg by mouth daily before breakfast.  05/13/18   [provider]  Magnesium  100 MG CAPS Take 100 mg by mouth 2 (two) times daily.     [provider]  metoprolol  tartrate (LOPRESSOR ) 25 MG tablet Taking 4 tablets by mouth twice daily 12/01/19   [provider]  potassium chloride  SA (K-DUR,KLOR-CON ) 20 MEQ tablet Take 20 mEq by mouth daily.     [provider]  predniSONE  5 MG TBEC Take 5 mg by mouth daily. 06/23/19   Shelah Lamar RAMAN, MD  rosuvastatin  (CRESTOR ) 10 MG tablet Take 10 mg by mouth at bedtime.  09/14/16   [provider]  torsemide  (DEMADEX ) 20 MG tablet TAKE 1 TABLET TWICE DAILY 03/28/21   Varanasi, Jayadeep S, MD  Turmeric 500 MG CAPS Take 500 mg by mouth 2 (two) times daily.    [provider]  warfarin (COUMADIN ) 3 MG tablet Take 1.5-3 mg by mouth See admin instructions. Take 1.5 mg by mouth in the evening on Sun/Wed and 3  mg on Mon/Tues/Thurs/Fri/Sat    [provider]    Allergies: Ace inhibitors, Albuterol , Albuterol  sulfate, Oysters [shellfish allergy], and Sulfa antibiotics    Review of Systems  Constitutional:  Negative for fever.  Cardiovascular:  Negative for chest pain.  Gastrointestinal:  Negative for abdominal pain.  Neurological:  Negative for headaches.  All other systems reviewed and are negative.   Updated Vital Signs BP (!) 158/100   Pulse 71   Temp 97.7 F (36.5 C) (Oral)   Resp 20   Ht 5' (1.524 m)   Wt 73.9 kg   LMP  (LMP Unknown)   SpO2 100%   BMI 31.83 kg/m   Physical Exam Vitals and nursing note reviewed.  Constitutional:      General: She is not in acute distress.    Appearance: She is well-developed.   HENT:     Head: Normocephalic and atraumatic.     Nose: Nose normal.  Eyes:     Pupils: Pupils are equal, round, and reactive to light.  Cardiovascular:     Rate and Rhythm: Normal rate. Rhythm irregular.     Pulses: Normal pulses.     Heart sounds: Normal heart sounds.  Pulmonary:     Effort: Pulmonary effort is normal. Tachypnea present. No respiratory distress.     Breath sounds: Examination of the right-middle field reveals decreased breath sounds. Examination of the right-lower field reveals decreased breath sounds. Decreased breath sounds and rhonchi present.  Abdominal:     General: Bowel sounds are normal. There is no distension.     Palpations: Abdomen is soft.     Tenderness: There is no abdominal tenderness. There is no guarding or rebound.  Musculoskeletal:        General: No deformity. Normal range of motion.     Cervical back: Neck supple.  Skin:    General: Skin is warm and dry.     Capillary Refill: Capillary refill takes less than 2 seconds.     Findings: No erythema or rash.  Neurological:     General: No focal deficit present.     Deep Tendon Reflexes: Reflexes normal.  Psychiatric:        Mood and Affect: Mood normal.        Behavior: Behavior normal.     (all labs ordered are listed, but only abnormal results are displayed) Results for orders placed or performed during the hospital encounter of 03/18/24  CBC with Differential   Collection Time: 03/18/24  3:42 AM  Result Value Ref Range   WBC 10.2 4.0 - 10.5 K/uL   RBC 4.33 3.87 - 5.11 MIL/uL   Hemoglobin 12.5 12.0 - 15.0 g/dL   HCT 59.6 63.9 - 53.9 %   MCV 93.1 80.0 - 100.0 fL   MCH 28.9 26.0 - 34.0 pg   MCHC 31.0 30.0 - 36.0 g/dL   RDW 82.7 (H) 88.4 - 84.4 %   Platelets 214 150 - 400 K/uL   nRBC 0.0 0.0 - 0.2 %   Neutrophils Relative % 72 %   Neutro Abs 7.3 1.7 - 7.7 K/uL   Lymphocytes Relative 17 %   Lymphs Abs 1.7 0.7 - 4.0 K/uL   Monocytes Relative 7 %   Monocytes Absolute 0.8 0.1 - 1.0  K/uL   Eosinophils Relative 3 %   Eosinophils Absolute 0.3 0.0 - 0.5 K/uL   Basophils Relative 1 %   Basophils Absolute 0.1 0.0 - 0.1 K/uL   Immature Granulocytes 0 %  Abs Immature Granulocytes 0.03 0.00 - 0.07 K/uL  Basic metabolic panel   Collection Time: 03/18/24  3:42 AM  Result Value Ref Range   Sodium 141 135 - 145 mmol/L   Potassium 3.9 3.5 - 5.1 mmol/L   Chloride 102 98 - 111 mmol/L   CO2 29 22 - 32 mmol/L   Glucose, Bld 125 (H) 70 - 99 mg/dL   BUN 28 (H) 8 - 23 mg/dL   Creatinine, Ser 8.65 (H) 0.44 - 1.00 mg/dL   Calcium  9.1 8.9 - 10.3 mg/dL   GFR, Estimated 37 (L) >60 mL/min   Anion gap 11 5 - 15  Protime-INR   Collection Time: 03/18/24  3:42 AM  Result Value Ref Range   Prothrombin Time 29.1 (H) 11.4 - 15.2 seconds   INR 2.6 (H) 0.8 - 1.2  I-stat chem 8, ed   Collection Time: 03/18/24  3:46 AM  Result Value Ref Range   Sodium 141 135 - 145 mmol/L   Potassium 3.7 3.5 - 5.1 mmol/L   Chloride 98 98 - 111 mmol/L   BUN 30 (H) 8 - 23 mg/dL   Creatinine, Ser 8.49 (H) 0.44 - 1.00 mg/dL   Glucose, Bld 875 (H) 70 - 99 mg/dL   Calcium , Ion 1.13 (L) 1.15 - 1.40 mmol/L   TCO2 30 22 - 32 mmol/L   Hemoglobin 13.9 12.0 - 15.0 g/dL   HCT 58.9 63.9 - 53.9 %  Pro Brain natriuretic peptide   Collection Time: 03/18/24  4:36 AM  Result Value Ref Range   Pro Brain Natriuretic Peptide 4,427.0 (H) <300.0 pg/mL   CT Cervical Spine Wo Contrast Result Date: 03/18/2024 EXAM: CT CERVICAL SPINE WITHOUT CONTRAST 03/18/2024 03:57:52 AM TECHNIQUE: CT of the cervical spine was performed without the administration of intravenous contrast. Multiplanar reformatted images are provided for review. Automated exposure control, iterative reconstruction, and/or weight based adjustment of the mA/kV was utilized to reduce the radiation dose to as low as reasonably achievable. COMPARISON: CT of the cervical spine dated 04/12/2022. CLINICAL HISTORY: Polytrauma, blunt. FINDINGS: BONES AND ALIGNMENT: Mild  straightening of the normal cervical lordosis, as before. No acute fracture or traumatic malalignment. DEGENERATIVE CHANGES: Diffuse chronic degenerative disc disease and facet arthrosis throughout the cervical spine. SOFT TISSUES: No prevertebral soft tissue swelling. There is extensive calcific atheromatous disease, particularly involving the carotid bulbs. LUNGS: There is moderate right-sided pleural effusion. IMPRESSION: 1. No evidence of acute traumatic injury. 2. Mild straightening of the normal cervical lordosis, as before. 3. Diffuse chronic degenerative disc disease and facet arthrosis throughout the cervical spine. 4. Extensive calcific atheromatous disease, particularly involving the carotid bulbs. 5. Moderate right pleural effusion. Electronically signed by: Evalene Coho MD MD 03/18/2024 04:17 AM EST RP Workstation: [id number]   CT Head Wo Contrast Result Date: 03/18/2024 EXAM: CT HEAD WITHOUT CONTRAST 03/18/2024 03:57:52 AM TECHNIQUE: CT of the head was performed without the administration of intravenous contrast. Automated exposure control, iterative reconstruction, and/or weight based adjustment of the mA/kV was utilized to reduce the radiation dose to as low as reasonably achievable. COMPARISON: 10/24/2023 CLINICAL HISTORY: Polytrauma, blunt. FINDINGS: BRAIN AND VENTRICLES: No acute hemorrhage. No evidence of acute infarct. Age-related cerebral volume loss. Moderate periventricular and deep cerebral white matter disease. Chronic lacunar infarct within the left internal capsule. No hydrocephalus. No extra-axial collection. No mass effect or midline shift. ORBITS: Bilateral lens replacement. SINUSES: No acute abnormality. SOFT TISSUES AND SKULL: Left frontal scalp hematoma. No skull fracture. VASCULATURE: Moderate calcific atheromatous disease  within the carotid siphons and vertebral arteries. IMPRESSION: 1. No acute intracranial hemorrhage, mass effect, or acute territorial infarct related to  polytrauma. 2. Left frontal scalp hematoma. 3. Chronic microvascular ischemic changes with chronic lacunar infarct in the left internal capsule and age-related cerebral volume loss. Electronically signed by: Evalene Coho MD MD 03/18/2024 04:13 AM EST RP Workstation: [id number]   DG Chest Portable 1 View Result Date: 03/18/2024 EXAM: 1 VIEW(S) XRAY OF THE CHEST 03/18/2024 03:55:16 AM COMPARISON: 08/11/2022 CLINICAL HISTORY: fallonthinners FINDINGS: LINES, TUBES AND DEVICES: Left chest cardiac device. LUNGS AND PLEURA: Mild vascular congestion is noted. New right-sided pleural effusion is noted with underlying atelectasis/infiltrate. Left lung remains clear. No pneumothorax. HEART AND MEDIASTINUM: Cardiomegaly. Aortic atherosclerosis. BONES AND SOFT TISSUES: No acute osseous abnormality. IMPRESSION: 1. New right-sided pleural effusion with underlying atelectasis or infiltrate. Electronically signed by: Oneil Devonshire MD MD 03/18/2024 04:00 AM EST RP Workstation: [id number]     EKG:  EKG Interpretation Date/Time:  Tuesday March 18 2024 05:21:48 EST Ventricular Rate:  73 PR Interval:    QRS Duration:  180 QT Interval:  492 QTC Calculation: 543 R Axis:   -88  Text Interpretation: Atrial fibrillation Right bundle branch block Confirmed by Breckyn Ticas ([id number]) on 03/18/2024 5:32:18 AM         Radiology: CT Cervical Spine Wo Contrast Result Date: 03/18/2024 EXAM: CT CERVICAL SPINE WITHOUT CONTRAST 03/18/2024 03:57:52 AM TECHNIQUE: CT of the cervical spine was performed without the administration of intravenous contrast. Multiplanar reformatted images are provided for review. Automated exposure control, iterative reconstruction, and/or weight based adjustment of the mA/kV was utilized to reduce the radiation dose to as low as reasonably achievable. COMPARISON: CT of the cervical spine dated 04/12/2022. CLINICAL HISTORY: Polytrauma, blunt. FINDINGS: BONES AND ALIGNMENT: Mild straightening of the  normal cervical lordosis, as before. No acute fracture or traumatic malalignment. DEGENERATIVE CHANGES: Diffuse chronic degenerative disc disease and facet arthrosis throughout the cervical spine. SOFT TISSUES: No prevertebral soft tissue swelling. There is extensive calcific atheromatous disease, particularly involving the carotid bulbs. LUNGS: There is moderate right-sided pleural effusion. IMPRESSION: 1. No evidence of acute traumatic injury. 2. Mild straightening of the normal cervical lordosis, as before. 3. Diffuse chronic degenerative disc disease and facet arthrosis throughout the cervical spine. 4. Extensive calcific atheromatous disease, particularly involving the carotid bulbs. 5. Moderate right pleural effusion. Electronically signed by: Evalene Coho MD MD 03/18/2024 04:17 AM EST RP Workstation: [id number]   CT Head Wo Contrast Result Date: 03/18/2024 EXAM: CT HEAD WITHOUT CONTRAST 03/18/2024 03:57:52 AM TECHNIQUE: CT of the head was performed without the administration of intravenous contrast. Automated exposure control, iterative reconstruction, and/or weight based adjustment of the mA/kV was utilized to reduce the radiation dose to as low as reasonably achievable. COMPARISON: 10/24/2023 CLINICAL HISTORY: Polytrauma, blunt. FINDINGS: BRAIN AND VENTRICLES: No acute hemorrhage. No evidence of acute infarct. Age-related cerebral volume loss. Moderate periventricular and deep cerebral white matter disease. Chronic lacunar infarct within the left internal capsule. No hydrocephalus. No extra-axial collection. No mass effect or midline shift. ORBITS: Bilateral lens replacement. SINUSES: No acute abnormality. SOFT TISSUES AND SKULL: Left frontal scalp hematoma. No skull fracture. VASCULATURE: Moderate calcific atheromatous disease within the carotid siphons and vertebral arteries. IMPRESSION: 1. No acute intracranial hemorrhage, mass effect, or acute territorial infarct related to polytrauma. 2. Left  frontal scalp hematoma. 3. Chronic microvascular ischemic changes with chronic lacunar infarct in the left internal capsule and age-related cerebral volume loss. Electronically signed by: Evalene Coho MD  MD 03/18/2024 04:13 AM EST RP Workstation: [id number]   DG Chest Portable 1 View Result Date: 03/18/2024 EXAM: 1 VIEW(S) XRAY OF THE CHEST 03/18/2024 03:55:16 AM COMPARISON: 08/11/2022 CLINICAL HISTORY: fallonthinners FINDINGS: LINES, TUBES AND DEVICES: Left chest cardiac device. LUNGS AND PLEURA: Mild vascular congestion is noted. New right-sided pleural effusion is noted with underlying atelectasis/infiltrate. Left lung remains clear. No pneumothorax. HEART AND MEDIASTINUM: Cardiomegaly. Aortic atherosclerosis. BONES AND SOFT TISSUES: No acute osseous abnormality. IMPRESSION: 1. New right-sided pleural effusion with underlying atelectasis or infiltrate. Electronically signed by: Oneil Devonshire MD MD 03/18/2024 04:00 AM EST RP Workstation: [id number]     Procedures   Medications Ordered in the ED  metroNIDAZOLE  (FLAGYL ) IVPB 500 mg (has no administration in time range)  ceFEPIme  (MAXIPIME ) 2 g in sodium chloride  0.9 % 100 mL IVPB (2 g Intravenous New Bag/Given 03/18/24 0459)  vancomycin  (VANCOREADY) IVPB 1500 mg/300 mL (has no administration in time range)  furosemide  (LASIX ) injection 40 mg (40 mg Intravenous Given 03/18/24 0500)                                    Medical Decision Making Patient presents as a Level 2 fall on thinners on NRB  Amount and/or Complexity of Data Reviewed Independent Historian: EMS    Details: See above  External Data Reviewed: notes.    Details: Previous notes reviewed  Labs: ordered.    Details: Elevated BNP 4427, Elevated PT 29.1 elevated INR 2.6 normal sodium 141, normal potassium 3.7, elevated creatinine 1.5 Radiology: ordered and independent interpretation performed.    Details: Negative head CT ECG/medicine tests: ordered and independent  interpretation performed. Decision-making details documented in ED Course.  Risk OTC drugs. Prescription drug management. Decision regarding hospitalization. Risk Details: Hypoxia with effusion CHF and PNA, on O2 will admit     Final diagnoses:  Fall, initial encounter  Pleural effusion  Pneumonia due to infectious organism, unspecified laterality, unspecified part of lung  Congestive heart failure, unspecified HF chronicity, unspecified heart failure type [facility])   The patient appears reasonably stabilized for admission considering the current resources, flow, and capabilities available in the ED at this time, and I doubt any other [facility] requiring further screening and/or treatment in the ED prior to admission.  ED Discharge Orders     None          Anely Spiewak, MD 03/18/24 0534  "

## 2024-03-18 NOTE — ED Notes (Signed)
 Patient seated on side of bed eating dinner tray. Call bell in patient's lap. No further needs expressed.

## 2024-03-18 NOTE — ED Notes (Signed)
 Trauma Response Nurse Documentation   Lynn Dunn is a 89 y.o. female arriving to [facility] ED via EMS  On warfarin daily. Trauma was activated as a Level 2 by ED charge RN based on the following trauma criteria Elderly patients > 65 with head trauma on anti-coagulation (excluding ASA).  Patient cleared for CT by Dr. Nettie EDP. Pt transported to CT with trauma response nurse present to monitor. RN remained with the patient throughout their absence from the department for clinical observation.   GCS 15.   History   Past Medical History:  Diagnosis Date   Airway malacia    Anemia    Anginal pain 2011   had to take nitro    Arthritis    COPD (chronic obstructive pulmonary disease) (HCC)    Dysuria    GERD (gastroesophageal reflux disease)    Hematuria    HTN (hypertension)    Hx of echocardiogram    Echo (12/15):  Mild LVH, EF 60-65%, mild MR, mod LAE (LA 49 mm), PASP 43 mmHg   Obesity    OSA (obstructive sleep apnea)    cpap at home   PAF (paroxysmal atrial fibrillation) (HCC)    s/p ablation 07/29/09   Sick sinus syndrome (HCC)    pauses with syncope   Stroke (HCC) 6/09   NO RESIDUAL PROBLEMS     Past Surgical History:  Procedure Laterality Date   ABDOMINAL HYSTERECTOMY     ATRIAL ABLATION SURGERY     s/p ablation for afib 07/29/09   CYSTOSCOPY N/A 06/23/2015   Procedure: CYSTOSCOPY;  Surgeon: Lynn Likens, MD;  Location: [facility] ORS;  Service: Urology;  Laterality: N/A;   EP IMPLANTABLE DEVICE N/A 02/17/2015   MDT Advisa MRI PPM implanted by Dr Dunn for sick sinus and syncope   EP IMPLANTABLE DEVICE N/A 02/17/2015   Procedure: Loop Recorder Removal;  Surgeon: Lynn LELON Waddell, MD;  Location: [facility] INVASIVE CV LAB;  Service: Cardiovascular;  Laterality: N/A;   ESOPHAGOGASTRODUODENOSCOPY  01/24/2012   Procedure: ESOPHAGOGASTRODUODENOSCOPY (EGD);  Surgeon: Lynn FORBES Boots, MD;  Location: [facility] ENDOSCOPY;  Service: Endoscopy;  Laterality: N/A;   KNEE ARTHROSCOPY     BILATERAL    LOOP RECORDER IMPLANT N/A 03/12/2014   Procedure: LOOP RECORDER IMPLANT;  Surgeon: Lynn Rakers, MD;  Location: [facility] CATH LAB;  Service: Cardiovascular;  Laterality: N/A;   LYSIS OF ADHESION N/A 06/23/2015   Procedure: LYSIS OF LABIAL ADHESION;  Surgeon: Lynn Likens, MD;  Location: [facility] ORS;  Service: Urology;  Laterality: N/A;   VIDEO BRONCHOSCOPY Bilateral 12/15/2016   Procedure: VIDEO BRONCHOSCOPY WITHOUT FLUORO;  Surgeon: Lynn Lamar RAMAN, MD;  Location: [facility] ENDOSCOPY;  Service: Cardiopulmonary;  Laterality: Bilateral;       Initial Focused Assessment (If applicable, or please see trauma documentation): Alert/oriented female presents via EMS for fall that occurred several hours ago per pt. Bruising and hematoma left forehead. Denies LOC. Also c/o bilateral knee pain, no obvious trauma noted.  Airway patent, known R pleural effusion (recent hospitalization for same) No obvious uncontrolled hemorrhage GCS 15 PERRLA 2  CT's Completed:   CT Head and CT C-Spine   Interventions:  Trauma lab draw Portable chest XRAY CT head and c-spine   Event Summary: Presents via EMS from [employer] after a fall earlier in the day. Hematoma, bruising to left forehead. Bilateral knee pain with no obvious trauma noted. Escorted to CT following portable chest XRAY. Trauma scans unremarkable.    Bedside handoff with ED RN  Lynn Dunn/Lynn Dunn.    Lynn Dunn  Trauma Response RN  Please call TRN at [phone] for further assistance.

## 2024-03-18 NOTE — Progress Notes (Addendum)
 PHARMACY - ANTICOAGULATION CONSULT NOTE  Pharmacy Consult for warfarin Indication: atrial fibrillation s/p PPM  Allergies[1]  Patient Measurements: Height: 5' (152.4 cm) Weight: 73.9 kg (163 lb) IBW/kg (Calculated) : 45.5 HEPARIN  DW (KG): 62  Vital Signs: Temp: 97.8 F (36.6 C) (01/13 0750) Temp Source: Oral (01/13 0750) BP: 139/78 (01/13 0750) Pulse Rate: 72 (01/13 0750)  Labs: Recent Labs    03/18/24 0342 03/18/24 0346  HGB 12.5 13.9  HCT 40.3 41.0  PLT 214  --   LABPROT 29.1*  --   INR 2.6*  --   CREATININE 1.34* 1.50*    Estimated Creatinine Clearance: 21.9 mL/min (A) (by C-G formula based on SCr of 1.5 mg/dL (H)).   Medical History: Past Medical History:  Diagnosis Date   Airway malacia    Anemia    Anginal pain 2011   had to take nitro    Arthritis    COPD (chronic obstructive pulmonary disease) (HCC)    Dysuria    GERD (gastroesophageal reflux disease)    Hematuria    HTN (hypertension)    Hx of echocardiogram    Echo (12/15):  Mild LVH, EF 60-65%, mild MR, mod LAE (LA 49 mm), PASP 43 mmHg   Obesity    OSA (obstructive sleep apnea)    cpap at home   PAF (paroxysmal atrial fibrillation) (HCC)    s/p ablation 07/29/09   Sick sinus syndrome (HCC)    pauses with syncope   Stroke (HCC) 6/09   NO RESIDUAL PROBLEMS   Assessment: [name] is a 89 y.o. year old female presented on 03/18/2024 with concern for respiratory failure found to have pleural effusion with plans for thoracentesis. On warfarin prior to admission for afib. Prior to admission regimen, warfarin 3mg  at bedtime (last dose 1/12). Pharmacy consulted for warfarin inpatient management.   Date INR Warfarin Dose  1/13  2.6, therapeutic     Drug-drug interaction: noted to have a dose of flagyl  in ED which anticipate increase of INR Bleeding assessment: no s/sx bleeding note  Goal of Therapy:  INR 2-3 Monitor platelets by anticoagulation protocol: Yes   Plan:  Warfarin 2mg  x 1  timed for later tonight (smaller dose given drug-drug interaction noted) F/u daily INR, cbc and drug-drug interactions   Thank you for allowing pharmacy to participate in this patient's care.  Leonor GORMAN Bash, PharmD Emergency Medicine Clinical Pharmacist 03/18/2024,11:55 AM    ------------------  Addendum:   Thoracentesis rescheduled for tomorrow, will hold today's dose per discussion with provider.   Thank you for allowing pharmacy to participate in this patient's care.  Leonor GORMAN Bash, PharmD Emergency Medicine Clinical Pharmacist 03/18/2024,3:09 PM      [1]  Allergies Allergen Reactions   Ace Inhibitors Cough   Ventolin  [Albuterol ] Palpitations and Other (See Comments)    Triggers A-Fib    Oysters [Shellfish Allergy] Nausea And Vomiting   Sulfa Antibiotics Rash

## 2024-03-18 NOTE — ED Triage Notes (Signed)
 Pt BIB [facility] from [employer] at [place] due to a fall. Pt was getting up from bed to use the restroom when she lost balance, then falling and hitting her head and both knees. Pt takes Coumadin . Per EMS, no LOC. A&Ox4. EMS and pt states she is [facility].   Pt denies head pain and dizziness.   Hx: respiratory failure   EMS vitals  130/80  86% Sp02

## 2024-03-19 ENCOUNTER — Inpatient Hospital Stay (HOSPITAL_COMMUNITY)

## 2024-03-19 HISTORY — PX: IR THORACENTESIS RIGHT ASP PLEURAL SPACE W/IMG GUIDE: IMG5380

## 2024-03-19 LAB — CBC
HCT: 40.4 % (ref 36.0–46.0)
Hemoglobin: 12.5 g/dL (ref 12.0–15.0)
MCH: 28.7 pg (ref 26.0–34.0)
MCHC: 30.9 g/dL (ref 30.0–36.0)
MCV: 92.9 fL (ref 80.0–100.0)
Platelets: 208 K/uL (ref 150–400)
RBC: 4.35 MIL/uL (ref 3.87–5.11)
RDW: 17.2 % — ABNORMAL HIGH (ref 11.5–15.5)
WBC: 9 K/uL (ref 4.0–10.5)
nRBC: 0 % (ref 0.0–0.2)

## 2024-03-19 LAB — BASIC METABOLIC PANEL WITH GFR
Anion gap: 10 (ref 5–15)
BUN: 24 mg/dL — ABNORMAL HIGH (ref 8–23)
CO2: 30 mmol/L (ref 22–32)
Calcium: 8.9 mg/dL (ref 8.9–10.3)
Chloride: 99 mmol/L (ref 98–111)
Creatinine, Ser: 1.25 mg/dL — ABNORMAL HIGH (ref 0.44–1.00)
GFR, Estimated: 40 mL/min — ABNORMAL LOW
Glucose, Bld: 111 mg/dL — ABNORMAL HIGH (ref 70–99)
Potassium: 3.9 mmol/L (ref 3.5–5.1)
Sodium: 138 mmol/L (ref 135–145)

## 2024-03-19 LAB — BODY FLUID CELL COUNT WITH DIFFERENTIAL
Eos, Fluid: 1 %
Lymphs, Fluid: 27 %
Monocyte-Macrophage-Serous Fluid: 56 % (ref 50–90)
Neutrophil Count, Fluid: 16 % (ref 0–25)
Total Nucleated Cell Count, Fluid: 368 uL (ref 0–1000)

## 2024-03-19 LAB — GRAM STAIN

## 2024-03-19 LAB — PROTIME-INR
INR: 2.7 — ABNORMAL HIGH (ref 0.8–1.2)
Prothrombin Time: 30.4 s — ABNORMAL HIGH (ref 11.4–15.2)

## 2024-03-19 MED ORDER — WARFARIN SODIUM 3 MG PO TABS
3.0000 mg | ORAL_TABLET | Freq: Every day | ORAL | Status: DC
  Administered 2024-03-19 – 2024-03-20 (×2): 3 mg via ORAL
  Filled 2024-03-19 (×3): qty 1

## 2024-03-19 MED ORDER — LIDOCAINE-EPINEPHRINE (PF) 1 %-1:200000 IJ SOLN
10.0000 mL | Freq: Once | INTRAMUSCULAR | Status: AC
  Administered 2024-03-19: 10 mL

## 2024-03-19 MED ORDER — TRAMADOL HCL 50 MG PO TABS
50.0000 mg | ORAL_TABLET | Freq: Four times a day (QID) | ORAL | Status: DC | PRN
  Administered 2024-03-19 (×2): 50 mg via ORAL
  Filled 2024-03-19 (×2): qty 1

## 2024-03-19 MED ORDER — LIDOCAINE-EPINEPHRINE 1 %-1:100000 IJ SOLN
INTRAMUSCULAR | Status: AC
  Filled 2024-03-19: qty 20

## 2024-03-19 NOTE — Progress Notes (Signed)
 Occupational Therapy Evaluation Patient Details Name: Lynn Dunn MRN: [id number] DOB: [birth year] Today's Date: 03/19/2024   History of Present Illness   Pt is 89 year old presented to [facility] on  03/18/24 for fall. CT chest showed moderate right-sided pleural effusion with compressive atelectasis of the right lung.  Underwent IR guided thoracentesis with removal of 1.1 L of pleural fluid 1/14   PMH - copd, arthritis, OSA, afib, chf, CVA, ckd, pacer.  Pt admitted from [facility]     Clinical Impressions Patient will benefit from continued inpatient follow up therapy, <3 hours/day.  Pt admitted with above diagnosis. She demonstrates the below listed deficits: pain, activity tolerance, balance, strength, ADLs, and functional mobility.  She  will benefit from continued OT to maximize safety and independence with BADLs.  PTA, pt resided at [facility] and was independent with ADLs and household ambulation with rollator mod I, but h/o falls.  She utilized a scooter for mobility in the facility.      If plan is discharge home, recommend the following:   A little help with walking and/or transfers;A little help with bathing/dressing/bathroom;Assistance with cooking/housework;Assist for transportation     Functional Status Assessment   Patient has had a recent decline in their functional status and demonstrates the ability to make significant improvements in function in a reasonable and predictable amount of time.     Equipment Recommendations   None recommended by OT     Recommendations for Other Services         Precautions/Restrictions   Precautions Precautions: Fall (02 required)     Mobility Bed Mobility Overal bed mobility: Needs Assistance Bed Mobility: Supine to Sit, Sit to Supine     Supine to sit: Contact guard Sit to supine: Contact guard assist        Transfers Overall transfer level: Needs assistance Equipment used: Rolling  walker (2 wheels) Transfers: Sit to/from Stand, Bed to chair/wheelchair/BSC Sit to Stand: Contact guard assist     Step pivot transfers: Contact guard assist     General transfer comment: min verbal cues for safety      Balance Overall balance assessment: Needs assistance Sitting-balance support: No upper extremity supported Sitting balance-Leahy Scale: Good     Standing balance support: Bilateral upper extremity supported Standing balance-Leahy Scale: Fair                             ADL either performed or assessed with clinical judgement   ADL Overall ADL's : Needs assistance/impaired Eating/Feeding: Independent   Grooming: Set up;Sitting   Upper Body Bathing: Set up;Sitting   Lower Body Bathing: Contact guard assist;Sit to/from stand   Upper Body Dressing : Set up;Sitting   Lower Body Dressing: Contact guard assist;Sit to/from stand   Toilet Transfer: Contact guard assist;Stand-pivot;BSC/3in1   Toileting- Clothing Manipulation and Hygiene: Contact guard assist;Sit to/from stand       Functional mobility during ADLs: Contact guard assist;Rolling walker (2 wheels)       Vision Baseline Vision/History: 1 Wears glasses Ability to See in Adequate Light: 0 Adequate Vision Assessment?: No apparent visual deficits     Perception         Praxis         Pertinent Vitals/Pain Pain Assessment Pain Assessment: 0-10 Pain Score: 8  Pain Location: back, knees, Lt foot, and head Pain Descriptors / Indicators: Aching, Grimacing, Sharp Pain Intervention(s): Limited activity within patient's  tolerance, Monitored during session, Premedicated before session, Repositioned     Extremity/Trunk Assessment Upper Extremity Assessment Upper Extremity Assessment: Generalized weakness (Pt with arthritic deformity bil. UEs, but UEs WFL for her)   Lower Extremity Assessment Lower Extremity Assessment: Generalized weakness (Significant bruising 1st and 2nd toe Lt  foot and increased pain with WBing.  MD made aware)   Cervical / Trunk Assessment Cervical / Trunk Assessment:  (flexed posture)   Communication Communication Communication: No apparent difficulties   Cognition Arousal: Alert Behavior During Therapy: WFL for tasks assessed/performed Cognition: No apparent impairments             OT - Cognition Comments: pt oriented x 4.  Follows commands consistently.   Cognition WFL for tasks assessed                 Following commands: Intact       Cueing  General Comments      sp02 98% on 3L with activity.  Other vitals stable throughout session   Exercises     Shoulder Instructions      Home Living Family/patient expects to be discharged to:: Private residence ([facility]dependent Living at [facility]) Living Arrangements: Alone   Type of Home: Apartment Home Access: Elevator;Level entry     Home Layout: One level     Bathroom Shower/Tub: Producer, Television/film/video: Handicapped height     Home Equipment: Rollator (4 wheels);Cane - single point;Electric scooter;Grab bars - tub/shower;Grab bars - toilet;Shower seat          Prior Functioning/Environment Prior Level of Function : Independent/Modified Independent;History of Falls (last six months) (utilizes 2L 02 at baseline)             Mobility Comments: Pt reports she utilizes rollator in her apartment and uses a scooter to move around the facility ADLs Comments: Pt reports she is independent with ADLs.  She goes to the dining room for all meals. Does not drive    OT Problem List: Decreased strength;Decreased activity tolerance;Impaired balance (sitting and/or standing);Pain   OT Treatment/Interventions: Self-care/ADL training;Therapeutic exercise;Energy conservation;DME and/or AE instruction;Therapeutic activities;Patient/family education;Balance training      OT Goals(Current goals can be found in the care plan section)   Acute Rehab OT  Goals Patient Stated Goal: To have less pain and to go back to [employer] OT Goal Formulation: With patient Time For Goal Achievement: 04/02/24 Potential to Achieve Goals: Good   OT Frequency:  Min 2X/week    Co-evaluation              AM-PAC OT 6 Clicks Daily Activity     Outcome Measure Help from another person eating meals?: None Help from another person taking care of personal grooming?: A Little Help from another person toileting, which includes using toliet, bedpan, or urinal?: A Little Help from another person bathing (including washing, rinsing, drying)?: A Little Help from another person to put on and taking off regular upper body clothing?: A Little Help from another person to put on and taking off regular lower body clothing?: A Little 6 Click Score: 19   End of Session Equipment Utilized During Treatment: Rolling walker (2 wheels);Oxygen  Nurse Communication: Mobility status (pain with WBing Lt foot)  Activity Tolerance: Patient limited by pain Patient left: in bed;with call bell/phone within reach;with bed alarm set  OT Visit Diagnosis: Unsteadiness on feet (R26.81);Repeated falls (R29.6);Muscle weakness (generalized) (M62.81)  Time: 1350-1408 OT Time Calculation (min): 18 min Charges:  OT General Charges $OT Visit: 1 Visit OT Evaluation $OT Eval Moderate Complexity: 1 Mod  Akyah Lagrange C., OTR/L Acute Rehabilitation Services Pager [phone] Office [phone]   Angeline CHRISTELLA Gallery 03/19/2024, 3:44 PM

## 2024-03-19 NOTE — Progress Notes (Signed)
 Heart Failure Navigator Progress Note  Assessed for Heart & Vascular TOC clinic readiness.  Patient does not meet criteria due to she is seen by [facility]. No HF TOC. .   Navigator will sign off at this time.   Stephane Haddock, BSN, Scientist, clinical (histocompatibility and immunogenetics) Only

## 2024-03-19 NOTE — Progress Notes (Signed)
 PT Cancellation Note  Patient Details Name: Lynn Dunn MRN: [id number] DOB: [birth year]   Cancelled Treatment:    Reason Eval/Treat Not Completed: Patient at procedure or test/unavailable. Will try back later.   Rodgers ORN [facility] 03/19/2024, 9:24 AM Rodgers Opal PT Acute [employer] [phone]

## 2024-03-19 NOTE — Progress Notes (Signed)
 PHARMACY - ANTICOAGULATION CONSULT NOTE  Pharmacy Consult for warfarin Indication: atrial fibrillation  Allergies[1]  Patient Measurements: Height: 5' (152.4 cm) Weight: 73.9 kg (163 lb) IBW/kg (Calculated) : 45.5 HEPARIN  DW (KG): 62  Vital Signs: Temp: 98.3 F (36.8 C) (01/14 1149) Temp Source: Oral (01/14 1149) BP: 132/65 (01/14 1149) Pulse Rate: 101 (01/14 1149)  Labs: Recent Labs    03/18/24 0342 03/18/24 0346 03/19/24 0541  HGB 12.5 13.9 12.5  HCT 40.3 41.0 40.4  PLT 214  --  208  LABPROT 29.1*  --  30.4*  INR 2.6*  --  2.7*  CREATININE 1.34* 1.50* 1.25*    Estimated Creatinine Clearance: 26.3 mL/min (A) (by C-G formula based on SCr of 1.25 mg/dL (H)).   Medical History: Past Medical History:  Diagnosis Date   Airway malacia    Anemia    Anginal pain 2011   had to take nitro    Arthritis    COPD (chronic obstructive pulmonary disease) (HCC)    Dysuria    GERD (gastroesophageal reflux disease)    Hematuria    HTN (hypertension)    Hx of echocardiogram    Echo (12/15):  Mild LVH, EF 60-65%, mild MR, mod LAE (LA 49 mm), PASP 43 mmHg   Obesity    OSA (obstructive sleep apnea)    cpap at home   PAF (paroxysmal atrial fibrillation) (HCC)    s/p ablation 07/29/09   Sick sinus syndrome (HCC)    pauses with syncope   Stroke (HCC) 6/09   NO RESIDUAL PROBLEMS   Assessment: Lynn Dunn is a 89 y.o. year old female presented on 03/18/2024 with concern for respiratory failure found to have pleural effusion with plans for thoracentesis. On warfarin prior to admission for afib. Prior to admission regimen, warfarin 3mg  at bedtime (last dose 1/12). Pharmacy consulted for warfarin inpatient management.  INR on admit 2.6 warfarin held 1/13 INR 2.7 today  S/p thoracentesis 1.1L removed > restart warfarin per primary team  CBC stable no bleeding noted  S/p metronidazole  x1 dose - can increase INR  Goal of Therapy:  INR 2-3 Monitor platelets by anticoagulation  protocol: Yes   Plan:  Warfarin 3mg  daily as PTA Daily Protime Monitor s/s bleeding     Olam Chalk Pharm.D. CPP, BCPS Clinical Pharmacist [phone] 03/19/2024 12:55 PM        [1]  Allergies Allergen Reactions   Ace Inhibitors Cough   Ventolin  [Albuterol ] Palpitations and Other (See Comments)    Triggers A-Fib    Oysters [Shellfish Allergy] Nausea And Vomiting   Sulfa Antibiotics Rash

## 2024-03-19 NOTE — Progress Notes (Signed)
 " PROGRESS NOTE  Lynn Dunn  FMW:[id number] DOB: 06-Oct-1930 DOA: 03/18/2024 PCP: Melchor Ozell PARAS, MD (Inactive)   Brief Narrative:  Lynn Dunn is a 89 y.o. female with medical history significant of HFpEF, paroxysmal A-fib status post AV nodal ablation status post pacemaker placement, on anticoagulation with warfarin, CKD stage IIIb, CVA, obesity who presented from [facility] facility after she fell.  She was getting up from the bed to use the restroom, she lost balance and then fell.  She hit her head and both knees.  No history of loss of consciousness.  She bruised her left forehead On presentation, she was hemodynamically stable. Lab work showed creatinine of 1.3.  proBNP of 4427.  INR of 2.6.  Chest x-ray showed right-sided pleural effusion with underlying atelectasis versus infiltrate.  CT chest showed moderate right-sided pleural effusion with compressive atelectasis of the right lung.  Cardiology, pulmonology consulted.  Both signed off.  Underwent IR guided thoracentesis with removal of 1.1 L of pleural fluid.  Remains at baseline oxygen  requirement.  PT evaluation pending.  Possible discharge tomorrow  Assessment & Plan:  Principal Problem:   Recurrent right pleural effusion Active Problems:   Atrial fibrillation (HCC)   SHORTNESS OF BREATH (SOB)   History of CVA (cerebrovascular accident)   Mixed hyperlipidemia   Obesity   Acute hypoxemic respiratory failure (HCC)   Recurrent right-sided pleural effusion: She was recently discharged from [facility] after the management for the same.  She presented there with dyspnea, found to have right pleural effusion.  Effusion suspected to be from underlying CHF, valvular heart disease.  At that time CT imaging of the chest was obtained which showed large right-sided pleural effusion with compressive atelectasis of the right lower lobe, no definitive infiltrate, dilatation of pulmonary artery.  She  underwent thoracentesis by IR.  Fluid was suggestive of transudative in nature. CT done here shows reaccumulation of fluid.  Underwent IR guided thoracentesis with removal of 1.1 L of pleural fluid.  Gram stain did not show any organism.   Fall: [facility]ed from [facility] facility.  CT head showed left frontal scalp hematoma,chronic microvascular ischemic changes with chronic lacunar infarct in the left internal capsule and age-related cerebral volume loss.  CT cervical spine did not show any acute findings.  Consulted PT/OT.  Ambulates with the help of cane, rollator   Community-acquired pneumonia?: Questionable.  Chest CT showed subtle hazy centrilobular attenuation over the right upper lobe which could be seen with an infectious process versus atypical infectious/inflammatory process.  Do not think this is contributing to her pneumonia.  Will continue Augmentin  for now.   Chronic hypoxic respiratory failure: On 2 to 3 L of oxygen  at her facility.  Currently on the same.   Paroxysmal A-fib: History of AV nodal ablation.  Follows with cardiology at [facility].  On warfarin for anticoagulation.  Status post pacemaker placement History of severe tricuspid valve disease, pulmonary hypertension, moderate MVR.  Remains in normal sinus rhythm   History of HFpEF: Presented with pleural effusion.  Elevated BNP.  Will consult cardiology.  Recent echocardiogram on 02/14/2024 showed EF of 50 to 55%, normal wall motion, moderate to severe tricuspid valve regurgitation.  Takes Jardiance , Lasix .  Cardiology consulted here.  She follows with [facility] cardiology.  Lasix  resumed   Hyperlipidemia: On statin   CKD stage IIIb: Chronic and function at baseline   History of CVA/deconditioning/debility: Lives at a facility.  Continue statin.  Ambulates with  the help of cane, rollator          DVT prophylaxis:Warfarin     Code Status: Prior  Family Communication: Sister at bedside on 1/13  Patient status:  Inpatient  Patient is from : Home  Anticipated discharge to: Home  Estimated DC date: Tomorrow   Consultants: CM, cardiology  Procedures: Thoracentesis  Antimicrobials:  Anti-infectives (From admission, onward)    Start     Dose/Rate Route Frequency Ordered Stop   03/18/24 2200  amoxicillin -clavulanate (AUGMENTIN ) 875-125 MG per tablet 1 tablet  Status:  Discontinued        1 tablet Oral Every 12 hours 03/18/24 1251 03/18/24 1549   03/18/24 1700  amoxicillin -clavulanate (AUGMENTIN ) 500-125 MG per tablet 1 tablet        1 tablet Oral 2 times daily with meals 03/18/24 1549     03/18/24 0500  vancomycin  (VANCOREADY) IVPB 1500 mg/300 mL        1,500 mg 150 mL/hr over 120 Minutes Intravenous  Once 03/18/24 0429 03/18/24 1031   03/18/24 0430  vancomycin  (VANCOCIN ) IVPB 1000 mg/200 mL premix  Status:  Discontinued        1,000 mg 200 mL/hr over 60 Minutes Intravenous  Once 03/18/24 0415 03/18/24 0429   03/18/24 0430  metroNIDAZOLE  (FLAGYL ) IVPB 500 mg        500 mg 100 mL/hr over 60 Minutes Intravenous  Once 03/18/24 0415 03/18/24 0644   03/18/24 0430  ceFEPIme  (MAXIPIME ) 2 g in sodium chloride  0.9 % 100 mL IVPB        2 g 200 mL/hr over 30 Minutes Intravenous  Once 03/18/24 0424 03/18/24 0544       Subjective: Patient seen and examined at bedside today.  She just came from thoracentesis.  Appears overall comfortable.  Lying in bed.  On baseline oxygen  requirement of 2 L/min.  Denies any worsening shortness of breath or cough.  No new complaints today.  We discussed about monitoring her 1 more day.  Objective: Vitals:   03/19/24 0245 03/19/24 0609 03/19/24 0800 03/19/24 1149  BP: 139/73 138/75 132/75 132/65  Pulse: 70 78 72 (!) 101  Resp: 20 13 (!) 24 18  Temp: 97.9 F (36.6 C) 98 F (36.7 C) 97.8 F (36.6 C) 98.3 F (36.8 C)  TempSrc: Oral  Oral Oral  SpO2: 100% 100% 94% 98%  Weight:      Height:        Intake/Output Summary (Last 24 hours) at 03/19/2024 1252 Last  data filed at 03/19/2024 1210 Gross per 24 hour  Intake 780 ml  Output 350 ml  Net 430 ml   Filed Weights   03/18/24 0341 03/18/24 0400  Weight: 73.9 kg 73.9 kg    Examination:  General exam: Overall comfortable, not in distress, pleasant elderly female, obese HEENT: PERRL Respiratory system: Diminished sounds on the right side Cardiovascular system: S1 & S2 heard, RRR.  Gastrointestinal system: Abdomen is nondistended, soft and nontender. Central nervous system: Alert and oriented Extremities: No edema, no clubbing ,no cyanosis Skin: No rashes, no ulcers,no icterus     Data Reviewed: I have personally reviewed following labs and imaging studies  CBC: Recent Labs  Lab 03/18/24 0342 03/18/24 0346 03/19/24 0541  WBC 10.2  --  9.0  NEUTROABS 7.3  --   --   HGB 12.5 13.9 12.5  HCT 40.3 41.0 40.4  MCV 93.1  --  92.9  PLT 214  --  208   Basic Metabolic  Panel: Recent Labs  Lab 03/18/24 0342 03/18/24 0346 03/19/24 0541  NA 141 141 138  K 3.9 3.7 3.9  CL 102 98 99  CO2 29  --  30  GLUCOSE 125* 124* 111*  BUN 28* 30* 24*  CREATININE 1.34* 1.50* 1.25*  CALCIUM  9.1  --  8.9     Recent Results (from the past 240 hours)  Blood culture (routine x 2)     Status: None (Preliminary result)   Collection Time: 03/18/24  4:36 AM   Specimen: BLOOD  Result Value Ref Range Status   Specimen Description BLOOD RIGHT ANTECUBITAL  Final   Special Requests   Final    BOTTLES DRAWN AEROBIC AND ANAEROBIC Blood Culture results may not be optimal due to an inadequate volume of blood received in culture bottles   Culture   Final    NO GROWTH 1 DAY Performed at [facility], [address], [place], [place] [address]    Report Status PENDING  Incomplete  Blood culture (routine x 2)     Status: None (Preliminary result)   Collection Time: 03/18/24  4:56 AM   Specimen: BLOOD  Result Value Ref Range Status   Specimen Description BLOOD RIGHT ANTECUBITAL  Final   Special  Requests   Final    BOTTLES DRAWN AEROBIC AND ANAEROBIC Blood Culture adequate volume   Culture   Final    NO GROWTH 1 DAY Performed at [facility], [address] [place], [place] [address]    Report Status PENDING  Incomplete  Gram stain     Status: None   Collection Time: 03/19/24  9:11 AM   Specimen: Pleura  Result Value Ref Range Status   Specimen Description PLEURAL  Final   Special Requests LUNG,RIGHT  Final   Gram Stain   Final    FEW WBC PRESENT, PREDOMINANTLY MONONUCLEAR NO ORGANISMS SEEN Performed at [facility], [address] [place], [place] [address]    Report Status 03/19/2024 FINAL  Final     Radiology Studies: DG Chest Port 1 View Result Date: 03/19/2024 CLINICAL DATA:  Status post right thoracentesis EXAM: PORTABLE CHEST 1 VIEW COMPARISON:  Same day FINDINGS: Right pleural effusion is significantly smaller status post thoracentesis. No definite pneumothorax is noted. IMPRESSION: Significantly smaller right pleural effusion status post thoracentesis. No definite pneumothorax is noted. Electronically Signed   By: Lynwood Landy Raddle M.D.   On: 03/19/2024 09:34   DG CHEST PORT 1 VIEW Result Date: 03/19/2024 EXAM: 1 VIEW(S) XRAY OF THE CHEST 03/19/2024 07:52:32 AM COMPARISON: 03/18/2024 CLINICAL HISTORY: Pleural effusion. ICD10: 857769 - Pleural effusion. FINDINGS: LINES, TUBES AND DEVICES: Left chest cardiac pacing device noted. LUNGS AND PLEURA: Low lung volumes. Similar mild pulmonary edema. Stable moderate right pleural effusion with overlying right basilar opacities. No pneumothorax. HEART AND MEDIASTINUM: Cardiomegaly, unchanged. Atherosclerotic calcifications noted. BONES AND SOFT TISSUES: No acute osseous abnormality. IMPRESSION: 1. Stable moderate right pleural effusion with overlying right basilar opacities. 2. Similar mild pulmonary edema. 3. Unchanged cardiomegaly with left chest cardiac pacing device and atherosclerotic calcifications. Electronically  signed by: Evalene Coho MD 03/19/2024 07:54 AM EST RP Workstation: [id number]   IR US  CHEST Result Date: 03/18/2024 INDICATION: Patient with right moderate pleural effusion with request for diagnostic and therapeutic thoracentesis. Recently underwent thoracentesis at [facility] this week with labs drawn at that time. EXAM: CHEST ULTRASOUND COMPARISON:  03/18/24 CT Chest FINDINGS: Moderate right-sided pleural effusion. Pre-procedural US  imaging absolutely supports  moderate amount of fluid; however, lung flap present close with borderline window for puncture. Risks outweigh the benefits. IMPRESSION: Moderate right-sided pleural effusion with poor window for percutaneous access. Recommend bringing patient back down tomorrow to see if any increase in fluid and improved window for puncture given labs previously drawn at puncture by [facility] within the last week and that fluid accumulates so quickly. Performed by Laymon Coast, NP Electronically Signed   By: Juliene Balder M.D.   On: 03/18/2024 14:25   CT CHEST WO CONTRAST Addendum Date: 03/18/2024 ADDENDUM REPORT: 03/18/2024 10:19 ADDENDUM: Note that there is stable minimal bilateral pleural calcification which may be due to prior trauma versus asbestos related pleural disease. Electronically Signed   By: Toribio Agreste M.D.   On: 03/18/2024 10:19   Result Date: 03/18/2024 CLINICAL DATA:  Possible pleural effusion. Pneumonia. Shortness of breath. EXAM: CT CHEST WITHOUT CONTRAST TECHNIQUE: Multidetector CT imaging of the chest was performed following the standard protocol without IV contrast. RADIATION DOSE REDUCTION: This exam was performed according to the departmental dose-optimization program which includes automated exposure control, adjustment of the mA and/or kV according to patient size and/or use of iterative reconstruction technique. COMPARISON:  04/12/2022 FINDINGS: Cardiovascular: Mild stable cardiomegaly. Left-sided cardiac pacemaker is present. Moderate  calcified plaque over the mitral valve annulus. Calcified plaque over the left main and 3 vessel atherosclerotic coronary arteries. Ascending thoracic aorta measures 3.9 cm unchanged. There is calcified plaque throughout the thoracic aorta. Pulmonary arterial system is unremarkable on this noncontrast exam. Remaining vascular structures are unchanged. Mediastinum/Nodes: No evidence of mediastinal or hilar adenopathy on this noncontrast exam. Remaining mediastinal structures are unremarkable. Lungs/Pleura: Lungs are adequately inflated. There is a new moderate size right pleural effusion with associated compressive atelectasis in the right base. Mild scarring and atelectasis over the right middle lobe. There is subtle hazy centrilobular attenuation over the right upper lobe which could be seen with an infectious process versus atypical infectious/inflammatory process. Minimal linear scarring over the lingula/left upper lobe. Airways are unremarkable. Upper Abdomen: Images through the upper abdomen demonstrate mild nodular contour of the liver which can be seen with cirrhosis. Calcified plaque over the abdominal aorta which is normal in caliber. Diverticulosis of the colon. Musculoskeletal: Moderate degenerative changes of the spine. IMPRESSION: 1. New moderate size right pleural effusion with associated compressive atelectasis in the right base. 2. Subtle hazy centrilobular attenuation over the right upper lobe which could be seen with an infectious process versus atypical infectious/inflammatory process. 3. Stable mild cardiomegaly. Atherosclerotic vascular disease including the left main and 3 vessel coronary arteries. 4. Mild nodular contour of the liver which can be seen with cirrhosis. 5. Diverticulosis of the colon. 6. Aortic atherosclerosis. Aortic Atherosclerosis (ICD10-I70.0). Electronically Signed: By: Toribio Agreste M.D. On: 03/18/2024 09:14   CT Cervical Spine Wo Contrast Result Date: 03/18/2024 EXAM:  CT CERVICAL SPINE WITHOUT CONTRAST 03/18/2024 03:57:52 AM TECHNIQUE: CT of the cervical spine was performed without the administration of intravenous contrast. Multiplanar reformatted images are provided for review. Automated exposure control, iterative reconstruction, and/or weight based adjustment of the mA/kV was utilized to reduce the radiation dose to as low as reasonably achievable. COMPARISON: CT of the cervical spine dated 04/12/2022. CLINICAL HISTORY: Polytrauma, blunt. FINDINGS: BONES AND ALIGNMENT: Mild straightening of the normal cervical lordosis, as before. No acute fracture or traumatic malalignment. DEGENERATIVE CHANGES: Diffuse chronic degenerative disc disease and facet arthrosis throughout the cervical spine. SOFT TISSUES: No prevertebral soft tissue swelling. There is extensive calcific  atheromatous disease, particularly involving the carotid bulbs. LUNGS: There is moderate right-sided pleural effusion. IMPRESSION: 1. No evidence of acute traumatic injury. 2. Mild straightening of the normal cervical lordosis, as before. 3. Diffuse chronic degenerative disc disease and facet arthrosis throughout the cervical spine. 4. Extensive calcific atheromatous disease, particularly involving the carotid bulbs. 5. Moderate right pleural effusion. Electronically signed by: Evalene Coho MD MD 03/18/2024 04:17 AM EST RP Workstation: [id number]   CT Head Wo Contrast Result Date: 03/18/2024 EXAM: CT HEAD WITHOUT CONTRAST 03/18/2024 03:57:52 AM TECHNIQUE: CT of the head was performed without the administration of intravenous contrast. Automated exposure control, iterative reconstruction, and/or weight based adjustment of the mA/kV was utilized to reduce the radiation dose to as low as reasonably achievable. COMPARISON: 10/24/2023 CLINICAL HISTORY: Polytrauma, blunt. FINDINGS: BRAIN AND VENTRICLES: No acute hemorrhage. No evidence of acute infarct. Age-related cerebral volume loss. Moderate periventricular  and deep cerebral white matter disease. Chronic lacunar infarct within the left internal capsule. No hydrocephalus. No extra-axial collection. No mass effect or midline shift. ORBITS: Bilateral lens replacement. SINUSES: No acute abnormality. SOFT TISSUES AND SKULL: Left frontal scalp hematoma. No skull fracture. VASCULATURE: Moderate calcific atheromatous disease within the carotid siphons and vertebral arteries. IMPRESSION: 1. No acute intracranial hemorrhage, mass effect, or acute territorial infarct related to polytrauma. 2. Left frontal scalp hematoma. 3. Chronic microvascular ischemic changes with chronic lacunar infarct in the left internal capsule and age-related cerebral volume loss. Electronically signed by: Evalene Coho MD MD 03/18/2024 04:13 AM EST RP Workstation: [id number]   DG Chest Portable 1 View Result Date: 03/18/2024 EXAM: 1 VIEW(S) XRAY OF THE CHEST 03/18/2024 03:55:16 AM COMPARISON: 08/11/2022 CLINICAL HISTORY: fallonthinners FINDINGS: LINES, TUBES AND DEVICES: Left chest cardiac device. LUNGS AND PLEURA: Mild vascular congestion is noted. New right-sided pleural effusion is noted with underlying atelectasis/infiltrate. Left lung remains clear. No pneumothorax. HEART AND MEDIASTINUM: Cardiomegaly. Aortic atherosclerosis. BONES AND SOFT TISSUES: No acute osseous abnormality. IMPRESSION: 1. New right-sided pleural effusion with underlying atelectasis or infiltrate. Electronically signed by: Oneil Devonshire MD MD 03/18/2024 04:00 AM EST RP Workstation: [id number]    Scheduled Meds:  amoxicillin -clavulanate  1 tablet Oral BID WC   cholecalciferol   1,000 Units Oral Daily   empagliflozin   10 mg Oral Daily   furosemide   40 mg Oral BID   lidocaine -EPINEPHrine   20 mL Intradermal Once   polyethylene glycol  17 g Oral Daily   rosuvastatin   10 mg Oral QHS   Warfarin - Pharmacist Dosing Inpatient   Does not apply q1600   Continuous Infusions:   LOS: 1 day   Ivonne Mustache, MD [facility] P1/14/2026, 12:52 PM  "

## 2024-03-19 NOTE — ED Notes (Signed)
 Bruising noted to the left upper arm due to blood pressure cuff inflating and squeezing.

## 2024-03-19 NOTE — ED Notes (Signed)
Pt given soda to drink.

## 2024-03-19 NOTE — Progress Notes (Signed)
 "  Progress Note  Patient Name: Lynn Dunn Date of Encounter: 03/19/2024  Primary Cardiologist: None   Subjective   Postthoracentesis today.  Doing well no complaints.  Inpatient Medications    Scheduled Meds:  amoxicillin -clavulanate  1 tablet Oral BID WC   cholecalciferol   1,000 Units Oral Daily   empagliflozin   10 mg Oral Daily   furosemide   40 mg Oral BID   lidocaine -EPINEPHrine   20 mL Intradermal Once   polyethylene glycol  17 g Oral Daily   rosuvastatin   10 mg Oral QHS   Warfarin - Pharmacist Dosing Inpatient   Does not apply q1600   Continuous Infusions:  PRN Meds: acetaminophen , ipratropium-albuterol , ondansetron  (ZOFRAN ) IV, ondansetron    Vital Signs    Vitals:   03/18/24 2140 03/19/24 0209 03/19/24 0245 03/19/24 0609  BP: (!) 162/61  139/73 138/75  Pulse: 75  70 78  Resp: 18  20 13   Temp: 97.8 F (36.6 C) 97.9 F (36.6 C) 97.9 F (36.6 C) 98 F (36.7 C)  TempSrc: Oral Oral Oral   SpO2: 100%  100% 100%  Weight:      Height:        Intake/Output Summary (Last 24 hours) at 03/19/2024 0717 Last data filed at 03/18/2024 2348 Gross per 24 hour  Intake 400 ml  Output 350 ml  Net 50 ml   Filed Weights   03/18/24 0341 03/18/24 0400  Weight: 73.9 kg 73.9 kg    Telemetry    Paced rhythm- Personally Reviewed  ECG    Paced rhythm- Personally Reviewed  Physical Exam   Physical Exam Vitals and nursing note reviewed.  Constitutional:      Appearance: Normal appearance.  HENT:     Head: Normocephalic and atraumatic.  Eyes:     Conjunctiva/sclera: Conjunctivae normal.  Cardiovascular:     Rate and Rhythm: Normal rate.  Pulmonary:     Effort: Pulmonary effort is normal.  Musculoskeletal:        General: No swelling.  Skin:    Coloration: Skin is not jaundiced.  Neurological:     Mental Status: She is alert.      Labs    Chemistry Recent Labs  Lab 03/18/24 0342 03/18/24 0346 03/19/24 0541  NA 141 141 138  K 3.9 3.7 3.9  CL  102 98 99  CO2 29  --  30  GLUCOSE 125* 124* 111*  BUN 28* 30* 24*  CREATININE 1.34* 1.50* 1.25*  CALCIUM  9.1  --  8.9  GFRNONAA 37*  --  40*  ANIONGAP 11  --  10     Hematology Recent Labs  Lab 03/18/24 0342 03/18/24 0346 03/19/24 0541  WBC 10.2  --  9.0  RBC 4.33  --  4.35  HGB 12.5 13.9 12.5  HCT 40.3 41.0 40.4  MCV 93.1  --  92.9  MCH 28.9  --  28.7  MCHC 31.0  --  30.9  RDW 17.2*  --  17.2*  PLT 214  --  208    Cardiac EnzymesNo results for input(s): TROPONINI in the last 168 hours. No results for input(s): TROPIPOC in the last 168 hours.   BNP Recent Labs  Lab 03/18/24 0436  PROBNP 4,427.0*     DDimer No results for input(s): DDIMER in the last 168 hours.   Radiology    IR US  CHEST Result Date: 03/18/2024 INDICATION: Patient with right moderate pleural effusion with request for diagnostic and therapeutic thoracentesis. Recently underwent thoracentesis at [facility]  this week with labs drawn at that time. EXAM: CHEST ULTRASOUND COMPARISON:  03/18/24 CT Chest FINDINGS: Moderate right-sided pleural effusion. Pre-procedural US  imaging absolutely supports moderate amount of fluid; however, lung flap present close with borderline window for puncture. Risks outweigh the benefits. IMPRESSION: Moderate right-sided pleural effusion with poor window for percutaneous access. Recommend bringing patient back down tomorrow to see if any increase in fluid and improved window for puncture given labs previously drawn at puncture by [facility] within the last week and that fluid accumulates so quickly. Performed by Laymon Coast, NP Electronically Signed   By: Juliene Balder M.D.   On: 03/18/2024 14:25   CT CHEST WO CONTRAST Addendum Date: 03/18/2024 ADDENDUM REPORT: 03/18/2024 10:19 ADDENDUM: Note that there is stable minimal bilateral pleural calcification which may be due to prior trauma versus asbestos related pleural disease. Electronically Signed   By: Toribio Agreste M.D.   On:  03/18/2024 10:19   Result Date: 03/18/2024 CLINICAL DATA:  Possible pleural effusion. Pneumonia. Shortness of breath. EXAM: CT CHEST WITHOUT CONTRAST TECHNIQUE: Multidetector CT imaging of the chest was performed following the standard protocol without IV contrast. RADIATION DOSE REDUCTION: This exam was performed according to the departmental dose-optimization program which includes automated exposure control, adjustment of the mA and/or kV according to patient size and/or use of iterative reconstruction technique. COMPARISON:  04/12/2022 FINDINGS: Cardiovascular: Mild stable cardiomegaly. Left-sided cardiac pacemaker is present. Moderate calcified plaque over the mitral valve annulus. Calcified plaque over the left main and 3 vessel atherosclerotic coronary arteries. Ascending thoracic aorta measures 3.9 cm unchanged. There is calcified plaque throughout the thoracic aorta. Pulmonary arterial system is unremarkable on this noncontrast exam. Remaining vascular structures are unchanged. Mediastinum/Nodes: No evidence of mediastinal or hilar adenopathy on this noncontrast exam. Remaining mediastinal structures are unremarkable. Lungs/Pleura: Lungs are adequately inflated. There is a new moderate size right pleural effusion with associated compressive atelectasis in the right base. Mild scarring and atelectasis over the right middle lobe. There is subtle hazy centrilobular attenuation over the right upper lobe which could be seen with an infectious process versus atypical infectious/inflammatory process. Minimal linear scarring over the lingula/left upper lobe. Airways are unremarkable. Upper Abdomen: Images through the upper abdomen demonstrate mild nodular contour of the liver which can be seen with cirrhosis. Calcified plaque over the abdominal aorta which is normal in caliber. Diverticulosis of the colon. Musculoskeletal: Moderate degenerative changes of the spine. IMPRESSION: 1. New moderate size right pleural  effusion with associated compressive atelectasis in the right base. 2. Subtle hazy centrilobular attenuation over the right upper lobe which could be seen with an infectious process versus atypical infectious/inflammatory process. 3. Stable mild cardiomegaly. Atherosclerotic vascular disease including the left main and 3 vessel coronary arteries. 4. Mild nodular contour of the liver which can be seen with cirrhosis. 5. Diverticulosis of the colon. 6. Aortic atherosclerosis. Aortic Atherosclerosis (ICD10-I70.0). Electronically Signed: By: Toribio Agreste M.D. On: 03/18/2024 09:14   CT Cervical Spine Wo Contrast Result Date: 03/18/2024 EXAM: CT CERVICAL SPINE WITHOUT CONTRAST 03/18/2024 03:57:52 AM TECHNIQUE: CT of the cervical spine was performed without the administration of intravenous contrast. Multiplanar reformatted images are provided for review. Automated exposure control, iterative reconstruction, and/or weight based adjustment of the mA/kV was utilized to reduce the radiation dose to as low as reasonably achievable. COMPARISON: CT of the cervical spine dated 04/12/2022. CLINICAL HISTORY: Polytrauma, blunt. FINDINGS: BONES AND ALIGNMENT: Mild straightening of the normal cervical lordosis, as before. No acute fracture or traumatic  malalignment. DEGENERATIVE CHANGES: Diffuse chronic degenerative disc disease and facet arthrosis throughout the cervical spine. SOFT TISSUES: No prevertebral soft tissue swelling. There is extensive calcific atheromatous disease, particularly involving the carotid bulbs. LUNGS: There is moderate right-sided pleural effusion. IMPRESSION: 1. No evidence of acute traumatic injury. 2. Mild straightening of the normal cervical lordosis, as before. 3. Diffuse chronic degenerative disc disease and facet arthrosis throughout the cervical spine. 4. Extensive calcific atheromatous disease, particularly involving the carotid bulbs. 5. Moderate right pleural effusion. Electronically signed by:  Evalene Coho MD MD 03/18/2024 04:17 AM EST RP Workstation: [id number]   CT Head Wo Contrast Result Date: 03/18/2024 EXAM: CT HEAD WITHOUT CONTRAST 03/18/2024 03:57:52 AM TECHNIQUE: CT of the head was performed without the administration of intravenous contrast. Automated exposure control, iterative reconstruction, and/or weight based adjustment of the mA/kV was utilized to reduce the radiation dose to as low as reasonably achievable. COMPARISON: 10/24/2023 CLINICAL HISTORY: Polytrauma, blunt. FINDINGS: BRAIN AND VENTRICLES: No acute hemorrhage. No evidence of acute infarct. Age-related cerebral volume loss. Moderate periventricular and deep cerebral white matter disease. Chronic lacunar infarct within the left internal capsule. No hydrocephalus. No extra-axial collection. No mass effect or midline shift. ORBITS: Bilateral lens replacement. SINUSES: No acute abnormality. SOFT TISSUES AND SKULL: Left frontal scalp hematoma. No skull fracture. VASCULATURE: Moderate calcific atheromatous disease within the carotid siphons and vertebral arteries. IMPRESSION: 1. No acute intracranial hemorrhage, mass effect, or acute territorial infarct related to polytrauma. 2. Left frontal scalp hematoma. 3. Chronic microvascular ischemic changes with chronic lacunar infarct in the left internal capsule and age-related cerebral volume loss. Electronically signed by: Evalene Coho MD MD 03/18/2024 04:13 AM EST RP Workstation: [id number]   DG Chest Portable 1 View Result Date: 03/18/2024 EXAM: 1 VIEW(S) XRAY OF THE CHEST 03/18/2024 03:55:16 AM COMPARISON: 08/11/2022 CLINICAL HISTORY: fallonthinners FINDINGS: LINES, TUBES AND DEVICES: Left chest cardiac device. LUNGS AND PLEURA: Mild vascular congestion is noted. New right-sided pleural effusion is noted with underlying atelectasis/infiltrate. Left lung remains clear. No pneumothorax. HEART AND MEDIASTINUM: Cardiomegaly. Aortic atherosclerosis. BONES AND SOFT TISSUES: No acute  osseous abnormality. IMPRESSION: 1. New right-sided pleural effusion with underlying atelectasis or infiltrate. Electronically signed by: Oneil Devonshire MD MD 03/18/2024 04:00 AM EST RP Workstation: [id number]    Cardiac Studies     Patient Profile     This is a 89 year old female who follows at [facility] for cardiac care with a complicated past medical history of permanent atrial fibrillation on coumadin  and post ablation (01/04/24), SSS s/p Medtronic dual chamber (02/2015), HFimpEF, AS, severe TR (being worked up for TEER), left subclavian and axillary vein stenosis, non obstructive CAD, HTN, HLD, orthostatic hypotension, OSA, CKD III, COPD, CVA, pleural effusion s/p thoracentesis last week who presents to [facility] following a fall at her ALF. She was found to have an elevated pro BNP prompting cardiology evaluation.   Assessment & Plan   Chronic hypoxic respiratory failure Acute on chronic HFimpEF-appears at baseline and on home meds.  Continue home Lasix  40 mg p.o. twice daily, Jardiance  10 mg.  Follows with [facility] cardiology. Moderate to severe TR - She follows with [facility] for cardiac care and was seen 03/17/2024 with plan for outpatient [facility] for TEER workup Permanent atrial fibrillation Nonischemic cardiomyopathy SSS s/p Medtronic DC PPM Pleural effusion s/p thoracentesis 03/19/2024  No further recommendations from cardiac standpoint.  Cardiology will sign off.  Patient should follow-up with her primary cardiologist at [facility] following discharge.  For questions or updates, please contact  [facility] Please consult [email or web address] for contact info under        Signed, Emeline Calender, DO 03/19/2024, 7:17 AM    "

## 2024-03-19 NOTE — ED Notes (Signed)
 Pt given graham crackers, peanut butter, and orange juice to eat/drink.

## 2024-03-19 NOTE — ED Notes (Signed)
 Pt back from procedure

## 2024-03-19 NOTE — Procedures (Signed)
 PROCEDURE SUMMARY:  Successful image-guided right thoracentesis. Yielded 1.1 liters of slightly hazy yellow fluid. Patient tolerated procedure well. EBL: trace No immediate complications.  Specimen was sent for labs. Post procedure CXR ordered.  Please see imaging section of Epic for full dictation.  Kimble DEL Margaretann Abate PA-C 03/19/2024 9:28 AM

## 2024-03-19 NOTE — Progress Notes (Signed)
 PT Cancellation Note  Patient Details Name: Lynn Dunn MRN: [id number] DOB: 08-08-1930   Cancelled Treatment:    Reason Eval/Treat Not Completed: Other (comment). Had checked before lunch and nursing and pt felt pt too fatigued. Just seen by OT now. Will try again tomorrow.    Rodgers ORN [facility] 03/19/2024, 2:48 PM Rodgers Opal PT Acute [employer] [phone]

## 2024-03-20 ENCOUNTER — Other Ambulatory Visit (HOSPITAL_COMMUNITY): Payer: Self-pay

## 2024-03-20 LAB — CBC
HCT: 35.9 % — ABNORMAL LOW (ref 36.0–46.0)
Hemoglobin: 11.3 g/dL — ABNORMAL LOW (ref 12.0–15.0)
MCH: 28.8 pg (ref 26.0–34.0)
MCHC: 31.5 g/dL (ref 30.0–36.0)
MCV: 91.6 fL (ref 80.0–100.0)
Platelets: 201 K/uL (ref 150–400)
RBC: 3.92 MIL/uL (ref 3.87–5.11)
RDW: 17.2 % — ABNORMAL HIGH (ref 11.5–15.5)
WBC: 8.3 K/uL (ref 4.0–10.5)
nRBC: 0 % (ref 0.0–0.2)

## 2024-03-20 LAB — BASIC METABOLIC PANEL WITH GFR
Anion gap: 7 (ref 5–15)
BUN: 22 mg/dL (ref 8–23)
CO2: 34 mmol/L — ABNORMAL HIGH (ref 22–32)
Calcium: 8.2 mg/dL — ABNORMAL LOW (ref 8.9–10.3)
Chloride: 101 mmol/L (ref 98–111)
Creatinine, Ser: 1.27 mg/dL — ABNORMAL HIGH (ref 0.44–1.00)
GFR, Estimated: 40 mL/min — ABNORMAL LOW
Glucose, Bld: 93 mg/dL (ref 70–99)
Potassium: 3.8 mmol/L (ref 3.5–5.1)
Sodium: 142 mmol/L (ref 135–145)

## 2024-03-20 LAB — PROTEIN, BODY FLUID (OTHER)
Source of Sample: 19588
Total Protein, Body Fluid Other: 3.5 g/dL

## 2024-03-20 LAB — PROTIME-INR
INR: 2.6 — ABNORMAL HIGH (ref 0.8–1.2)
Prothrombin Time: 28.9 s — ABNORMAL HIGH (ref 11.4–15.2)

## 2024-03-20 LAB — CYTOLOGY - NON PAP

## 2024-03-20 MED ORDER — AMOXICILLIN-POT CLAVULANATE 500-125 MG PO TABS
1.0000 | ORAL_TABLET | Freq: Two times a day (BID) | ORAL | 0 refills | Status: AC
  Filled 2024-03-20: qty 6, 3d supply, fill #0

## 2024-03-20 NOTE — Progress Notes (Signed)
 PHARMACY - ANTICOAGULATION CONSULT NOTE  Pharmacy Consult for warfarin Indication: atrial fibrillation  Allergies[1]  Patient Measurements: Height: 5' (152.4 cm) Weight: 73.5 kg (162 lb 0.6 oz) IBW/kg (Calculated) : 45.5 HEPARIN  DW (KG): 62  Vital Signs: Temp: 98 F (36.7 C) (01/15 0757) Temp Source: Oral (01/15 0757) BP: 122/56 (01/15 0757) Pulse Rate: 72 (01/15 0757)  Labs: Recent Labs    03/18/24 0342 03/18/24 0346 03/19/24 0541 03/20/24 0837  HGB 12.5 13.9 12.5 11.3*  HCT 40.3 41.0 40.4 35.9*  PLT 214  --  208 201  LABPROT 29.1*  --  30.4* 28.9*  INR 2.6*  --  2.7* 2.6*  CREATININE 1.34* 1.50* 1.25* 1.27*    Estimated Creatinine Clearance: 25.8 mL/min (A) (by C-G formula based on SCr of 1.27 mg/dL (H)).   Medical History: Past Medical History:  Diagnosis Date   Airway malacia    Anemia    Anginal pain 2011   had to take nitro    Arthritis    COPD (chronic obstructive pulmonary disease) (HCC)    Dysuria    GERD (gastroesophageal reflux disease)    Hematuria    HTN (hypertension)    Hx of echocardiogram    Echo (12/15):  Mild LVH, EF 60-65%, mild MR, mod LAE (LA 49 mm), PASP 43 mmHg   Obesity    OSA (obstructive sleep apnea)    cpap at home   PAF (paroxysmal atrial fibrillation) (HCC)    s/p ablation 07/29/09   Sick sinus syndrome (HCC)    pauses with syncope   Stroke (HCC) 6/09   NO RESIDUAL PROBLEMS   Assessment: 38 yoF admitted with pleural effusion s/p thoracentesis. Pt on warfarin PTA for AF, held initially now resumed 1/14 post-thora.  INR today remains therapeutic at 2.6.  Goal of Therapy:  INR 2-3 Monitor platelets by anticoagulation protocol: Yes   Plan:  Warfarin 3mg  daily as PTA Daily Protime Monitor s/s bleeding    Ozell Jamaica, PharmD, BCPS, [facility] Clinical Pharmacist [phone] Please check AMION for all [facility] Pharmacy numbers 03/20/2024       [1]  Allergies Allergen Reactions   Ace Inhibitors Cough   Ventolin   [Albuterol ] Palpitations and Other (See Comments)    Triggers A-Fib    Oysters [Shellfish Allergy] Nausea And Vomiting   Sulfa Antibiotics Rash

## 2024-03-20 NOTE — NC FL2 (Signed)
 " [place]  MEDICAID FL2 LEVEL OF CARE FORM     IDENTIFICATION  Patient Name: Lynn Dunn Birthdate: Jul 19, 1930 Sex: female Admission Date (Current Location): 03/18/2024  [facility] and [place] Number:  Producer, Television/film/video and Address:  [facility] [place], [address], [place], [place] [address]      Provider Number: [id number]  Attending Physician Name and Address:  Jillian Buttery, MD  Relative Name and Phone Number:  Beverley Hick (sister) [phone]    Current Level of Care: Hospital Recommended Level of Care: Skilled Nursing Facility Prior Approval Number:    Date Approved/Denied:   PASRR Number: [id number]  Discharge Plan: SNF    Current Diagnoses: Patient Active Problem List   Diagnosis Date Noted   Acute hypoxemic respiratory failure (HCC) 03/18/2024   Recurrent right pleural effusion 03/18/2024   Secondary hypercoagulable state 11/13/2019   Diastolic dysfunction 07/11/2018   Acute respiratory failure with hypoxia (HCC) 06/13/2018   Chest pain 06/13/2018   Abnormal EKG 06/13/2018   Airway malacia    Restrictive lung disease 01/18/2017   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/07/2016   COPD (chronic obstructive pulmonary disease) (HCC) 04/05/2016   Subtherapeutic international normalized ratio (INR) 04/05/2016   Atrial fibrillation with rapid ventricular response (HCC) 04/04/2016   Carotid artery disease 11/25/2015   Pre-syncope 02/16/2015   Sick sinus syndrome (HCC) 02/15/2015   Swelling of right lower extremity 08/31/2014   Mixed hyperlipidemia 12/27/2012   Obesity 12/27/2012   GI bleed 01/23/2012   Anemia 01/23/2012   History of CVA (cerebrovascular accident) 01/23/2012   SHORTNESS OF BREATH (SOB) 09/13/2009   Essential hypertension, benign 06/17/2009   Atrial fibrillation (HCC) 06/17/2009   OSA (obstructive sleep apnea) 06/17/2009    Orientation RESPIRATION BLADDER Height & Weight     Self, Time, Situation, Place  O2 (Nasal Cannula 2 liters)  Continent Weight: 162 lb 0.6 oz (73.5 kg) Height:  5' (152.4 cm)  BEHAVIORAL SYMPTOMS/MOOD NEUROLOGICAL BOWEL NUTRITION STATUS        Diet (Please see discharge summary)  AMBULATORY STATUS COMMUNICATION OF NEEDS Skin   Limited Assist Verbally Other (Comment) (Hematoma,Left forehead,Wound/Incision/LDAs,Wound,other,eye,L)                       Personal Care Assistance Level of Assistance  Bathing, Feeding, Dressing Bathing Assistance: Limited assistance Feeding assistance: Independent Dressing Assistance: Limited assistance     Functional Limitations Info  Sight, Hearing, Speech Sight Info: Adequate Hearing Info: Adequate Speech Info: Adequate    SPECIAL CARE FACTORS FREQUENCY  PT (By licensed PT), OT (By licensed OT)     PT Frequency: 5x min weekly OT Frequency: 5x min weekly            Contractures Contractures Info: Not present    Additional Factors Info  Code Status, Allergies Code Status Info: FULL Allergies Info: Ace Inhibitors,Ventolin  (albuterol ),Oysters (shellfish Allergy),Oysters (shellfish Allergy)           Current Medications (03/20/2024):  This is the current hospital active medication list Current Facility-Administered Medications  Medication Dose Route Frequency Provider Last Rate Last Admin   acetaminophen  (TYLENOL ) tablet 650 mg  650 mg Oral Q6H PRN Jillian Buttery, MD   650 mg at 03/18/24 2140   amoxicillin -clavulanate (AUGMENTIN ) 500-125 MG per tablet 1 tablet  1 tablet Oral BID WC Reome, Earle J, RPH   1 tablet at 03/20/24 1205   cholecalciferol  (VITAMIN D3) 25 MCG (1000 UNIT) tablet 1,000 Units  1,000 Units Oral Daily  Jillian Buttery, MD   1,000 Units at 03/20/24 9050   empagliflozin  (JARDIANCE ) tablet 10 mg  10 mg Oral Daily Jillian Buttery, MD   10 mg at 03/20/24 9050   furosemide  (LASIX ) tablet 40 mg  40 mg Oral BID Jillian Buttery, MD   40 mg at 03/20/24 0949   ipratropium-albuterol  (DUONEB) 0.5-2.5 (3) MG/3ML nebulizer solution 3 mL  3  mL Nebulization Q4H PRN Payne, John D, PA-C       lidocaine -EPINEPHrine  (XYLOCAINE  W/EPI) 1 %-1:100000 (with pres) injection 20 mL  20 mL Intradermal Once Henn, Adam, MD       ondansetron  (ZOFRAN ) injection 4 mg  4 mg Intravenous Q6H PRN Jillian Buttery, MD       ondansetron  (ZOFRAN -ODT) disintegrating tablet 4 mg  4 mg Oral Q8H PRN Adhikari, Amrit, MD       polyethylene glycol (MIRALAX  / GLYCOLAX ) packet 17 g  17 g Oral Daily Adhikari, Amrit, MD       rosuvastatin  (CRESTOR ) tablet 10 mg  10 mg Oral QHS Adhikari, Amrit, MD   10 mg at 03/19/24 2103   traMADol  (ULTRAM ) tablet 50 mg  50 mg Oral Q6H PRN Adhikari, Amrit, MD   50 mg at 03/19/24 2102   warfarin (COUMADIN ) tablet 3 mg  3 mg Oral q1600 Adhikari, Amrit, MD   3 mg at 03/19/24 1553   Warfarin - Pharmacist Dosing Inpatient   Does not apply [id number] Jillian Buttery, MD   Given at 03/19/24 1553     Discharge Medications: Please see discharge summary for a list of discharge medications.  Relevant Imaging Results:  Relevant Lab Results:   Additional Information SSN-[id number]  Isaiah Public, LCSWA     "

## 2024-03-20 NOTE — Evaluation (Signed)
 Physical Therapy Evaluation Patient Details Name: Lynn Dunn MRN: [id number] DOB: [birth year] Today's Date: 03/20/2024  History of Present Illness  Pt is 89 year old presented to [facility] on  03/18/24 for fall. CT chest showed moderate right-sided pleural effusion with compressive atelectasis of the right lung.  Underwent IR guided thoracentesis with removal of 1.1 L of pleural fluid 1/14   PMH - copd, arthritis, OSA, afib, chf, CVA, ckd, pacer.  Pt admitted from [facility]  Clinical Impression  Pt presents to evaluation from ILF, utilizing rollator for household level distances and then scooter for community level distances. Pt presents to evaluation with deficits in mobility, strength, power, activity tolerance, pain, and balance. Pt was able to ambulate short household level distances with AD and no physical assistance (see gait). Further progress limited secondary to pain and fatigue. PT will continue to treat pt while she is admitted. Patient will benefit from continued inpatient follow up therapy, <3 hours/day/       If plan is discharge home, recommend the following: A little help with walking and/or transfers;A little help with bathing/dressing/bathroom;Assist for transportation   Can travel by private vehicle   No    Equipment Recommendations Rolling walker (2 wheels)  Recommendations for Other Services       Functional Status Assessment Patient has had a recent decline in their functional status and demonstrates the ability to make significant improvements in function in a reasonable and predictable amount of time.     Precautions / Restrictions Precautions Precautions: Fall Recall of Precautions/Restrictions: Intact Restrictions Weight Bearing Restrictions Per Provider Order: No      Mobility  Bed Mobility Overal bed mobility: Needs Assistance             General bed mobility comments: pt received sitting EOB and returned to sitting EOB    Transfers Overall  transfer level: Needs assistance Equipment used: Rolling walker (2 wheels) Transfers: Sit to/from Stand Sit to Stand: Contact guard assist           General transfer comment: Pt completed STS from EOB with RW and no physical assistance. VC given for UE placement and sequencing. Increased time to complete.    Ambulation/Gait Ambulation/Gait assistance: Contact guard assist Gait Distance (Feet): 30 Feet Assistive device: Rolling walker (2 wheels) Gait Pattern/deviations: Step-through pattern, Decreased stride length, Knee flexed in stance - right, Knee flexed in stance - left, Trunk flexed Gait velocity: reduced Gait velocity interpretation: <1.8 ft/sec, indicate of risk for recurrent falls   General Gait Details: Pt demonstrates reciprocal gait pattern with increased reliance on RW for improved stability. Pt displays reduced gait speed with anterior trunk lean and tends to ambulate outside of base of walker.  Stairs            Wheelchair Mobility     Tilt Bed    Modified Rankin (Stroke Patients Only)       Balance Overall balance assessment: Needs assistance Sitting-balance support: No upper extremity supported, Feet supported Sitting balance-Leahy Scale: Good Sitting balance - Comments: pt displays posterior trunk lean with MMT Postural control: Posterior lean Standing balance support: Bilateral upper extremity supported, Reliant on assistive device for balance, During functional activity Standing balance-Leahy Scale: Poor Standing balance comment: reliant on external support                             Pertinent Vitals/Pain Pain Assessment Pain Assessment: Faces Faces Pain Scale: Hurts  little more Pain Location: bilateral knees when attempting to stand Pain Descriptors / Indicators: Discomfort, Grimacing, Guarding, Moaning Pain Intervention(s): Limited activity within patient's tolerance, Monitored during session    Home Living Family/patient  expects to be discharged to:: Private residence (ILF at [facility]) Living Arrangements: Alone   Type of Home: Apartment Home Access: Elevator;Level entry       Home Layout: One level Home Equipment: Rollator (4 wheels);Cane - single point;Electric scooter;Grab bars - tub/shower;Grab bars - toilet;Shower seat      Prior Function Prior Level of Function : Independent/Modified Independent;History of Falls (last six months)             Mobility Comments: Pt reports she utilizes rollator in her apartment and uses a scooter to move around the facility ADLs Comments: Pt reports she is independent with ADLs.  She goes to the dining room for all meals. Does not drive     Extremity/Trunk Assessment   Upper Extremity Assessment Upper Extremity Assessment: Defer to OT evaluation    Lower Extremity Assessment Lower Extremity Assessment: Generalized weakness (grossly 4-/5 hip flexion, and 4/5 knee flexion and extension bilaterally; limited secondary to pain)    Cervical / Trunk Assessment Cervical / Trunk Assessment: Kyphotic  Communication   Communication Communication: No apparent difficulties    Cognition Arousal: Alert Behavior During Therapy: WFL for tasks assessed/performed   PT - Cognitive impairments: No apparent impairments                         Following commands: Intact       Cueing Cueing Techniques: Verbal cues, Gestural cues     General Comments General comments (skin integrity, edema, etc.): 3L SpO2 100%, trialed 2L with SpO2 dropping to 86% requiring 3L to remain above 90%    Exercises     Assessment/Plan    PT Assessment Patient needs continued PT services  PT Problem List Decreased strength;Decreased activity tolerance;Decreased range of motion;Decreased balance;Decreased mobility;Pain       PT Treatment Interventions DME instruction;Gait training;Functional mobility training;Therapeutic activities;Balance training;Therapeutic  exercise;Patient/family education;Manual techniques;Wheelchair mobility training;Modalities    PT Goals (Current goals can be found in the Care Plan section)  Acute Rehab PT Goals Patient Stated Goal: to go to rehab PT Goal Formulation: With patient Time For Goal Achievement: 04/03/24 Potential to Achieve Goals: Good    Frequency Min 2X/week     Co-evaluation               AM-PAC PT 6 Clicks Mobility  Outcome Measure Help needed turning from your back to your side while in a flat bed without using bedrails?: A Little Help needed moving from lying on your back to sitting on the side of a flat bed without using bedrails?: A Little Help needed moving to and from a bed to a chair (including a wheelchair)?: A Little Help needed standing up from a chair using your arms (e.g., wheelchair or bedside chair)?: A Little Help needed to walk in hospital room?: A Little Help needed climbing 3-5 steps with a railing? : Total 6 Click Score: 16    End of Session Equipment Utilized During Treatment: Gait belt;Oxygen  Activity Tolerance: Patient tolerated treatment well Patient left: in bed;with call bell/phone within reach;with bed alarm set Nurse Communication: Mobility status PT Visit Diagnosis: Other abnormalities of gait and mobility (R26.89);Muscle weakness (generalized) (M62.81);History of falling (Z91.81);Repeated falls (R29.6)    Time: 9096-9069 PT Time Calculation (min) (ACUTE ONLY): 27  min   Charges:   PT Evaluation $PT Eval Moderate Complexity: 1 Mod   PT General Charges $$ ACUTE PT VISIT: 1 Visit         Leontine Hilt DPT Acute Rehab Services [phone] Prefer contact via chat   Leontine NOVAK Kniyah Khun 03/20/2024, 10:07 AM

## 2024-03-20 NOTE — TOC Initial Note (Addendum)
 Transition of Care [facility]) - Initial/Assessment Note    Patient Details  Name: Lynn Dunn MRN: [id number] Date of Birth: 18-Jan-1931  [facility]) CM/SW Contact:    Isaiah Public, LCSWA Phone Number: 03/20/2024, 11:02 AM  Clinical Narrative:                    CSW received consult for possible SNF placement at time of discharge. CSW spoke with patient at bedside regarding PT recommendation of SNF placement at time of discharge. Patient reports PTA she comes from [facility]. Patient expressed understanding of PT recommendation and is agreeable to SNF placement at time of discharge. Patient would like to receive short term rehab at [facility]. CSW discussed insurance authorization process.Patient expressed being hopeful for rehab and to feel better soon. All questions answered.No further questions reported at this time. CSW to continue to follow and assist with discharge planning needs.   Update- CSW spoke with Bard with [facility] who informed CSW that PTA patient was receiving short term rehab at [facility]. Bard confirmed that patient can return to short term rehab tomorrow if medically ready. CSW  informed MD.   Update- CSW called [facility] admissions and LVM. CSW awaiting call back.   Patient Goals and CMS Choice            Expected Discharge Plan and Services         Expected Discharge Date: 03/20/24                                    Prior Living Arrangements/Services                       Activities of Daily Living      Permission Sought/Granted                  Emotional Assessment              Admission diagnosis:  Pleural effusion [J90] Fall, initial encounter [W19.XXXA] Acute hypoxemic respiratory failure (HCC) [J96.01] Pneumonia due to infectious organism, unspecified laterality, unspecified part of lung [J18.9] Congestive heart failure, unspecified HF chronicity, unspecified heart failure type  [facility]) [I50.9] Patient Active Problem List   Diagnosis Date Noted   Acute hypoxemic respiratory failure (HCC) 03/18/2024   Recurrent right pleural effusion 03/18/2024   Secondary hypercoagulable state 11/13/2019   Diastolic dysfunction 07/11/2018   Acute respiratory failure with hypoxia (HCC) 06/13/2018   Chest pain 06/13/2018   Abnormal EKG 06/13/2018   Airway malacia    Restrictive lung disease 01/18/2017   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/07/2016   COPD (chronic obstructive pulmonary disease) (HCC) 04/05/2016   Subtherapeutic international normalized ratio (INR) 04/05/2016   Atrial fibrillation with rapid ventricular response (HCC) 04/04/2016   Carotid artery disease 11/25/2015   Pre-syncope 02/16/2015   Sick sinus syndrome (HCC) 02/15/2015   Swelling of right lower extremity 08/31/2014   Mixed hyperlipidemia 12/27/2012   Obesity 12/27/2012   GI bleed 01/23/2012   Anemia 01/23/2012   History of CVA (cerebrovascular accident) 01/23/2012   SHORTNESS OF BREATH (SOB) 09/13/2009   Essential hypertension, benign 06/17/2009   Atrial fibrillation (HCC) 06/17/2009   OSA (obstructive sleep apnea) 06/17/2009   PCP:  Melchor Ozell PARAS, MD (Inactive) Pharmacy:   [facility] - [place], [place] - [address] [place] [address] Phone: [phone] Fax: [phone]  [facility] - [place], [place] - [address] [place] [address] Phone: [phone] Fax: [phone]     Social Drivers of Health (SDOH) Social History: SDOH Screenings   Food Insecurity: No Food Insecurity (03/19/2024)  Housing: Low Risk (03/19/2024)  Transportation Needs: No Transportation Needs (03/19/2024)  Utilities: Not At Risk (03/19/2024)  Financial Resource Strain: Low Risk (03/17/2024)   Received from [facility]  Social Connections: Moderately Isolated (03/19/2024)  Stress: Patient Declined (03/12/2024)   Received from [facility]   Tobacco Use: Low Risk (03/18/2024)   SDOH Interventions:     Readmission Risk Interventions     No data to display

## 2024-03-20 NOTE — Plan of Care (Signed)

## 2024-03-20 NOTE — Discharge Summary (Addendum)
 Physician Discharge Summary  JORGINA BINNING FMW:[id number] DOB: 15-Apr-1930 DOA: 03/18/2024  PCP: Melchor Ozell PARAS, MD (Inactive)  Admit date: 03/18/2024 Discharge date: 03/21/2024  Admitted From: ILF Disposition:  SNF  Discharge Condition:Stable CODE STATUS:FULL Diet recommendation: Heart Healthy    Brief/Interim Summary:  Lynn Dunn is a 89 y.o. female with medical history significant of HFpEF, paroxysmal A-fib status post AV nodal ablation status post pacemaker placement, on anticoagulation with warfarin, CKD stage IIIb, CVA, obesity who presented from [facility] facility after she fell.  She was getting up from the bed to use the restroom, she lost balance and then fell.  She hit her head and both knees.  No history of loss of consciousness.  She bruised her left forehead On presentation, she was hemodynamically stable. Lab work showed creatinine of 1.3.  proBNP of 4427.  INR of 2.6.  Chest x-ray showed right-sided pleural effusion with underlying atelectasis versus infiltrate.  CT chest showed moderate right-sided pleural effusion with compressive atelectasis of the right lung.  Cardiology, pulmonology consulted.  Both signed off.  Underwent IR guided thoracentesis with removal of 1.1 L of pleural fluid.  Fluid transudative in nature.  Remains at baseline oxygen  requirement.  PT recommended SNF on dc.  Medically stable for discharge whenever possible.  Following problems were addressed during the hospitalization:   Recurrent right-sided pleural effusion: She was recently discharged from [facility] after the management for the same.  She presented there with dyspnea, found to have right pleural effusion.  Effusion suspected to be from underlying CHF, valvular heart disease.  At that time CT imaging of the chest was obtained which showed large right-sided pleural effusion with compressive atelectasis of the right lower lobe, no definitive infiltrate,  dilatation of pulmonary artery.  She underwent thoracentesis by IR.  Fluid was suggestive of transudative in nature. CT done here shows reaccumulation of fluid.  Underwent IR guided thoracentesis with removal of 1.1 L of pleural fluid.  Gram stain did not show any organism.  Fluid transudative in nature.  Cytology showed mesothelial cells, no evidence of malignancy.   We recommend to  follow-up with cardiology as an outpatient.   Fall: Presented from [facility] living facility.  CT head showed left frontal scalp hematoma,chronic microvascular ischemic changes with chronic lacunar infarct in the left internal capsule and age-related cerebral volume loss.  CT cervical spine did not show any acute findings.  Consulted PT/OT.  Ambulates with the help of cane, rollator.  PT recommended SNF on discharge   Community-acquired pneumonia?: Questionable.  Chest CT showed subtle hazy centrilobular attenuation over the right upper lobe which could be seen with an infectious process versus atypical infectious/inflammatory process.  Do not think this is contributing to her pneumonia.  Will continue Augmentin  for now to complete 5 days course.   Chronic hypoxic respiratory failure: On 2 to 3 L of oxygen  at her facility.  Currently on the same.   Paroxysmal A-fib: History of AV nodal ablation.  Follows with cardiology at [facility].  On warfarin for anticoagulation.  Status post pacemaker placement History of severe tricuspid valve disease, pulmonary hypertension, moderate MVR.  Remains in normal sinus rhythm   History of HFpEF: Presented with pleural effusion.  Elevated BNP.  Will consult cardiology.  Recent echocardiogram on 02/14/2024 showed EF of 50 to 55%, normal wall motion, moderate to severe tricuspid valve regurgitation.  Takes Jardiance , Lasix .  Cardiology consulted here.  She follows with [facility] cardiology.  Lasix  resumed   Hyperlipidemia: On statin   CKD stage IIIb: Chronic and function at baseline    History of CVA/deconditioning/debility: Lives at a facility.  Continue statin.  Ambulates with the help of cane, rollator.  PT recommended SNF   Discharge Diagnoses:  Principal Problem:   Recurrent right pleural effusion Active Problems:   Atrial fibrillation (HCC)   SHORTNESS OF BREATH (SOB)   History of CVA (cerebrovascular accident)   Mixed hyperlipidemia   Obesity   Acute hypoxemic respiratory failure [facility])    Discharge Instructions  Discharge Instructions     Discharge instructions   Complete by: As directed    1)Please take your medications as instructed 2)Follow up with your PCP in a week 3)Follow up with your cardiologist as an outpatient   Increase activity slowly   Complete by: As directed    No wound care   Complete by: As directed       Allergies as of 03/21/2024       Reactions   Ace Inhibitors Cough   Ventolin  [albuterol ] Palpitations, Other (See Comments)   Triggers A-Fib   Oysters [shellfish Allergy] Nausea And Vomiting   Sulfa Antibiotics Rash        Medication List     TAKE these medications    amoxicillin -clavulanate 500-125 MG tablet Commonly known as: AUGMENTIN  Take 1 tablet by mouth 2 (two) times daily with a meal for 3 days.   calcitRIOL 0.25 MCG capsule Commonly known as: ROCALTROL Take 0.25 mcg by mouth daily.   Cholecalciferol  25 MCG (1000 UT) tablet Take 1,000 Units by mouth daily.   furosemide  40 MG tablet Commonly known as: LASIX  Take 40 mg by mouth 2 (two) times daily.   Hycodan 5-1.5 MG/5ML syrup Generic drug: HYDROcodone  bit-homatropine Take 5 mLs by mouth every 12 (twelve) hours as needed for cough.   Jardiance  10 MG Tabs tablet Generic drug: empagliflozin  Take 10 mg by mouth daily.   Magnesium  100 MG Caps Take 100 mg by mouth daily at 12 noon.   meclizine 25 MG tablet Commonly known as: ANTIVERT Take 25 mg by mouth every 12 (twelve) hours as needed for dizziness.   mupirocin ointment 2 % Commonly known  as: BACTROBAN Apply 1 Application topically See admin instructions. Apply  to nasal topically times a day for nasal health for 14 days.   ondansetron  4 MG disintegrating tablet Commonly known as: ZOFRAN -ODT Take 4 mg by mouth every 8 (eight) hours as needed for nausea or vomiting.   OZEMPIC (0.25 OR 0.5 MG/DOSE) [place] Inject 0.25 mg into the skin every Sunday.   polyethylene glycol 17 g packet Commonly known as: MIRALAX  / GLYCOLAX  Take 17 g by mouth daily.   potassium chloride  10 MEQ tablet Commonly known as: KLOR-CON  Take 10 mEq by mouth daily.   rosuvastatin  10 MG tablet Commonly known as: CRESTOR  Take 10 mg by mouth at bedtime.   TURMERIC (CURCUMIN) PO Take 500 mg by mouth 2 (two) times daily.   warfarin 3 MG tablet Commonly known as: COUMADIN  Take 3 mg by mouth every evening.        Follow-up Information     Melchor Ozell PARAS, MD. Schedule an appointment as soon as possible for a visit in 1 week(s).   Specialty: Internal Medicine Contact information: [address] [place] [address] [phone]                Allergies[1]  Consultations: Cardiology, PCCM   Procedures/Studies: IR THORACENTESIS  RIGHT ASP PLEURAL SPACE W/IMG GUIDE Result Date: 03/19/2024 INDICATION: Hx afib, mild aortic stenosis, severe tricuspid regurgitation. Found to have recurrent right pleural effusion. Request for diagnostic and therapeutic right thoracentesis. EXAM: ULTRASOUND GUIDED DIAGNOSTIC AND THERAPEUTIC RIGHT THORACENTESIS MEDICATIONS: 8 mL 1% lidocaine  COMPLICATIONS: None immediate. PROCEDURE: An ultrasound guided thoracentesis was thoroughly discussed with the patient and questions answered. The benefits, risks, alternatives and complications were also discussed. The patient understands and wishes to proceed with the procedure. Written consent was obtained. Ultrasound was performed to localize and mark an adequate pocket of fluid in the RIGHT chest. The area was then  prepped and draped in the normal sterile fashion. 1% Lidocaine  was used for local anesthesia. Under ultrasound guidance a 6 Fr Safe-T-Centesis catheter was introduced. Thoracentesis was performed. The catheter was removed and a dressing applied. FINDINGS: A total of approximately 1.1 liters of slightly hazy yellow fluid was removed. Samples were sent to the laboratory as requested by the clinical team. IMPRESSION: Successful ultrasound guided RIGHT thoracentesis yielding 1.1 L of pleural fluid. Performed and dictated by Kimble Clas, PA-C under supervision of Thom Hall, MD Electronically Signed   By: Thom Hall M.D.   On: 03/19/2024 12:50   DG Chest Port 1 View Result Date: 03/19/2024 CLINICAL DATA:  Status post right thoracentesis EXAM: PORTABLE CHEST 1 VIEW COMPARISON:  Same day FINDINGS: Right pleural effusion is significantly smaller status post thoracentesis. No definite pneumothorax is noted. IMPRESSION: Significantly smaller right pleural effusion status post thoracentesis. No definite pneumothorax is noted. Electronically Signed   By: Lynwood Landy Raddle M.D.   On: 03/19/2024 09:34   DG CHEST PORT 1 VIEW Result Date: 03/19/2024 EXAM: 1 VIEW(S) XRAY OF THE CHEST 03/19/2024 07:52:32 AM COMPARISON: 03/18/2024 CLINICAL HISTORY: Pleural effusion. ICD10: 857769 - Pleural effusion. FINDINGS: LINES, TUBES AND DEVICES: Left chest cardiac pacing device noted. LUNGS AND PLEURA: Low lung volumes. Similar mild pulmonary edema. Stable moderate right pleural effusion with overlying right basilar opacities. No pneumothorax. HEART AND MEDIASTINUM: Cardiomegaly, unchanged. Atherosclerotic calcifications noted. BONES AND SOFT TISSUES: No acute osseous abnormality. IMPRESSION: 1. Stable moderate right pleural effusion with overlying right basilar opacities. 2. Similar mild pulmonary edema. 3. Unchanged cardiomegaly with left chest cardiac pacing device and atherosclerotic calcifications. Electronically signed by: Evalene Coho MD 03/19/2024 07:54 AM EST RP Workstation: [id number]   IR US  CHEST Result Date: 03/18/2024 INDICATION: Patient with right moderate pleural effusion with request for diagnostic and therapeutic thoracentesis. Recently underwent thoracentesis at [facility] this week with labs drawn at that time. EXAM: CHEST ULTRASOUND COMPARISON:  03/18/24 CT Chest FINDINGS: Moderate right-sided pleural effusion. Pre-procedural US  imaging absolutely supports moderate amount of fluid; however, lung flap present close with borderline window for puncture. Risks outweigh the benefits. IMPRESSION: Moderate right-sided pleural effusion with poor window for percutaneous access. Recommend bringing patient back down tomorrow to see if any increase in fluid and improved window for puncture given labs previously drawn at puncture by [facility] within the last week and that fluid accumulates so quickly. Performed by Laymon Coast, NP Electronically Signed   By: Juliene Balder M.D.   On: 03/18/2024 14:25   CT CHEST WO CONTRAST Addendum Date: 03/18/2024 ADDENDUM REPORT: 03/18/2024 10:19 ADDENDUM: Note that there is stable minimal bilateral pleural calcification which may be due to prior trauma versus asbestos related pleural disease. Electronically Signed   By: Toribio Agreste M.D.   On: 03/18/2024 10:19   Result Date: 03/18/2024 CLINICAL DATA:  Possible pleural effusion. Pneumonia. Shortness of  breath. EXAM: CT CHEST WITHOUT CONTRAST TECHNIQUE: Multidetector CT imaging of the chest was performed following the standard protocol without IV contrast. RADIATION DOSE REDUCTION: This exam was performed according to the departmental dose-optimization program which includes automated exposure control, adjustment of the mA and/or kV according to patient size and/or use of iterative reconstruction technique. COMPARISON:  04/12/2022 FINDINGS: Cardiovascular: Mild stable cardiomegaly. Left-sided cardiac pacemaker is present. Moderate calcified plaque  over the mitral valve annulus. Calcified plaque over the left main and 3 vessel atherosclerotic coronary arteries. Ascending thoracic aorta measures 3.9 cm unchanged. There is calcified plaque throughout the thoracic aorta. Pulmonary arterial system is unremarkable on this noncontrast exam. Remaining vascular structures are unchanged. Mediastinum/Nodes: No evidence of mediastinal or hilar adenopathy on this noncontrast exam. Remaining mediastinal structures are unremarkable. Lungs/Pleura: Lungs are adequately inflated. There is a new moderate size right pleural effusion with associated compressive atelectasis in the right base. Mild scarring and atelectasis over the right middle lobe. There is subtle hazy centrilobular attenuation over the right upper lobe which could be seen with an infectious process versus atypical infectious/inflammatory process. Minimal linear scarring over the lingula/left upper lobe. Airways are unremarkable. Upper Abdomen: Images through the upper abdomen demonstrate mild nodular contour of the liver which can be seen with cirrhosis. Calcified plaque over the abdominal aorta which is normal in caliber. Diverticulosis of the colon. Musculoskeletal: Moderate degenerative changes of the spine. IMPRESSION: 1. New moderate size right pleural effusion with associated compressive atelectasis in the right base. 2. Subtle hazy centrilobular attenuation over the right upper lobe which could be seen with an infectious process versus atypical infectious/inflammatory process. 3. Stable mild cardiomegaly. Atherosclerotic vascular disease including the left main and 3 vessel coronary arteries. 4. Mild nodular contour of the liver which can be seen with cirrhosis. 5. Diverticulosis of the colon. 6. Aortic atherosclerosis. Aortic Atherosclerosis (ICD10-I70.0). Electronically Signed: By: Toribio Agreste M.D. On: 03/18/2024 09:14   CT Cervical Spine Wo Contrast Result Date: 03/18/2024 EXAM: CT CERVICAL SPINE  WITHOUT CONTRAST 03/18/2024 03:57:52 AM TECHNIQUE: CT of the cervical spine was performed without the administration of intravenous contrast. Multiplanar reformatted images are provided for review. Automated exposure control, iterative reconstruction, and/or weight based adjustment of the mA/kV was utilized to reduce the radiation dose to as low as reasonably achievable. COMPARISON: CT of the cervical spine dated 04/12/2022. CLINICAL HISTORY: Polytrauma, blunt. FINDINGS: BONES AND ALIGNMENT: Mild straightening of the normal cervical lordosis, as before. No acute fracture or traumatic malalignment. DEGENERATIVE CHANGES: Diffuse chronic degenerative disc disease and facet arthrosis throughout the cervical spine. SOFT TISSUES: No prevertebral soft tissue swelling. There is extensive calcific atheromatous disease, particularly involving the carotid bulbs. LUNGS: There is moderate right-sided pleural effusion. IMPRESSION: 1. No evidence of acute traumatic injury. 2. Mild straightening of the normal cervical lordosis, as before. 3. Diffuse chronic degenerative disc disease and facet arthrosis throughout the cervical spine. 4. Extensive calcific atheromatous disease, particularly involving the carotid bulbs. 5. Moderate right pleural effusion. Electronically signed by: Evalene Coho MD MD 03/18/2024 04:17 AM EST RP Workstation: [id number]   CT Head Wo Contrast Result Date: 03/18/2024 EXAM: CT HEAD WITHOUT CONTRAST 03/18/2024 03:57:52 AM TECHNIQUE: CT of the head was performed without the administration of intravenous contrast. Automated exposure control, iterative reconstruction, and/or weight based adjustment of the mA/kV was utilized to reduce the radiation dose to as low as reasonably achievable. COMPARISON: 10/24/2023 CLINICAL HISTORY: Polytrauma, blunt. FINDINGS: BRAIN AND VENTRICLES: No acute hemorrhage. No evidence of acute infarct.  Age-related cerebral volume loss. Moderate periventricular and deep cerebral  white matter disease. Chronic lacunar infarct within the left internal capsule. No hydrocephalus. No extra-axial collection. No mass effect or midline shift. ORBITS: Bilateral lens replacement. SINUSES: No acute abnormality. SOFT TISSUES AND SKULL: Left frontal scalp hematoma. No skull fracture. VASCULATURE: Moderate calcific atheromatous disease within the carotid siphons and vertebral arteries. IMPRESSION: 1. No acute intracranial hemorrhage, mass effect, or acute territorial infarct related to polytrauma. 2. Left frontal scalp hematoma. 3. Chronic microvascular ischemic changes with chronic lacunar infarct in the left internal capsule and age-related cerebral volume loss. Electronically signed by: Evalene Coho MD MD 03/18/2024 04:13 AM EST RP Workstation: [id number]   DG Chest Portable 1 View Result Date: 03/18/2024 EXAM: 1 VIEW(S) XRAY OF THE CHEST 03/18/2024 03:55:16 AM COMPARISON: 08/11/2022 CLINICAL HISTORY: fallonthinners FINDINGS: LINES, TUBES AND DEVICES: Left chest cardiac device. LUNGS AND PLEURA: Mild vascular congestion is noted. New right-sided pleural effusion is noted with underlying atelectasis/infiltrate. Left lung remains clear. No pneumothorax. HEART AND MEDIASTINUM: Cardiomegaly. Aortic atherosclerosis. BONES AND SOFT TISSUES: No acute osseous abnormality. IMPRESSION: 1. New right-sided pleural effusion with underlying atelectasis or infiltrate. Electronically signed by: Oneil Devonshire MD MD 03/18/2024 04:00 AM EST RP Workstation: [id number]      Subjective: Patient seen and examined at bedside today.  Hemodynamically stable.  Comfortable.  On 2 L of oxygen  per minute which is her baseline.  Denies any worsening shortness of breath or cough.  Remains in normal sinus rhythm.  Stable blood pressure.  Medically stable for discharge whenever possible.  Discharge Exam: Vitals:   03/21/24 0427 03/21/24 0854  BP: (!) 143/62 133/84  Pulse: 74 78  Resp: 19 20  Temp: 98.2 F (36.8 C)  98.6 F (37 C)  SpO2: 96% 99%   Vitals:   03/20/24 2100 03/21/24 0100 03/21/24 0427 03/21/24 0854  BP: 139/69 (!) 133/56 (!) 143/62 133/84  Pulse: 77 78 74 78  Resp: 18 18 19 20   Temp: 97.9 F (36.6 C) 98.3 F (36.8 C) 98.2 F (36.8 C) 98.6 F (37 C)  TempSrc: Oral Oral Oral Oral  SpO2: 99% 97% 96% 99%  Weight:   71.2 kg   Height:        General: Pt is alert, awake, not in acute distress, pleasant elderly female, obese Cardiovascular: RRR, S1/S2 +, no rubs, no gallops Respiratory: CTA bilaterally, no wheezing, no rhonchi Abdominal: Soft, NT, ND, bowel sounds + Extremities: no edema, no cyanosis    The results of significant diagnostics from this hospitalization (including imaging, microbiology, ancillary and laboratory) are listed below for reference.     Microbiology: Recent Results (from the past 240 hours)  Blood culture (routine x 2)     Status: None (Preliminary result)   Collection Time: 03/18/24  4:36 AM   Specimen: BLOOD  Result Value Ref Range Status   Specimen Description BLOOD RIGHT ANTECUBITAL  Final   Special Requests   Final    BOTTLES DRAWN AEROBIC AND ANAEROBIC Blood Culture results may not be optimal due to an inadequate volume of blood received in culture bottles   Culture   Final    NO GROWTH 3 DAYS Performed at [facility], [address], [place], [place] [address]    Report Status PENDING  Incomplete  Blood culture (routine x 2)     Status: None (Preliminary result)   Collection Time: 03/18/24  4:56 AM   Specimen: BLOOD  Result Value Ref Range Status  Specimen Description BLOOD RIGHT ANTECUBITAL  Final   Special Requests   Final    BOTTLES DRAWN AEROBIC AND ANAEROBIC Blood Culture adequate volume   Culture   Final    NO GROWTH 3 DAYS Performed at [facility], [address] [place], [place] [address]    Report Status PENDING  Incomplete  Culture, body fluid w Gram Stain-bottle     Status: None (Preliminary result)    Collection Time: 03/19/24  9:11 AM   Specimen: Pleura  Result Value Ref Range Status   Specimen Description PLEURAL  Final   Special Requests LUNG,RIGHT  Final   Culture   Final    NO GROWTH 2 DAYS Performed at [facility], [address], [place], [place] [address]    Report Status PENDING  Incomplete  Gram stain     Status: None   Collection Time: 03/19/24  9:11 AM   Specimen: Pleura  Result Value Ref Range Status   Specimen Description PLEURAL  Final   Special Requests LUNG,RIGHT  Final   Gram Stain   Final    FEW WBC PRESENT, PREDOMINANTLY MONONUCLEAR NO ORGANISMS SEEN Performed at [facility], [address], [place], [place] [address]    Report Status 03/19/2024 FINAL  Final     Labs: BNP (last 3 results) No results for input(s): BNP in the last 8760 hours. Basic Metabolic Panel: Recent Labs  Lab 03/18/24 0342 03/18/24 0346 03/19/24 0541 03/20/24 0837 03/21/24 0322  NA 141 141 138 142 138  K 3.9 3.7 3.9 3.8 3.8  CL 102 98 99 101 98  CO2 29  --  30 34* 31  GLUCOSE 125* 124* 111* 93 95  BUN 28* 30* 24* 22 25*  CREATININE 1.34* 1.50* 1.25* 1.27* 1.28*  CALCIUM  9.1  --  8.9 8.2* 8.6*   Liver Function Tests: No results for input(s): AST, ALT, ALKPHOS, BILITOT, PROT, ALBUMIN in the last 168 hours. No results for input(s): LIPASE, AMYLASE in the last 168 hours. No results for input(s): AMMONIA in the last 168 hours. CBC: Recent Labs  Lab 03/18/24 0342 03/18/24 0346 03/19/24 0541 03/20/24 0837 03/21/24 0322  WBC 10.2  --  9.0 8.3 8.0  NEUTROABS 7.3  --   --   --   --   HGB 12.5 13.9 12.5 11.3* 11.1*  HCT 40.3 41.0 40.4 35.9* 35.3*  MCV 93.1  --  92.9 91.6 91.5  PLT 214  --  208 201 198   Cardiac Enzymes: No results for input(s): CKTOTAL, CKMB, CKMBINDEX, TROPONINI in the last 168 hours. BNP: Invalid input(s): POCBNP CBG: No results for input(s): GLUCAP in the last 168 hours. D-Dimer No results for  input(s): DDIMER in the last 72 hours. Hgb A1c No results for input(s): HGBA1C in the last 72 hours. Lipid Profile No results for input(s): CHOL, HDL, LDLCALC, TRIG, CHOLHDL, LDLDIRECT in the last 72 hours. Thyroid  function studies No results for input(s): TSH, T4TOTAL, T3FREE, THYROIDAB in the last 72 hours.  Invalid input(s): FREET3 Anemia work up No results for input(s): VITAMINB12, FOLATE, FERRITIN, TIBC, IRON, RETICCTPCT in the last 72 hours. Urinalysis    Component Value Date/Time   COLORURINE YELLOW 01/11/2019 1800   APPEARANCEUR CLEAR 01/11/2019 1800   LABSPEC <1.005 (L) 01/11/2019 1800   PHURINE 7.5 01/11/2019 1800   GLUCOSEU NEGATIVE 01/11/2019 1800   HGBUR NEGATIVE 01/11/2019 1800   BILIRUBINUR NEGATIVE 01/11/2019 1800   KETONESUR NEGATIVE 01/11/2019 1800  PROTEINUR NEGATIVE 01/11/2019 1800   UROBILINOGEN 0.2 05/13/2012 0136   NITRITE NEGATIVE 01/11/2019 1800   LEUKOCYTESUR TRACE (A) 01/11/2019 1800   Sepsis Labs Recent Labs  Lab 03/18/24 0342 03/19/24 0541 03/20/24 0837 03/21/24 0322  WBC 10.2 9.0 8.3 8.0   Microbiology Recent Results (from the past 240 hours)  Blood culture (routine x 2)     Status: None (Preliminary result)   Collection Time: 03/18/24  4:36 AM   Specimen: BLOOD  Result Value Ref Range Status   Specimen Description BLOOD RIGHT ANTECUBITAL  Final   Special Requests   Final    BOTTLES DRAWN AEROBIC AND ANAEROBIC Blood Culture results may not be optimal due to an inadequate volume of blood received in culture bottles   Culture   Final    NO GROWTH 3 DAYS Performed at [facility], [address], [place], [place] [address]    Report Status PENDING  Incomplete  Blood culture (routine x 2)     Status: None (Preliminary result)   Collection Time: 03/18/24  4:56 AM   Specimen: BLOOD  Result Value Ref Range Status   Specimen Description BLOOD RIGHT ANTECUBITAL  Final   Special Requests   Final     BOTTLES DRAWN AEROBIC AND ANAEROBIC Blood Culture adequate volume   Culture   Final    NO GROWTH 3 DAYS Performed at [facility], [address], [place], [place] [address]    Report Status PENDING  Incomplete  Culture, body fluid w Gram Stain-bottle     Status: None (Preliminary result)   Collection Time: 03/19/24  9:11 AM   Specimen: Pleura  Result Value Ref Range Status   Specimen Description PLEURAL  Final   Special Requests LUNG,RIGHT  Final   Culture   Final    NO GROWTH 2 DAYS Performed at [facility], [address], [place], [place] [address]    Report Status PENDING  Incomplete  Gram stain     Status: None   Collection Time: 03/19/24  9:11 AM   Specimen: Pleura  Result Value Ref Range Status   Specimen Description PLEURAL  Final   Special Requests LUNG,RIGHT  Final   Gram Stain   Final    FEW WBC PRESENT, PREDOMINANTLY MONONUCLEAR NO ORGANISMS SEEN Performed at [facility], [address] [place], [place] [address]    Report Status 03/19/2024 FINAL  Final    Please note: You were cared for by a hospitalist during your hospital stay. Once you are discharged, your primary care physician will handle any further medical issues. Please note that NO REFILLS for any discharge medications will be authorized once you are discharged, as it is imperative that you return to your primary care physician (or establish a relationship with a primary care physician if you do not have one) for your post hospital discharge needs so that they can reassess your need for medications and monitor your lab values.    Time coordinating discharge: 40 minutes  SIGNED:   Ivonne Mustache, MD  [facility] 03/21/2024, 10:39 AM Pager [phone]  If 7PM-7AM, please contact night-coverage [email or web address] [facility] Physician Discharge Summary  Lynn Dunn FMW:[id number] DOB: 04/19/1930 DOA: 03/18/2024  PCP: Melchor Ozell PARAS, MD (Inactive)  Admit date:  03/18/2024 Discharge date: 03/21/2024  Admitted From: Home Disposition:  Home  Discharge Condition:Stable CODE STATUS:FULL, DNR, Comfort Care Diet recommendation: Heart Healthy / Carb Modified / Regular / Dysphagia  Brief/Interim Summary:   Following problems were addressed during the hospitalization:   Discharge Diagnoses:  Principal Problem:   Recurrent right pleural effusion Active Problems:   Atrial fibrillation (HCC)   SHORTNESS OF BREATH (SOB)   History of CVA (cerebrovascular accident)   Mixed hyperlipidemia   Obesity   Acute hypoxemic respiratory failure [facility])    Discharge Instructions  Discharge Instructions     Discharge instructions   Complete by: As directed    1)Please take your medications as instructed 2)Follow up with your PCP in a week 3)Follow up with your cardiologist as an outpatient   Increase activity slowly   Complete by: As directed    No wound care   Complete by: As directed       Allergies as of 03/21/2024       Reactions   Ace Inhibitors Cough   Ventolin  [albuterol ] Palpitations, Other (See Comments)   Triggers A-Fib   Oysters [shellfish Allergy] Nausea And Vomiting   Sulfa Antibiotics Rash        Medication List     TAKE these medications    amoxicillin -clavulanate 500-125 MG tablet Commonly known as: AUGMENTIN  Take 1 tablet by mouth 2 (two) times daily with a meal for 3 days.   calcitRIOL 0.25 MCG capsule Commonly known as: ROCALTROL Take 0.25 mcg by mouth daily.   Cholecalciferol  25 MCG (1000 UT) tablet Take 1,000 Units by mouth daily.   furosemide  40 MG tablet Commonly known as: LASIX  Take 40 mg by mouth 2 (two) times daily.   Hycodan 5-1.5 MG/5ML syrup Generic drug: HYDROcodone  bit-homatropine Take 5 mLs by mouth every 12 (twelve) hours as needed for cough.   Jardiance  10 MG Tabs tablet Generic drug: empagliflozin  Take 10 mg by mouth daily.   Magnesium  100 MG Caps Take 100 mg by mouth daily at 12  noon.   meclizine 25 MG tablet Commonly known as: ANTIVERT Take 25 mg by mouth every 12 (twelve) hours as needed for dizziness.   mupirocin ointment 2 % Commonly known as: BACTROBAN Apply 1 Application topically See admin instructions. Apply  to nasal topically times a day for nasal health for 14 days.   ondansetron  4 MG disintegrating tablet Commonly known as: ZOFRAN -ODT Take 4 mg by mouth every 8 (eight) hours as needed for nausea or vomiting.   OZEMPIC (0.25 OR 0.5 MG/DOSE)  Inject 0.25 mg into the skin every Sunday.   polyethylene glycol 17 g packet Commonly known as: MIRALAX  / GLYCOLAX  Take 17 g by mouth daily.   potassium chloride  10 MEQ tablet Commonly known as: KLOR-CON  Take 10 mEq by mouth daily.   rosuvastatin  10 MG tablet Commonly known as: CRESTOR  Take 10 mg by mouth at bedtime.   TURMERIC (CURCUMIN) PO Take 500 mg by mouth 2 (two) times daily.   warfarin 3 MG tablet Commonly known as: COUMADIN  Take 3 mg by mouth every evening.        Follow-up Information     Melchor Ozell PARAS, MD. Schedule an appointment as soon as possible for a visit in 1 week(s).   Specialty: Internal Medicine Contact information: [address] [place] [address] [phone]                Allergies[2]  Consultations:    Procedures/Studies: IR THORACENTESIS RIGHT ASP PLEURAL SPACE W/IMG GUIDE Result Date: 03/19/2024 INDICATION: Hx afib, mild aortic stenosis, severe tricuspid regurgitation. Found to have recurrent right pleural effusion. Request for diagnostic and therapeutic right thoracentesis.  EXAM: ULTRASOUND GUIDED DIAGNOSTIC AND THERAPEUTIC RIGHT THORACENTESIS MEDICATIONS: 8 mL 1% lidocaine  COMPLICATIONS: None immediate. PROCEDURE: An ultrasound guided thoracentesis was thoroughly discussed with the patient and questions answered. The benefits, risks, alternatives and complications were also discussed. The patient understands and wishes to proceed with the  procedure. Written consent was obtained. Ultrasound was performed to localize and mark an adequate pocket of fluid in the RIGHT chest. The area was then prepped and draped in the normal sterile fashion. 1% Lidocaine  was used for local anesthesia. Under ultrasound guidance a 6 Fr Safe-T-Centesis catheter was introduced. Thoracentesis was performed. The catheter was removed and a dressing applied. FINDINGS: A total of approximately 1.1 liters of slightly hazy yellow fluid was removed. Samples were sent to the laboratory as requested by the clinical team. IMPRESSION: Successful ultrasound guided RIGHT thoracentesis yielding 1.1 L of pleural fluid. Performed and dictated by Kimble Clas, PA-C under supervision of Thom Hall, MD Electronically Signed   By: Thom Hall M.D.   On: 03/19/2024 12:50   DG Chest Port 1 View Result Date: 03/19/2024 CLINICAL DATA:  Status post right thoracentesis EXAM: PORTABLE CHEST 1 VIEW COMPARISON:  Same day FINDINGS: Right pleural effusion is significantly smaller status post thoracentesis. No definite pneumothorax is noted. IMPRESSION: Significantly smaller right pleural effusion status post thoracentesis. No definite pneumothorax is noted. Electronically Signed   By: Lynwood Landy Raddle M.D.   On: 03/19/2024 09:34   DG CHEST PORT 1 VIEW Result Date: 03/19/2024 EXAM: 1 VIEW(S) XRAY OF THE CHEST 03/19/2024 07:52:32 AM COMPARISON: 03/18/2024 CLINICAL HISTORY: Pleural effusion. ICD10: 857769 - Pleural effusion. FINDINGS: LINES, TUBES AND DEVICES: Left chest cardiac pacing device noted. LUNGS AND PLEURA: Low lung volumes. Similar mild pulmonary edema. Stable moderate right pleural effusion with overlying right basilar opacities. No pneumothorax. HEART AND MEDIASTINUM: Cardiomegaly, unchanged. Atherosclerotic calcifications noted. BONES AND SOFT TISSUES: No acute osseous abnormality. IMPRESSION: 1. Stable moderate right pleural effusion with overlying right basilar opacities. 2. Similar  mild pulmonary edema. 3. Unchanged cardiomegaly with left chest cardiac pacing device and atherosclerotic calcifications. Electronically signed by: Evalene Coho MD 03/19/2024 07:54 AM EST RP Workstation: [id number]   IR US  CHEST Result Date: 03/18/2024 INDICATION: Patient with right moderate pleural effusion with request for diagnostic and therapeutic thoracentesis. Recently underwent thoracentesis at [facility] this week with labs drawn at that time. EXAM: CHEST ULTRASOUND COMPARISON:  03/18/24 CT Chest FINDINGS: Moderate right-sided pleural effusion. Pre-procedural US  imaging absolutely supports moderate amount of fluid; however, lung flap present close with borderline window for puncture. Risks outweigh the benefits. IMPRESSION: Moderate right-sided pleural effusion with poor window for percutaneous access. Recommend bringing patient back down tomorrow to see if any increase in fluid and improved window for puncture given labs previously drawn at puncture by [facility] within the last week and that fluid accumulates so quickly. Performed by Laymon Coast, NP Electronically Signed   By: Juliene Balder M.D.   On: 03/18/2024 14:25   CT CHEST WO CONTRAST Addendum Date: 03/18/2024 ADDENDUM REPORT: 03/18/2024 10:19 ADDENDUM: Note that there is stable minimal bilateral pleural calcification which may be due to prior trauma versus asbestos related pleural disease. Electronically Signed   By: Toribio Agreste M.D.   On: 03/18/2024 10:19   Result Date: 03/18/2024 CLINICAL DATA:  Possible pleural effusion. Pneumonia. Shortness of breath. EXAM: CT CHEST WITHOUT CONTRAST TECHNIQUE: Multidetector CT imaging of the chest was performed following the standard protocol without IV contrast. RADIATION DOSE REDUCTION: This exam was performed according to the  departmental dose-optimization program which includes automated exposure control, adjustment of the mA and/or kV according to patient size and/or use of iterative reconstruction  technique. COMPARISON:  04/12/2022 FINDINGS: Cardiovascular: Mild stable cardiomegaly. Left-sided cardiac pacemaker is present. Moderate calcified plaque over the mitral valve annulus. Calcified plaque over the left main and 3 vessel atherosclerotic coronary arteries. Ascending thoracic aorta measures 3.9 cm unchanged. There is calcified plaque throughout the thoracic aorta. Pulmonary arterial system is unremarkable on this noncontrast exam. Remaining vascular structures are unchanged. Mediastinum/Nodes: No evidence of mediastinal or hilar adenopathy on this noncontrast exam. Remaining mediastinal structures are unremarkable. Lungs/Pleura: Lungs are adequately inflated. There is a new moderate size right pleural effusion with associated compressive atelectasis in the right base. Mild scarring and atelectasis over the right middle lobe. There is subtle hazy centrilobular attenuation over the right upper lobe which could be seen with an infectious process versus atypical infectious/inflammatory process. Minimal linear scarring over the lingula/left upper lobe. Airways are unremarkable. Upper Abdomen: Images through the upper abdomen demonstrate mild nodular contour of the liver which can be seen with cirrhosis. Calcified plaque over the abdominal aorta which is normal in caliber. Diverticulosis of the colon. Musculoskeletal: Moderate degenerative changes of the spine. IMPRESSION: 1. New moderate size right pleural effusion with associated compressive atelectasis in the right base. 2. Subtle hazy centrilobular attenuation over the right upper lobe which could be seen with an infectious process versus atypical infectious/inflammatory process. 3. Stable mild cardiomegaly. Atherosclerotic vascular disease including the left main and 3 vessel coronary arteries. 4. Mild nodular contour of the liver which can be seen with cirrhosis. 5. Diverticulosis of the colon. 6. Aortic atherosclerosis. Aortic Atherosclerosis  (ICD10-I70.0). Electronically Signed: By: Toribio Agreste M.D. On: 03/18/2024 09:14   CT Cervical Spine Wo Contrast Result Date: 03/18/2024 EXAM: CT CERVICAL SPINE WITHOUT CONTRAST 03/18/2024 03:57:52 AM TECHNIQUE: CT of the cervical spine was performed without the administration of intravenous contrast. Multiplanar reformatted images are provided for review. Automated exposure control, iterative reconstruction, and/or weight based adjustment of the mA/kV was utilized to reduce the radiation dose to as low as reasonably achievable. COMPARISON: CT of the cervical spine dated 04/12/2022. CLINICAL HISTORY: Polytrauma, blunt. FINDINGS: BONES AND ALIGNMENT: Mild straightening of the normal cervical lordosis, as before. No acute fracture or traumatic malalignment. DEGENERATIVE CHANGES: Diffuse chronic degenerative disc disease and facet arthrosis throughout the cervical spine. SOFT TISSUES: No prevertebral soft tissue swelling. There is extensive calcific atheromatous disease, particularly involving the carotid bulbs. LUNGS: There is moderate right-sided pleural effusion. IMPRESSION: 1. No evidence of acute traumatic injury. 2. Mild straightening of the normal cervical lordosis, as before. 3. Diffuse chronic degenerative disc disease and facet arthrosis throughout the cervical spine. 4. Extensive calcific atheromatous disease, particularly involving the carotid bulbs. 5. Moderate right pleural effusion. Electronically signed by: Evalene Coho MD MD 03/18/2024 04:17 AM EST RP Workstation: [id number]   CT Head Wo Contrast Result Date: 03/18/2024 EXAM: CT HEAD WITHOUT CONTRAST 03/18/2024 03:57:52 AM TECHNIQUE: CT of the head was performed without the administration of intravenous contrast. Automated exposure control, iterative reconstruction, and/or weight based adjustment of the mA/kV was utilized to reduce the radiation dose to as low as reasonably achievable. COMPARISON: 10/24/2023 CLINICAL HISTORY: Polytrauma,  blunt. FINDINGS: BRAIN AND VENTRICLES: No acute hemorrhage. No evidence of acute infarct. Age-related cerebral volume loss. Moderate periventricular and deep cerebral white matter disease. Chronic lacunar infarct within the left internal capsule. No hydrocephalus. No extra-axial collection. No mass effect or midline shift.  ORBITS: Bilateral lens replacement. SINUSES: No acute abnormality. SOFT TISSUES AND SKULL: Left frontal scalp hematoma. No skull fracture. VASCULATURE: Moderate calcific atheromatous disease within the carotid siphons and vertebral arteries. IMPRESSION: 1. No acute intracranial hemorrhage, mass effect, or acute territorial infarct related to polytrauma. 2. Left frontal scalp hematoma. 3. Chronic microvascular ischemic changes with chronic lacunar infarct in the left internal capsule and age-related cerebral volume loss. Electronically signed by: Evalene Coho MD MD 03/18/2024 04:13 AM EST RP Workstation: [id number]   DG Chest Portable 1 View Result Date: 03/18/2024 EXAM: 1 VIEW(S) XRAY OF THE CHEST 03/18/2024 03:55:16 AM COMPARISON: 08/11/2022 CLINICAL HISTORY: fallonthinners FINDINGS: LINES, TUBES AND DEVICES: Left chest cardiac device. LUNGS AND PLEURA: Mild vascular congestion is noted. New right-sided pleural effusion is noted with underlying atelectasis/infiltrate. Left lung remains clear. No pneumothorax. HEART AND MEDIASTINUM: Cardiomegaly. Aortic atherosclerosis. BONES AND SOFT TISSUES: No acute osseous abnormality. IMPRESSION: 1. New right-sided pleural effusion with underlying atelectasis or infiltrate. Electronically signed by: Oneil Devonshire MD MD 03/18/2024 04:00 AM EST RP Workstation: [id number]      Subjective:   Discharge Exam: Vitals:   03/21/24 0427 03/21/24 0854  BP: (!) 143/62 133/84  Pulse: 74 78  Resp: 19 20  Temp: 98.2 F (36.8 C) 98.6 F (37 C)  SpO2: 96% 99%   Vitals:   03/20/24 2100 03/21/24 0100 03/21/24 0427 03/21/24 0854  BP: 139/69 (!) 133/56  (!) 143/62 133/84  Pulse: 77 78 74 78  Resp: 18 18 19 20   Temp: 97.9 F (36.6 C) 98.3 F (36.8 C) 98.2 F (36.8 C) 98.6 F (37 C)  TempSrc: Oral Oral Oral Oral  SpO2: 99% 97% 96% 99%  Weight:   71.2 kg   Height:        General: Pt is alert, awake, not in acute distress Cardiovascular: RRR, S1/S2 +, no rubs, no gallops Respiratory: CTA bilaterally, no wheezing, no rhonchi Abdominal: Soft, NT, ND, bowel sounds + Extremities: no edema, no cyanosis    The results of significant diagnostics from this hospitalization (including imaging, microbiology, ancillary and laboratory) are listed below for reference.     Microbiology: Recent Results (from the past 240 hours)  Blood culture (routine x 2)     Status: None (Preliminary result)   Collection Time: 03/18/24  4:36 AM   Specimen: BLOOD  Result Value Ref Range Status   Specimen Description BLOOD RIGHT ANTECUBITAL  Final   Special Requests   Final    BOTTLES DRAWN AEROBIC AND ANAEROBIC Blood Culture results may not be optimal due to an inadequate volume of blood received in culture bottles   Culture   Final    NO GROWTH 3 DAYS Performed at [facility], [address] [place], [place] [address]    Report Status PENDING  Incomplete  Blood culture (routine x 2)     Status: None (Preliminary result)   Collection Time: 03/18/24  4:56 AM   Specimen: BLOOD  Result Value Ref Range Status   Specimen Description BLOOD RIGHT ANTECUBITAL  Final   Special Requests   Final    BOTTLES DRAWN AEROBIC AND ANAEROBIC Blood Culture adequate volume   Culture   Final    NO GROWTH 3 DAYS Performed at [facility], [address], [place], [place] [address]    Report Status PENDING  Incomplete  Culture, body fluid w Gram Stain-bottle     Status: None (Preliminary result)   Collection Time: 03/19/24  9:11 AM  Specimen: Pleura  Result Value Ref Range Status   Specimen Description PLEURAL  Final   Special Requests LUNG,RIGHT  Final    Culture   Final    NO GROWTH 2 DAYS Performed at [facility], [address] [place], [place] [address]    Report Status PENDING  Incomplete  Gram stain     Status: None   Collection Time: 03/19/24  9:11 AM   Specimen: Pleura  Result Value Ref Range Status   Specimen Description PLEURAL  Final   Special Requests LUNG,RIGHT  Final   Gram Stain   Final    FEW WBC PRESENT, PREDOMINANTLY MONONUCLEAR NO ORGANISMS SEEN Performed at [facility], [address] [place], [place] [address]    Report Status 03/19/2024 FINAL  Final     Labs: BNP (last 3 results) No results for input(s): BNP in the last 8760 hours. Basic Metabolic Panel: Recent Labs  Lab 03/18/24 0342 03/18/24 0346 03/19/24 0541 03/20/24 0837 03/21/24 0322  NA 141 141 138 142 138  K 3.9 3.7 3.9 3.8 3.8  CL 102 98 99 101 98  CO2 29  --  30 34* 31  GLUCOSE 125* 124* 111* 93 95  BUN 28* 30* 24* 22 25*  CREATININE 1.34* 1.50* 1.25* 1.27* 1.28*  CALCIUM  9.1  --  8.9 8.2* 8.6*   Liver Function Tests: No results for input(s): AST, ALT, ALKPHOS, BILITOT, PROT, ALBUMIN in the last 168 hours. No results for input(s): LIPASE, AMYLASE in the last 168 hours. No results for input(s): AMMONIA in the last 168 hours. CBC: Recent Labs  Lab 03/18/24 0342 03/18/24 0346 03/19/24 0541 03/20/24 0837 03/21/24 0322  WBC 10.2  --  9.0 8.3 8.0  NEUTROABS 7.3  --   --   --   --   HGB 12.5 13.9 12.5 11.3* 11.1*  HCT 40.3 41.0 40.4 35.9* 35.3*  MCV 93.1  --  92.9 91.6 91.5  PLT 214  --  208 201 198   Cardiac Enzymes: No results for input(s): CKTOTAL, CKMB, CKMBINDEX, TROPONINI in the last 168 hours. BNP: Invalid input(s): POCBNP CBG: No results for input(s): GLUCAP in the last 168 hours. D-Dimer No results for input(s): DDIMER in the last 72 hours. Hgb A1c No results for input(s): HGBA1C in the last 72 hours. Lipid Profile No results for input(s): CHOL, HDL,  LDLCALC, TRIG, CHOLHDL, LDLDIRECT in the last 72 hours. Thyroid  function studies No results for input(s): TSH, T4TOTAL, T3FREE, THYROIDAB in the last 72 hours.  Invalid input(s): FREET3 Anemia work up No results for input(s): VITAMINB12, FOLATE, FERRITIN, TIBC, IRON, RETICCTPCT in the last 72 hours. Urinalysis    Component Value Date/Time   COLORURINE YELLOW 01/11/2019 1800   APPEARANCEUR CLEAR 01/11/2019 1800   LABSPEC <1.005 (L) 01/11/2019 1800   PHURINE 7.5 01/11/2019 1800   GLUCOSEU NEGATIVE 01/11/2019 1800   HGBUR NEGATIVE 01/11/2019 1800   BILIRUBINUR NEGATIVE 01/11/2019 1800   KETONESUR NEGATIVE 01/11/2019 1800   PROTEINUR NEGATIVE 01/11/2019 1800   UROBILINOGEN 0.2 05/13/2012 0136   NITRITE NEGATIVE 01/11/2019 1800   LEUKOCYTESUR TRACE (A) 01/11/2019 1800   Sepsis Labs Recent Labs  Lab 03/18/24 0342 03/19/24 0541 03/20/24 0837 03/21/24 0322  WBC 10.2 9.0 8.3 8.0   Microbiology Recent Results (from the past 240 hours)  Blood culture (routine x 2)     Status: None (Preliminary result)   Collection Time: 03/18/24  4:36 AM   Specimen: BLOOD  Result Value Ref  Range Status   Specimen Description BLOOD RIGHT ANTECUBITAL  Final   Special Requests   Final    BOTTLES DRAWN AEROBIC AND ANAEROBIC Blood Culture results may not be optimal due to an inadequate volume of blood received in culture bottles   Culture   Final    NO GROWTH 3 DAYS Performed at [facility], [address], [place], [place] [address]    Report Status PENDING  Incomplete  Blood culture (routine x 2)     Status: None (Preliminary result)   Collection Time: 03/18/24  4:56 AM   Specimen: BLOOD  Result Value Ref Range Status   Specimen Description BLOOD RIGHT ANTECUBITAL  Final   Special Requests   Final    BOTTLES DRAWN AEROBIC AND ANAEROBIC Blood Culture adequate volume   Culture   Final    NO GROWTH 3 DAYS Performed at [facility], [address]  [place], [place] [address]    Report Status PENDING  Incomplete  Culture, body fluid w Gram Stain-bottle     Status: None (Preliminary result)   Collection Time: 03/19/24  9:11 AM   Specimen: Pleura  Result Value Ref Range Status   Specimen Description PLEURAL  Final   Special Requests LUNG,RIGHT  Final   Culture   Final    NO GROWTH 2 DAYS Performed at [facility], [address] [place], [place] [address]    Report Status PENDING  Incomplete  Gram stain     Status: None   Collection Time: 03/19/24  9:11 AM   Specimen: Pleura  Result Value Ref Range Status   Specimen Description PLEURAL  Final   Special Requests LUNG,RIGHT  Final   Gram Stain   Final    FEW WBC PRESENT, PREDOMINANTLY MONONUCLEAR NO ORGANISMS SEEN Performed at [facility], [address], [place], [place] [address]    Report Status 03/19/2024 FINAL  Final    Please note: You were cared for by a hospitalist during your hospital stay. Once you are discharged, your primary care physician will handle any further medical issues. Please note that NO REFILLS for any discharge medications will be authorized once you are discharged, as it is imperative that you return to your primary care physician (or establish a relationship with a primary care physician if you do not have one) for your post hospital discharge needs so that they can reassess your need for medications and monitor your lab values.    Time coordinating discharge: 40 minutes  SIGNED:   Ivonne Mustache, MD  [facility] 03/21/2024, 10:39 AM Pager [phone]  If 7PM-7AM, please contact night-coverage [email or web address] Password [email or web address]     [1]  Allergies Allergen Reactions   Ace Inhibitors Cough   Ventolin  [Albuterol ] Palpitations and Other (See Comments)    Triggers A-Fib    Oysters [Shellfish Allergy] Nausea And Vomiting   Sulfa Antibiotics Rash  [2]  Allergies Allergen Reactions   Ace Inhibitors Cough   Ventolin  [Albuterol ]  Palpitations and Other (See Comments)    Triggers A-Fib    Oysters [Shellfish Allergy] Nausea And Vomiting   Sulfa Antibiotics Rash

## 2024-03-21 ENCOUNTER — Other Ambulatory Visit (HOSPITAL_COMMUNITY): Payer: Self-pay

## 2024-03-21 LAB — CBC
HCT: 35.3 % — ABNORMAL LOW (ref 36.0–46.0)
Hemoglobin: 11.1 g/dL — ABNORMAL LOW (ref 12.0–15.0)
MCH: 28.8 pg (ref 26.0–34.0)
MCHC: 31.4 g/dL (ref 30.0–36.0)
MCV: 91.5 fL (ref 80.0–100.0)
Platelets: 198 K/uL (ref 150–400)
RBC: 3.86 MIL/uL — ABNORMAL LOW (ref 3.87–5.11)
RDW: 17.1 % — ABNORMAL HIGH (ref 11.5–15.5)
WBC: 8 K/uL (ref 4.0–10.5)
nRBC: 0 % (ref 0.0–0.2)

## 2024-03-21 LAB — BASIC METABOLIC PANEL WITH GFR
Anion gap: 9 (ref 5–15)
BUN: 25 mg/dL — ABNORMAL HIGH (ref 8–23)
CO2: 31 mmol/L (ref 22–32)
Calcium: 8.6 mg/dL — ABNORMAL LOW (ref 8.9–10.3)
Chloride: 98 mmol/L (ref 98–111)
Creatinine, Ser: 1.28 mg/dL — ABNORMAL HIGH (ref 0.44–1.00)
GFR, Estimated: 39 mL/min — ABNORMAL LOW
Glucose, Bld: 95 mg/dL (ref 70–99)
Potassium: 3.8 mmol/L (ref 3.5–5.1)
Sodium: 138 mmol/L (ref 135–145)

## 2024-03-21 LAB — PROTIME-INR
INR: 2.4 — ABNORMAL HIGH (ref 0.8–1.2)
Prothrombin Time: 27.4 s — ABNORMAL HIGH (ref 11.4–15.2)

## 2024-03-21 NOTE — TOC Transition Note (Signed)
 Transition of Care [facility]) - Discharge Note   Patient Details  Name: Lynn Dunn MRN: [id number] Date of Birth: March 23, 1930  Transition [facility]) CM/SW Contact:  Isaiah Public, LCSWA Phone Number: 03/21/2024, 11:32 AM   Clinical Narrative:     Patient will DC to: [facility]  Anticipated DC date: 03/21/2024  Family notified: Beverley  Transport by: [facility]  ?  Per MD patient ready for DC to [facility] . RN, patient, patient's family, and facility notified of DC. Discharge Summary sent to facility. RN given number for report [phone] RM# [id number]. DC packet on chart. Ambulance transport requested for patient.  CSW signing off.   Final next level of care: Skilled Nursing Facility Barriers to Discharge: No Barriers Identified   Patient Goals and CMS Choice Patient states their goals for this hospitalization and ongoing recovery are:: to [facility] for short term rehab   Choice offered to / list presented to : Patient      Discharge Placement              Patient chooses bed at: [facility] Patient to be transferred to facility by: [facility] Name of family member notified: Beverley Patient and family notified of of transfer: 03/21/24  Discharge Plan and Services Additional resources added to the After Visit Summary for   In-house Referral: Clinical Social Work                                   Social Drivers of Health (SDOH) Interventions SDOH Screenings   Food Insecurity: No Food Insecurity (03/19/2024)  Housing: Low Risk (03/19/2024)  Transportation Needs: No Transportation Needs (03/19/2024)  Utilities: Not At Risk (03/19/2024)  Financial Resource Strain: Low Risk (03/17/2024)   Received from [facility]  Social Connections: Moderately Isolated (03/19/2024)  Stress: Patient Declined (03/12/2024)   Received from [facility]  Tobacco Use: Low Risk (03/18/2024)     Readmission Risk Interventions     No data to display

## 2024-03-21 NOTE — Care Management Important Message (Signed)
 Important Message  Patient Details  Name: MANREET KIERNAN MRN: [id number] Date of Birth: 08/07/1930   Important Message Given:  Yes - Medicare IM     Vonzell Arrie Sharps 03/21/2024, 10:42 AM

## 2024-03-21 NOTE — TOC Progression Note (Signed)
 Transition of Care [facility]) - Progression Note    Patient Details  Name: Lynn Dunn MRN: [id number] Date of Birth: Mar 27, 1930  Transition of Care [facility]) CM/SW Contact  Isaiah Public, LCSWA Phone Number: 03/21/2024, 11:11 AM  Clinical Narrative:     Bard with [facility] confirmed facility can accept patient today if medically ready. CSW informed MD.  Expected Discharge Plan: Skilled Nursing Facility Barriers to Discharge: Continued Medical Work up               Expected Discharge Plan and Services In-house Referral: Clinical Social Work     Living arrangements for the past 2 months:  (patient was at short term rehab but is IDL at [facility]) Expected Discharge Date: 03/21/24                                     Social Drivers of Health (SDOH) Interventions SDOH Screenings   Food Insecurity: No Food Insecurity (03/19/2024)  Housing: Low Risk (03/19/2024)  Transportation Needs: No Transportation Needs (03/19/2024)  Utilities: Not At Risk (03/19/2024)  Financial Resource Strain: Low Risk (03/17/2024)   Received from [facility]  Social Connections: Moderately Isolated (03/19/2024)  Stress: Patient Declined (03/12/2024)   Received from [facility]  Tobacco Use: Low Risk (03/18/2024)    Readmission Risk Interventions     No data to display

## 2024-03-21 NOTE — Plan of Care (Signed)

## 2024-03-21 NOTE — Progress Notes (Signed)
 PHARMACY - ANTICOAGULATION CONSULT NOTE  Pharmacy Consult for warfarin Indication: atrial fibrillation  Allergies[1]  Patient Measurements: Height: 5' (152.4 cm) Weight: 71.2 kg (156 lb 14.4 oz) IBW/kg (Calculated) : 45.5 HEPARIN  DW (KG): 62  Vital Signs: Temp: 98.2 F (36.8 C) (01/16 0427) Temp Source: Oral (01/16 0427) BP: 143/62 (01/16 0427) Pulse Rate: 74 (01/16 0427)  Labs: Recent Labs    03/19/24 0541 03/20/24 0837 03/21/24 0322  HGB 12.5 11.3* 11.1*  HCT 40.4 35.9* 35.3*  PLT 208 201 198  LABPROT 30.4* 28.9* 27.4*  INR 2.7* 2.6* 2.4*  CREATININE 1.25* 1.27* 1.28*    Estimated Creatinine Clearance: 25.2 mL/min (A) (by C-G formula based on SCr of 1.28 mg/dL (H)).   Medical History: Past Medical History:  Diagnosis Date   Airway malacia    Anemia    Anginal pain 2011   had to take nitro    Arthritis    COPD (chronic obstructive pulmonary disease) (HCC)    Dysuria    GERD (gastroesophageal reflux disease)    Hematuria    HTN (hypertension)    Hx of echocardiogram    Echo (12/15):  Mild LVH, EF 60-65%, mild MR, mod LAE (LA 49 mm), PASP 43 mmHg   Obesity    OSA (obstructive sleep apnea)    cpap at home   PAF (paroxysmal atrial fibrillation) (HCC)    s/p ablation 07/29/09   Sick sinus syndrome (HCC)    pauses with syncope   Stroke (HCC) 6/09   NO RESIDUAL PROBLEMS   Assessment: 89 yoF admitted with pleural effusion s/p thoracentesis. Pt on warfarin PTA for AF, held initially now resumed 1/14 post-thora.  INR today remains therapeutic at 2.4 on home regimen.  Goal of Therapy:  INR 2-3 Monitor platelets by anticoagulation protocol: Yes   Plan:  Warfarin 3mg  daily as PTA Daily Protime Monitor s/s bleeding    Ozell Jamaica, PharmD, BCPS, [facility] Clinical Pharmacist [phone] Please check AMION for all [facility] Pharmacy numbers 03/21/2024        [1]  Allergies Allergen Reactions   Ace Inhibitors Cough   Ventolin  [Albuterol ] Palpitations and  Other (See Comments)    Triggers A-Fib    Oysters [Shellfish Allergy] Nausea And Vomiting   Sulfa Antibiotics Rash

## 2024-03-21 NOTE — Progress Notes (Signed)
 Patient seen and examined at bedside today.  Comfortably sitting on the chair.  Heart rate/blood pressure well-controlled.  She looks comfortable.  Medically stable for discharge to SNF today.  Discharge orders and summary are already in place.  No change in the medical management.

## 2024-03-23 LAB — CULTURE, BLOOD (ROUTINE X 2)
Culture: NO GROWTH
Culture: NO GROWTH
Special Requests: ADEQUATE

## 2024-03-24 ENCOUNTER — Other Ambulatory Visit (HOSPITAL_COMMUNITY): Payer: Self-pay

## 2024-03-24 LAB — CULTURE, BODY FLUID W GRAM STAIN -BOTTLE: Culture: NO GROWTH
